# Patient Record
Sex: Female | Born: 1940 | Race: White | Hispanic: No | Marital: Married | Smoking: Never smoker
Health system: Southern US, Community
[De-identification: ages and names within clinical notes are randomized; demographics above are authoritative.]

## PROBLEM LIST (undated history)

## (undated) DIAGNOSIS — I639 Cerebral infarction, unspecified: Secondary | ICD-10-CM

## (undated) DIAGNOSIS — R011 Cardiac murmur, unspecified: Secondary | ICD-10-CM

## (undated) DIAGNOSIS — I2510 Atherosclerotic heart disease of native coronary artery without angina pectoris: Secondary | ICD-10-CM

## (undated) DIAGNOSIS — R0602 Shortness of breath: Secondary | ICD-10-CM

## (undated) DIAGNOSIS — I350 Nonrheumatic aortic (valve) stenosis: Secondary | ICD-10-CM

## (undated) DIAGNOSIS — R2243 Localized swelling, mass and lump, lower limb, bilateral: Secondary | ICD-10-CM

## (undated) DIAGNOSIS — I480 Paroxysmal atrial fibrillation: Secondary | ICD-10-CM

## (undated) DIAGNOSIS — R600 Localized edema: Secondary | ICD-10-CM

## (undated) DIAGNOSIS — R0600 Dyspnea, unspecified: Secondary | ICD-10-CM

## (undated) DIAGNOSIS — I10 Essential (primary) hypertension: Secondary | ICD-10-CM

## (undated) DIAGNOSIS — I447 Left bundle-branch block, unspecified: Secondary | ICD-10-CM

## (undated) DIAGNOSIS — Z9889 Other specified postprocedural states: Secondary | ICD-10-CM

## (undated) DIAGNOSIS — R001 Bradycardia, unspecified: Secondary | ICD-10-CM

## (undated) DIAGNOSIS — R9431 Abnormal electrocardiogram [ECG] [EKG]: Secondary | ICD-10-CM

## (undated) DIAGNOSIS — J309 Allergic rhinitis, unspecified: Secondary | ICD-10-CM

## (undated) DIAGNOSIS — R42 Dizziness and giddiness: Secondary | ICD-10-CM

## (undated) DIAGNOSIS — Z8719 Personal history of other diseases of the digestive system: Secondary | ICD-10-CM

## (undated) DIAGNOSIS — N1832 Chronic kidney disease, stage 3b: Secondary | ICD-10-CM

## (undated) DIAGNOSIS — E039 Hypothyroidism, unspecified: Secondary | ICD-10-CM

## (undated) DIAGNOSIS — I5032 Chronic diastolic (congestive) heart failure: Secondary | ICD-10-CM

## (undated) DIAGNOSIS — R112 Nausea with vomiting, unspecified: Secondary | ICD-10-CM

## (undated) DIAGNOSIS — G90529 Complex regional pain syndrome I of unspecified lower limb: Secondary | ICD-10-CM

## (undated) DIAGNOSIS — E785 Hyperlipidemia, unspecified: Secondary | ICD-10-CM

## (undated) HISTORY — DX: Localized edema: R60.0

## (undated) HISTORY — DX: Essential (primary) hypertension: I10

## (undated) HISTORY — DX: Paroxysmal atrial fibrillation: I48.0

## (undated) HISTORY — DX: Hypothyroidism, unspecified: E03.9

## (undated) HISTORY — DX: Shortness of breath: R06.02

## (undated) HISTORY — DX: Complex regional pain syndrome i of unspecified lower limb: G90.529

## (undated) HISTORY — DX: Abnormal electrocardiogram (ECG) (EKG): R94.31

## (undated) HISTORY — DX: Hyperlipidemia, unspecified: E78.5

## (undated) HISTORY — DX: Nonrheumatic aortic (valve) stenosis: I35.0

## (undated) HISTORY — DX: Localized swelling, mass and lump, lower limb, bilateral: R22.43

## (undated) HISTORY — DX: Allergic rhinitis, unspecified: J30.9

## (undated) HISTORY — DX: Dizziness and giddiness: R42

## (undated) HISTORY — DX: Cardiac murmur, unspecified: R01.1

---

## 2021-05-07 DIAGNOSIS — I340 Nonrheumatic mitral (valve) insufficiency: Secondary | ICD-10-CM

## 2021-05-07 DIAGNOSIS — I361 Nonrheumatic tricuspid (valve) insufficiency: Secondary | ICD-10-CM

## 2021-10-09 ENCOUNTER — Other Ambulatory Visit: Payer: Self-pay

## 2021-10-09 DIAGNOSIS — R011 Cardiac murmur, unspecified: Secondary | ICD-10-CM | POA: Insufficient documentation

## 2021-10-09 DIAGNOSIS — E039 Hypothyroidism, unspecified: Secondary | ICD-10-CM | POA: Insufficient documentation

## 2021-10-09 DIAGNOSIS — R42 Dizziness and giddiness: Secondary | ICD-10-CM | POA: Insufficient documentation

## 2021-10-09 DIAGNOSIS — I480 Paroxysmal atrial fibrillation: Secondary | ICD-10-CM | POA: Insufficient documentation

## 2021-10-09 DIAGNOSIS — R2243 Localized swelling, mass and lump, lower limb, bilateral: Secondary | ICD-10-CM | POA: Insufficient documentation

## 2021-10-09 DIAGNOSIS — J309 Allergic rhinitis, unspecified: Secondary | ICD-10-CM | POA: Insufficient documentation

## 2021-10-09 DIAGNOSIS — R0602 Shortness of breath: Secondary | ICD-10-CM | POA: Insufficient documentation

## 2021-10-09 DIAGNOSIS — G90529 Complex regional pain syndrome I of unspecified lower limb: Secondary | ICD-10-CM | POA: Insufficient documentation

## 2021-10-09 DIAGNOSIS — E785 Hyperlipidemia, unspecified: Secondary | ICD-10-CM | POA: Insufficient documentation

## 2021-10-09 DIAGNOSIS — R600 Localized edema: Secondary | ICD-10-CM | POA: Insufficient documentation

## 2021-10-09 DIAGNOSIS — R9431 Abnormal electrocardiogram [ECG] [EKG]: Secondary | ICD-10-CM | POA: Insufficient documentation

## 2021-10-09 DIAGNOSIS — I10 Essential (primary) hypertension: Secondary | ICD-10-CM | POA: Insufficient documentation

## 2021-10-13 ENCOUNTER — Encounter: Payer: Self-pay | Admitting: Cardiology

## 2021-10-13 ENCOUNTER — Ambulatory Visit: Payer: Medicare PPO | Admitting: Cardiology

## 2021-10-13 VITALS — BP 222/84 | HR 56 | Ht 63.0 in | Wt 195.8 lb

## 2021-10-13 DIAGNOSIS — I10 Essential (primary) hypertension: Secondary | ICD-10-CM | POA: Diagnosis not present

## 2021-10-13 DIAGNOSIS — I350 Nonrheumatic aortic (valve) stenosis: Secondary | ICD-10-CM | POA: Insufficient documentation

## 2021-10-13 DIAGNOSIS — I480 Paroxysmal atrial fibrillation: Secondary | ICD-10-CM

## 2021-10-13 DIAGNOSIS — R0609 Other forms of dyspnea: Secondary | ICD-10-CM | POA: Insufficient documentation

## 2021-10-13 NOTE — Progress Notes (Signed)
?Cardiology Office Note:   ? ?Date:  10/13/2021  ? ?ID:  Rebekah Pope, DOB 1941-05-11, MRN [id number] ? ?PCP:  Dema Severin, NP  ?Cardiologist:  Garwin Brothers, MD  ? ?Referring MD: Eber Jones, NP  ? ? ?ASSESSMENT:   ? ?1. Essential (primary) hypertension   ?2. Paroxysmal atrial fibrillation (HCC)   ?3. Severe aortic stenosis   ?4. Dyspnea on exertion   ? ?PLAN:   ? ?In order of problems listed above: ? ?Primary prevention stressed with patient.  Importance of compliance with diet medication stressed and she vocalized understanding. ?Paroxysmal atrial fibrillation:I discussed with the patient atrial fibrillation, disease process. Management and therapy including rate and rhythm control, anticoagulation benefits and potential risks were discussed extensively with the patient. Patient had multiple questions which were answered to patient's satisfaction.  Patient is on amiodarone therapy.  Benefits and potential risks explained.  I will not change any of that medication at this time because of the fact that if the patient has any atrial fibrillation issues that will be devastating and the patient with significant aortic stenosis as mentioned below. ?Severe aortic stenosis by echocardiogram done last year.  I will recheck this.  We will also then set the patient for valvular heart disease clinic to be assessed for potential aortic valve replacement by either TAVR or conventional method. ?Essential hypertension: Blood pressure stable.  She tells me that she has an element of whitecoat hypertension that her blood pressure at home is fine.  She keeps a track of it regularly and mentioned to me the numbers. ?She will be seen in follow-up appointment after echocardiogram and we will do the needful.  Patient had multiple questions which were answered to her satisfaction.6 ? ?Medication Adjustments/Labs and Tests Ordered: ?Current medicines are reviewed at length with the patient today.  Concerns regarding  medicines are outlined above.  ?No orders of the defined types were placed in this encounter. ? ?No orders of the defined types were placed in this encounter. ? ? ? ?History of Present Illness:   ? ?Rebekah Pope is a 81 y.o. female who is being seen today for the evaluation of dyspnea on exertion, paroxysmal atrial fibrillation and severe aortic stenosis at the request of Eber Jones, NP.  Patient is a pleasant 81 year old female.  She has past medical history of paroxysmal atrial fibrillation and severe aortic stenosis.  She mentions to me that she has no chest pain or dizziness or syncope.  However when she exerts she has shortness of breath.  No orthopnea or PND.  She is here to be established.  She was lost to cardiology follow-up.  At the time of my evaluation, the patient is alert awake oriented and in no distress. ? ?Past Medical History:  ?Diagnosis Date  ? Abnormal electrocardiogram (ECG) (EKG)   ? Allergic rhinitis   ? Cardiac murmur   ? Complex regional pain syndrome i of unspecified lower limb   ? Dizziness and giddiness   ? Essential (primary) hypertension   ? Hyperlipidemia   ? Hypothyroidism   ? Localized edema   ? Localized swelling, mass and lump, lower limb, bilateral   ? Paroxysmal atrial fibrillation (HCC)   ? Shortness of breath   ? ? ?Past Surgical History:  ?Procedure Laterality Date  ? CESAREAN SECTION    ? ? ?Current Medications: ?Current Meds  ?Medication Sig  ? amiodarone (PACERONE) 200 MG tablet Take by mouth. Take 0.5 tablet (100 mg)  by mouth one time daily  ? furosemide (LASIX) 20 MG tablet Take 20 mg by mouth daily.  ? KLOR-CON M10 10 MEQ tablet Take 10 mEq by mouth daily.  ? levothyroxine (SYNTHROID) 112 MCG tablet Take 112 mcg by mouth daily.  ? rivaroxaban (XARELTO) 20 MG TABS tablet Take 20 mg by mouth daily with supper.  ? valsartan (DIOVAN) 40 MG tablet Take 40 mg by mouth daily.  ?  ? ?Allergies:   Patient has no known allergies.  ? ?Social History  ? ?Socioeconomic  History  ? Marital status: Married  ?  Spouse name: Not on file  ? Number of children: Not on file  ? Years of education: Not on file  ? Highest education level: Not on file  ?Occupational History  ? Not on file  ?Tobacco Use  ? Smoking status: Never  ?  Passive exposure: Past  ? Smokeless tobacco: Never  ?Vaping Use  ? Vaping Use: Never used  ?Substance and Sexual Activity  ? Alcohol use: Not on file  ? Drug use: Never  ? Sexual activity: Not on file  ?Other Topics Concern  ? Not on file  ?Social History Narrative  ? Not on file  ? ?Social Determinants of Health  ? ?Financial Resource Strain: Not on file  ?Food Insecurity: Not on file  ?Transportation Needs: Not on file  ?Physical Activity: Not on file  ?Stress: Not on file  ?Social Connections: Not on file  ?  ? ?Family History: ?The patient's family history includes Dementia in her mother; Heart disease in her father; Hip fracture in her mother; Pneumonia in her mother. ? ?ROS:   ?Please see the history of present illness.    ?All other systems reviewed and are negative. ? ?EKGs/Labs/Other Studies Reviewed:   ? ?The following studies were reviewed today: ?EKG reveals sinus rhythm and nonspecific ST-T changes ? ? ?Recent Labs: ?No results found for requested labs within last 8760 hours.  ?Recent Lipid Panel ?No results found for: CHOL, TRIG, HDL, CHOLHDL, VLDL, LDLCALC, LDLDIRECT ? ?Physical Exam:   ? ?VS:  BP (!) 222/84 (BP Location: Right Arm, Patient Position: Sitting)   Pulse (!) 56   Ht 5\' 3"  (1.6 m)   Wt 195 lb 12.8 oz (88.8 kg)   SpO2 97%   BMI 34.68 kg/m?    ? ?Wt Readings from Last 3 Encounters:  ?10/13/21 195 lb 12.8 oz (88.8 kg)  ?  ? ?GEN: Patient is in no acute distress ?HEENT: Normal ?NECK: No JVD; No carotid bruits ?LYMPHATICS: No lymphadenopathy ?CARDIAC: S1 S2 regular, 2/6 systolic murmur at the apex. ?RESPIRATORY:  Clear to auscultation without rales, wheezing or rhonchi  ?ABDOMEN: Soft, non-tender, non-distended ?MUSCULOSKELETAL:  No edema;  No deformity  ?SKIN: Warm and dry ?NEUROLOGIC:  Alert and oriented x 3 ?PSYCHIATRIC:  Normal affect  ? ? ?Signed, ?12/13/21, MD  ?10/13/2021 3:24 PM    ? [facility]   ?

## 2021-10-13 NOTE — Patient Instructions (Signed)
Medication Instructions:  ?Your physician recommends that you continue on your current medications as directed. Please refer to the Current Medication list given to you today. ? ?*If you need a refill on your cardiac medications before your next appointment, please call your pharmacy* ? ? ?Lab Work: ?None ordered ?If you have labs (blood work) drawn today and your tests are completely normal, you will receive your results only by: ?MyChart Message (if you have MyChart) OR ?A paper copy in the mail ?If you have any lab test that is abnormal or we need to change your treatment, we will call you to review the results. ? ? ?Testing/Procedures: ?Your physician has requested that you have an echocardiogram. Echocardiography is a painless test that uses sound waves to create images of your heart. It provides your doctor with information about the size and shape of your heart and how well your heart?s chambers and valves are working. This procedure takes approximately one hour. There are no restrictions for this procedure. ? ? ? ?Follow-Up: ?At [facility], you and your health needs are our priority.  As part of our continuing mission to provide you with exceptional heart care, we have created designated Provider Care Teams.  These Care Teams include your primary Cardiologist (physician) and Advanced Practice Providers (APPs -  Physician Assistants and Nurse Practitioners) who all work together to provide you with the care you need, when you need it. ? ?We recommend signing up for the patient portal called "MyChart".  Sign up information is provided on this After Visit Summary.  MyChart is used to connect with patients for Virtual Visits (Telemedicine).  Patients are able to view lab/test results, encounter notes, upcoming appointments, etc.  Non-urgent messages can be sent to your provider as well.   ?To learn more about what you can do with MyChart, go to [email or web address].   ? ?Your next appointment:   ?As  directed ? ?The format for your next appointment:   ?In Person ? ?Provider:   ?Jyl Heinz, MD ? ? ?Other Instructions ?Echocardiogram ?An echocardiogram is a test that uses sound waves (ultrasound) to produce images of the heart. ?Images from an echocardiogram can provide important information about: ?Heart size and shape. ?The size and thickness and movement of your heart's walls. ?Heart muscle function and strength. ?Heart valve function or if you have stenosis. Stenosis is when the heart valves are too narrow. ?If blood is flowing backward through the heart valves (regurgitation). ?A tumor or infectious growth around the heart valves. ?Areas of heart muscle that are not working well because of poor blood flow or injury from a heart attack. ?Aneurysm detection. An aneurysm is a weak or damaged part of an artery wall. The wall bulges out from the normal force of blood pumping through the body. ?Tell a health care provider about: ?Any allergies you have. ?All medicines you are taking, including vitamins, herbs, eye drops, creams, and over-the-counter medicines. ?Any blood disorders you have. ?Any surgeries you have had. ?Any medical conditions you have. ?Whether you are pregnant or may be pregnant. ?What are the risks? ?Generally, this is a safe test. However, problems may occur, including an allergic reaction to dye (contrast) that may be used during the test. ?What happens before the test? ?No specific preparation is needed. You may eat and drink normally. ?What happens during the test? ?You will take off your clothes from the waist up and put on a hospital gown. ?Electrodes or electrocardiogram (ECG)patches may be placed on  your chest. The electrodes or patches are then connected to a device that monitors your heart rate and rhythm. ?You will lie down on a table for an ultrasound exam. A gel will be applied to your chest to help sound waves pass through your skin. ?A handheld device, called a transducer, will  be pressed against your chest and moved over your heart. The transducer produces sound waves that travel to your heart and bounce back (or "echo" back) to the transducer. These sound waves will be captured in real-time and changed into images of your heart that can be viewed on a video monitor. The images will be recorded on a computer and reviewed by your health care provider. ?You may be asked to change positions or hold your breath for a short time. This makes it easier to get different views or better views of your heart. ?In some cases, you may receive contrast through an IV in one of your veins. This can improve the quality of the pictures from your heart. ?The procedure may vary among health care providers and hospitals.   ?What can I expect after the test? ?You may return to your normal, everyday life, including diet, activities, and medicines, unless your health care provider tells you not to do that. ?Follow these instructions at home: ?It is up to you to get the results of your test. Ask your health care provider, or the department that is doing the test, when your results will be ready. ?Keep all follow-up visits. This is important. ?Summary ?An echocardiogram is a test that uses sound waves (ultrasound) to produce images of the heart. ?Images from an echocardiogram can provide important information about the size and shape of your heart, heart muscle function, heart valve function, and other possible heart problems. ?You do not need to do anything to prepare before this test. You may eat and drink normally. ?After the echocardiogram is completed, you may return to your normal, everyday life, unless your health care provider tells you not to do that. ?This information is not intended to replace advice given to you by your health care provider. Make sure you discuss any questions you have with your health care provider. ?Document Revised: 01/16/2020 Document Reviewed: 01/16/2020 ?Elsevier Patient  Education ? 2021 [employer] ? ? ?

## 2021-10-15 ENCOUNTER — Ambulatory Visit (INDEPENDENT_AMBULATORY_CARE_PROVIDER_SITE_OTHER): Payer: Medicare PPO

## 2021-10-15 DIAGNOSIS — I350 Nonrheumatic aortic (valve) stenosis: Secondary | ICD-10-CM | POA: Diagnosis not present

## 2021-10-15 LAB — ECHOCARDIOGRAM COMPLETE
AR max vel: 0.63 cm2
AV Area VTI: 0.52 cm2
AV Area mean vel: 0.61 cm2
AV Mean grad: 44 mmHg
AV Peak grad: 69.9 mmHg
Ao pk vel: 4.18 m/s
Area-P 1/2: 3.51 cm2
MV M vel: 6.44 m/s
MV Peak grad: 165.9 mmHg
MV VTI: 1.32 cm2
P 1/2 time: 438 msec
Radius: 0.65 cm
S' Lateral: 3.4 cm

## 2021-10-21 NOTE — Progress Notes (Signed)
? ? ?Structural Heart Clinic Consult Note ? ?Chief Complaint  ?Patient presents with  ? New Patient (Initial Visit)  ?  Severe aortic stenosis ?  ? ?History of Present Illness: 81 yo female with history of HTN, hyperlipidemia, hypothyroidism, paroxysmal atrial fibrillation, LBBB and severe aortic stenosis who is here today as a new consult in the structural heart clinic, referred by Dr. Tomie China, for further discussion regarding her aortic stenosis and possible TAVR. She is known to have PAF and is on amiodarone and Xarelto. She has had recent worsened dyspnea on exertion and was seen on 10/13/21 by Dr. Tomie China after being lost to cardiac follow up for several years. Echo 10/15/21 with LVEF=65-70%. Moderate LVH. Mild to moderate mitral regurgitation with mild mitral stenosis. Severe aortic stenosis with mean gradient 44 mmHg, AVA 0.5 cm2, DI 0.16.  ? ?She tells me today that she has been having progressive dyspnea on exertion and fatigue. No dizziness or syncope. Mild LE edema resolved with Lasix. No chest pain.  ?She lives in [place] with her husband. She has dentures with two natural teeth. No active issues. She is retired as a Geophysicist/field seismologist.  ? ? ?Primary Care Physician: Dema Severin, NP ?Primary Cardiologist: Revankar ?Referring Cardiologist: Revankar ? ?Past Medical History:  ?Diagnosis Date  ? Abnormal electrocardiogram (ECG) (EKG)   ? Allergic rhinitis   ? Cardiac murmur   ? Complex regional pain syndrome i of unspecified lower limb   ? Dizziness and giddiness   ? Essential (primary) hypertension   ? Hyperlipidemia   ? Hypothyroidism   ? Localized edema   ? Localized swelling, mass and lump, lower limb, bilateral   ? Paroxysmal atrial fibrillation (HCC)   ? Shortness of breath   ? ? ?Past Surgical History:  ?Procedure Laterality Date  ? CESAREAN SECTION    ? ? ?Current Outpatient Medications  ?Medication Sig Dispense Refill  ? amiodarone (PACERONE) 200 MG tablet Take by mouth. Take 0.5 tablet (100 mg)  by mouth one time daily    ? furosemide (LASIX) 20 MG tablet Take 20 mg by mouth daily.    ? KLOR-CON M10 10 MEQ tablet Take 10 mEq by mouth daily.    ? levothyroxine (SYNTHROID) 112 MCG tablet Take 112 mcg by mouth daily.    ? rivaroxaban (XARELTO) 20 MG TABS tablet Take 20 mg by mouth daily with supper.    ? valsartan (DIOVAN) 40 MG tablet Take 40 mg by mouth daily.    ? ?No current facility-administered medications for this visit.  ? ? ?No Known Allergies ? ?Social History  ? ?Socioeconomic History  ? Marital status: Married  ?  Spouse name: Not on file  ? Number of children: 4  ? Years of education: Not on file  ? Highest education level: Not on file  ?Occupational History  ? Occupation: Animator  ?Tobacco Use  ? Smoking status: Never  ?  Passive exposure: Past  ? Smokeless tobacco: Never  ?Vaping Use  ? Vaping Use: Never used  ?Substance and Sexual Activity  ? Alcohol use: Never  ? Drug use: Never  ? Sexual activity: Not on file  ?Other Topics Concern  ? Not on file  ?Social History Narrative  ? Not on file  ? ?Social Determinants of Health  ? ?Financial Resource Strain: Not on file  ?Food Insecurity: Not on file  ?Transportation Needs: Not on file  ?Physical Activity: Not on file  ?Stress: Not on file  ?Social Connections:  Not on file  ?Intimate Partner Violence: Not on file  ? ? ?Family History  ?Problem Relation Age of Onset  ? Dementia Mother   ? Pneumonia Mother   ? Hip fracture Mother   ? Heart disease Father   ? ? ?Review of Systems:  As stated in the HPI and otherwise negative.  ? ?BP (!) 170/70   Pulse 64   Ht 5\' 3"  (1.6 m)   Wt 192 lb 3.2 oz (87.2 kg)   SpO2 96%   BMI 34.05 kg/m?  ? ?Physical Examination: ?General: Well developed, well nourished, NAD  ?HEENT: OP clear, mucus membranes moist  ?SKIN: warm, dry. No rashes. ?Neuro: No focal deficits  ?Musculoskeletal: Muscle strength 5/5 all ext  ?Psychiatric: Mood and affect normal  ?Neck: No JVD, no carotid bruits, no thyromegaly,  no lymphadenopathy.  ?Lungs:Clear bilaterally, no wheezes, rhonci, crackles ?Cardiovascular: Regular rate and rhythm. Loud, harsh, late peaking systolic murmur.  ?Abdomen:Soft. Bowel sounds present. Non-tender.  ?Extremities: No lower extremity edema. Pulses are 2 + in the bilateral DP/PT. ? ?EKG:  EKG is not ordered today. ?The ekg from 10/13/21 is reviewed today and shows sinus with LBBB.  ? ?Echo 10/15/21: ? 1. Left ventricular ejection fraction, by estimation, is 65 to 70%. The  ?left ventricle has normal function. The left ventricle has no regional  ?wall motion abnormalities. There is moderate left ventricular hypertrophy.  ?Left ventricular diastolic  ?parameters are consistent with Grade II diastolic dysfunction  ?(pseudonormalization).  ? 2. Right ventricular systolic function is normal. The right ventricular  ?size is normal. There is mildly elevated pulmonary artery systolic  ?pressure.  ? 3. Left atrial size was severely dilated.  ? 4. Right atrial size was moderately dilated.  ? 5. The mitral valve is normal in structure. Mild to moderate mitral valve  ?regurgitation. Mild mitral stenosis. The mean mitral valve gradient is 4.0  ?mmHg.  ? 6. DI 0.16, SVi 40. The aortic valve is calcified. Aortic valve  ?regurgitation is mild. Severe aortic valve stenosis. Aortic valve area, by  ?VTI measures 0.52 cm?Marland Kitchen. Aortic valve mean gradient measures 44.0 mmHg.  ? 7. The inferior vena cava is normal in size with greater than 50%  ?respiratory variability, suggesting right atrial pressure of 3 mmHg.  ? ?FINDINGS  ? Left Ventricle: Left ventricular ejection fraction, by estimation, is 65  ?to 70%. The left ventricle has normal function. The left ventricle has no  ?regional wall motion abnormalities. The left ventricular internal cavity  ?size was normal in size. There is  ? moderate left ventricular hypertrophy. Left ventricular diastolic  ?parameters are consistent with Grade II diastolic dysfunction   ?(pseudonormalization).  ? ?Right Ventricle: The right ventricular size is normal. No increase in  ?right ventricular wall thickness. Right ventricular systolic function is  ?normal. There is mildly elevated pulmonary artery systolic pressure. The  ?tricuspid regurgitant velocity is 2.91  ? m/s, and with an assumed right atrial pressure of 8 mmHg, the estimated  ?right ventricular systolic pressure is 41.9 mmHg.  ? ?Left Atrium: Left atrial size was severely dilated.  ? ?Right Atrium: Right atrial size was moderately dilated.  ? ?Pericardium: There is no evidence of pericardial effusion.  ? ?Mitral Valve: The mitral valve is normal in structure. There is mild  ?thickening of the mitral valve leaflet(s). There is moderate calcification  ?of the mitral valve leaflet(s). Mild mitral annular calcification. Mild to  ?moderate mitral valve  ?regurgitation. Mild mitral valve stenosis. MV  peak gradient, 12.5 mmHg.  ?The mean mitral valve gradient is 4.0 mmHg.  ? ?Tricuspid Valve: The tricuspid valve is normal in structure. Tricuspid  ?valve regurgitation is not demonstrated. No evidence of tricuspid  ?stenosis.  ? ?Aortic Valve: DI 0.16, SVi 40. The aortic valve is calcified. Aortic valve  ?regurgitation is mild. Aortic regurgitation PHT measures 438 msec. Severe  ?aortic stenosis is present. Aortic valve mean gradient measures 44.0 mmHg.  ?Aortic valve peak gradient  ?measures 69.9 mmHg. Aortic valve area, by VTI measures 0.52 cm?.  ? ?Pulmonic Valve: The pulmonic valve was normal in structure. Pulmonic valve  ?regurgitation is not visualized. No evidence of pulmonic stenosis.  ? ?Aorta: The aortic root is normal in size and structure.  ? ?Venous: The inferior vena cava is normal in size with greater than 50%  ?respiratory variability, suggesting right atrial pressure of 3 mmHg.  ? ?IAS/Shunts: No atrial level shunt detected by color flow Doppler.  ? ?   ?LEFT VENTRICLE  ?PLAX 2D  ?LVIDd:         5.20 cm   Diastology   ?LVIDs:         3.40 cm   LV e' medial:    3.87 cm/s  ?LV PW:         1.50 cm   LV E/e' medial:  43.9  ?LV IVS:        1.50 cm   LV e' lateral:   4.75 cm/s  ?LVOT diam:     2.00 cm   LV E/e' lateral: 35.8  ?LV SV:         76  ?L

## 2021-10-21 NOTE — H&P (View-Only) (Signed)
Structural Heart Clinic Consult Note  Chief Complaint  Patient presents with   New Patient (Initial Visit)    Severe aortic stenosis    History of Present Illness: 81 yo female with history of HTN, hyperlipidemia, hypothyroidism, paroxysmal atrial fibrillation, LBBB and severe aortic stenosis who is here today as a new consult in the structural heart clinic, referred by Dr. Tomie China, for further discussion regarding her aortic stenosis and possible TAVR. She is known to have PAF and is on amiodarone and Xarelto. She has had recent worsened dyspnea on exertion and was seen on 10/13/21 by Dr. Tomie China after being lost to cardiac follow up for several years. Echo 10/15/21 with LVEF=65-70%. Moderate LVH. Mild to moderate mitral regurgitation with mild mitral stenosis. Severe aortic stenosis with mean gradient 44 mmHg, AVA 0.5 cm2, DI 0.16.   She tells me today that she has been having progressive dyspnea on exertion and fatigue. No dizziness or syncope. Mild LE edema resolved with Lasix. No chest pain.  She lives in [place] with her husband. She has dentures with two natural teeth. No active issues. She is retired as a Geophysicist/field seismologist.    Primary Care Physician: Dema Severin, NP Primary Cardiologist: Revankar Referring Cardiologist: Revankar  Past Medical History:  Diagnosis Date   Abnormal electrocardiogram (ECG) (EKG)    Allergic rhinitis    Cardiac murmur    Complex regional pain syndrome i of unspecified lower limb    Dizziness and giddiness    Essential (primary) hypertension    Hyperlipidemia    Hypothyroidism    Localized edema    Localized swelling, mass and lump, lower limb, bilateral    Paroxysmal atrial fibrillation (HCC)    Shortness of breath     Past Surgical History:  Procedure Laterality Date   CESAREAN SECTION      Current Outpatient Medications  Medication Sig Dispense Refill   amiodarone (PACERONE) 200 MG tablet Take by mouth. Take 0.5 tablet (100 mg)  by mouth one time daily     furosemide (LASIX) 20 MG tablet Take 20 mg by mouth daily.     KLOR-CON M10 10 MEQ tablet Take 10 mEq by mouth daily.     levothyroxine (SYNTHROID) 112 MCG tablet Take 112 mcg by mouth daily.     rivaroxaban (XARELTO) 20 MG TABS tablet Take 20 mg by mouth daily with supper.     valsartan (DIOVAN) 40 MG tablet Take 40 mg by mouth daily.     No current facility-administered medications for this visit.    No Known Allergies  Social History   Socioeconomic History   Marital status: Married    Spouse name: Not on file   Number of children: 4   Years of education: Not on file   Highest education level: Not on file  Occupational History   Occupation: Animator  Tobacco Use   Smoking status: Never    Passive exposure: Past   Smokeless tobacco: Never  Vaping Use   Vaping Use: Never used  Substance and Sexual Activity   Alcohol use: Never   Drug use: Never   Sexual activity: Not on file  Other Topics Concern   Not on file  Social History Narrative   Not on file   Social Determinants of Health   Financial Resource Strain: Not on file  Food Insecurity: Not on file  Transportation Needs: Not on file  Physical Activity: Not on file  Stress: Not on file  Social Connections:  Not on file  Intimate Partner Violence: Not on file    Family History  Problem Relation Age of Onset   Dementia Mother    Pneumonia Mother    Hip fracture Mother    Heart disease Father     Review of Systems:  As stated in the HPI and otherwise negative.   BP (!) 170/70   Pulse 64   Ht 5\' 3"  (1.6 m)   Wt 192 lb 3.2 oz (87.2 kg)   SpO2 96%   BMI 34.05 kg/m   Physical Examination: General: Well developed, well nourished, NAD  HEENT: OP clear, mucus membranes moist  SKIN: warm, dry. No rashes. Neuro: No focal deficits  Musculoskeletal: Muscle strength 5/5 all ext  Psychiatric: Mood and affect normal  Neck: No JVD, no carotid bruits, no thyromegaly,  no lymphadenopathy.  Lungs:Clear bilaterally, no wheezes, rhonci, crackles Cardiovascular: Regular rate and rhythm. Loud, harsh, late peaking systolic murmur.  Abdomen:Soft. Bowel sounds present. Non-tender.  Extremities: No lower extremity edema. Pulses are 2 + in the bilateral DP/PT.  EKG:  EKG is not ordered today. The ekg from 10/13/21 is reviewed today and shows sinus with LBBB.   Echo 10/15/21:  1. Left ventricular ejection fraction, by estimation, is 65 to 70%. The  left ventricle has normal function. The left ventricle has no regional  wall motion abnormalities. There is moderate left ventricular hypertrophy.  Left ventricular diastolic  parameters are consistent with Grade II diastolic dysfunction  (pseudonormalization).   2. Right ventricular systolic function is normal. The right ventricular  size is normal. There is mildly elevated pulmonary artery systolic  pressure.   3. Left atrial size was severely dilated.   4. Right atrial size was moderately dilated.   5. The mitral valve is normal in structure. Mild to moderate mitral valve  regurgitation. Mild mitral stenosis. The mean mitral valve gradient is 4.0  mmHg.   6. DI 0.16, SVi 40. The aortic valve is calcified. Aortic valve  regurgitation is mild. Severe aortic valve stenosis. Aortic valve area, by  VTI measures 0.52 cm. Aortic valve mean gradient measures 44.0 mmHg.   7. The inferior vena cava is normal in size with greater than 50%  respiratory variability, suggesting right atrial pressure of 3 mmHg.   FINDINGS   Left Ventricle: Left ventricular ejection fraction, by estimation, is 65  to 70%. The left ventricle has normal function. The left ventricle has no  regional wall motion abnormalities. The left ventricular internal cavity  size was normal in size. There is   moderate left ventricular hypertrophy. Left ventricular diastolic  parameters are consistent with Grade II diastolic dysfunction   (pseudonormalization).   Right Ventricle: The right ventricular size is normal. No increase in  right ventricular wall thickness. Right ventricular systolic function is  normal. There is mildly elevated pulmonary artery systolic pressure. The  tricuspid regurgitant velocity is 2.91   m/s, and with an assumed right atrial pressure of 8 mmHg, the estimated  right ventricular systolic pressure is 41.9 mmHg.   Left Atrium: Left atrial size was severely dilated.   Right Atrium: Right atrial size was moderately dilated.   Pericardium: There is no evidence of pericardial effusion.   Mitral Valve: The mitral valve is normal in structure. There is mild  thickening of the mitral valve leaflet(s). There is moderate calcification  of the mitral valve leaflet(s). Mild mitral annular calcification. Mild to  moderate mitral valve  regurgitation. Mild mitral valve stenosis. MV  peak gradient, 12.5 mmHg.  The mean mitral valve gradient is 4.0 mmHg.   Tricuspid Valve: The tricuspid valve is normal in structure. Tricuspid  valve regurgitation is not demonstrated. No evidence of tricuspid  stenosis.   Aortic Valve: DI 0.16, SVi 40. The aortic valve is calcified. Aortic valve  regurgitation is mild. Aortic regurgitation PHT measures 438 msec. Severe  aortic stenosis is present. Aortic valve mean gradient measures 44.0 mmHg.  Aortic valve peak gradient  measures 69.9 mmHg. Aortic valve area, by VTI measures 0.52 cm.   Pulmonic Valve: The pulmonic valve was normal in structure. Pulmonic valve  regurgitation is not visualized. No evidence of pulmonic stenosis.   Aorta: The aortic root is normal in size and structure.   Venous: The inferior vena cava is normal in size with greater than 50%  respiratory variability, suggesting right atrial pressure of 3 mmHg.   IAS/Shunts: No atrial level shunt detected by color flow Doppler.      LEFT VENTRICLE  PLAX 2D  LVIDd:         5.20 cm   Diastology   LVIDs:         3.40 cm   LV e' medial:    3.87 cm/s  LV PW:         1.50 cm   LV E/e' medial:  43.9  LV IVS:        1.50 cm   LV e' lateral:   4.75 cm/s  LVOT diam:     2.00 cm   LV E/e' lateral: 35.8  LV SV:         76  LV SV Index:   40  LVOT Area:     3.14 cm      RIGHT VENTRICLE            IVC  RV S prime:     9.36 cm/s  IVC diam: 2.30 cm  TAPSE (M-mode): 4.0 cm   LEFT ATRIUM              Index        RIGHT ATRIUM           Index  LA diam:        4.30 cm  2.24 cm/m   RA Area:     23.70 cm  LA Vol (A2C):   104.0 ml 54.27 ml/m  RA Volume:   77.10 ml  40.23 ml/m  LA Vol (A4C):   80.2 ml  41.85 ml/m  LA Biplane Vol: 93.0 ml  48.53 ml/m   AORTIC VALVE  AV Area (Vmax):    0.63 cm  AV Area (Vmean):   0.61 cm  AV Area (VTI):     0.52 cm  AV Vmax:           418.00 cm/s  AV Vmean:          308.000 cm/s  AV VTI:            1.470 m  AV Peak Grad:      69.9 mmHg  AV Mean Grad:      44.0 mmHg  LVOT Vmax:         83.50 cm/s  LVOT Vmean:        59.900 cm/s  LVOT VTI:          0.242 m  LVOT/AV VTI ratio: 0.16  AI PHT:            438 msec  AORTA  Ao Root diam: 3.50 cm  Ao Asc diam:  3.70 cm  Ao Desc diam: 2.20 cm   MITRAL VALVE                  TRICUSPID VALVE  MV Area (PHT): 3.51 cm       TR Peak grad:   33.9 mmHg  MV Area VTI:   1.32 cm       TR Vmax:        291.00 cm/s  MV Peak grad:  12.5 mmHg  MV Mean grad:  4.0 mmHg       SHUNTS  MV Vmax:       1.77 m/s       Systemic VTI:  0.24 m  MV Vmean:      86.8 cm/s      Systemic Diam: 2.00 cm  MV Decel Time: 216 msec  MR Peak grad:    165.9 mmHg  MR Mean grad:    99.0 mmHg  MR Vmax:         644.00 cm/s  MR Vmean:        469.0 cm/s  MR PISA:         2.65 cm  MR PISA Eff ROA: 16 mm  MR PISA Radius:  0.65 cm  MV E velocity: 170.00 cm/s  MV A velocity: 73.10 cm/s  MV E/A ratio:  2.33   Recent Labs: No results found for requested labs within last 8760 hours.   Lipid Panel No results found for: CHOL, TRIG,  HDL, CHOLHDL, VLDL, LDLCALC, LDLDIRECT   Wt Readings from Last 3 Encounters:  10/22/21 192 lb 3.2 oz (87.2 kg)  10/13/21 195 lb 12.8 oz (88.8 kg)     Assessment and Plan:   1. Severe Aortic Valve Stenosis: She has severe, stage D aortic valve stenosis.NYHA class II.  I have personally reviewed the echo images. The aortic valve is thickened, calcified with limited leaflet mobility. I think she would benefit from AVR. Given advanced age, she is not a good candidate for conventional AVR by surgical approach. I think she may be a good candidate for TAVR.   I have reviewed the natural history of aortic stenosis with the patient and their family members  who are present today. We have discussed the limitations of medical therapy and the poor prognosis associated with symptomatic aortic stenosis. We have reviewed potential treatment options, including palliative medical therapy, conventional surgical aortic valve replacement, and transcatheter aortic valve replacement. We discussed treatment options in the context of the patient's specific comorbid medical conditions.   She would like to proceed with planning for TAVR. I will arrange a right and left heart catheterization at [facility] 10/31/21 at 9am. Risks and benefits of the cath procedure and the valve procedure are reviewed with the patient. After the cath, she will have a cardiac CT, CTA of the chest/abdomen and pelvis and will be referred to see Dr. Laneta Simmers.    Labs/ tests ordered today include:   Orders Placed This Encounter  Procedures   CBC   Basic metabolic panel     Disposition:   F/U with the valve team.    Signed, Verne Carrow, MD 10/22/2021 1:07 PM    [facility] [address] [place], [place], [place]  [address] Phone: [phone]; Fax: [phone]

## 2021-10-22 ENCOUNTER — Encounter: Payer: Self-pay | Admitting: Cardiovascular Disease

## 2021-10-22 ENCOUNTER — Ambulatory Visit: Payer: Medicare PPO | Admitting: Cardiovascular Disease

## 2021-10-22 VITALS — BP 170/70 | HR 64 | Ht 63.0 in | Wt 192.2 lb

## 2021-10-22 DIAGNOSIS — I350 Nonrheumatic aortic (valve) stenosis: Secondary | ICD-10-CM | POA: Diagnosis not present

## 2021-10-22 DIAGNOSIS — Z01812 Encounter for preprocedural laboratory examination: Secondary | ICD-10-CM | POA: Diagnosis not present

## 2021-10-22 LAB — CBC
Hematocrit: 39.5 % (ref 34.0–46.6)
Hemoglobin: 13.2 g/dL (ref 11.1–15.9)
MCH: 28.8 pg (ref 26.6–33.0)
MCHC: 33.4 g/dL (ref 31.5–35.7)
MCV: 86 fL (ref 79–97)
Platelets: 281 10*3/uL (ref 150–450)
RBC: 4.58 x10E6/uL (ref 3.77–5.28)
RDW: 14.5 % (ref 11.7–15.4)
WBC: 6 10*3/uL (ref 3.4–10.8)

## 2021-10-22 LAB — BASIC METABOLIC PANEL
BUN/Creatinine Ratio: 18 (ref 12–28)
BUN: 21 mg/dL (ref 8–27)
CO2: 28 mmol/L (ref 20–29)
Calcium: 9.6 mg/dL (ref 8.7–10.3)
Chloride: 102 mmol/L (ref 96–106)
Creatinine, Ser: 1.15 mg/dL — ABNORMAL HIGH (ref 0.57–1.00)
Glucose: 103 mg/dL — ABNORMAL HIGH (ref 70–99)
Potassium: 4.8 mmol/L (ref 3.5–5.2)
Sodium: 139 mmol/L (ref 134–144)
eGFR: 47 mL/min/{1.73_m2} — ABNORMAL LOW (ref >59)

## 2021-10-22 NOTE — Progress Notes (Addendum)
Pre Surgical Assessment: 5 M Walk Test  31M=16.71ft  5 Meter Walk Test- trial 1: 8.13 seconds 5 Meter Walk Test- trial 2: 7.55 seconds 5 Meter Walk Test- trial 3: 6.99 seconds 5 Meter Walk Test Average: 7.56 seconds    Procedure Type: Isolated AVR  Operative Mortality 3.69% Morbidity & Mortality 8.53% Stroke 0.855% Renal Failure 1.39% Reoperation 2.55% Prolonged Ventilation 4.7% Deep Sternal Wound Infection 0.084% Long Hospital Stay (>14 days) 5.42% Short Hospital Stay (<6 days)* 28.2%

## 2021-10-22 NOTE — Patient Instructions (Addendum)
Medication Instructions:  ?No changes ?*If you need a refill on your cardiac medications before your next appointment, please call your pharmacy* ? ? ?Lab Work: ?TODAY. Bmet/cbc ? ? ?Testing/Procedures: ?Your physician has requested that you have a cardiac catheterization. Cardiac catheterization is used to diagnose and/or treat various heart conditions. Doctors may recommend this procedure for a number of different reasons. The most common reason is to evaluate chest pain. Chest pain can be a symptom of coronary artery disease (CAD), and cardiac catheterization can show whether plaque is narrowing or blocking your heart?s arteries. This procedure is also used to evaluate the valves, as well as measure the blood flow and oxygen levels in different parts of your heart. For further information please visit [email or web address] Please follow instruction sheet, as given. ? ? ?Follow-Up: ?Per Structural Heart Team ? ? ?Other Instructions ? ?[facility] ?[facility] [address] [facility] ?[address], [address] ?[place] [address] ?Dept: [phone] ?Loc: [phone] ? ?Rebekah Pope  10/22/2021 ? ?You are scheduled for a Cardiac Catheterization on Friday, May 26 with Dr. Verne Carrow. ? ?1. Please arrive at the Main Entrance A at [facility]: [address] [place], [place] [address] at 7:00 AM (This time is two hours before your procedure to ensure your preparation). Free valet parking service is available.  ? ?Special note: Every effort is made to have your procedure done on time. Please understand that emergencies sometimes delay scheduled procedures. ? ?2. Diet: Do not eat solid foods after midnight.  You may have clear liquids until 5 AM upon the day of the procedure. ? ?3. Labs: You will need to have blood drawn today. You do not need to be fasting. ? ?4. Medication instructions in preparation for your procedure: ? ? Contrast Allergy:  No ? ? ?Do not take XARELTO after Tuesday 10/28/21. ? ?Do not take Lasix day of procedure (10/31/21) ? ?On the morning of your procedure, take Aspirin and any morning medicines NOT listed above.  You may use sips of water. ? ?5. Plan to go home the same day, you will only stay overnight if medically necessary. ?6. You MUST have a responsible adult to drive you home. ?7. An adult MUST be with you the first 24 hours after you arrive home. ?8. Bring a current list of your medications, and the last time and date medication taken. ?9. Bring ID and current insurance cards. ?10.Please wear clothes that are easy to get on and off and wear slip-on shoes. ? ?Thank you for allowing [name] to care for you! ?  -- [facility] ? ? ?

## 2021-10-23 ENCOUNTER — Other Ambulatory Visit: Payer: Self-pay | Admitting: Physician Assistant

## 2021-10-28 ENCOUNTER — Ambulatory Visit: Payer: Medicare PPO | Admitting: Cardiology

## 2021-10-29 ENCOUNTER — Other Ambulatory Visit: Payer: Self-pay

## 2021-10-29 DIAGNOSIS — I350 Nonrheumatic aortic (valve) stenosis: Secondary | ICD-10-CM

## 2021-10-30 ENCOUNTER — Telehealth: Payer: Self-pay | Admitting: *Deleted

## 2021-10-30 NOTE — Telephone Encounter (Addendum)
Cardiac Catheterization scheduled at [facility] for: Friday Oct 31, 2021 9 AM Arrival time and place: [facility] Main Entrance A at: 7 AM   Nothing to eat after midnight prior to procedure, clear liquids until 5 AM day of procedure.  Medication instructions: -Hold:  Xarelto-none 10/29/21 until post procedure  Lasix/KCl/Valsartan-day before and day of procedure-per protocol GFR 47 -Except hold medications usual morning medications can be taken with sips of water including aspirin 81 mg.  Confirmed patient has responsible adult to drive home post procedure and be with patient first 24 hours after arriving home.  Patient reports no new symptoms concerning for COVID-19/no exposure to COVID-19 in the past 10 days.  Reviewed procedure instructions with patient.

## 2021-10-31 ENCOUNTER — Encounter (HOSPITAL_COMMUNITY): Payer: Self-pay | Admitting: Cardiovascular Disease

## 2021-10-31 ENCOUNTER — Other Ambulatory Visit: Payer: Self-pay

## 2021-10-31 ENCOUNTER — Ambulatory Visit (HOSPITAL_COMMUNITY)
Admission: RE | Admit: 2021-10-31 | Discharge: 2021-10-31 | Disposition: A | Discharge status: Home / Self Care | Payer: Medicare PPO | Attending: Cardiovascular Disease | Admitting: Cardiovascular Disease

## 2021-10-31 ENCOUNTER — Encounter (HOSPITAL_COMMUNITY)
Admission: RE | Disposition: A | Discharge status: Home / Self Care | Payer: Medicare PPO | Source: Home / Self Care | Attending: Cardiovascular Disease

## 2021-10-31 DIAGNOSIS — I2510 Atherosclerotic heart disease of native coronary artery without angina pectoris: Secondary | ICD-10-CM | POA: Insufficient documentation

## 2021-10-31 DIAGNOSIS — I272 Pulmonary hypertension, unspecified: Secondary | ICD-10-CM | POA: Diagnosis not present

## 2021-10-31 DIAGNOSIS — I10 Essential (primary) hypertension: Secondary | ICD-10-CM | POA: Diagnosis not present

## 2021-10-31 DIAGNOSIS — E039 Hypothyroidism, unspecified: Secondary | ICD-10-CM | POA: Insufficient documentation

## 2021-10-31 DIAGNOSIS — R0609 Other forms of dyspnea: Secondary | ICD-10-CM | POA: Insufficient documentation

## 2021-10-31 DIAGNOSIS — I350 Nonrheumatic aortic (valve) stenosis: Secondary | ICD-10-CM

## 2021-10-31 DIAGNOSIS — I480 Paroxysmal atrial fibrillation: Secondary | ICD-10-CM | POA: Insufficient documentation

## 2021-10-31 DIAGNOSIS — E785 Hyperlipidemia, unspecified: Secondary | ICD-10-CM | POA: Diagnosis not present

## 2021-10-31 DIAGNOSIS — Z7901 Long term (current) use of anticoagulants: Secondary | ICD-10-CM | POA: Insufficient documentation

## 2021-10-31 HISTORY — PX: RIGHT HEART CATH AND CORONARY ANGIOGRAPHY: CATH118264

## 2021-10-31 LAB — POCT I-STAT EG7
Acid-base deficit: 3 mmol/L — ABNORMAL HIGH (ref 0.0–2.0)
Bicarbonate: 21.6 mmol/L (ref 20.0–28.0)
Calcium, Ion: 0.88 mmol/L — CL (ref 1.15–1.40)
HCT: 32 % — ABNORMAL LOW (ref 36.0–46.0)
Hemoglobin: 10.9 g/dL — ABNORMAL LOW (ref 12.0–15.0)
O2 Saturation: 75 %
Potassium: 3 mmol/L — ABNORMAL LOW (ref 3.5–5.1)
Sodium: 147 mmol/L — ABNORMAL HIGH (ref 135–145)
TCO2: 23 mmol/L (ref 22–32)
pCO2, Ven: 36.5 mmHg — ABNORMAL LOW (ref 44–60)
pH, Ven: 7.38 (ref 7.25–7.43)
pO2, Ven: 41 mmHg (ref 32–45)

## 2021-10-31 LAB — POCT I-STAT 7, (LYTES, BLD GAS, ICA,H+H)
Acid-base deficit: 1 mmol/L (ref 0.0–2.0)
Bicarbonate: 23.7 mmol/L (ref 20.0–28.0)
Calcium, Ion: 1.19 mmol/L (ref 1.15–1.40)
HCT: 36 % (ref 36.0–46.0)
Hemoglobin: 12.2 g/dL (ref 12.0–15.0)
O2 Saturation: 97 %
Potassium: 3.8 mmol/L (ref 3.5–5.1)
Sodium: 141 mmol/L (ref 135–145)
TCO2: 25 mmol/L (ref 22–32)
pCO2 arterial: 38 mmHg (ref 32–48)
pH, Arterial: 7.403 (ref 7.35–7.45)
pO2, Arterial: 88 mmHg (ref 83–108)

## 2021-10-31 SURGERY — RIGHT HEART CATH AND CORONARY ANGIOGRAPHY
Anesthesia: LOCAL

## 2021-10-31 MED ORDER — HYDRALAZINE HCL 20 MG/ML IJ SOLN
10.0000 mg | Freq: Once | INTRAMUSCULAR | Status: AC
  Administered 2021-10-31: 10 mg via INTRAVENOUS

## 2021-10-31 MED ORDER — ASPIRIN 81 MG PO CHEW
81.0000 mg | CHEWABLE_TABLET | ORAL | Status: DC
  Filled 2021-10-31: qty 1

## 2021-10-31 MED ORDER — MIDAZOLAM HCL 2 MG/2ML IJ SOLN
INTRAMUSCULAR | Status: AC
  Filled 2021-10-31: qty 2

## 2021-10-31 MED ORDER — HEPARIN (PORCINE) IN NACL 1000-0.9 UT/500ML-% IV SOLN
INTRAVENOUS | Status: DC | PRN
  Administered 2021-10-31 (×2): 500 mL

## 2021-10-31 MED ORDER — MIDAZOLAM HCL 2 MG/2ML IJ SOLN
INTRAMUSCULAR | Status: DC | PRN
  Administered 2021-10-31: 1 mg via INTRAVENOUS

## 2021-10-31 MED ORDER — ASPIRIN 81 MG PO CHEW: 81.0000 mg | CHEWABLE_TABLET | ORAL | Status: DC

## 2021-10-31 MED ORDER — SODIUM CHLORIDE 0.9 % WEIGHT BASED INFUSION
3.0000 mL/kg/h | INTRAVENOUS | Status: AC
  Administered 2021-10-31: 3 mL/kg/h via INTRAVENOUS

## 2021-10-31 MED ORDER — ACETAMINOPHEN 325 MG PO TABS: 650.0000 mg | ORAL_TABLET | ORAL | Status: DC | PRN

## 2021-10-31 MED ORDER — SODIUM CHLORIDE 0.9% FLUSH: 3.0000 mL | INTRAVENOUS | Status: DC | PRN

## 2021-10-31 MED ORDER — HEPARIN SODIUM (PORCINE) 1000 UNIT/ML IJ SOLN
INTRAMUSCULAR | Status: AC
  Filled 2021-10-31: qty 10

## 2021-10-31 MED ORDER — LIDOCAINE HCL (PF) 1 % IJ SOLN
INTRAMUSCULAR | Status: DC | PRN
  Administered 2021-10-31 (×2): 2 mL

## 2021-10-31 MED ORDER — SODIUM CHLORIDE 0.9 % IV SOLN: 250.0000 mL | INTRAVENOUS | Status: DC | PRN

## 2021-10-31 MED ORDER — SODIUM CHLORIDE 0.9 % IV SOLN: INTRAVENOUS | Status: AC

## 2021-10-31 MED ORDER — SODIUM CHLORIDE 0.9 % WEIGHT BASED INFUSION: 1.0000 mL/kg/h | INTRAVENOUS | Status: DC

## 2021-10-31 MED ORDER — LABETALOL HCL 5 MG/ML IV SOLN: 10.0000 mg | INTRAVENOUS | Status: DC | PRN

## 2021-10-31 MED ORDER — SODIUM CHLORIDE 0.9% FLUSH: 3.0000 mL | Freq: Two times a day (BID) | INTRAVENOUS | Status: DC

## 2021-10-31 MED ORDER — VERAPAMIL HCL 2.5 MG/ML IV SOLN
INTRAVENOUS | Status: DC | PRN
  Administered 2021-10-31: 10 mL via INTRA_ARTERIAL

## 2021-10-31 MED ORDER — LIDOCAINE HCL (PF) 1 % IJ SOLN
INTRAMUSCULAR | Status: AC
  Filled 2021-10-31: qty 30

## 2021-10-31 MED ORDER — FENTANYL CITRATE (PF) 100 MCG/2ML IJ SOLN
INTRAMUSCULAR | Status: AC
  Filled 2021-10-31: qty 2

## 2021-10-31 MED ORDER — HYDRALAZINE HCL 20 MG/ML IJ SOLN
INTRAMUSCULAR | Status: AC
  Filled 2021-10-31: qty 1

## 2021-10-31 MED ORDER — HEPARIN SODIUM (PORCINE) 1000 UNIT/ML IJ SOLN
INTRAMUSCULAR | Status: DC | PRN
  Administered 2021-10-31: 4500 [IU] via INTRAVENOUS

## 2021-10-31 MED ORDER — HEPARIN (PORCINE) IN NACL 1000-0.9 UT/500ML-% IV SOLN
INTRAVENOUS | Status: AC
  Filled 2021-10-31: qty 1000

## 2021-10-31 MED ORDER — HYDRALAZINE HCL 20 MG/ML IJ SOLN: 10.0000 mg | INTRAMUSCULAR | Status: DC | PRN

## 2021-10-31 MED ORDER — ONDANSETRON HCL 4 MG/2ML IJ SOLN: 4.0000 mg | Freq: Four times a day (QID) | INTRAMUSCULAR | Status: DC | PRN

## 2021-10-31 MED ORDER — VERAPAMIL HCL 2.5 MG/ML IV SOLN
INTRAVENOUS | Status: AC
  Filled 2021-10-31: qty 2

## 2021-10-31 MED ORDER — FENTANYL CITRATE (PF) 100 MCG/2ML IJ SOLN
INTRAMUSCULAR | Status: DC | PRN
  Administered 2021-10-31: 25 ug via INTRAVENOUS

## 2021-10-31 MED ORDER — IOHEXOL 350 MG/ML SOLN
INTRAVENOUS | Status: DC | PRN
  Administered 2021-10-31: 80 mL

## 2021-10-31 SURGICAL SUPPLY — 13 items
BAND ZEPHYR COMPRESS 30 LONG (HEMOSTASIS) ×1 IMPLANT
CATH 5FR JL3.5 JR4 ANG PIG MP (CATHETERS) ×1 IMPLANT
CATH BALLN WEDGE 5F 110CM (CATHETERS) ×1 IMPLANT
CATH INFINITI 5 FR AR2 MOD (CATHETERS) ×1 IMPLANT
GLIDESHEATH SLEND SS 6F .021 (SHEATH) ×1 IMPLANT
GUIDEWIRE .025 260CM (WIRE) ×1 IMPLANT
GUIDEWIRE INQWIRE 1.5J.035X260 (WIRE) IMPLANT
INQWIRE 1.5J .035X260CM (WIRE) ×2
KIT HEART LEFT (KITS) ×2 IMPLANT
PACK CARDIAC CATHETERIZATION (CUSTOM PROCEDURE TRAY) ×2 IMPLANT
SHEATH GLIDE SLENDER 4/5FR (SHEATH) ×1 IMPLANT
TRANSDUCER W/STOPCOCK (MISCELLANEOUS) ×2 IMPLANT
TUBING CIL FLEX 10 FLL-RA (TUBING) ×2 IMPLANT

## 2021-10-31 NOTE — Progress Notes (Signed)
Patient and husband was given discharge instructions. Both verbalized understanding. 

## 2021-10-31 NOTE — Discharge Instructions (Addendum)
Increase Lasix to 40 mg every day.   Drink plenty of fluid for 48 hours and keep wrist elevated at heart level for 24 hours  Radial Site Care   This sheet gives you information about how to care for yourself after your procedure. Your health care provider may also give you more specific instructions. If you have problems or questions, contact your health care provider. What can I expect after the procedure? After the procedure, it is common to have: Bruising and tenderness at the catheter insertion area. Follow these instructions at home: Medicines Take over-the-counter and prescription medicines only as told by your health care provider. Insertion site care Follow instructions from your health care provider about how to take care of your insertion site. Make sure you: Wash your hands with soap and water before you change your bandage (dressing). If soap and water are not available, use hand sanitizer. remove your dressing as told by your health care provider. In 24 hours Check your insertion site every day for signs of infection. Check for: Redness, swelling, or pain. Fluid or blood. Pus or a bad smell. Warmth. Do not take baths, swim, or use a hot tub until your health care provider approves. You may shower 24-48 hours after the procedure, or as directed by your health care provider. Remove the dressing and gently wash the site with plain soap and water. Pat the area dry with a clean towel. Do not rub the site. That could cause bleeding. Do not apply powder or lotion to the site. Activity   For 24 hours after the procedure, or as directed by your health care provider: Do not flex or bend the affected arm. Do not push or pull heavy objects with the affected arm. Do not drive yourself home from the hospital or clinic. You may drive 24 hours after the procedure unless your health care provider tells you not to. Do not operate machinery or power tools. Do not lift anything that is  heavier than 10 lb (4.5 kg), or the limit that you are told, until your health care provider says that it is safe. For 4 days Ask your health care provider when it is okay to: Return to work or school. Resume usual physical activities or sports. Resume sexual activity. General instructions If the catheter site starts to bleed, raise your arm and put firm pressure on the site. If the bleeding does not stop, get help right away. This is a medical emergency. If you went home on the same day as your procedure, a responsible adult should be with you for the first 24 hours after you arrive home. Keep all follow-up visits as told by your health care provider. This is important. Contact a health care provider if: You have a fever. You have redness, swelling, or yellow drainage around your insertion site. Get help right away if: You have unusual pain at the radial site. The catheter insertion area swells very fast. The insertion area is bleeding, and the bleeding does not stop when you hold steady pressure on the area. Your arm or hand becomes pale, cool, tingly, or numb. These symptoms may represent a serious problem that is an emergency. Do not wait to see if the symptoms will go away. Get medical help right away. Call your local emergency services (911 in the [place] Do not drive yourself to the hospital. Summary After the procedure, it is common to have bruising and tenderness at the site. Follow instructions from your health care  provider about how to take care of your radial site wound. Check the wound every day for signs of infection. Do not lift anything that is heavier than 10 lb (4.5 kg), or the limit that you are told, until your health care provider says that it is safe. This information is not intended to replace advice given to you by your health care provider. Make sure you discuss any questions you have with your health care provider. Document Revised: 06/30/2017 Document Reviewed:  06/30/2017 Elsevier Patient Education  2020 [employer] on 11/01/21 if no bleeding from cath locations

## 2021-10-31 NOTE — Progress Notes (Signed)
Patient arrived to short stay with left hand wrapped in Coban. Upon removing the dressing patient had +plus one edema over hand and wrist, bruised.Informed by cath lab that IV had ilfitrated. Rewrapped and heat applied.

## 2021-10-31 NOTE — Progress Notes (Signed)
28fr sheath removed from left antecubital vein. Manual pressure applied for 10 minutes. Tegaderm dressing applied, then coban wrap.  No bruising or bleeding at site.

## 2021-10-31 NOTE — Interval H&P Note (Signed)
History and Physical Interval Note:  10/31/2021 7:34 AM  Rebekah Pope  has presented today for surgery, with the diagnosis of aortic stenosis.  The various methods of treatment have been discussed with the patient and family. After consideration of risks, benefits and other options for treatment, the patient has consented to  Procedure(s): RIGHT/LEFT HEART CATH AND CORONARY ANGIOGRAPHY (N/A) as a surgical intervention.  The patient's history has been reviewed, patient examined, no change in status, stable for surgery.  I have reviewed the patient's chart and labs.  Questions were answered to the patient's satisfaction.    Cath Lab Visit (complete for each Cath Lab visit)  Clinical Evaluation Leading to the Procedure:   ACS: No.  Non-ACS:    Anginal Classification: CCS II  Anti-ischemic medical therapy: No Therapy  Non-Invasive Test Results: No non-invasive testing performed  Prior CABG: No previous CABG        Verne Carrow

## 2021-11-11 ENCOUNTER — Ambulatory Visit (HOSPITAL_COMMUNITY)
Admission: RE | Admit: 2021-11-11 | Discharge: 2021-11-11 | Disposition: A | Discharge status: Home / Self Care | Payer: Medicare PPO | Source: Ambulatory Visit | Attending: Cardiovascular Disease | Admitting: Cardiovascular Disease

## 2021-11-11 DIAGNOSIS — I350 Nonrheumatic aortic (valve) stenosis: Secondary | ICD-10-CM | POA: Insufficient documentation

## 2021-11-11 MED ORDER — IOHEXOL 350 MG/ML SOLN
100.0000 mL | Freq: Once | INTRAVENOUS | Status: AC | PRN
  Administered 2021-11-11: 100 mL via INTRAVENOUS

## 2022-01-26 ENCOUNTER — Encounter: Payer: Self-pay | Admitting: Physician Assistant

## 2022-01-28 ENCOUNTER — Encounter: Payer: Self-pay | Admitting: Surgery

## 2022-01-28 ENCOUNTER — Institutional Professional Consult (permissible substitution): Payer: Medicare PPO | Admitting: Surgery

## 2022-01-28 VITALS — BP 203/76 | HR 50 | Resp 18 | Ht 63.0 in | Wt 192.0 lb

## 2022-01-28 DIAGNOSIS — I350 Nonrheumatic aortic (valve) stenosis: Secondary | ICD-10-CM | POA: Diagnosis not present

## 2022-01-28 MED ORDER — FUROSEMIDE 40 MG PO TABS: 40.0000 mg | ORAL_TABLET | Freq: Every day | ORAL | 0 refills | Status: DC

## 2022-01-28 NOTE — Progress Notes (Signed)
Pre Surgical Assessment: 5 M Walk Test  41M=16.39ft  5 Meter Walk Test- trial 1: 8.58 seconds 5 Meter Walk Test- trial 2: 7.48 seconds 5 Meter Walk Test- trial 3: 8.26 seconds 5 Meter Walk Test Average: 8.10 seconds

## 2022-01-28 NOTE — Progress Notes (Signed)
Patient ID: Rebekah Pope, female   DOB: [birth year], 81 y.o.   MRN: [id number]   [facility]         [address]       ,[place] [address]             [phone]          CARDIOTHORACIC SURGERY CONSULTATION REPORT  PCP is York, Ronn Melena, NP Referring Provider is Lauree Chandler, MD Primary Cardiologist is Dr. Geraldo Pitter  Reason for consultation:  Severe aortic stenosis  HPI:  The patient is an 81 year old woman with a history of hypertension, hyperlipidemia, hypothyroidism, paroxysmal atrial fibrillation on amiodarone and Xarelto, left bundle branch block, and severe aortic stenosis who was referred for consideration of TAVR.  She was seen by Dr. Geraldo Pitter and underwent a 2D echocardiogram on 10/15/2021 showing a calcified and thickened aortic valve with restricted leaflet mobility.  The mean gradient was 44 mmHg with a peak gradient of 70 mmHg and a valve area by VTI of 0.52 cm.  Dimensionless index was 0.16.  There was mild aortic insufficiency.  Left ventricular ejection fraction was 65 to 70% with moderate LVH and grade 2 diastolic dysfunction.  She reports onset of progressive exertional shortness of breath and fatigue.  She had mild lower extremity swelling which has resolved since being started on Lasix.  She denies any dizziness or syncope.  She has had no chest discomfort.  She is here today with her husband.  She remains active and independent.  She has 2 of her own teeth and dentures.  Past Medical History:  Diagnosis Date   Allergic rhinitis    Complex regional pain syndrome i of unspecified lower limb    Dizziness and giddiness    Essential (primary) hypertension    Hyperlipidemia    Hypothyroidism    Localized edema    Paroxysmal atrial fibrillation (HCC)    Severe aortic stenosis     Past Surgical History:  Procedure Laterality Date   CESAREAN SECTION     RIGHT HEART CATH AND  CORONARY ANGIOGRAPHY N/A 10/31/2021   Procedure: RIGHT HEART CATH AND CORONARY ANGIOGRAPHY;  Surgeon: Burnell Blanks, MD;  Location: [facility];  Service: Cardiovascular;  Laterality: N/A;    Family History  Problem Relation Age of Onset   Dementia Mother    Pneumonia Mother    Hip fracture Mother    Heart disease Father     Social History   Socioeconomic History   Marital status: Married    Spouse name: Not on file   Number of children: 4   Years of education: Not on file   Highest education level: Not on file  Occupational History   Occupation: Midwife  Tobacco Use   Smoking status: Never    Passive exposure: Past   Smokeless tobacco: Never  Vaping Use   Vaping Use: Never used  Substance and Sexual Activity   Alcohol use: Never   Drug use: Never   Sexual activity: Not on file  Other Topics Concern   Not on file  Social History Narrative   Not on file   Social Determinants of Health   Financial Resource Strain: Not on file  Food Insecurity: Not on file  Transportation Needs: Not on file  Physical Activity: Not on file  Stress: Not on file  Social Connections: Not on file  Intimate Partner Violence: Not on file  Prior to Admission medications   Medication Sig Start Date End Date Taking? Authorizing Provider  amiodarone (PACERONE) 200 MG tablet Take 100 mg by mouth daily. 07/17/21  Yes [provider]  furosemide (LASIX) 20 MG tablet Take 40 mg by mouth daily. 09/30/21  Yes [provider]  KLOR-CON M10 10 MEQ tablet Take 10 mEq by mouth daily. 09/30/21  Yes [provider]  levothyroxine (SYNTHROID) 112 MCG tablet Take 112 mcg by mouth daily. 09/29/21  Yes [provider]  rivaroxaban (XARELTO) 20 MG TABS tablet Take 20 mg by mouth daily with supper.   Yes [provider]  valsartan (DIOVAN) 40 MG tablet Take 40 mg by mouth daily. 08/26/21  Yes [provider]    Current  Outpatient Medications  Medication Sig Dispense Refill   amiodarone (PACERONE) 200 MG tablet Take 100 mg by mouth daily.     furosemide (LASIX) 20 MG tablet Take 40 mg by mouth daily.     KLOR-CON M10 10 MEQ tablet Take 10 mEq by mouth daily.     levothyroxine (SYNTHROID) 112 MCG tablet Take 112 mcg by mouth daily.     rivaroxaban (XARELTO) 20 MG TABS tablet Take 20 mg by mouth daily with supper.     valsartan (DIOVAN) 40 MG tablet Take 40 mg by mouth daily.     No current facility-administered medications for this visit.    No Known Allergies    Review of Systems:   General:  normal appetite, + decreased energy, no weight gain, no weight loss, no fever  Cardiac:  no chest pain with exertion, no chest pain at rest, +SOB with mild exertion, no resting SOB, no PND, no orthopnea, no palpitations, + arrhythmia, + atrial fibrillation, + has had LE edema resolved on lasix, no dizzy spells, no syncope  Respiratory:  + exertional shortness of breath, no home oxygen, no productive cough, no dry cough, no bronchitis, no wheezing, no hemoptysis, no asthma, no pain with inspiration or cough, no sleep apnea, no CPAP at night  GI:   no difficulty swallowing, n reflux, ono frequent heartburn, no hiatal hernia, no abdominal pain, no constipation, no diarrhea, no hematochezia, no hematemesis, no melena  GU:   no dysuria,  no frequency, no urinary tract infection, no hematuria, no kidney stones, no kidney disease  Vascular:  no pain suggestive of claudication, no pain in feet, no leg cramps, no varicose veins, no DVT, no non-healing foot ulcer  Neuro:   no stroke, no TIA's, no seizures, no headaches, no temporary blindness one eye,  no slurred speech, no peripheral neuropathy, no chronic pain, no instability of gait, no memory/cognitive dysfunction  Musculoskeletal: no arthritis, no joint swelling, no myalgias, no difficulty walking, normal mobility   Skin:   no rash, no itching, no skin infections, no  pressure sores or ulcerations  Psych:   no anxiety, no depression, no nervousness, no unusual recent stress  Eyes:   no blurry vision, no floaters, no recent vision changes, + wears glasses  ENT:   no hearing loss, no loose or painful teeth, + dentures with two of her own teeth.  Hematologic:  no easy bruising, no abnormal bleeding, no clotting disorder, no frequent epistaxis  Endocrine:  no diabetes, does not check CBG's at home     Physical Exam:   BP (!) 203/76 (BP Location: Right Arm, Patient Position: Sitting)   Pulse (!) 50   Resp 18   Ht 5\' 3"  (1.6 m)  Wt 192 lb (87.1 kg)   SpO2 94% Comment: RA  BMI 34.01 kg/m   General:  Elderly but  well-appearing  HEENT:  Unremarkable, NCAT, PERLA, EOMI  Neck:   no JVD, no bruits, no adenopathy   Chest:   clear to auscultation, symmetrical breath sounds, no wheezes, no rhonchi   CV:   RRR, 3/6 systolic murmur RSB, no diastolic murmur  Abdomen:  soft, non-tender, no masses   Extremities:  warm, well-perfused, pulses palpable at ankles, no lower extremity edema  Rectal/GU  Deferred  Neuro:   Grossly non-focal and symmetrical throughout  Skin:   Clean and dry, no rashes, no breakdown  Diagnostic Tests:  ECHOCARDIOGRAM REPORT         Patient Name:   GEORGIANNA LWIN Date of Exam: 10/15/2021  Medical Rec #:  [id number]            Height:       63.0 in  Accession #:    [id number]           Weight:       195.8 lb  Date of Birth:  07-05-1940             BSA:          1.916 m  Patient Age:    28 years             BP:           222/84 mmHg  Patient Gender: F                    HR:           53 bpm.  Exam Location:  [facility]   Procedure: 2D Echo, Cardiac Doppler, Color Doppler and Strain Analysis   Indications:    Severe aortic stenosis [I35.0 (ICD-10-CM)]     History:        Patient has prior history of Echocardiogram examinations,  most                  recent 05/08/2021. Aortic Valve Disease, Arrythmias:Atrial                   Fibrillation; Signs/Symptoms:Dyspnea,  Dizziness/Lightheadedness                  and Shortness of Breath.     Sonographer:    Luane School RDCS  Referring Phys: [facility]   IMPRESSIONS     1. Left ventricular ejection fraction, by estimation, is 65 to 70%. The  left ventricle has normal function. The left ventricle has no regional  wall motion abnormalities. There is moderate left ventricular hypertrophy.  Left ventricular diastolic  parameters are consistent with Grade II diastolic dysfunction  (pseudonormalization).   2. Right ventricular systolic function is normal. The right ventricular  size is normal. There is mildly elevated pulmonary artery systolic  pressure.   3. Left atrial size was severely dilated.   4. Right atrial size was moderately dilated.   5. The mitral valve is normal in structure. Mild to moderate mitral valve  regurgitation. Mild mitral stenosis. The mean mitral valve gradient is 4.0  mmHg.   6. DI 0.16, SVi 40. The aortic valve is calcified. Aortic valve  regurgitation is mild. Severe aortic valve stenosis. Aortic valve area, by  VTI measures 0.52 cm. Aortic valve mean gradient measures 44.0 mmHg.   7. The inferior vena cava is normal in size with  greater than 50%  respiratory variability, suggesting right atrial pressure of 3 mmHg.   FINDINGS   Left Ventricle: Left ventricular ejection fraction, by estimation, is 65  to 70%. The left ventricle has normal function. The left ventricle has no  regional wall motion abnormalities. The left ventricular internal cavity  size was normal in size. There is   moderate left ventricular hypertrophy. Left ventricular diastolic  parameters are consistent with Grade II diastolic dysfunction  (pseudonormalization).   Right Ventricle: The right ventricular size is normal. No increase in  right ventricular wall thickness. Right ventricular systolic function is  normal. There is mildly elevated pulmonary  artery systolic pressure. The  tricuspid regurgitant velocity is 2.91   m/s, and with an assumed right atrial pressure of 8 mmHg, the estimated  right ventricular systolic pressure is 41.9 mmHg.   Left Atrium: Left atrial size was severely dilated.   Right Atrium: Right atrial size was moderately dilated.   Pericardium: There is no evidence of pericardial effusion.   Mitral Valve: The mitral valve is normal in structure. There is mild  thickening of the mitral valve leaflet(s). There is moderate calcification  of the mitral valve leaflet(s). Mild mitral annular calcification. Mild to  moderate mitral valve  regurgitation. Mild mitral valve stenosis. MV peak gradient, 12.5 mmHg.  The mean mitral valve gradient is 4.0 mmHg.   Tricuspid Valve: The tricuspid valve is normal in structure. Tricuspid  valve regurgitation is not demonstrated. No evidence of tricuspid  stenosis.   Aortic Valve: DI 0.16, SVi 40. The aortic valve is calcified. Aortic valve  regurgitation is mild. Aortic regurgitation PHT measures 438 msec. Severe  aortic stenosis is present. Aortic valve mean gradient measures 44.0 mmHg.  Aortic valve peak gradient  measures 69.9 mmHg. Aortic valve area, by VTI measures 0.52 cm.   Pulmonic Valve: The pulmonic valve was normal in structure. Pulmonic valve  regurgitation is not visualized. No evidence of pulmonic stenosis.   Aorta: The aortic root is normal in size and structure.   Venous: The inferior vena cava is normal in size with greater than 50%  respiratory variability, suggesting right atrial pressure of 3 mmHg.   IAS/Shunts: No atrial level shunt detected by color flow Doppler.      LEFT VENTRICLE  PLAX 2D  LVIDd:         5.20 cm   Diastology  LVIDs:         3.40 cm   LV e' medial:    3.87 cm/s  LV PW:         1.50 cm   LV E/e' medial:  43.9  LV IVS:        1.50 cm   LV e' lateral:   4.75 cm/s  LVOT diam:     2.00 cm   LV E/e' lateral: 35.8  LV SV:          76  LV SV Index:   40  LVOT Area:     3.14 cm      RIGHT VENTRICLE            IVC  RV S prime:     9.36 cm/s  IVC diam: 2.30 cm  TAPSE (M-mode): 4.0 cm   LEFT ATRIUM              Index        RIGHT ATRIUM           Index  LA diam:  4.30 cm  2.24 cm/m   RA Area:     23.70 cm  LA Vol (A2C):   104.0 ml 54.27 ml/m  RA Volume:   77.10 ml  40.23 ml/m  LA Vol (A4C):   80.2 ml  41.85 ml/m  LA Biplane Vol: 93.0 ml  48.53 ml/m   AORTIC VALVE  AV Area (Vmax):    0.63 cm  AV Area (Vmean):   0.61 cm  AV Area (VTI):     0.52 cm  AV Vmax:           418.00 cm/s  AV Vmean:          308.000 cm/s  AV VTI:            1.470 m  AV Peak Grad:      69.9 mmHg  AV Mean Grad:      44.0 mmHg  LVOT Vmax:         83.50 cm/s  LVOT Vmean:        59.900 cm/s  LVOT VTI:          0.242 m  LVOT/AV VTI ratio: 0.16  AI PHT:            438 msec     AORTA  Ao Root diam: 3.50 cm  Ao Asc diam:  3.70 cm  Ao Desc diam: 2.20 cm   MITRAL VALVE                  TRICUSPID VALVE  MV Area (PHT): 3.51 cm       TR Peak grad:   33.9 mmHg  MV Area VTI:   1.32 cm       TR Vmax:        291.00 cm/s  MV Peak grad:  12.5 mmHg  MV Mean grad:  4.0 mmHg       SHUNTS  MV Vmax:       1.77 m/s       Systemic VTI:  0.24 m  MV Vmean:      86.8 cm/s      Systemic Diam: 2.00 cm  MV Decel Time: 216 msec  MR Peak grad:    165.9 mmHg  MR Mean grad:    99.0 mmHg  MR Vmax:         644.00 cm/s  MR Vmean:        469.0 cm/s  MR PISA:         2.65 cm  MR PISA Eff ROA: 16 mm  MR PISA Radius:  0.65 cm  MV E velocity: 170.00 cm/s  MV A velocity: 73.10 cm/s  MV E/A ratio:  2.33   Gypsy Balsam MD  Electronically signed by Gypsy Balsam MD  Signature Date/Time: 10/15/2021/12:42:07 PM         Final     Physicians  Panel Physicians Referring Physician Case Authorizing Physician  Kathleene Hazel, MD (Primary)     Procedures  RIGHT HEART CATH AND CORONARY ANGIOGRAPHY   Conclusion      Prox RCA  lesion is 20% stenosed.   Dist RCA lesion is 20% stenosed.   RPDA lesion is 30% stenosed.   RPAV lesion is 30% stenosed.   Prox Cx lesion is 30% stenosed.   Ramus lesion is 99% stenosed.   Mid LAD lesion is 60% stenosed.   2nd Diag lesion is 70% stenosed.   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   The LAD has moderate  mid vessel stenosis. The small diagonal branch has a moderately severe stenosis The Circumflex has mild non-obstructive disease. The small caliber intermediate branch has severe stenosis but is too small for PCI The RCA is a large dominant artery with mild non-obstructive disease in the proximal and distal vessel. The PDA and posterolateral branches have mild non-obstructive disease.  Severe aortic stenosis by echo. I did not cross the aortic valve today.  Elevated right heart pressures   Recommendations: Will continue workup for TAVR. Medical management of CAD. I will have her increase her Lasix to 40 mg daily.        Indications  Severe aortic stenosis [I35.0 (ICD-10-CM)]   Procedural Details  Technical Details Indication: 81 yo female with severe AS, workup for TAVR  Procedure: The risks, benefits, complications, treatment options, and expected outcomes were discussed with the patient. The patient and/or family concurred with the proposed plan, giving informed consent. The patient was brought to the cath lab after IV hydration was given. The patient was sedated with Versed and Fentanyl. The IV catheter in the left antecubital vein was changed for a 5 French sheath. Right heart catheterization performed with a balloon tipped catheter. The right wrist was prepped and draped in a sterile fashion. 1% lidocaine was used for local anesthesia. Using the modified Seldinger access technique, a 5 French sheath was placed in the right radial artery. 3 mg Verapamil was given through the sheath. Weight based IV heparin was given. Standard diagnostic  catheters were used to perform selective coronary angiography. I did not cross the aortic valve. All catheter exchanges were performed over an exchange length guidewire.   The sheath was removed from the right radial artery and a Terumo hemostasis band was applied at the arteriotomy site on the right wrist.   Of note, the left hand peripheral IV that was placed in the pre-op area infiltrated during the case. The IV was removed and pressure was held.  Estimated blood loss <50 mL.   During this procedure medications were administered to achieve and maintain moderate conscious sedation while the patient's heart rate, blood pressure, and oxygen saturation were continuously monitored and I was present face-to-face 100% of this time.   Medications (Filter: Administrations occurring from [phone] to 1002 on 10/31/21) Heparin (Porcine) in NaCl 1000-0.9 UT/500ML-% SOLN (mL) Total volume:  1,000 mL  Date/Time Rate/Dose/Volume Action   10/31/21 0849 500 mL Given   0849 500 mL Given    midazolam (VERSED) injection (mg) Total dose:  1 mg  Date/Time Rate/Dose/Volume Action   10/31/21 0901 1 mg Given    fentaNYL (SUBLIMAZE) injection (mcg) Total dose:  25 mcg  Date/Time Rate/Dose/Volume Action   10/31/21 0901 25 mcg Given    lidocaine (PF) (XYLOCAINE) 1 % injection (mL) Total volume:  4 mL  Date/Time Rate/Dose/Volume Action   10/31/21 0914 2 mL Given   0916 2 mL Given    Radial Cocktail/Verapamil only (mL) Total volume:  10 mL  Date/Time Rate/Dose/Volume Action   10/31/21 0924 10 mL Given    heparin sodium (porcine) injection (Units) Total dose:  4,500 Units  Date/Time Rate/Dose/Volume Action   10/31/21 0931 4,500 Units Given    iohexol (OMNIPAQUE) 350 MG/ML injection (mL) Total volume:  80 mL  Date/Time Rate/Dose/Volume Action   10/31/21 0946 80 mL Given    Sedation Time  Sedation Time Physician-1: 42 minutes 29 seconds Contrast  Medication Name Total Dose  iohexol  (OMNIPAQUE) 350 MG/ML injection 80 mL  Radiation/Fluoro  Fluoro time: 8.7 (min) DAP: 16467 (mGycm2) Cumulative Air Kerma: 269 (mGy) Coronary Findings  Diagnostic Dominance: Right Left Anterior Descending  Vessel is large.  Mid LAD lesion is 60% stenosed.    Second Diagonal Branch  Vessel is small in size.  2nd Diag lesion is 70% stenosed.    Ramus Intermedius  Vessel is small.  Ramus lesion is 99% stenosed.    Left Circumflex  Vessel is large.  Prox Cx lesion is 30% stenosed.    Right Coronary Artery  Vessel is large.  Prox RCA lesion is 20% stenosed.  Dist RCA lesion is 20% stenosed.    Right Posterior Descending Artery  RPDA lesion is 30% stenosed.    Right Posterior Atrioventricular Artery  RPAV lesion is 30% stenosed.    Intervention   No interventions have been documented.   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   Coronary Diagrams  Diagnostic Dominance: Right  Intervention  Implants     No implant documentation for this case.   Syngo Images   Show images for CARDIAC CATHETERIZATION Images on Long Term Storage   Show images for Kashe, Treger to Procedure Log  Procedure Log    Hemo Data  Flowsheet Row Most Recent Value  Fick Cardiac Output 6.4 L/min  Fick Cardiac Output Index 3.37 (L/min)/BSA  RA A Wave 14 mmHg  RA V Wave 9 mmHg  RA Mean 9 mmHg  RV Systolic Pressure 49 mmHg  RV Diastolic Pressure 4 mmHg  RV EDP 20 mmHg  PA Systolic Pressure 55 mmHg  PA Diastolic Pressure 20 mmHg  PA Mean 35 mmHg  PW A Wave 29 mmHg  PW V Wave 39 mmHg  PW Mean 27 mmHg  AO Systolic Pressure [id number] mmHg  AO Diastolic Pressure 54 mmHg  AO Mean 86 mmHg  QP/QS 1  TPVR Index 10.39 HRUI  TSVR Index 25.53 HRUI  PVR SVR Ratio 0.1  TPVR/TSVR Ratio 0.41    ADDENDUM REPORT: 11/12/2021 19:49   CLINICAL DATA:  Severe Aortic Stenosis.   EXAM: Cardiac TAVR CT   TECHNIQUE: A  non-contrast, gated CT scan was obtained with axial slices of 3 mm through the heart for aortic valve calcium scoring. A 120 kV retrospective, gated, contrast cardiac scan was obtained. Gantry rotation speed was 250 msecs and collimation was 0.6 mm. Nitroglycerin was not given. The 3D data set was reconstructed in 5% intervals of the 0-95% of the R-R cycle. Systolic and diastolic phases were analyzed on a dedicated workstation using MPR, MIP, and VRT modes. The patient received 100 cc of contrast.   FINDINGS: Image quality: Excellent.   Noise artifact is: Limited.   Valve Morphology: Tricuspid aortic valve. Severe calcifications that are diffuse. Severely restricted leaflet movement in systole.   Aortic Valve Calcium score: 1403   Aortic annular dimension:   Phase assessed: 20%   Annular area: 326 mm2   Annular perimeter: 65.1 mm   Max diameter: 22.8 mm   Min diameter: 18.8 mm   Annular and subannular calcification: None.   Membranous septum length: 5.2 mm   Optimal coplanar projection: RAO 2 CRA 6   Coronary Artery Height above Annulus:   Left Main: 11.1 mm   Right Coronary: 12.4 mm   Sinus of Valsalva Measurements:   Non-coronary: 30 mm   Right-coronary: 31 mm   Left-coronary: 31 mm   Sinus of Valsalva Height:   Non-coronary: 16.5 mm  Right-coronary: 18.9 mm   Left-coronary: 19.1 mm   Sinotubular Junction: 29 mm with moderate calcifications.   Ascending Thoracic Aorta: 36 mm   Coronary Arteries: Normal coronary origin. Right dominance. The study was performed without use of NTG and is insufficient for plaque evaluation. Please refer to recent cardiac catheterization for coronary assessment. 3-vessel coronary calcifications.   Cardiac Morphology:   Right Atrium: Right atrial size is within normal limits.   Right Ventricle: The right ventricular cavity is within normal limits.   Left Atrium: Left atrial size is normal in size with no left  atrial appendage filling defect.   Left Ventricle: The ventricular cavity size is within normal limits. There are no stigmata of prior infarction. There is no abnormal filling defect.   Pulmonary arteries: Dilated suggestive of pulmonary hypertension.   Pulmonary veins: Normal pulmonary venous drainage.   Pericardium: Normal thickness with no significant effusion or calcium present.   Mitral Valve: The mitral valve is degenerative with severe caseating mitral annular calcium.   Extra-cardiac findings: See attached radiology report for non-cardiac structures.   IMPRESSION: 1. Tricuspid aortic valve with severe aortic stenosis.   2. Small annular measurements noted (326 mm2). Would consider 26 mm Evolut Pro. Aortic root and sinus heights favorable for self-expanding valve. Moderate, nearly circumferential calcium noted at the STJ.   3. Small membranous septum (5.2 mm).   4. No significant annular or subannular calcifications.   5. Sufficient coronary to annulus distance.   6. Optimal Fluoroscopic Angle for Delivery: RAO 2 CRA 6   7. Dilated pulmonary artery suggestive of pulmonary hypertension.   8. Severe, caseating mitral annular calcium noted.   Lake Bells T. Audie Box, MD     Electronically Signed   By: Eleonore Chiquito M.D.   On: 11/12/2021 19:49    Narrative & Impression  CLINICAL DATA:  Preop evaluation for TAVR.   EXAM: CTA ABDOMEN AND PELVIS WITHOUT AND WITH CONTRAST   TECHNIQUE: Multidetector CT imaging of the abdomen and pelvis was performed using the standard protocol during bolus administration of intravenous contrast. Multiplanar reconstructed images and MIPs were obtained and reviewed to evaluate the vascular anatomy.   RADIATION DOSE REDUCTION: This exam was performed according to the departmental dose-optimization program which includes automated exposure control, adjustment of the mA and/or kV according to patient size and/or use of iterative  reconstruction technique.   CONTRAST:  165mL OMNIPAQUE IOHEXOL 350 MG/ML SOLN   COMPARISON:  None Available.   FINDINGS: CTA CHEST FINDINGS   Cardiovascular: Mild cardiomegaly. No pericardial effusion. Moderate three-vessel coronary artery calcifications. Aortic valve calcifications and thickening. Severe atherosclerotic disease of the thoracic aorta. Standard three-vessel aortic arch with no significant narrowing.   Mediastinum/Nodes: Small hiatal hernia. Thyroid is unremarkable. No pathologically enlarged lymph nodes seen in the chest.   Lungs/Pleura: Central airways are patent. No consolidation effusion or pneumothorax. Multiple small solid pulmonary nodules. Nodule of the right middle lobe measuring 4 mm on series 7, image 43. Additional nodules are seen in the superior portion of the right lower lobe measuring 3 mm on image 43.   Musculoskeletal: No chest wall abnormality. No acute or significant osseous findings.   CTA ABDOMEN AND PELVIS FINDINGS   Hepatobiliary: No suspicious liver lesions. Numerous gallstones, no evidence of gallbladder wall thickening. No biliary ductal dilation.   Pancreas: Unremarkable. No pancreatic ductal dilatation or surrounding inflammatory changes.   Spleen: Normal in size without focal abnormality.   Adrenals/Urinary Tract: Bilateral adrenal glands are unremarkable. No  hydronephrosis or nephrolithiasis. No suspicious renal lesions. Bladder is unremarkable.   Stomach/Bowel: Stomach is within normal limits. Diverticulosis. No evidence of bowel wall thickening, distention, or inflammatory changes.   Vascular/lymphatic: No pathologically enlarged nodes seen in the abdomen or pelvis. Normal caliber abdominal aorta with moderate atherosclerotic disease. Mild narrowing at the origin of the celiac and SMA due to calcified and noncalcified plaque IMA is patent. Bilateral renal arteries are patent with no significant narrowing    Reproductive: Fibroid uterus with large calcified fibroid of the uterine body measuring up to 6.8 cm.   Other: No abdominal wall hernia or abnormality. No abdominopelvic ascites.   Musculoskeletal: No acute or significant osseous findings.   VASCULAR MEASUREMENTS PERTINENT TO TAVR:   AORTA:   Minimal Aortic Diameter-12.3 mm   Severity of Aortic Calcification-moderate   RIGHT PELVIS:   Right Common Iliac Artery -   Minimal Diameter-9.6 mm   Tortuosity-moderate   Calcification-mild   Right External Iliac Artery -   Minimal Diameter-3.3 mm   Tortuosity-mild   Calcification-none   Right Common Femoral Artery -   Minimal Diameter-7.3 mm   Tortuosity-none   Calcification-mild   LEFT PELVIS:   Left Common Iliac Artery -   Minimal Diameter-7.9 mm   Tortuosity-severe   Calcification-mild   Left External Iliac Artery -   Minimal Diameter-8.5 mm   Tortuosity-mild   Calcification-none   Left Common Femoral Artery -   Minimal Diameter-7.0 mm   Tortuosity-none   Calcification-mild   Review of the MIP images confirms the above findings.   IMPRESSION: 1. Vascular findings and measurements pertinent to potential TAVR procedure, as detailed above. 2. Severe thickening calcification of the aortic valve, compatible with reported clinical history of severe aortic stenosis. 3. 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 Three vessel coronary artery disease. 4. Small solid pulmonary nodules, largest measures 4 mm in the right middle lobe. No follow-up needed if patient is low-risk (and has no known or suspected primary neoplasm). Non-contrast chest CT can be considered in 12 months if patient is high-risk. This recommendation follows the consensus statement: Guidelines for Management of Incidental Pulmonary Nodules Detected on CT Images: From the Fleischner Society 2017; Radiology 2017; 284:228-243.     Electronically  Signed   By: Yetta Glassman M.D.   On: 11/11/2021 16:04      Impression:  This 81 year old woman has stage D, severe, symptomatic aortic stenosis with New York Heart Association class II symptoms of exertional fatigue and shortness of breath consistent with chronic diastolic congestive heart failure.  I have personally reviewed her 2D echocardiogram, cardiac catheterization, and CTA studies.  Her echocardiogram shows a severely calcified and restricted aortic valve with a mean gradient of 44 mmHg and a valve area of 0.5 cm consistent with severe aortic stenosis.  Left ventricular systolic function is normal.  Cardiac catheterization shows a 60% mid LAD stenosis.  The left circumflex has mild nonobstructive disease.  There is a small ramus branch with a 99% stenosis.  The RCA is a large dominant vessel with mild nonobstructive disease in the proximal and distal vessel.  She is not having any chest discomfort and her coronary disease can be treated medically.  I agree that aortic valve replacement is indicated in this patient for relief of her symptoms and to prevent left ventricular dysfunction.  Given her age and diffuse calcification of her ascending aorta I think that transcatheter aortic valve replacement would be the only option for treating her.  Her gated cardiac CTA shows a small aortic annulus but her anatomy is suitable for TAVR using a Medtronic Evolut FX valve.  Her abdominal and pelvic CTA shows adequate pelvic vascular anatomy to allow transfemoral insertion.  The patient was counseled at length regarding treatment alternatives for management of severe symptomatic aortic stenosis. The risks and benefits of surgical intervention has been discussed in detail. Long-term prognosis with medical therapy was discussed. Alternative approaches such as conventional surgical aortic valve replacement, transcatheter aortic valve replacement, and palliative medical therapy were compared and contrasted  at length. This discussion was placed in the context of the patient's own specific clinical presentation and past medical history. All of their questions have been addressed.   Following the decision to proceed with transcatheter aortic valve replacement, a discussion was held regarding what types of management strategies would be attempted intraoperatively in the event of life-threatening complications, including whether or not the patient would be considered a candidate for the use of cardiopulmonary bypass and/or conversion to open sternotomy for attempted surgical intervention.  I do not think she is a candidate for emergent sternotomy to manage any intraoperative complications since she has diffuse calcification of her ascending aorta.  The patient is aware of the fact that transient use of cardiopulmonary bypass may be necessary. The patient has been advised of a variety of complications that might develop including but not limited to risks of death, stroke, paravalvular leak, aortic dissection or other major vascular complications, aortic annulus rupture, device embolization, cardiac rupture or perforation, mitral regurgitation, acute myocardial infarction, arrhythmia, heart block or bradycardia requiring permanent pacemaker placement, congestive heart failure, respiratory failure, renal failure, pneumonia, infection, other late complications related to structural valve deterioration or migration, or other complications that might ultimately cause a temporary or permanent loss of functional independence or other long term morbidity. The patient provides full informed consent for the procedure as described and all questions were answered.     Plan:  She will be scheduled for transfemoral TAVR using a Medtronic valve on 02/03/2022.  Her Xarelto will be held preoperatively.  I spent 60 minutes performing this consultation and > 50% of this time was spent face to face counseling and coordinating the care  of this patient's severe symptomatic aortic stenosis.   Gaye Pollack, MD 01/28/2022

## 2022-02-03 ENCOUNTER — Other Ambulatory Visit: Payer: Self-pay

## 2022-02-03 DIAGNOSIS — I350 Nonrheumatic aortic (valve) stenosis: Secondary | ICD-10-CM

## 2022-02-05 NOTE — Pre-Procedure Instructions (Signed)
Surgical Instructions    Your procedure is scheduled on Tuesday, September 5th.  Report to [facility] Main Entrance "A" at 07:15 A.M., then check in with the Admitting office.  Call this number if you have problems the morning of surgery:  [phone]   If you have any questions prior to your surgery date call [phone]: Open Monday-Friday 8am-4pm    Remember:  Do not eat or drink after midnight the night before your surgery     STOP on Wednesday, 8/30, taking any Aspirin (unless otherwise instructed by your surgeon), Aleve, Naproxen, Ibuprofen, Motrin, Advil, Goody's, BC's, all herbal medications, fish oil, and all non-prescription vitamins.    Stop taking Xarelto on Sunday, September 3rd.  You will take your last dose on Saturday evening, September 2nd.     Continue taking all other medications without change through the day before surgery. On the morning of surgery take ONLY Levothyroxine with a sip of water.              Do NOT Smoke (Tobacco/Vaping) for 24 hours prior to your procedure.  If you use a CPAP at night, you may bring your mask/headgear for your overnight stay.   Contacts, glasses, piercing's, hearing aid's, dentures or partials may not be worn into surgery, please bring cases for these belongings.    For patients admitted to the hospital, discharge time will be determined by your treatment team.   Patients discharged the day of surgery will not be allowed to drive home, and someone needs to stay with them for 24 hours.  SURGICAL WAITING ROOM VISITATION Patients having surgery or a procedure may have no more than 2 support people in the waiting area - these visitors may rotate.   Children under the age of 53 must have an adult with them who is not the patient. If the patient needs to stay at the hospital during part of their recovery, the visitor guidelines for inpatient rooms apply. Pre-op nurse will coordinate an appropriate time for 1 support person to  accompany patient in pre-op.  This support person may not rotate.   Please refer to the [facility] website for the visitor guidelines for Inpatients (after your surgery is over and you are in a regular room).    Special instructions:   - Preparing For Surgery  Before surgery, you can play an important role. Because skin is not sterile, your skin needs to be as free of germs as possible. You can reduce the number of germs on your skin by washing with CHG (chlorahexidine gluconate) Soap before surgery.  CHG is an antiseptic cleaner which kills germs and bonds with the skin to continue killing germs even after washing.    Oral Hygiene is also important to reduce your risk of infection.  Remember - BRUSH YOUR TEETH THE MORNING OF SURGERY WITH YOUR REGULAR TOOTHPASTE  Please do not use if you have an allergy to CHG or antibacterial soaps. If your skin becomes reddened/irritated stop using the CHG.  Do not shave (including legs and underarms) for at least 48 hours prior to first CHG shower. It is OK to shave your face.  Please follow these instructions carefully.   Shower the NIGHT BEFORE SURGERY and the MORNING OF SURGERY  If you chose to wash your hair, wash your hair first as usual with your normal shampoo.  After you shampoo, rinse your hair and body thoroughly to remove the shampoo.  Use CHG Soap as you would any other  liquid soap. You can apply CHG directly to the skin and wash gently with a scrungie or a clean washcloth.   Apply the CHG Soap to your body ONLY FROM THE NECK DOWN.  Do not use on open wounds or open sores. Avoid contact with your eyes, ears, mouth and genitals (private parts). Wash Face and genitals (private parts)  with your normal soap.   Wash thoroughly, paying special attention to the area where your surgery will be performed.  Thoroughly rinse your body with warm water from the neck down.  DO NOT shower/wash with your normal soap after using and rinsing  off the CHG Soap.  Pat yourself dry with a CLEAN TOWEL.  Wear CLEAN PAJAMAS to bed the night before surgery  Place CLEAN SHEETS on your bed the night before your surgery  DO NOT SLEEP WITH PETS.   Day of Surgery: Take a shower with CHG soap. Do not wear jewelry or makeup Do not wear lotions, powders, perfumes, or deodorant. Do not shave 48 hours prior to surgery.   Do not bring valuables to the hospital.  [facility] is not responsible for any belongings or valuables. Do not wear nail polish, gel polish, artificial nails, or any other type of covering on natural nails (fingers and toes) If you have artificial nails or gel coating that need to be removed by a nail salon, please have this removed prior to surgery. Artificial nails or gel coating may interfere with anesthesia's ability to adequately monitor your vital signs. Wear Clean/Comfortable clothing the morning of surgery Remember to brush your teeth WITH YOUR REGULAR TOOTHPASTE.   Please read over the following fact sheets that you were given.    If you received a COVID test during your pre-op visit  it is requested that you wear a mask when out in public, stay away from anyone that may not be feeling well and notify your surgeon if you develop symptoms. If you have been in contact with anyone that has tested positive in the last 10 days please notify you surgeon.

## 2022-02-06 ENCOUNTER — Encounter (HOSPITAL_COMMUNITY)
Admission: RE | Admit: 2022-02-06 | Discharge: 2022-02-06 | Disposition: A | Discharge status: Home / Self Care | Payer: Medicare PPO | Source: Ambulatory Visit | Attending: Cardiovascular Disease | Admitting: Cardiovascular Disease

## 2022-02-06 ENCOUNTER — Ambulatory Visit (HOSPITAL_COMMUNITY)
Admission: RE | Admit: 2022-02-06 | Discharge: 2022-02-06 | Disposition: A | Discharge status: Home / Self Care | Payer: Medicare PPO | Source: Ambulatory Visit | Attending: Cardiovascular Disease | Admitting: Cardiovascular Disease

## 2022-02-06 ENCOUNTER — Encounter (HOSPITAL_COMMUNITY): Payer: Self-pay

## 2022-02-06 ENCOUNTER — Other Ambulatory Visit: Payer: Self-pay

## 2022-02-06 VITALS — BP 220/68 | HR 55 | Temp 97.7°F | Resp 17 | Ht 63.0 in | Wt 187.8 lb

## 2022-02-06 DIAGNOSIS — Z01818 Encounter for other preprocedural examination: Secondary | ICD-10-CM | POA: Insufficient documentation

## 2022-02-06 DIAGNOSIS — Z20822 Contact with and (suspected) exposure to covid-19: Secondary | ICD-10-CM | POA: Insufficient documentation

## 2022-02-06 DIAGNOSIS — I350 Nonrheumatic aortic (valve) stenosis: Secondary | ICD-10-CM | POA: Diagnosis not present

## 2022-02-06 DIAGNOSIS — R011 Cardiac murmur, unspecified: Secondary | ICD-10-CM

## 2022-02-06 HISTORY — DX: Cardiac murmur, unspecified: R01.1

## 2022-02-06 HISTORY — DX: Dyspnea, unspecified: R06.00

## 2022-02-06 HISTORY — DX: Other specified postprocedural states: Z98.890

## 2022-02-06 HISTORY — DX: Other specified postprocedural states: R11.2

## 2022-02-06 HISTORY — DX: Personal history of other diseases of the digestive system: Z87.19

## 2022-02-06 LAB — URINALYSIS, ROUTINE W REFLEX MICROSCOPIC
Bacteria, UA: NONE SEEN
Bilirubin Urine: NEGATIVE
Glucose, UA: NEGATIVE mg/dL
Hgb urine dipstick: NEGATIVE
Ketones, ur: NEGATIVE mg/dL
Nitrite: NEGATIVE
Protein, ur: NEGATIVE mg/dL
Specific Gravity, Urine: 1.013 (ref 1.005–1.030)
pH: 5 (ref 5.0–8.0)

## 2022-02-06 LAB — PROTIME-INR
INR: 2.2 — ABNORMAL HIGH (ref 0.8–1.2)
Prothrombin Time: 24.5 seconds — ABNORMAL HIGH (ref 11.4–15.2)

## 2022-02-06 LAB — CBC
HCT: 42.8 % (ref 36.0–46.0)
Hemoglobin: 14 g/dL (ref 12.0–15.0)
MCH: 28.8 pg (ref 26.0–34.0)
MCHC: 32.7 g/dL (ref 30.0–36.0)
MCV: 88.1 fL (ref 80.0–100.0)
Platelets: 243 10*3/uL (ref 150–400)
RBC: 4.86 MIL/uL (ref 3.87–5.11)
RDW: 13.3 % (ref 11.5–15.5)
WBC: 6.1 10*3/uL (ref 4.0–10.5)
nRBC: 0 % (ref 0.0–0.2)

## 2022-02-06 LAB — COMPREHENSIVE METABOLIC PANEL
ALT: 12 U/L (ref 0–44)
AST: 21 U/L (ref 15–41)
Albumin: 3.9 g/dL (ref 3.5–5.0)
Alkaline Phosphatase: 58 U/L (ref 38–126)
Anion gap: 7 (ref 5–15)
BUN: 24 mg/dL — ABNORMAL HIGH (ref 8–23)
CO2: 28 mmol/L (ref 22–32)
Calcium: 9.2 mg/dL (ref 8.9–10.3)
Chloride: 104 mmol/L (ref 98–111)
Creatinine, Ser: 1.35 mg/dL — ABNORMAL HIGH (ref 0.44–1.00)
GFR, Estimated: 39 mL/min — ABNORMAL LOW (ref >60)
Glucose, Bld: 106 mg/dL — ABNORMAL HIGH (ref 70–99)
Potassium: 4.1 mmol/L (ref 3.5–5.1)
Sodium: 139 mmol/L (ref 135–145)
Total Bilirubin: 0.9 mg/dL (ref 0.3–1.2)
Total Protein: 7.2 g/dL (ref 6.5–8.1)

## 2022-02-06 LAB — TYPE AND SCREEN
ABO/RH(D): A NEG
Antibody Screen: NEGATIVE

## 2022-02-06 LAB — SURGICAL PCR SCREEN
MRSA, PCR: NEGATIVE
Staphylococcus aureus: NEGATIVE

## 2022-02-06 MED ORDER — DEXMEDETOMIDINE HCL IN NACL 400 MCG/100ML IV SOLN
0.1000 ug/kg/h | INTRAVENOUS | Status: AC
  Administered 2022-02-10: 1 ug/kg/h via INTRAVENOUS
  Filled 2022-02-06 (×2): qty 100

## 2022-02-06 MED ORDER — POTASSIUM CHLORIDE 2 MEQ/ML IV SOLN
80.0000 meq | INTRAVENOUS | Status: DC
  Filled 2022-02-06: qty 40

## 2022-02-06 MED ORDER — HEPARIN 30,000 UNITS/1000 ML (OHS) CELLSAVER SOLUTION
Status: DC
  Filled 2022-02-06: qty 1000

## 2022-02-06 MED ORDER — CEFAZOLIN SODIUM-DEXTROSE 2-4 GM/100ML-% IV SOLN
2.0000 g | INTRAVENOUS | Status: AC
  Administered 2022-02-10: 2 g via INTRAVENOUS
  Filled 2022-02-06: qty 100

## 2022-02-06 MED ORDER — NOREPINEPHRINE 4 MG/250ML-% IV SOLN
0.0000 ug/min | INTRAVENOUS | Status: DC
  Filled 2022-02-06: qty 250

## 2022-02-06 MED ORDER — MAGNESIUM SULFATE 50 % IJ SOLN
40.0000 meq | INTRAMUSCULAR | Status: DC
  Filled 2022-02-06: qty 9.85

## 2022-02-06 NOTE — Progress Notes (Addendum)
PCP - Drusilla Kanner NP Cardiologist - Revankar  PPM/ICD - denies   Chest x-ray - pending EKG - pending Stress Test - pt reports done at [facility] in 2015-16 ECHO - 10/15/21 Cardiac Cath - 10/31/21  Sleep Study - denies CPAP - denies  No diabetes  STOP on Wednesday, 8/30, taking any Aspirin (unless otherwise instructed by your surgeon), Aleve, Naproxen, Ibuprofen, Motrin, Advil, Goody's, BC's, all herbal medications, fish oil, and all non-prescription vitamins.    Stop taking Xarelto on Sunday, September 3rd.  You will take your last dose on Saturday evening, September 2nd.     Continue taking all other medications without change through the day before surgery. On the morning of surgery take ONLY Levothyroxine with a sip of water.  ERAS Protcol - n/a PRE-SURGERY Ensure or G2- n/a  COVID TEST- pending   Anesthesia review: yes  Patient denies shortness of breath, fever, cough and chest pain at PAT appointment   All instructions explained to the patient, with a verbal understanding of the material. Patient agrees to go over the instructions while at home for a better understanding. Patient also instructed to self quarantine after being tested for COVID-19. The opportunity to ask questions was provided.

## 2022-02-07 LAB — SARS CORONAVIRUS 2 (TAT 6-24 HRS): SARS Coronavirus 2: NEGATIVE

## 2022-02-09 NOTE — Anesthesia Preprocedure Evaluation (Addendum)
Anesthesia Evaluation  Patient identified by MRN, date of birth, ID band Patient awake    Reviewed: Allergy & Precautions, NPO status , Patient's Chart, lab work & pertinent test results  History of Anesthesia Complications (+) PONV and history of anesthetic complications  Airway Mallampati: II  TM Distance: >3 FB Neck ROM: Full    Dental  (+) Upper Dentures, Lower Dentures   Pulmonary neg pulmonary ROS,    Pulmonary exam normal        Cardiovascular hypertension, Pt. on medications + Valvular Problems/Murmurs AS  Rhythm:Regular Rate:Normal + Systolic murmurs ECHO 12/19: 1. Left ventricular ejection fraction, by estimation, is 65 to 70%. The  left ventricle has normal function. The left ventricle has no regional  wall motion abnormalities. There is moderate left ventricular hypertrophy.  Left ventricular diastolic  parameters are consistent with Grade II diastolic dysfunction  (pseudonormalization).  2. Right ventricular systolic function is normal. The right ventricular  size is normal. There is mildly elevated pulmonary artery systolic  pressure.  3. Left atrial size was severely dilated.  4. Right atrial size was moderately dilated.  5. The mitral valve is normal in structure. Mild to moderate mitral valve  regurgitation. Mild mitral stenosis. The mean mitral valve gradient is 4.0  mmHg.  6. DI 0.16, SVi 40. The aortic valve is calcified. Aortic valve  regurgitation is mild. Severe aortic valve stenosis. Aortic valve area, by  VTI measures 0.52 cm. Aortic valve mean gradient measures 44.0 mmHg.  7. The inferior vena cava is normal in size with greater than 50%  respiratory variability, suggesting right atrial pressure of 3 mmHg.  Cath:  Prox RCA lesion is 20% stenosed. .  Dist RCA lesion is 20% stenosed. Marland Kitchen  RPDA lesion is 30% stenosed. Marland Kitchen  RPAV lesion is 30% stenosed. .  Prox Cx lesion is 30% stenosed. .  Ramus  lesion is 99% stenosed. .  Mid LAD lesion is 60% stenosed. .  2nd Diag lesion is 70% stenosed. .  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    Neuro/Psych negative neurological ROS  negative psych ROS   GI/Hepatic Neg liver ROS, hiatal hernia,   Endo/Other  Hypothyroidism   Renal/GU negative Renal ROS  negative genitourinary   Musculoskeletal negative musculoskeletal ROS (+)   Abdominal Normal abdominal exam  (+)   Peds  Hematology negative hematology ROS (+)   Anesthesia Other Findings   Reproductive/Obstetrics                            Anesthesia Physical Anesthesia Plan  ASA: 4  Anesthesia Plan: MAC   Post-op Pain Management:    Induction: Intravenous  PONV Risk Score and Plan: 3 and Ondansetron, Dexamethasone and Treatment may vary due to age or medical condition  Airway Management Planned: Simple Face Mask and Nasal Cannula  Additional Equipment: Arterial line  Intra-op Plan:   Post-operative Plan:   Informed Consent: I have reviewed the patients History and Physical, chart, labs and discussed the procedure including the risks, benefits and alternatives for the proposed anesthesia with the patient or authorized representative who has indicated his/her understanding and acceptance.     Dental advisory given  Plan Discussed with:   Anesthesia Plan Comments: (Lab Results      Component                Value  Date                      WBC                      6.1                 02/06/2022                HGB                      14.0                02/06/2022                HCT                      42.8                02/06/2022                MCV                      88.1                02/06/2022                PLT                      243                 02/06/2022           Lab Results      Component                Value               Date                      NA                        139                 02/06/2022                K                        4.1                 02/06/2022                CO2                      28                  02/06/2022                GLUCOSE                  106 (H)             02/06/2022                BUN                      24 (H)  02/06/2022                CREATININE               1.35 (H)            02/06/2022                CALCIUM                  9.2                 02/06/2022                EGFR                     47 (L)              10/22/2021                GFRNONAA                 39 (L)              02/06/2022          )       Anesthesia Quick Evaluation

## 2022-02-09 NOTE — H&P (Signed)
[address]       [place] [address]             [phone]      Cardiothoracic Surgery Admission History and Physical   PCP is Imagene Riches, NP Referring Provider is Lauree Chandler, MD Primary Cardiologist is Dr. Geraldo Pitter   Reason for admission:  Severe aortic stenosis   HPI:   The patient is an 81 year old woman with a history of hypertension, hyperlipidemia, hypothyroidism, paroxysmal atrial fibrillation on amiodarone and Xarelto, left bundle branch block, and severe aortic stenosis who was referred for consideration of TAVR.  She was seen by Dr. Geraldo Pitter and underwent a 2D echocardiogram on 10/15/2021 showing a calcified and thickened aortic valve with restricted leaflet mobility.  The mean gradient was 44 mmHg with a peak gradient of 70 mmHg and a valve area by VTI of 0.52 cm.  Dimensionless index was 0.16.  There was mild aortic insufficiency.  Left ventricular ejection fraction was 65 to 70% with moderate LVH and grade 2 diastolic dysfunction.  She reports onset of progressive exertional shortness of breath and fatigue.  She had mild lower extremity swelling which has resolved since being started on Lasix.  She denies any dizziness or syncope.  She has had no chest discomfort.   She lives with her husband.  She remains active and independent.  She has 2 of her own teeth and dentures.       Past Medical History:  Diagnosis Date   Allergic rhinitis     Complex regional pain syndrome i of unspecified lower limb     Dizziness and giddiness     Essential (primary) hypertension     Hyperlipidemia     Hypothyroidism     Localized edema     Paroxysmal atrial fibrillation (HCC)     Severe aortic stenosis             Past Surgical History:  Procedure Laterality Date   CESAREAN SECTION       RIGHT HEART CATH AND CORONARY ANGIOGRAPHY N/A 10/31/2021    Procedure: RIGHT HEART CATH AND CORONARY ANGIOGRAPHY;  Surgeon: Burnell Blanks, MD;   Location: [facility];  Service: Cardiovascular;  Laterality: N/A;           Family History  Problem Relation Age of Onset   Dementia Mother     Pneumonia Mother     Hip fracture Mother     Heart disease Father        Social History         Socioeconomic History   Marital status: Married      Spouse name: Not on file   Number of children: 4   Years of education: Not on file   Highest education level: Not on file  Occupational History   Occupation: Midwife  Tobacco Use   Smoking status: Never      Passive exposure: Past   Smokeless tobacco: Never  Vaping Use   Vaping Use: Never used  Substance and Sexual Activity   Alcohol use: Never   Drug use: Never   Sexual activity: Not on file  Other Topics Concern   Not on file  Social History Narrative   Not on file    Social Determinants of Health    Financial Resource Strain: Not on file  Food Insecurity: Not on file  Transportation Needs: Not on file  Physical Activity: Not on file  Stress: Not on file  Social Connections: Not on file  Intimate Partner Violence: Not on file             Prior to Admission medications   Medication Sig Start Date End Date Taking? Authorizing Provider  amiodarone (PACERONE) 200 MG tablet Take 100 mg by mouth daily. 07/17/21   Yes [provider]  furosemide (LASIX) 20 MG tablet Take 40 mg by mouth daily. 09/30/21   Yes [provider]  KLOR-CON M10 10 MEQ tablet Take 10 mEq by mouth daily. 09/30/21   Yes [provider]  levothyroxine (SYNTHROID) 112 MCG tablet Take 112 mcg by mouth daily. 09/29/21   Yes [provider]  rivaroxaban (XARELTO) 20 MG TABS tablet Take 20 mg by mouth daily with supper.     Yes [provider]  valsartan (DIOVAN) 40 MG tablet Take 40 mg by mouth daily. 08/26/21   Yes [provider]            Current Outpatient Medications  Medication Sig Dispense Refill   amiodarone (PACERONE) 200  MG tablet Take 100 mg by mouth daily.       furosemide (LASIX) 20 MG tablet Take 40 mg by mouth daily.       KLOR-CON M10 10 MEQ tablet Take 10 mEq by mouth daily.       levothyroxine (SYNTHROID) 112 MCG tablet Take 112 mcg by mouth daily.       rivaroxaban (XARELTO) 20 MG TABS tablet Take 20 mg by mouth daily with supper.       valsartan (DIOVAN) 40 MG tablet Take 40 mg by mouth daily.        No current facility-administered medications for this visit.      No Known Allergies       Review of Systems:               General:                      normal appetite, + decreased energy, no weight gain, no weight loss, no fever             Cardiac:                       no chest pain with exertion, no chest pain at rest, +SOB with mild exertion, no resting SOB, no PND, no orthopnea, no palpitations, + arrhythmia, + atrial fibrillation, + has had LE edema resolved on lasix, no dizzy spells, no syncope             Respiratory:                 + exertional shortness of breath, no home oxygen, no productive cough, no dry cough, no bronchitis, no wheezing, no hemoptysis, no asthma, no pain with inspiration or cough, no sleep apnea, no CPAP at night             GI:                               no difficulty swallowing, n reflux, ono frequent heartburn, no hiatal hernia, no abdominal pain, no constipation, no diarrhea, no hematochezia, no hematemesis, no melena             GU:  no dysuria,  no frequency, no urinary tract infection, no hematuria, no kidney stones, no kidney disease             Vascular:                     no pain suggestive of claudication, no pain in feet, no leg cramps, no varicose veins, no DVT, no non-healing foot ulcer             Neuro:                         no stroke, no TIA's, no seizures, no headaches, no temporary blindness one eye,  no slurred speech, no peripheral neuropathy, no chronic pain, no instability of gait, no memory/cognitive dysfunction              Musculoskeletal:         no arthritis, no joint swelling, no myalgias, no difficulty walking, normal mobility              Skin:                            no rash, no itching, no skin infections, no pressure sores or ulcerations             Psych:                         no anxiety, no depression, no nervousness, no unusual recent stress             Eyes:                           no blurry vision, no floaters, no recent vision changes, + wears glasses            ENT:                            no hearing loss, no loose or painful teeth, + dentures with two of her own teeth.             Hematologic:               no easy bruising, no abnormal bleeding, no clotting disorder, no frequent epistaxis             Endocrine:                   no diabetes, does not check CBG's at home                            Physical Exam:               BP (!) 203/76 (BP Location: Right Arm, Patient Position: Sitting)   Pulse (!) 50   Resp 18   Ht 5\' 3"  (1.6 m)   Wt 192 lb (87.1 kg)   SpO2 94% Comment: RA  BMI 34.01 kg/m              General:                      Elderly but  well-appearing             HEENT:  Unremarkable, NCAT, PERLA, EOMI             Neck:                           no JVD, no bruits, no adenopathy              Chest:                          clear to auscultation, symmetrical breath sounds, no wheezes, no rhonchi              CV:                              RRR, 3/6 systolic murmur RSB, no diastolic murmur             Abdomen:                    soft, non-tender, no masses              Extremities:                 warm, well-perfused, pulses palpable at ankles, no lower extremity edema             Rectal/GU                   Deferred             Neuro:                         Grossly non-focal and symmetrical throughout             Skin:                            Clean and dry, no rashes, no breakdown   Diagnostic Tests:   ECHOCARDIOGRAM REPORT          Patient Name:   TANYRA POTTINGER Date of Exam: 10/15/2021  Medical Rec #:  [id number]            Height:       63.0 in  Accession #:    [id number]           Weight:       195.8 lb  Date of Birth:  Sep 27, 1940             BSA:          1.916 m  Patient Age:    89 years             BP:           222/84 mmHg  Patient Gender: F                    HR:           53 bpm.  Exam Location:  [facility]   Procedure: 2D Echo, Cardiac Doppler, Color Doppler and Strain Analysis   Indications:    Severe aortic stenosis [I35.0 (ICD-10-CM)]     History:        Patient has prior history of Echocardiogram examinations,  most                  recent 05/08/2021. Aortic Valve Disease, Arrythmias:Atrial  Fibrillation; Signs/Symptoms:Dyspnea,  Dizziness/Lightheadedness                  and Shortness of Breath.     Sonographer:    Louie Boston RDCS  Referring Phys: Rito Ehrlich [facility]   IMPRESSIONS     1. Left ventricular ejection fraction, by estimation, is 65 to 70%. The  left ventricle has normal function. The left ventricle has no regional  wall motion abnormalities. There is moderate left ventricular hypertrophy.  Left ventricular diastolic  parameters are consistent with Grade II diastolic dysfunction  (pseudonormalization).   2. Right ventricular systolic function is normal. The right ventricular  size is normal. There is mildly elevated pulmonary artery systolic  pressure.   3. Left atrial size was severely dilated.   4. Right atrial size was moderately dilated.   5. The mitral valve is normal in structure. Mild to moderate mitral valve  regurgitation. Mild mitral stenosis. The mean mitral valve gradient is 4.0  mmHg.   6. DI 0.16, SVi 40. The aortic valve is calcified. Aortic valve  regurgitation is mild. Severe aortic valve stenosis. Aortic valve area, by  VTI measures 0.52 cm. Aortic valve mean gradient measures 44.0 mmHg.   7. The inferior vena cava is normal in size  with greater than 50%  respiratory variability, suggesting right atrial pressure of 3 mmHg.   FINDINGS   Left Ventricle: Left ventricular ejection fraction, by estimation, is 65  to 70%. The left ventricle has normal function. The left ventricle has no  regional wall motion abnormalities. The left ventricular internal cavity  size was normal in size. There is   moderate left ventricular hypertrophy. Left ventricular diastolic  parameters are consistent with Grade II diastolic dysfunction  (pseudonormalization).   Right Ventricle: The right ventricular size is normal. No increase in  right ventricular wall thickness. Right ventricular systolic function is  normal. There is mildly elevated pulmonary artery systolic pressure. The  tricuspid regurgitant velocity is 2.91   m/s, and with an assumed right atrial pressure of 8 mmHg, the estimated  right ventricular systolic pressure is 41.9 mmHg.   Left Atrium: Left atrial size was severely dilated.   Right Atrium: Right atrial size was moderately dilated.   Pericardium: There is no evidence of pericardial effusion.   Mitral Valve: The mitral valve is normal in structure. There is mild  thickening of the mitral valve leaflet(s). There is moderate calcification  of the mitral valve leaflet(s). Mild mitral annular calcification. Mild to  moderate mitral valve  regurgitation. Mild mitral valve stenosis. MV peak gradient, 12.5 mmHg.  The mean mitral valve gradient is 4.0 mmHg.   Tricuspid Valve: The tricuspid valve is normal in structure. Tricuspid  valve regurgitation is not demonstrated. No evidence of tricuspid  stenosis.   Aortic Valve: DI 0.16, SVi 40. The aortic valve is calcified. Aortic valve  regurgitation is mild. Aortic regurgitation PHT measures 438 msec. Severe  aortic stenosis is present. Aortic valve mean gradient measures 44.0 mmHg.  Aortic valve peak gradient  measures 69.9 mmHg. Aortic valve area, by VTI measures 0.52  cm.   Pulmonic Valve: The pulmonic valve was normal in structure. Pulmonic valve  regurgitation is not visualized. No evidence of pulmonic stenosis.   Aorta: The aortic root is normal in size and structure.   Venous: The inferior vena cava is normal in size with greater than 50%  respiratory variability, suggesting right atrial pressure of 3 mmHg.   IAS/Shunts: No  atrial level shunt detected by color flow Doppler.      LEFT VENTRICLE  PLAX 2D  LVIDd:         5.20 cm   Diastology  LVIDs:         3.40 cm   LV e' medial:    3.87 cm/s  LV PW:         1.50 cm   LV E/e' medial:  43.9  LV IVS:        1.50 cm   LV e' lateral:   4.75 cm/s  LVOT diam:     2.00 cm   LV E/e' lateral: 35.8  LV SV:         76  LV SV Index:   40  LVOT Area:     3.14 cm      RIGHT VENTRICLE            IVC  RV S prime:     9.36 cm/s  IVC diam: 2.30 cm  TAPSE (M-mode): 4.0 cm   LEFT ATRIUM              Index        RIGHT ATRIUM           Index  LA diam:        4.30 cm  2.24 cm/m   RA Area:     23.70 cm  LA Vol (A2C):   104.0 ml 54.27 ml/m  RA Volume:   77.10 ml  40.23 ml/m  LA Vol (A4C):   80.2 ml  41.85 ml/m  LA Biplane Vol: 93.0 ml  48.53 ml/m   AORTIC VALVE  AV Area (Vmax):    0.63 cm  AV Area (Vmean):   0.61 cm  AV Area (VTI):     0.52 cm  AV Vmax:           418.00 cm/s  AV Vmean:          308.000 cm/s  AV VTI:            1.470 m  AV Peak Grad:      69.9 mmHg  AV Mean Grad:      44.0 mmHg  LVOT Vmax:         83.50 cm/s  LVOT Vmean:        59.900 cm/s  LVOT VTI:          0.242 m  LVOT/AV VTI ratio: 0.16  AI PHT:            438 msec     AORTA  Ao Root diam: 3.50 cm  Ao Asc diam:  3.70 cm  Ao Desc diam: 2.20 cm   MITRAL VALVE                  TRICUSPID VALVE  MV Area (PHT): 3.51 cm       TR Peak grad:   33.9 mmHg  MV Area VTI:   1.32 cm       TR Vmax:        291.00 cm/s  MV Peak grad:  12.5 mmHg  MV Mean grad:  4.0 mmHg       SHUNTS  MV Vmax:       1.77 m/s       Systemic VTI:   0.24 m  MV Vmean:      86.8 cm/s      Systemic Diam: 2.00 cm  MV Decel Time: 216 msec  MR Peak grad:    165.9 mmHg  MR Mean grad:    99.0 mmHg  MR Vmax:         644.00 cm/s  MR Vmean:        469.0 cm/s  MR PISA:         2.65 cm  MR PISA Eff ROA: 16 mm  MR PISA Radius:  0.65 cm  MV E velocity: 170.00 cm/s  MV A velocity: 73.10 cm/s  MV E/A ratio:  2.33   Jenne Campus MD  Electronically signed by Jenne Campus MD  Signature Date/Time: 10/15/2021/12:42:07 PM         Final       Physicians   Panel Physicians Referring Physician Case Authorizing Physician  Burnell Blanks, MD (Primary)        Procedures   RIGHT HEART CATH AND CORONARY ANGIOGRAPHY    Conclusion       Prox RCA lesion is 20% stenosed.   Dist RCA lesion is 20% stenosed.   RPDA lesion is 30% stenosed.   RPAV lesion is 30% stenosed.   Prox Cx lesion is 30% stenosed.   Ramus lesion is 99% stenosed.   Mid LAD lesion is 60% stenosed.   2nd Diag lesion is 70% stenosed.   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   The LAD has moderate mid vessel stenosis. The small diagonal branch has a moderately severe stenosis The Circumflex has mild non-obstructive disease. The small caliber intermediate branch has severe stenosis but is too small for PCI The RCA is a large dominant artery with mild non-obstructive disease in the proximal and distal vessel. The PDA and posterolateral branches have mild non-obstructive disease.  Severe aortic stenosis by echo. I did not cross the aortic valve today.  Elevated right heart pressures   Recommendations: Will continue workup for TAVR. Medical management of CAD. I will have her increase her Lasix to 40 mg daily.        Indications   Severe aortic stenosis [I35.0 (ICD-10-CM)]    Procedural Details   Technical Details Indication: 81 yo female with severe AS, workup for TAVR  Procedure: The risks, benefits, complications, treatment  options, and expected outcomes were discussed with the patient. The patient and/or family concurred with the proposed plan, giving informed consent. The patient was brought to the cath lab after IV hydration was given. The patient was sedated with Versed and Fentanyl. The IV catheter in the left antecubital vein was changed for a 5 French sheath. Right heart catheterization performed with a balloon tipped catheter. The right wrist was prepped and draped in a sterile fashion. 1% lidocaine was used for local anesthesia. Using the modified Seldinger access technique, a 5 French sheath was placed in the right radial artery. 3 mg Verapamil was given through the sheath. Weight based IV heparin was given. Standard diagnostic catheters were used to perform selective coronary angiography. I did not cross the aortic valve. All catheter exchanges were performed over an exchange length guidewire.   The sheath was removed from the right radial artery and a Terumo hemostasis band was applied at the arteriotomy site on the right wrist.   Of note, the left hand peripheral IV that was placed in the pre-op area infiltrated during the case. The IV was removed and pressure was held.  Estimated blood loss <50 mL.   During this procedure medications were administered to achieve and maintain moderate conscious sedation while the patient's heart rate, blood  pressure, and oxygen saturation were continuously monitored and I was present face-to-face 100% of this time.    Medications (Filter: Administrations occurring from [id number] to 1002 on 10/31/21) Heparin (Porcine) in NaCl 1000-0.9 UT/500ML-% SOLN (mL) Total volume:  1,000 mL  Date/Time Rate/Dose/Volume Action    10/31/21 0849 500 mL Given    0849 500 mL Given      midazolam (VERSED) injection (mg) Total dose:  1 mg  Date/Time Rate/Dose/Volume Action    10/31/21 0901 1 mg Given      fentaNYL (SUBLIMAZE) injection (mcg) Total dose:  25 mcg  Date/Time Rate/Dose/Volume  Action    10/31/21 0901 25 mcg Given      lidocaine (PF) (XYLOCAINE) 1 % injection (mL) Total volume:  4 mL  Date/Time Rate/Dose/Volume Action    10/31/21 0914 2 mL Given    0916 2 mL Given      Radial Cocktail/Verapamil only (mL) Total volume:  10 mL  Date/Time Rate/Dose/Volume Action    10/31/21 0924 10 mL Given      heparin sodium (porcine) injection (Units) Total dose:  4,500 Units  Date/Time Rate/Dose/Volume Action    10/31/21 0931 4,500 Units Given      iohexol (OMNIPAQUE) 350 MG/ML injection (mL) Total volume:  80 mL  Date/Time Rate/Dose/Volume Action    10/31/21 0946 80 mL Given      Sedation Time   Sedation Time Physician-1: 42 minutes 29 seconds Contrast   Medication Name Total Dose  iohexol (OMNIPAQUE) 350 MG/ML injection 80 mL    Radiation/Fluoro   Fluoro time: 8.7 (min) DAP: 16467 (mGycm2) Cumulative Air Kerma: 269 (mGy) Coronary Findings   Diagnostic Dominance: Right Left Anterior Descending  Vessel is large.  Mid LAD lesion is 60% stenosed.    Second Diagonal Branch  Vessel is small in size.  2nd Diag lesion is 70% stenosed.    Ramus Intermedius  Vessel is small.  Ramus lesion is 99% stenosed.    Left Circumflex  Vessel is large.  Prox Cx lesion is 30% stenosed.    Right Coronary Artery  Vessel is large.  Prox RCA lesion is 20% stenosed.  Dist RCA lesion is 20% stenosed.    Right Posterior Descending Artery  RPDA lesion is 30% stenosed.    Right Posterior Atrioventricular Artery  RPAV lesion is 30% stenosed.    Intervention    No interventions have been documented.    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    Coronary Diagrams   Diagnostic Dominance: Right  Intervention   Implants      No implant documentation for this case.    Syngo Images    Show images for CARDIAC CATHETERIZATION Images on Long Term Storage    Show images for Alayia, Rena to Procedure Log   Procedure Log    Hemo Data   Flowsheet Row Most Recent Value  Fick Cardiac Output 6.4 L/min  Fick Cardiac Output Index 3.37 (L/min)/BSA  RA A Wave 14 mmHg  RA V Wave 9 mmHg  RA Mean 9 mmHg  RV Systolic Pressure 49 mmHg  RV Diastolic Pressure 4 mmHg  RV EDP 20 mmHg  PA Systolic Pressure 55 mmHg  PA Diastolic Pressure 20 mmHg  PA Mean 35 mmHg  PW A Wave 29 mmHg  PW V Wave 39 mmHg  PW Mean 27 mmHg  AO Systolic Pressure [id number] mmHg  AO Diastolic Pressure 54 mmHg  AO Mean  86 mmHg  QP/QS 1  TPVR Index 10.39 HRUI  TSVR Index 25.53 HRUI  PVR SVR Ratio 0.1  TPVR/TSVR Ratio 0.41      ADDENDUM REPORT: 11/12/2021 19:49   CLINICAL DATA:  Severe Aortic Stenosis.   EXAM: Cardiac TAVR CT   TECHNIQUE: A non-contrast, gated CT scan was obtained with axial slices of 3 mm through the heart for aortic valve calcium scoring. A 120 kV retrospective, gated, contrast cardiac scan was obtained. Gantry rotation speed was 250 msecs and collimation was 0.6 mm. Nitroglycerin was not given. The 3D data set was reconstructed in 5% intervals of the 0-95% of the R-R cycle. Systolic and diastolic phases were analyzed on a dedicated workstation using MPR, MIP, and VRT modes. The patient received 100 cc of contrast.   FINDINGS: Image quality: Excellent.   Noise artifact is: Limited.   Valve Morphology: Tricuspid aortic valve. Severe calcifications that are diffuse. Severely restricted leaflet movement in systole.   Aortic Valve Calcium score: 1403   Aortic annular dimension:   Phase assessed: 20%   Annular area: 326 mm2   Annular perimeter: 65.1 mm   Max diameter: 22.8 mm   Min diameter: 18.8 mm   Annular and subannular calcification: None.   Membranous septum length: 5.2 mm   Optimal coplanar projection: RAO 2 CRA 6   Coronary Artery Height above Annulus:   Left Main: 11.1 mm   Right Coronary: 12.4 mm   Sinus of Valsalva Measurements:    Non-coronary: 30 mm   Right-coronary: 31 mm   Left-coronary: 31 mm   Sinus of Valsalva Height:   Non-coronary: 16.5 mm   Right-coronary: 18.9 mm   Left-coronary: 19.1 mm   Sinotubular Junction: 29 mm with moderate calcifications.   Ascending Thoracic Aorta: 36 mm   Coronary Arteries: Normal coronary origin. Right dominance. The study was performed without use of NTG and is insufficient for plaque evaluation. Please refer to recent cardiac catheterization for coronary assessment. 3-vessel coronary calcifications.   Cardiac Morphology:   Right Atrium: Right atrial size is within normal limits.   Right Ventricle: The right ventricular cavity is within normal limits.   Left Atrium: Left atrial size is normal in size with no left atrial appendage filling defect.   Left Ventricle: The ventricular cavity size is within normal limits. There are no stigmata of prior infarction. There is no abnormal filling defect.   Pulmonary arteries: Dilated suggestive of pulmonary hypertension.   Pulmonary veins: Normal pulmonary venous drainage.   Pericardium: Normal thickness with no significant effusion or calcium present.   Mitral Valve: The mitral valve is degenerative with severe caseating mitral annular calcium.   Extra-cardiac findings: See attached radiology report for non-cardiac structures.   IMPRESSION: 1. Tricuspid aortic valve with severe aortic stenosis.   2. Small annular measurements noted (326 mm2). Would consider 26 mm Evolut Pro. Aortic root and sinus heights favorable for self-expanding valve. Moderate, nearly circumferential calcium noted at the STJ.   3. Small membranous septum (5.2 mm).   4. No significant annular or subannular calcifications.   5. Sufficient coronary to annulus distance.   6. Optimal Fluoroscopic Angle for Delivery: RAO 2 CRA 6   7. Dilated pulmonary artery suggestive of pulmonary hypertension.   8. Severe, caseating mitral  annular calcium noted.   Lake Bells T. Audie Box, MD     Electronically Signed   By: Eleonore Chiquito M.D.   On: 11/12/2021 19:49     Narrative & Impression  CLINICAL  DATA:  Preop evaluation for TAVR.   EXAM: CTA ABDOMEN AND PELVIS WITHOUT AND WITH CONTRAST   TECHNIQUE: Multidetector CT imaging of the abdomen and pelvis was performed using the standard protocol during bolus administration of intravenous contrast. Multiplanar reconstructed images and MIPs were obtained and reviewed to evaluate the vascular anatomy.   RADIATION DOSE REDUCTION: This exam was performed according to the departmental dose-optimization program which includes automated exposure control, adjustment of the mA and/or kV according to patient size and/or use of iterative reconstruction technique.   CONTRAST:  164mL OMNIPAQUE IOHEXOL 350 MG/ML SOLN   COMPARISON:  None Available.   FINDINGS: CTA CHEST FINDINGS   Cardiovascular: Mild cardiomegaly. No pericardial effusion. Moderate three-vessel coronary artery calcifications. Aortic valve calcifications and thickening. Severe atherosclerotic disease of the thoracic aorta. Standard three-vessel aortic arch with no significant narrowing.   Mediastinum/Nodes: Small hiatal hernia. Thyroid is unremarkable. No pathologically enlarged lymph nodes seen in the chest.   Lungs/Pleura: Central airways are patent. No consolidation effusion or pneumothorax. Multiple small solid pulmonary nodules. Nodule of the right middle lobe measuring 4 mm on series 7, image 43. Additional nodules are seen in the superior portion of the right lower lobe measuring 3 mm on image 43.   Musculoskeletal: No chest wall abnormality. No acute or significant osseous findings.   CTA ABDOMEN AND PELVIS FINDINGS   Hepatobiliary: No suspicious liver lesions. Numerous gallstones, no evidence of gallbladder wall thickening. No biliary ductal dilation.   Pancreas: Unremarkable. No pancreatic  ductal dilatation or surrounding inflammatory changes.   Spleen: Normal in size without focal abnormality.   Adrenals/Urinary Tract: Bilateral adrenal glands are unremarkable. No hydronephrosis or nephrolithiasis. No suspicious renal lesions. Bladder is unremarkable.   Stomach/Bowel: Stomach is within normal limits. Diverticulosis. No evidence of bowel wall thickening, distention, or inflammatory changes.   Vascular/lymphatic: No pathologically enlarged nodes seen in the abdomen or pelvis. Normal caliber abdominal aorta with moderate atherosclerotic disease. Mild narrowing at the origin of the celiac and SMA due to calcified and noncalcified plaque IMA is patent. Bilateral renal arteries are patent with no significant narrowing   Reproductive: Fibroid uterus with large calcified fibroid of the uterine body measuring up to 6.8 cm.   Other: No abdominal wall hernia or abnormality. No abdominopelvic ascites.   Musculoskeletal: No acute or significant osseous findings.   VASCULAR MEASUREMENTS PERTINENT TO TAVR:   AORTA:   Minimal Aortic Diameter-12.3 mm   Severity of Aortic Calcification-moderate   RIGHT PELVIS:   Right Common Iliac Artery -   Minimal Diameter-9.6 mm   Tortuosity-moderate   Calcification-mild   Right External Iliac Artery -   Minimal Diameter-3.3 mm   Tortuosity-mild   Calcification-none   Right Common Femoral Artery -   Minimal Diameter-7.3 mm   Tortuosity-none   Calcification-mild   LEFT PELVIS:   Left Common Iliac Artery -   Minimal Diameter-7.9 mm   Tortuosity-severe   Calcification-mild   Left External Iliac Artery -   Minimal Diameter-8.5 mm   Tortuosity-mild   Calcification-none   Left Common Femoral Artery -   Minimal Diameter-7.0 mm   Tortuosity-none   Calcification-mild   Review of the MIP images confirms the above findings.   IMPRESSION: 1. Vascular findings and measurements pertinent to potential  TAVR procedure, as detailed above. 2. Severe thickening calcification of the aortic valve, compatible with reported clinical history of severe aortic stenosis. 3. 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 Three vessel coronary artery disease. 4. Small  solid pulmonary nodules, largest measures 4 mm in the right middle lobe. No follow-up needed if patient is low-risk (and has no known or suspected primary neoplasm). Non-contrast chest CT can be considered in 12 months if patient is high-risk. This recommendation follows the consensus statement: Guidelines for Management of Incidental Pulmonary Nodules Detected on CT Images: From the Fleischner Society 2017; Radiology 2017; 284:228-243.     Electronically Signed   By: Yetta Glassman M.D.   On: 11/11/2021 16:04          Impression:   This 81 year old woman has stage D, severe, symptomatic aortic stenosis with New York Heart Association class II symptoms of exertional fatigue and shortness of breath consistent with chronic diastolic congestive heart failure.  I have personally reviewed her 2D echocardiogram, cardiac catheterization, and CTA studies.  Her echocardiogram shows a severely calcified and restricted aortic valve with a mean gradient of 44 mmHg and a valve area of 0.5 cm consistent with severe aortic stenosis.  Left ventricular systolic function is normal.  Cardiac catheterization shows a 60% mid LAD stenosis.  The left circumflex has mild nonobstructive disease.  There is a small ramus branch with a 99% stenosis.  The RCA is a large dominant vessel with mild nonobstructive disease in the proximal and distal vessel.  She is not having any chest discomfort and her coronary disease can be treated medically.  I agree that aortic valve replacement is indicated in this patient for relief of her symptoms and to prevent left ventricular dysfunction.  Given her age and diffuse calcification of her ascending aorta  I think that transcatheter aortic valve replacement would be the only option for treating her.  Her gated cardiac CTA shows a small aortic annulus but her anatomy is suitable for TAVR using a Medtronic Evolut FX valve.  Her abdominal and pelvic CTA shows adequate pelvic vascular anatomy to allow transfemoral insertion.   The patient was counseled at length regarding treatment alternatives for management of severe symptomatic aortic stenosis. The risks and benefits of surgical intervention has been discussed in detail. Long-term prognosis with medical therapy was discussed. Alternative approaches such as conventional surgical aortic valve replacement, transcatheter aortic valve replacement, and palliative medical therapy were compared and contrasted at length. This discussion was placed in the context of the patient's own specific clinical presentation and past medical history. All of their questions have been addressed.    Following the decision to proceed with transcatheter aortic valve replacement, a discussion was held regarding what types of management strategies would be attempted intraoperatively in the event of life-threatening complications, including whether or not the patient would be considered a candidate for the use of cardiopulmonary bypass and/or conversion to open sternotomy for attempted surgical intervention.  I do not think she is a candidate for emergent sternotomy to manage any intraoperative complications since she has diffuse calcification of her ascending aorta.  The patient is aware of the fact that transient use of cardiopulmonary bypass may be necessary. The patient has been advised of a variety of complications that might develop including but not limited to risks of death, stroke, paravalvular leak, aortic dissection or other major vascular complications, aortic annulus rupture, device embolization, cardiac rupture or perforation, mitral regurgitation, acute myocardial infarction,  arrhythmia, heart block or bradycardia requiring permanent pacemaker placement, congestive heart failure, respiratory failure, renal failure, pneumonia, infection, other late complications related to structural valve deterioration or migration, or other complications that might ultimately cause a temporary or permanent loss  of functional independence or other long term morbidity. The patient provides full informed consent for the procedure as described and all questions were answered.     Plan:   Transfemoral TAVR using a Medtronic valve.    Gaye Pollack, MD

## 2022-02-10 ENCOUNTER — Encounter (HOSPITAL_COMMUNITY)
Admission: RE | Disposition: A | Discharge status: Home / Self Care | Payer: Self-pay | Source: Home / Self Care | Attending: Surgery

## 2022-02-10 ENCOUNTER — Inpatient Hospital Stay (HOSPITAL_COMMUNITY): Payer: Medicare PPO | Admitting: Certified Registered Nurse Anesthetist

## 2022-02-10 ENCOUNTER — Inpatient Hospital Stay (HOSPITAL_COMMUNITY): Payer: Medicare PPO

## 2022-02-10 ENCOUNTER — Other Ambulatory Visit: Payer: Self-pay | Admitting: Physician Assistant

## 2022-02-10 ENCOUNTER — Inpatient Hospital Stay (HOSPITAL_COMMUNITY)
Admission: RE | Admit: 2022-02-10 | Discharge: 2022-02-11 | DRG: 267 | Disposition: A | Discharge status: Home / Self Care | Payer: Medicare PPO | Attending: Surgery | Admitting: Surgery

## 2022-02-10 ENCOUNTER — Encounter (HOSPITAL_COMMUNITY): Payer: Self-pay | Admitting: Cardiovascular Disease

## 2022-02-10 DIAGNOSIS — Z79890 Hormone replacement therapy (postmenopausal): Secondary | ICD-10-CM

## 2022-02-10 DIAGNOSIS — Z952 Presence of prosthetic heart valve: Secondary | ICD-10-CM

## 2022-02-10 DIAGNOSIS — I350 Nonrheumatic aortic (valve) stenosis: Principal | ICD-10-CM

## 2022-02-10 DIAGNOSIS — I083 Combined rheumatic disorders of mitral, aortic and tricuspid valves: Secondary | ICD-10-CM | POA: Diagnosis not present

## 2022-02-10 DIAGNOSIS — I272 Pulmonary hypertension, unspecified: Secondary | ICD-10-CM | POA: Diagnosis present

## 2022-02-10 DIAGNOSIS — K449 Diaphragmatic hernia without obstruction or gangrene: Secondary | ICD-10-CM | POA: Diagnosis not present

## 2022-02-10 DIAGNOSIS — R918 Other nonspecific abnormal finding of lung field: Secondary | ICD-10-CM | POA: Diagnosis present

## 2022-02-10 DIAGNOSIS — Z006 Encounter for examination for normal comparison and control in clinical research program: Secondary | ICD-10-CM

## 2022-02-10 DIAGNOSIS — E039 Hypothyroidism, unspecified: Secondary | ICD-10-CM | POA: Diagnosis present

## 2022-02-10 DIAGNOSIS — E785 Hyperlipidemia, unspecified: Secondary | ICD-10-CM | POA: Diagnosis present

## 2022-02-10 DIAGNOSIS — I5032 Chronic diastolic (congestive) heart failure: Secondary | ICD-10-CM | POA: Diagnosis present

## 2022-02-10 DIAGNOSIS — Z8249 Family history of ischemic heart disease and other diseases of the circulatory system: Secondary | ICD-10-CM

## 2022-02-10 DIAGNOSIS — I2510 Atherosclerotic heart disease of native coronary artery without angina pectoris: Secondary | ICD-10-CM | POA: Diagnosis present

## 2022-02-10 DIAGNOSIS — Z954 Presence of other heart-valve replacement: Secondary | ICD-10-CM | POA: Diagnosis not present

## 2022-02-10 DIAGNOSIS — I447 Left bundle-branch block, unspecified: Secondary | ICD-10-CM | POA: Diagnosis present

## 2022-02-10 DIAGNOSIS — I10 Essential (primary) hypertension: Secondary | ICD-10-CM | POA: Diagnosis present

## 2022-02-10 DIAGNOSIS — Z7901 Long term (current) use of anticoagulants: Secondary | ICD-10-CM

## 2022-02-10 DIAGNOSIS — I110 Hypertensive heart disease with heart failure: Secondary | ICD-10-CM | POA: Diagnosis present

## 2022-02-10 DIAGNOSIS — Z79899 Other long term (current) drug therapy: Secondary | ICD-10-CM

## 2022-02-10 DIAGNOSIS — I517 Cardiomegaly: Secondary | ICD-10-CM | POA: Diagnosis not present

## 2022-02-10 DIAGNOSIS — I480 Paroxysmal atrial fibrillation: Secondary | ICD-10-CM | POA: Diagnosis present

## 2022-02-10 HISTORY — PX: TRANSCATHETER AORTIC VALVE REPLACEMENT, TRANSFEMORAL: SHX6400

## 2022-02-10 HISTORY — PX: INTRAOPERATIVE TRANSTHORACIC ECHOCARDIOGRAM: SHX6523

## 2022-02-10 LAB — POCT I-STAT, CHEM 8
BUN: 30 mg/dL — ABNORMAL HIGH (ref 8–23)
BUN: 29 mg/dL — ABNORMAL HIGH (ref 8–23)
BUN: 29 mg/dL — ABNORMAL HIGH (ref 8–23)
BUN: 27 mg/dL — ABNORMAL HIGH (ref 8–23)
Calcium, Ion: 1.15 mmol/L (ref 1.15–1.40)
Calcium, Ion: 1.21 mmol/L (ref 1.15–1.40)
Calcium, Ion: 1.21 mmol/L (ref 1.15–1.40)
Calcium, Ion: 1.19 mmol/L (ref 1.15–1.40)
Chloride: 106 mmol/L (ref 98–111)
Chloride: 106 mmol/L (ref 98–111)
Chloride: 104 mmol/L (ref 98–111)
Chloride: 106 mmol/L (ref 98–111)
Creatinine, Ser: 1.3 mg/dL — ABNORMAL HIGH (ref 0.44–1.00)
Creatinine, Ser: 1.2 mg/dL — ABNORMAL HIGH (ref 0.44–1.00)
Creatinine, Ser: 1.3 mg/dL — ABNORMAL HIGH (ref 0.44–1.00)
Creatinine, Ser: 1.2 mg/dL — ABNORMAL HIGH (ref 0.44–1.00)
Glucose, Bld: 127 mg/dL — ABNORMAL HIGH (ref 70–99)
Glucose, Bld: 140 mg/dL — ABNORMAL HIGH (ref 70–99)
Glucose, Bld: 142 mg/dL — ABNORMAL HIGH (ref 70–99)
Glucose, Bld: 126 mg/dL — ABNORMAL HIGH (ref 70–99)
HCT: 36 % (ref 36.0–46.0)
HCT: 36 % (ref 36.0–46.0)
HCT: 37 % (ref 36.0–46.0)
HCT: 33 % — ABNORMAL LOW (ref 36.0–46.0)
Hemoglobin: 12.2 g/dL (ref 12.0–15.0)
Hemoglobin: 12.2 g/dL (ref 12.0–15.0)
Hemoglobin: 12.6 g/dL (ref 12.0–15.0)
Hemoglobin: 11.2 g/dL — ABNORMAL LOW (ref 12.0–15.0)
Potassium: 3.8 mmol/L (ref 3.5–5.1)
Potassium: 4 mmol/L (ref 3.5–5.1)
Potassium: 4 mmol/L (ref 3.5–5.1)
Potassium: 3.7 mmol/L (ref 3.5–5.1)
Sodium: 141 mmol/L (ref 135–145)
Sodium: 140 mmol/L (ref 135–145)
Sodium: 141 mmol/L (ref 135–145)
Sodium: 141 mmol/L (ref 135–145)
TCO2: 26 mmol/L (ref 22–32)
TCO2: 26 mmol/L (ref 22–32)
TCO2: 26 mmol/L (ref 22–32)
TCO2: 24 mmol/L (ref 22–32)

## 2022-02-10 LAB — ECHOCARDIOGRAM LIMITED
AR max vel: 1.29 cm2
AV Area VTI: 1.44 cm2
AV Area mean vel: 1.27 cm2
AV Mean grad: 12 mmHg
AV Peak grad: 20.8 mmHg
Ao pk vel: 2.28 m/s

## 2022-02-10 LAB — ABO/RH: ABO/RH(D): A NEG

## 2022-02-10 SURGERY — IMPLANTATION, AORTIC VALVE, TRANSCATHETER, FEMORAL APPROACH
Anesthesia: Monitor Anesthesia Care

## 2022-02-10 MED ORDER — IOHEXOL 350 MG/ML SOLN
INTRAVENOUS | Status: DC | PRN
  Administered 2022-02-10: 30 mL

## 2022-02-10 MED ORDER — AMLODIPINE BESYLATE 10 MG PO TABS
10.0000 mg | ORAL_TABLET | Freq: Every day | ORAL | Status: DC
  Administered 2022-02-10 – 2022-02-11 (×2): 10 mg via ORAL
  Filled 2022-02-10 (×2): qty 1

## 2022-02-10 MED ORDER — GLYCOPYRROLATE PF 0.2 MG/ML IJ SOSY
PREFILLED_SYRINGE | INTRAMUSCULAR | Status: DC | PRN
  Administered 2022-02-10 (×2): .2 mg via INTRAVENOUS

## 2022-02-10 MED ORDER — NITROGLYCERIN IN D5W 200-5 MCG/ML-% IV SOLN
0.0000 ug/min | INTRAVENOUS | Status: DC
  Administered 2022-02-10: 40 ug/min via INTRAVENOUS

## 2022-02-10 MED ORDER — LIDOCAINE HCL (PF) 1 % IJ SOLN
INTRAMUSCULAR | Status: AC
  Filled 2022-02-10: qty 30

## 2022-02-10 MED ORDER — SODIUM CHLORIDE 0.9 % IV SOLN: INTRAVENOUS | Status: AC

## 2022-02-10 MED ORDER — ONDANSETRON HCL 4 MG/2ML IJ SOLN
4.0000 mg | Freq: Four times a day (QID) | INTRAMUSCULAR | Status: DC | PRN
  Administered 2022-02-10 – 2022-02-11 (×2): 4 mg via INTRAVENOUS
  Filled 2022-02-10 (×2): qty 2

## 2022-02-10 MED ORDER — EPHEDRINE SULFATE-NACL 50-0.9 MG/10ML-% IV SOSY
PREFILLED_SYRINGE | INTRAVENOUS | Status: DC | PRN
  Administered 2022-02-10 (×2): 5 mg via INTRAVENOUS

## 2022-02-10 MED ORDER — FENTANYL CITRATE (PF) 100 MCG/2ML IJ SOLN
INTRAMUSCULAR | Status: DC | PRN
  Administered 2022-02-10: 25 ug via INTRAVENOUS

## 2022-02-10 MED ORDER — CLEVIDIPINE BUTYRATE 0.5 MG/ML IV EMUL
INTRAVENOUS | Status: AC
  Filled 2022-02-10: qty 50

## 2022-02-10 MED ORDER — PROTAMINE SULFATE 10 MG/ML IV SOLN
INTRAVENOUS | Status: DC | PRN
  Administered 2022-02-10: 120 mg via INTRAVENOUS

## 2022-02-10 MED ORDER — NITROGLYCERIN IN D5W 200-5 MCG/ML-% IV SOLN
INTRAVENOUS | Status: AC
Start: 2022-02-10 — End: ?
  Filled 2022-02-10: qty 250

## 2022-02-10 MED ORDER — HYDRALAZINE HCL 20 MG/ML IJ SOLN: 5.0000 mg | Freq: Once | INTRAMUSCULAR | Status: AC

## 2022-02-10 MED ORDER — SODIUM CHLORIDE 0.9 % IV SOLN
250.0000 mL | INTRAVENOUS | Status: DC | PRN
  Administered 2022-02-11: 250 mL via INTRAVENOUS

## 2022-02-10 MED ORDER — SODIUM CHLORIDE 0.9% FLUSH
3.0000 mL | Freq: Two times a day (BID) | INTRAVENOUS | Status: DC
  Administered 2022-02-11: 3 mL via INTRAVENOUS

## 2022-02-10 MED ORDER — ATROPINE SULFATE 0.4 MG/ML IV SOLN
INTRAVENOUS | Status: DC | PRN
  Administered 2022-02-10: .2 mg via INTRAVENOUS

## 2022-02-10 MED ORDER — LEVOTHYROXINE SODIUM 112 MCG PO TABS
112.0000 ug | ORAL_TABLET | Freq: Every day | ORAL | Status: DC
  Administered 2022-02-11: 112 ug via ORAL
  Filled 2022-02-10: qty 1

## 2022-02-10 MED ORDER — CHLORHEXIDINE GLUCONATE 0.12 % MT SOLN
15.0000 mL | Freq: Once | OROMUCOSAL | Status: AC
  Administered 2022-02-10: 15 mL via OROMUCOSAL
  Filled 2022-02-10: qty 15

## 2022-02-10 MED ORDER — FENTANYL CITRATE (PF) 100 MCG/2ML IJ SOLN: 25.0000 ug | INTRAMUSCULAR | Status: DC | PRN

## 2022-02-10 MED ORDER — HYDRALAZINE HCL 20 MG/ML IJ SOLN
5.0000 mg | Freq: Once | INTRAMUSCULAR | Status: AC
  Administered 2022-02-10: 5 mg via INTRAVENOUS

## 2022-02-10 MED ORDER — IRBESARTAN 150 MG PO TABS
75.0000 mg | ORAL_TABLET | Freq: Every day | ORAL | Status: DC
Start: 2022-02-10 — End: 2022-02-11
  Administered 2022-02-10 – 2022-02-11 (×2): 75 mg via ORAL
  Filled 2022-02-10 (×2): qty 1

## 2022-02-10 MED ORDER — IRBESARTAN 75 MG PO TABS: 75.0000 mg | ORAL_TABLET | Freq: Every day | ORAL | Status: DC

## 2022-02-10 MED ORDER — CLEVIDIPINE BUTYRATE 0.5 MG/ML IV EMUL
INTRAVENOUS | Status: DC | PRN
  Administered 2022-02-10: 2 mg/h via INTRAVENOUS

## 2022-02-10 MED ORDER — LIDOCAINE HCL 1 % IJ SOLN
INTRAMUSCULAR | Status: DC | PRN
  Administered 2022-02-10 (×2): 15 mL

## 2022-02-10 MED ORDER — CEFAZOLIN SODIUM-DEXTROSE 2-4 GM/100ML-% IV SOLN
2.0000 g | Freq: Three times a day (TID) | INTRAVENOUS | Status: AC
  Administered 2022-02-10 (×2): 2 g via INTRAVENOUS
  Filled 2022-02-10 (×2): qty 100

## 2022-02-10 MED ORDER — ACETAMINOPHEN 10 MG/ML IV SOLN: 1000.0000 mg | Freq: Once | INTRAVENOUS | Status: DC | PRN

## 2022-02-10 MED ORDER — MORPHINE SULFATE (PF) 2 MG/ML IV SOLN: 1.0000 mg | INTRAVENOUS | Status: DC | PRN

## 2022-02-10 MED ORDER — CHLORHEXIDINE GLUCONATE 4 % EX LIQD: 30.0000 mL | CUTANEOUS | Status: DC

## 2022-02-10 MED ORDER — RIVAROXABAN 20 MG PO TABS: 20.0000 mg | ORAL_TABLET | Freq: Every day | ORAL | Status: DC

## 2022-02-10 MED ORDER — HEPARIN (PORCINE) IN NACL 1000-0.9 UT/500ML-% IV SOLN
INTRAVENOUS | Status: DC | PRN
  Administered 2022-02-10 (×2): 500 mL

## 2022-02-10 MED ORDER — TRAMADOL HCL 50 MG PO TABS: 50.0000 mg | ORAL_TABLET | ORAL | Status: DC | PRN

## 2022-02-10 MED ORDER — FUROSEMIDE 40 MG PO TABS
40.0000 mg | ORAL_TABLET | Freq: Every day | ORAL | Status: DC
Start: 2022-02-10 — End: 2022-02-11
  Administered 2022-02-10 – 2022-02-11 (×2): 40 mg via ORAL
  Filled 2022-02-10 (×2): qty 1

## 2022-02-10 MED ORDER — AMIODARONE HCL 100 MG PO TABS
100.0000 mg | ORAL_TABLET | Freq: Every day | ORAL | Status: DC
  Administered 2022-02-11: 100 mg via ORAL
  Filled 2022-02-10 (×2): qty 1

## 2022-02-10 MED ORDER — CHLORHEXIDINE GLUCONATE 4 % EX LIQD: 60.0000 mL | Freq: Once | CUTANEOUS | Status: DC

## 2022-02-10 MED ORDER — OXYCODONE HCL 5 MG PO TABS: 5.0000 mg | ORAL_TABLET | ORAL | Status: DC | PRN

## 2022-02-10 MED ORDER — ACETAMINOPHEN 325 MG PO TABS: 650.0000 mg | ORAL_TABLET | Freq: Four times a day (QID) | ORAL | Status: DC | PRN

## 2022-02-10 MED ORDER — LACTATED RINGERS IV SOLN: INTRAVENOUS | Status: DC

## 2022-02-10 MED ORDER — HEPARIN SODIUM (PORCINE) 1000 UNIT/ML IJ SOLN
INTRAMUSCULAR | Status: DC | PRN
  Administered 2022-02-10: 12000 [IU] via INTRAVENOUS

## 2022-02-10 MED ORDER — PROPOFOL 500 MG/50ML IV EMUL
INTRAVENOUS | Status: DC | PRN
  Administered 2022-02-10: 10 ug/kg/min via INTRAVENOUS

## 2022-02-10 MED ORDER — POTASSIUM CHLORIDE CRYS ER 20 MEQ PO TBCR
10.0000 meq | EXTENDED_RELEASE_TABLET | Freq: Every day | ORAL | Status: DC
  Administered 2022-02-11: 10 meq via ORAL
  Filled 2022-02-10: qty 1

## 2022-02-10 MED ORDER — ACETAMINOPHEN 650 MG RE SUPP: 650.0000 mg | Freq: Four times a day (QID) | RECTAL | Status: DC | PRN

## 2022-02-10 MED ORDER — HYDRALAZINE HCL 20 MG/ML IJ SOLN
INTRAMUSCULAR | Status: AC
  Administered 2022-02-10: 5 mg via INTRAVENOUS
  Filled 2022-02-10: qty 1

## 2022-02-10 MED ORDER — SODIUM CHLORIDE 0.9 % IV SOLN: INTRAVENOUS | Status: DC

## 2022-02-10 MED ORDER — SODIUM CHLORIDE 0.9% FLUSH: 3.0000 mL | INTRAVENOUS | Status: DC | PRN

## 2022-02-10 MED ORDER — FENTANYL CITRATE (PF) 100 MCG/2ML IJ SOLN
INTRAMUSCULAR | Status: AC
  Filled 2022-02-10: qty 2

## 2022-02-10 SURGICAL SUPPLY — 38 items
BAG SNAP BAND KOVER 36X36 (MISCELLANEOUS) ×2 IMPLANT
BALLN TRUE 18X4.5 (BALLOONS) ×1
BALLOON TRUE 18X4.5 (BALLOONS) IMPLANT
CABLE ADAPT PACING TEMP 12FT (ADAPTER) IMPLANT
CATH DIAG 6FR PIGTAIL ANGLED (CATHETERS) IMPLANT
CATH INFINITI 6F AL1 (CATHETERS) IMPLANT
CATH S G BIP PACING (CATHETERS) IMPLANT
CLOSURE MYNX CONTROL 6F/7F (Vascular Products) IMPLANT
CLOSURE PERCLOSE PROSTYLE (VASCULAR PRODUCTS) IMPLANT
DEVICE INFLATION ATRION QL2530 (MISCELLANEOUS) IMPLANT
DRYSEAL FLEXSHEATH 18FR 33CM (SHEATH) ×1
GLOVE ECLIPSE 7.0 STRL STRAW (GLOVE) ×1 IMPLANT
GUIDEWIRE CNFDA BRKR CVD (WIRE) IMPLANT
KIT HEART LEFT (KITS) ×1 IMPLANT
KIT MICROPUNCTURE NIT STIFF (SHEATH) IMPLANT
PACK CARDIAC CATHETERIZATION (CUSTOM PROCEDURE TRAY) ×1 IMPLANT
SHEATH BRITE TIP 7FR 35CM (SHEATH) IMPLANT
SHEATH DRYSEAL FLEX 18FR 33CM (SHEATH) IMPLANT
SHEATH PINNACLE 6F 10CM (SHEATH) IMPLANT
SHEATH PINNACLE 8F 10CM (SHEATH) IMPLANT
SHEATH PROBE COVER 6X72 (BAG) IMPLANT
SLEEVE REPOSITIONING LENGTH 30 (MISCELLANEOUS) IMPLANT
STOPCOCK MORSE 400PSI 3WAY (MISCELLANEOUS) ×2 IMPLANT
SYR MEDRAD MARK V 150ML (SYRINGE) IMPLANT
SYS EVOLUT FX DELIVERY 23-29 (CATHETERS) ×1
SYS EVOLUT FX LOADING 23-29 (CATHETERS) ×1
SYSTEM EVOLUT FX DELIVRY 23-29 (CATHETERS) IMPLANT
SYSTEM EVOLUT FX LOADING 23-29 (CATHETERS) IMPLANT
TRANSDUCER W/STOPCOCK (MISCELLANEOUS) ×2 IMPLANT
TUBING ART PRESS 72  MALE/FEM (TUBING) ×1
TUBING ART PRESS 72 MALE/FEM (TUBING) IMPLANT
TUBING CIL FLEX 10 FLL-RA (TUBING) IMPLANT
TUBING CONTRAST HIGH PRESS 48 (TUBING) IMPLANT
VALVE EVOLUT FX 26 (Valve) IMPLANT
WIRE AMPLATZ SS-J .035X180CM (WIRE) IMPLANT
WIRE EMERALD 3MM-J .035X150CM (WIRE) IMPLANT
WIRE EMERALD 3MM-J .035X260CM (WIRE) IMPLANT
WIRE EMERALD ST .035X260CM (WIRE) ×1 IMPLANT

## 2022-02-10 NOTE — Op Note (Signed)
[facility]   MULTIDISCIPLINARY HEART VALVE TEAM     TAVR OPERATIVE NOTE   NAME@ [id number]  Date of Procedure:                 02/10/2022   Preoperative Diagnosis:      Severe Aortic Stenosis    Postoperative Diagnosis:    Same    Procedure:        Transcatheter Aortic Valve Replacement - Percutaneous Right Transfemoral Approach             Medtronic Evolut FX  (size 26 mm, serial # [id number])              Co-Surgeons:            Alleen Borne, MD and Tonny Bollman, MD     Anesthesiologist:                  Lavenia Atlas, DO   Echocardiographer:              Ruby Cola, MD   Pre-operative Echo Findings: Severe aortic stenosis  Normal left ventricular systolic function   Post-operative Echo Findings: Trivial paravalvular leak Normal left ventricular systolic function      BRIEF CLINICAL NOTE AND INDICATIONS FOR SURGERY    This 81 year old woman has stage D, severe, symptomatic aortic stenosis with New York Heart Association class II symptoms of exertional fatigue and shortness of breath consistent with chronic diastolic congestive heart failure.  I have personally reviewed her 2D echocardiogram, cardiac catheterization, and CTA studies.  Her echocardiogram shows a severely calcified and restricted aortic valve with a mean gradient of 44 mmHg and a valve area of 0.5 cm consistent with severe aortic stenosis.  Left ventricular systolic function is normal.  Cardiac catheterization shows a 60% mid LAD stenosis.  The left circumflex has mild nonobstructive disease.  There is a small ramus branch with a 99% stenosis.  The RCA is a large dominant vessel with mild nonobstructive disease in the proximal and distal vessel.  She is not having any chest discomfort and her coronary disease can be treated medically.  I agree that aortic valve replacement is indicated in this patient for relief of her symptoms and to prevent left ventricular dysfunction.  Given her age and  diffuse calcification of her ascending aorta I think that transcatheter aortic valve replacement would be the only option for treating her.  Her gated cardiac CTA shows a small aortic annulus but her anatomy is suitable for TAVR using a Medtronic Evolut FX valve.  Her abdominal and pelvic CTA shows adequate pelvic vascular anatomy to allow transfemoral insertion.   The patient was counseled at length regarding treatment alternatives for management of severe symptomatic aortic stenosis. The risks and benefits of surgical intervention has been discussed in detail. Long-term prognosis with medical therapy was discussed. Alternative approaches such as conventional surgical aortic valve replacement, transcatheter aortic valve replacement, and palliative medical therapy were compared and contrasted at length. This discussion was placed in the context of the patient's own specific clinical presentation and past medical history. All of their questions have been addressed.    Following the decision to proceed with transcatheter aortic valve replacement, a discussion was held regarding what types of management strategies would be attempted intraoperatively in the event of life-threatening complications, including whether or not the patient would be considered a candidate for the use of cardiopulmonary bypass and/or conversion to open sternotomy for attempted  surgical intervention.  I do not think she is a candidate for emergent sternotomy to manage any intraoperative complications since she has diffuse calcification of her ascending aorta.  The patient is aware of the fact that transient use of cardiopulmonary bypass may be necessary. The patient has been advised of a variety of complications that might develop including but not limited to risks of death, stroke, paravalvular leak, aortic dissection or other major vascular complications, aortic annulus rupture, device embolization, cardiac rupture or perforation, mitral  regurgitation, acute myocardial infarction, arrhythmia, heart block or bradycardia requiring permanent pacemaker placement, congestive heart failure, respiratory failure, renal failure, pneumonia, infection, other late complications related to structural valve deterioration or migration, or other complications that might ultimately cause a temporary or permanent loss of functional independence or other long term morbidity. The patient provides full informed consent for the procedure as described and all questions were answered.        DETAILS OF THE OPERATIVE PROCEDURE    PREPARATION:    The patient was brought to the operating room on the above mentioned date and appropriate monitoring was established by the anesthesia team. The patient was placed in the supine position on the operating table.  Intravenous antibiotics were administered. The patient was monitored closely throughout the procedure under conscious sedation.    Baseline transthoracic echocardiogram was performed. The patient's abdomen and both groins were prepped and draped in a sterile manner. A time out procedure was performed.   PERIPHERAL ACCESS:    Using the modified Seldinger technique, femoral arterial and venous access was obtained with placement of 6 Fr sheaths on the left side.  A pigtail diagnostic catheter was passed through the left arterial sheath under fluoroscopic guidance into the aortic root.  A temporary transvenous pacemaker catheter was passed through the left femoral venous sheath under fluoroscopic guidance into the right ventricle.  The pacemaker was tested to ensure stable lead placement and pacemaker capture.     TRANSFEMORAL ACCESS:    Percutaneous transfemoral access and sheath placement was performed using ultrasound guidance.  The right common femoral artery was cannulated using a micropuncture needle.  A pair of Abbott Perclose percutaneous closure devices were placed and a 6 French sheath replaced into  the femoral artery.  The patient was heparinized systemically and ACT verified > 250 seconds.     An 18 Fr transfemoral Gore Dry-Seal sheath was introduced into the right femoral artery after progressively dilating over an Amplatz superstiff wire. An AL-1 catheter was used to direct a straight-tip exchange length wire across the native aortic valve into the left ventricle. This was exchanged out for a pigtail catheter and position was confirmed in the LV apex. Simultaneous LV and Ao pressures were recorded.  The pigtail catheter was exchanged for a Safari wire in the LV apex.    BALLOON AORTIC VALVULOPLASTY:    Not performed   TRANSCATHETER HEART VALVE DEPLOYMENT:    A Medtronic Evolut FX transcatheter heart valve (size 26 mm) was prepared and loaded into the delivery catheter system per manufacturer's guidelines and the proper orientation of the valve is confirmed under fluoroscopy. The delivery system and inline sheath were inserted into the right common femoral artery over the [facility] wire and the inline sheath advanced into the abdominal aorta under fluoroscopic guidance. The delivery catheter was advanced around the aortic arch and the valve was carefully positioned across the aortic valve annulus. An aortic root injection was performed to confirm position and the valve deployed  using cusp overlap technique under fluoroscopic guidance. Intermittent pacing was used during valve deployment. The delivery system and guidewire were retracted into the descending aorta and the nosecone re-sheathed. Valve function was assessed using echocardiography. There is felt to be trivial paravalvular leak and no central aortic insufficiency. The patient's hemodynamic recovery following valve deployment is good.        PROCEDURE COMPLETION:    The delivery system and in-line sheath were removed and femoral artery closure performed using the Perclose devices.  Protamine was administered once femoral arterial repair  was complete. The temporary pacemaker, pigtail catheters and femoral sheaths were removed with manual pressure used venous for hemostasis and a Mynx closure device for contralateral arterial hemostasis.    The patient tolerated the procedure well and is transported to the cath lab recovery area in stable condition. There were no immediate intraoperative complications. All sponge instrument and needle counts are verified correct at completion of the operation.    No blood products were administered during the operation.   The patient received a total of 30 mL of intravenous contrast during the procedure.     Alleen Borne, MD

## 2022-02-10 NOTE — Progress Notes (Addendum)
  [facility]   MULTIDISCIPLINARY HEART VALVE TEAM  Patient doing well s/p TAVR. She is hemodynamically stable, but BP elevated. Will resume home meds now. Groin sites stable. 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 Arterial line discontinued and transferred to 4E. Plan for early ambulation after bedrest completed and hopeful discharge over the next 24-48 hours.    ADDENDUM 3:45: BP still high requiring a nitro gtt. Will start norvasc 10 mg daily now.    Cline Crock PA-C  [facility]  Pager [phone]

## 2022-02-10 NOTE — Anesthesia Procedure Notes (Signed)
Procedure Name: MAC Date/Time: 02/10/2022 9:19 AM  Performed by: Colin Benton, CRNAPre-anesthesia Checklist: Patient identified, Emergency Drugs available, Suction available and Patient being monitored Patient Re-evaluated:Patient Re-evaluated prior to induction Oxygen Delivery Method: Simple face mask Induction Type: IV induction Placement Confirmation: positive ETCO2 Dental Injury: Teeth and Oropharynx as per pre-operative assessment

## 2022-02-10 NOTE — Discharge Summary (Addendum)
[place] VALVE TEAM  Discharge Summary    Patient ID: Rebekah Pope MRN: [id number]; DOB: 1941/01/11  Admit date: 02/10/2022 Discharge date: 02/11/2022  Primary Care Provider: Rhea Bleacher, NP  Primary Cardiologist: Jenean Lindau, MD / Dr. Burt Knack & Dr. Cyndia Bent (TAVR)  Discharge Diagnoses    Principal Problem:   S/P TAVR (transcatheter aortic valve replacement) Active Problems:   Essential (primary) hypertension   Hyperlipidemia   Hypothyroidism   Paroxysmal atrial fibrillation (HCC)   Severe aortic stenosis   Allergies No Known Allergies  Diagnostic Studies/Procedures    TAVR OPERATIVE NOTE    Date of Procedure:                 02/10/2022   Preoperative Diagnosis:      Severe Aortic Stenosis    Postoperative Diagnosis:    Same    Procedure:        Transcatheter Aortic Valve Replacement - Percutaneous Right Transfemoral Approach             Medtronic Evolut FX  (size 26 mm, serial # [id number])              Co-Surgeons:            Gaye Pollack, MD and Sherren Mocha, MD     Anesthesiologist:                  Maryclare Bean, DO   Echocardiographer:              Bertrum Sol, MD   Pre-operative Echo Findings: Severe aortic stenosis  Normal left ventricular systolic function   Post-operative Echo Findings: Trivial paravalvular leak Normal left ventricular systolic function  _____________    Echo 02/11/22: completed but pending formal read at the time of discharge   History of Present Illness     Rebekah Pope is a 81 y.o. female with a history of HTN, HLD, hypothyroidism, PAF on amio and Xarelto, LBBB, chronic diastolic CHF and severe aortic stenosis who presented to [facility] on 02/10/22 for planned TAVR.   She was seen by Dr. Geraldo Pitter and underwent a 2D echocardiogram on 10/15/2021 which showed EF 65%, severe AS with mean grad 44 mmHg, peak grad 69.9 mmHg, AVA 0.5 cm2, DVI 0.16, SVI 40, mild to moderate mitral  regurgitation with mild mitral stenosis. 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 Medical therapy was recommended. She had elevated right heart pressures and Lasix was increased to 40 mg daily. She reported progressive exertional shortness of breath and fatigue.  She had mild lower extremity swelling which has resolved with Lasix.   The patient has been evaluated by the multidisciplinary valve team and felt to have severe, symptomatic aortic stenosis and to be a suitable candidate for TAVR, which was set up for 02/10/22.  Hospital Course     Consultants: none   Severe AS: s/p successful TAVR with a 26 mm Evolut FX THV via the TF approach on 02/10/25. Post operative echo completed but pending formal read. Groin sites are stable. ECG with sinus and old LBBB and no high grade heart block. Resumed on home Xarelto. Plan for discharge home today with close follow up in the office next week.  HTN: BP has been elevated requiring IV nitro. Norvasc 20m daily added to her home valsartan and lasix. She will keep a log at home and bring to office next week.   PAF: continue on amiodarone. Resumed  on home Xarelto 74m daily. Pharmacy calculated creat clearance to be 47 which is right below cut off of 50. She is borderline but qualifies for a dose reduction. Will let her finish the 24mtablets she has left and call in next Rx for 1566maily.   LBBB: stable with no new conduction disturbance.   Chronic diastolic CHF: appears euvolemic. Continue on home lasix at current dosing.   Pulmonary nodules: pre TAVR CT showed "small solid pulmonary nodules, largest measures 4 mm in the right middle lobe. No follow-up needed if patient is low-risk (and has no known or suspected primary neoplasm). Non-contrast chest CT can be considered in 12 months if patient is high-risk" This will be discussed in the outpatient setting.  _____________  Discharge Vitals Blood pressure (!) 129/46, pulse 63, temperature 98.3 F (36.8  C), temperature source Oral, resp. rate 17, weight 86.1 kg, SpO2 96 %.  Filed Weights   02/11/22 0517  Weight: 86.1 kg    Labs & Radiologic Studies    CBC Recent Labs    02/10/22 1150 02/11/22 0339  WBC  --  9.0  HGB 11.2* 11.8*  HCT 33.0* 35.7*  MCV  --  88.1  PLT  --  [id number] Metabolic Panel Recent Labs    02/10/22 1150 02/11/22 0339  NA 141 139  K 3.7 3.7  CL 106 104  CO2  --  24  GLUCOSE 126* 113*  BUN 27* 21  CREATININE 1.20* 1.18*  CALCIUM  --  8.7*  MG  --  1.9   Liver Function Tests No results for input(s): "AST", "ALT", "ALKPHOS", "BILITOT", "PROT", "ALBUMIN" in the last 72 hours. No results for input(s): "LIPASE", "AMYLASE" in the last 72 hours. Cardiac Enzymes No results for input(s): "CKTOTAL", "CKMB", "CKMBINDEX", "TROPONINI" in the last 72 hours. BNP Invalid input(s): "POCBNP" D-Dimer No results for input(s): "DDIMER" in the last 72 hours. Hemoglobin A1C No results for input(s): "HGBA1C" in the last 72 hours. Fasting Lipid Panel No results for input(s): "CHOL", "HDL", "LDLCALC", "TRIG", "CHOLHDL", "LDLDIRECT" in the last 72 hours. Thyroid Function Tests No results for input(s): "TSH", "T4TOTAL", "T3FREE", "THYROIDAB" in the last 72 hours.  Invalid input(s): "FREET3" _____________  ECHOCARDIOGRAM LIMITED  Result Date: 02/10/2022    ECHOCARDIOGRAM LIMITED REPORT   Patient Name:   Rebekah KNITTELte of Exam: 02/10/2022 Medical Rec #:  [id number]         Height:       63.0 in Accession #:    [id number]        Weight:       187.8 lb Date of Birth:  2/2September 10, 1942          BSA:          1.883 m Patient Age:    [age]ars             BP:           209/52 mmHg Patient Gender: F                    HR:           55 bpm. Exam Location:  Inpatient Procedure: Limited Echo, Color Doppler and Cardiac Doppler Indications:     Aortic Stenosis i35.0  History:         Patient has prior history of Echocardiogram examinations, most  recent  10/15/2021. Arrythmias:Atrial Fibrillation; Risk                  Factors:Hypertension and Dyslipidemia.  Sonographer:     Raquel Sarna Senior RDCS Referring Phys:  [place] Diagnosing Phys: Sanda Klein MD  Sonographer Comments: 17m Medtronic Evolut Pro TAVR Implanted  PRE-PROCEDURE FINDINGS Normal left ventricular systolic function and regional wall motion. Estimated LVEF 65-70% Trileaflet aortic valve with severe calcific stenosis. Aortic valve peak 81 gradient mm Hg, mean gradient 47 mm Hg, dimensionless index 0.20, calculated valve area 0.46 cm sq (0.24 cm sq/m sq BSA). Moderate aortic insufficiency. Moderate, centrally directed mitral insufficiency No pericardial effusion. POST-PROCEDURE FINDINGS Hyperdynamic left ventricular systolic function and normal regional wall motion. Estimated LVEF 75% Well deployed stent valve (26 mm Metronic Evolut Pro TAVR). Aortic valve peak gradient 21 mm Hg, mean gradient 12 mm Hg, dimensionless index 0.57, calculated valve area 1.44 cm sq (0.77 cm sq/m sq BSA), acceleration time 84 ms. No intravalvular aortic insufficiency. There is a trivial perivalvular leak towards the mitral fibrosa. There appears to be a reduction in mitral insufficiency severity, now mild. No pericardial effusion.  IMPRESSIONS  1. Left ventricular ejection fraction, by estimation, is 65 to 70%. The left ventricle has normal function. The left ventricle has no regional wall motion abnormalities. There is moderate concentric left ventricular hypertrophy.  2. Left atrial size was mild to moderately dilated.  3. The mitral valve is normal in structure. Moderate mitral valve regurgitation. No evidence of mitral stenosis. Severe mitral annular calcification.  4. There is severe calcifcation of the aortic valve. There is severe thickening of the aortic valve. Aortic valve regurgitation is moderate. FINDINGS  Left Ventricle: Left ventricular ejection fraction, by estimation, is 65 to 70%. The left ventricle  has normal function. The left ventricle has no regional wall motion abnormalities. The left ventricular internal cavity size was normal in size. There is  moderate concentric left ventricular hypertrophy. Left Atrium: Left atrial size was mild to moderately dilated. Pericardium: There is no evidence of pericardial effusion. Mitral Valve: The mitral valve is normal in structure. Severe mitral annular calcification. Moderate mitral valve regurgitation, with centrally-directed jet. No evidence of mitral valve stenosis. Tricuspid Valve: The tricuspid valve is normal in structure. Aortic Valve: There is severe calcifcation of the aortic valve. There is severe thickening of the aortic valve. Aortic valve regurgitation is moderate. Aortic valve mean gradient measures 12.0 mmHg. Aortic valve peak gradient measures 20.8 mmHg. Aortic valve area, by VTI measures 1.44 cm. Pulmonic Valve: The pulmonic valve was not well visualized. Aorta: The aortic root is normal in size and structure. LEFT VENTRICLE PLAX 2D LVOT diam:     1.80 cm LV SV:         79 LV SV Index:   42 LVOT Area:     2.54 cm  AORTIC VALVE AV Area (Vmax):    1.29 cm AV Area (Vmean):   1.27 cm AV Area (VTI):     1.44 cm AV Vmax:           228.00 cm/s AV Vmean:          166.000 cm/s AV VTI:            0.546 m AV Peak Grad:      20.8 mmHg AV Mean Grad:      12.0 mmHg LVOT Vmax:         116.00 cm/s LVOT Vmean:  82.800 cm/s LVOT VTI:          0.309 m LVOT/AV VTI ratio: 0.57 MITRAL VALVE MV Peak grad: 8.1 mmHg SHUNTS MV Vmax:      1.42 m/s Systemic VTI:  0.31 m                        Systemic Diam: 1.80 cm Sanda Klein MD Electronically signed by Sanda Klein MD Signature Date/Time: 02/10/2022/2:33:08 PM    Final    Structural Heart Procedure  Result Date: 02/10/2022 See surgical note for result.  DG Chest 2 View  Result Date: 02/06/2022 CLINICAL DATA:  Preop aortic stenosis EXAM: CHEST - 2 VIEW COMPARISON:  CT 11/11/2021, radiograph 09/26/2021  FINDINGS: Mild cardiomegaly. No acute airspace disease or effusion. Aortic atherosclerosis. No pneumothorax IMPRESSION: No active cardiopulmonary disease.  Mild cardiomegaly Electronically Signed   By: Donavan Foil M.D.   On: 02/06/2022 21:20    Disposition   Pt is being discharged home today in good condition.  Follow-up Plans & Appointments     Follow-up Information     Eileen Stanford, PA-C. Go on 02/18/2022.   Specialties: Cardiology, Radiology Why: @ 2:30pm, please arrive at least 10 min early Contact information: [place] [id number] [phone]                Discharge Instructions     Amb Referral to Cardiac Rehabilitation   Complete by: As directed    Diagnosis: Valve Replacement   Valve: Aortic   After initial evaluation and assessments completed: Virtual Based Care may be provided alone or in conjunction with Phase 2 Cardiac Rehab based on patient barriers.: Yes   Intensive Cardiac Rehabilitation (ICR) [place] location only OR Traditional Cardiac Rehabilitation (TCR) If criteria for ICR are not met will enroll in TCR [facility] only): Yes       Discharge Medications   Allergies as of 02/11/2022   No Known Allergies      Medication List     TAKE these medications    amiodarone 200 MG tablet Commonly known as: PACERONE Take 100 mg by mouth daily.   furosemide 40 MG tablet Commonly known as: LASIX Take 1 tablet (40 mg total) by mouth daily.   Klor-Con M10 10 MEQ tablet Generic drug: potassium chloride Take 10 mEq by mouth daily.   levothyroxine 112 MCG tablet Commonly known as: SYNTHROID Take 112 mcg by mouth daily before breakfast.   rivaroxaban 20 MG Tabs tablet Commonly known as: XARELTO Take 20 mg by mouth daily with supper.   valsartan 40 MG tablet Commonly known as: DIOVAN Take 40 mg by mouth daily.        Outstanding Labs/Studies   none  Duration of Discharge Encounter   Greater than 30 minutes  including physician time.  SignedAngelena Form, PA-C 02/11/2022, 10:31 AM [phone]

## 2022-02-10 NOTE — Op Note (Signed)
[facility]   MULTIDISCIPLINARY HEART VALVE TEAM   TAVR OPERATIVE NOTE   Date of Procedure:  02/10/2022   Preoperative Diagnosis: Severe Aortic Stenosis   Postoperative Diagnosis: Same   Procedure:   Transcatheter Aortic Valve Replacement - Percutaneous  Transfemoral Approach  Medtronic Evolut Pro-Plus (size 26 mm, serial # [id number])   Co-Surgeons:  Alleen Borne, MD and Tonny Bollman, MD  Anesthesiologist:  Hester Mates, DO  Echocardiographer:  Thurmon Fair, MD  Pre-operative Echo Findings: Severe aortic stenosis Normal/vigorous left ventricular systolic function  Post-operative Echo Findings: trace paravalvular leak unchanged left ventricular systolic function  BRIEF CLINICAL NOTE AND INDICATIONS FOR SURGERY  81 year old woman with hypertension, hyperlipidemia, and paroxysmal atrial fibrillation, who has developed severe symptomatic stage D1 aortic stenosis.  After undergoing multidisciplinary team review, she is felt to be an appropriate candidate for TAVR with a self-expanding Medtronic Evolut FX valve via transfemoral approach.  During the course of the patient's preoperative work up they have been evaluated comprehensively by a multidisciplinary team of specialists coordinated through the Multidisciplinary Heart Valve Clinic in the [facility].  They have been demonstrated to suffer from symptomatic severe aortic stenosis as noted above. The patient has been counseled extensively as to the relative risks and benefits of all options for the treatment of severe aortic stenosis including long term medical therapy, conventional surgery for aortic valve replacement, and transcatheter aortic valve replacement.  The patient has been independently evaluated in formal cardiac surgical consultation by Dr Laneta Simmers, who deemed the patient appropriate for TAVR. Based upon review of all of the patient's preoperative diagnostic tests they are  felt to be candidate for transcatheter aortic valve replacement using the transfemoral approach as an alternative to conventional surgery.    Following the decision to proceed with transcatheter aortic valve replacement, a discussion has been held regarding what types of management strategies would be attempted intraoperatively in the event of life-threatening complications, including whether or not the patient would be considered a candidate for the use of cardiopulmonary bypass and/or conversion to open sternotomy for attempted surgical intervention.  The patient has been advised of a variety of complications that might develop peculiar to this approach including but not limited to risks of death, stroke, paravalvular leak, aortic dissection or other major vascular complications, aortic annulus rupture, device embolization, cardiac rupture or perforation, acute myocardial infarction, arrhythmia, heart block or bradycardia requiring permanent pacemaker placement, congestive heart failure, respiratory failure, renal failure, pneumonia, infection, other late complications related to structural valve deterioration or migration, or other complications that might ultimately cause a temporary or permanent loss of functional independence or other long term morbidity.  The patient provides full informed consent for the procedure as described and all questions were answered preoperatively.  DETAILS OF THE OPERATIVE PROCEDURE  PREPARATION:   The patient is brought to the operating room on the above mentioned date and central monitoring was established by the anesthesia team including placement of a radial arterial line. The patient is placed in the supine position on the operating table.  Intravenous antibiotics are administered. The patient is monitored closely throughout the procedure under conscious sedation.  Baseline transthoracic echocardiogram is performed. The patient's chest, abdomen, both groins, and both  lower extremities are prepared and draped in a sterile manner. A time out procedure is performed.   PERIPHERAL ACCESS:   Using ultrasound guidance, femoral arterial and venous access is obtained with placement of 6 Fr sheaths on the  left side.  [name] images are captured and digitally stored in the patient's record. A pigtail diagnostic catheter was passed through the femoral arterial sheath under fluoroscopic guidance into the aortic root (non-coronary cusp).  A temporary transvenous pacemaker catheter was passed through the femoral venous sheath under fluoroscopic guidance into the right ventricle.  The pacemaker was tested to ensure stable lead placement and pacemaker capture.   TRANSFEMORAL ACCESS:  A micropuncture technique is used to access the right femoral artery under fluoroscopic and ultrasound guidance.  [name] images are captured and digitally stored in the patient's chart. 2 Perclose devices are deployed at 10' and 2' positions to 'PreClose' the femoral artery. An 8 French sheath is placed and then an Amplatz Superstiff wire is advanced through the sheath. This is changed out for an 18 Fr Dry-Seal sheath after progressively dilating over the Superstiff wire.  An AL-1 catheter was used to direct a straight-tip exchange length wire across the native aortic valve into the left ventricle. This was exchanged out for a pigtail catheter and position was confirmed in the LV apex.  Simultaneous LV and Ao pressures were recorded.  The pigtail catheter was exchanged for a Confida wire in the LV apex.    BALLOON AORTIC VALVULOPLASTY:  Performed under rapid ventricular pacing with an 18 mm true balloon.  The patient's hemodynamic recovery is brisk and she remained stable after balloon valvuloplasty.  TRANSCATHETER HEART VALVE DEPLOYMENT:  A Medtronic Evolut FX transcatheter heart valve (size 26 mm) was prepared and crimped per manufacturer's guidelines, and the proper orientation of the valve is confirmed on  the Medtronic delivery system. The valve was advanced through the introducer sheath and across the aortic arch over the Confida wire until it is positioned at the base of the pigtail catheter in the aortic valve annulus. Using the fine tuning wheel, valve deployment begins until annular contact is made. Controlled ventricular pacing is performed. Once proper position is confirmed via aortic root angiograms in the cusp overlap and LAO projections, the valve is deployed to 80% where it is fully functional. The patient's hemodynamic recovery following valve deployment is good.  Final position is confirmed and the valve is slowly deployed over 30 seconds until both paddles are released. The delivery catheter and Confida wire are removed and the valve is assessed with echocardiography. Echo demostrated acceptable post-procedural gradients, stable mitral valve function, and trace aortic insufficiency.    PROCEDURE COMPLETION:  The sheath was removed and femoral artery closure is performed using the 2 previously deployed Perclose devices.  Protamine is administered once femoral arterial repair was complete. The site is clear with no evidence of bleeding or hematoma after the sutures are tightened. The temporary pacemaker and pigtail catheters are removed. Mynx closure is used for contralateral femoral arterial hemostasis for the 6 Fr sheath.  The patient tolerated the procedure well and is transported to the recovery area in stable condition. There were no immediate intraoperative complications. All sponge instrument and needle counts are verified correct at completion of the operation.   The patient received a total of 30 mL of intravenous contrast during the procedure.   Tonny Bollman, MD 02/10/2022 11:51 AM

## 2022-02-10 NOTE — Progress Notes (Signed)
Patient ambulated half of the hall with the rolling walker and tolerated it well. The right groin dressing was oozing, pressure held for 10 mn, dressing changed, bleeding has stopped, we'll continue to monitor.

## 2022-02-10 NOTE — Anesthesia Procedure Notes (Signed)
Arterial Line Insertion Start/End9/10/2021 8:05 AM, 02/10/2022 8:10 AM Performed by: Waynard Edwards, CRNA, CRNA  Preanesthetic checklist: patient identified, IV checked, site marked, risks and benefits discussed, surgical consent, monitors and equipment checked, pre-op evaluation, timeout performed and anesthesia consent Lidocaine 1% used for infiltration Right, radial was placed Catheter size: 20 G Hand hygiene performed  and maximum sterile barriers used  Allen's test indicative of satisfactory collateral circulation Attempts: 1 Procedure performed without using ultrasound guided technique. Following insertion, dressing applied and Biopatch. Post procedure assessment: normal and unchanged  Patient tolerated the procedure well with no immediate complications.

## 2022-02-10 NOTE — Anesthesia Postprocedure Evaluation (Signed)
Anesthesia Post Note  Patient: Rebekah Pope  Procedure(s) Performed: Transcatheter Aortic Valve Replacement, Transfemoral INTRAOPERATIVE TRANSTHORACIC ECHOCARDIOGRAM     Patient location during evaluation: PACU Anesthesia Type: MAC Level of consciousness: awake and alert Pain management: pain level controlled Vital Signs Assessment: post-procedure vital signs reviewed and stable Respiratory status: spontaneous breathing, nonlabored ventilation, respiratory function stable and patient connected to nasal cannula oxygen Cardiovascular status: stable and blood pressure returned to baseline Postop Assessment: no apparent nausea or vomiting Anesthetic complications: no   There were no known notable events for this encounter.  Last Vitals:  Vitals:   02/10/22 1340 02/10/22 1411  BP: (!) 152/52 (!) 158/60  Pulse: (!) 42 (!) 41  Resp: 12 14  Temp:    SpO2: 94% 99%    Last Pain:  Vitals:   02/10/22 1411  TempSrc: Oral  PainSc:                  March Rummage Brendin Situ

## 2022-02-10 NOTE — Plan of Care (Signed)
POC initiated and progressing. 

## 2022-02-10 NOTE — Interval H&P Note (Signed)
History and Physical Interval Note:  02/10/2022 9:33 AM  Rebekah Pope  has presented today for surgery, with the diagnosis of Severe Aortic Stenosis.  The various methods of treatment have been discussed with the patient and family. After consideration of risks, benefits and other options for treatment, the patient has consented to  Procedure(s): Transcatheter Aortic Valve Replacement, Transfemoral (N/A) INTRAOPERATIVE TRANSTHORACIC ECHOCARDIOGRAM (N/A) as a surgical intervention.  The patient's history has been reviewed, patient examined, no change in status, stable for surgery.  I have reviewed the patient's chart and labs.  Questions were answered to the patient's satisfaction.     Alleen Borne

## 2022-02-10 NOTE — Transfer of Care (Signed)
Immediate Anesthesia Transfer of Care Note  Patient: Rebekah Pope  Procedure(s) Performed: Transcatheter Aortic Valve Replacement, Transfemoral INTRAOPERATIVE TRANSTHORACIC ECHOCARDIOGRAM  Patient Location: Cath Lab  Anesthesia Type:MAC  Level of Consciousness: drowsy  Airway & Oxygen Therapy: Patient Spontanous Breathing and Patient connected to nasal cannula oxygen  Post-op Assessment: Report given to RN and Post -op Vital signs reviewed and stable  Post vital signs: Reviewed and stable  Last Vitals:  Vitals Value Taken Time  BP 165/52 02/10/22 1116  Temp    Pulse 51 02/10/22 1117  Resp 19 02/10/22 1117  SpO2 95 % 02/10/22 1117  Vitals shown include unvalidated device data.  Last Pain:  Vitals:   02/10/22 0737  PainSc: 0-No pain      Patients Stated Pain Goal: 0 (29/56/21 3086)  Complications: There were no known notable events for this encounter.

## 2022-02-10 NOTE — Progress Notes (Signed)
Mobility Specialist: Progress Note   02/10/22 1731  Mobility  Activity Ambulated with assistance in hallway  Level of Assistance Minimal assist, patient does 75% or more  Assistive Device Front wheel walker  Distance Ambulated (ft) 250 ft  Activity Response Tolerated well  $Mobility charge 1 Mobility   Pre-Mobility: 42 HR, 171/60 (91) BP, 99% SpO2 Post-Mobility: 48 HR, 164/68 (97) BP, 98% SpO2  Pt received in the bed and agreeable to mobility. Frequent urination upon standing requiring assist with pericare x2 before ambulation. MinA to stand and contact guard during ambulation. Pt to the recliner after session with call bell and phone in reach.   [facility] B[place]on Zhen Mobility Specialist Mobility Specialist [address]: [phone]

## 2022-02-10 NOTE — Discharge Instructions (Signed)

## 2022-02-11 ENCOUNTER — Inpatient Hospital Stay (HOSPITAL_COMMUNITY): Payer: Medicare PPO

## 2022-02-11 ENCOUNTER — Encounter (HOSPITAL_COMMUNITY): Payer: Self-pay | Admitting: Cardiovascular Disease

## 2022-02-11 DIAGNOSIS — Z006 Encounter for examination for normal comparison and control in clinical research program: Secondary | ICD-10-CM | POA: Diagnosis not present

## 2022-02-11 DIAGNOSIS — I272 Pulmonary hypertension, unspecified: Secondary | ICD-10-CM | POA: Diagnosis not present

## 2022-02-11 DIAGNOSIS — Z954 Presence of other heart-valve replacement: Secondary | ICD-10-CM | POA: Diagnosis not present

## 2022-02-11 DIAGNOSIS — Z952 Presence of prosthetic heart valve: Secondary | ICD-10-CM | POA: Diagnosis not present

## 2022-02-11 DIAGNOSIS — I5032 Chronic diastolic (congestive) heart failure: Secondary | ICD-10-CM | POA: Diagnosis not present

## 2022-02-11 DIAGNOSIS — I350 Nonrheumatic aortic (valve) stenosis: Secondary | ICD-10-CM | POA: Diagnosis not present

## 2022-02-11 LAB — CBC
HCT: 35.7 % — ABNORMAL LOW (ref 36.0–46.0)
Hemoglobin: 11.8 g/dL — ABNORMAL LOW (ref 12.0–15.0)
MCH: 29.1 pg (ref 26.0–34.0)
MCHC: 33.1 g/dL (ref 30.0–36.0)
MCV: 88.1 fL (ref 80.0–100.0)
Platelets: 174 10*3/uL (ref 150–400)
RBC: 4.05 MIL/uL (ref 3.87–5.11)
RDW: 13.3 % (ref 11.5–15.5)
WBC: 9 10*3/uL (ref 4.0–10.5)
nRBC: 0 % (ref 0.0–0.2)

## 2022-02-11 LAB — ECHOCARDIOGRAM LIMITED
AR max vel: 2.83 cm2
AV Area VTI: 3.11 cm2
AV Area mean vel: 3.39 cm2
AV Mean grad: 11 mmHg
AV Peak grad: 22.1 mmHg
Ao pk vel: 2.35 m/s
Area-P 1/2: 2.31 cm2
P 1/2 time: 126.8 msec
Weight: 3036.8 oz

## 2022-02-11 LAB — BASIC METABOLIC PANEL
Anion gap: 11 (ref 5–15)
BUN: 21 mg/dL (ref 8–23)
CO2: 24 mmol/L (ref 22–32)
Calcium: 8.7 mg/dL — ABNORMAL LOW (ref 8.9–10.3)
Chloride: 104 mmol/L (ref 98–111)
Creatinine, Ser: 1.18 mg/dL — ABNORMAL HIGH (ref 0.44–1.00)
GFR, Estimated: 46 mL/min — ABNORMAL LOW (ref >60)
Glucose, Bld: 113 mg/dL — ABNORMAL HIGH (ref 70–99)
Potassium: 3.7 mmol/L (ref 3.5–5.1)
Sodium: 139 mmol/L (ref 135–145)

## 2022-02-11 LAB — MAGNESIUM: Magnesium: 1.9 mg/dL (ref 1.7–2.4)

## 2022-02-11 MED ORDER — HYDRALAZINE HCL 20 MG/ML IJ SOLN
10.0000 mg | Freq: Four times a day (QID) | INTRAMUSCULAR | Status: DC | PRN
  Administered 2022-02-11: 10 mg via INTRAVENOUS
  Filled 2022-02-11: qty 1

## 2022-02-11 NOTE — Progress Notes (Signed)
CARDIAC REHAB PHASE I   PRE:  Rate/Rhythm: 63 SR   BP:  Sitting: 129/46      SaO2: 99 RA   MODE:  Ambulation: 125 ft   POST:  Rate/Rhythm: 83 SR   BP:  Sitting: 144/57      SaO2: 96 RA   Pt ambulated in hall using front wheel walker. Minimal assist to stand using walker to feel more balanced. Back to room to chair for breakfast with call bell and bedside table in reach. Post op teaching including site care, heart healthy diet, restrictions, exercise guidelines,risk factors, and CRP2 reviewed with pt and daughter. All questions and concerns addressed. Will refer to [place] for CRP2. Will continue to follow. Discharge home later today if she continues to do well with meals and ambulation.   [id number]  Woodroe Chen, RN BSN 02/11/2022 8:56 AM

## 2022-02-11 NOTE — Progress Notes (Signed)
Patient's BP was elevated, MD on call notified, new orders received and administered. BP rechecked after one hour, and is WDL.

## 2022-02-11 NOTE — Progress Notes (Signed)
Echocardiogram 2D Echocardiogram has been performed.  Warren Lacy Gumecindo Hopkin RDCS 02/11/2022, 10:28 AM

## 2022-02-11 NOTE — Progress Notes (Addendum)
Mobility Specialist Progress Note:   02/11/22 0943  Mobility  Activity Ambulated with assistance in hallway  Level of Assistance Minimal assist, patient does 75% or more  Assistive Device Front wheel walker  Distance Ambulated (ft) 250 ft  Activity Response Tolerated well  $Mobility charge 1 Mobility   Pt received in chair willing to participate in mobility. No complaints of pain. MinA to stand from chair. Left in bed with call bell in reach and all needs met.   [facility] Jelesa Mangini Mobility Specialist

## 2022-02-11 NOTE — TOC Transition Note (Signed)
Transition of Care (TOC) - CM/SW Discharge Note Donn Pierini RN, BSN Transitions of Care Unit 4E- RN Case Manager See Treatment Team for direct phone #    Patient Details  Name: Rebekah Pope MRN: [id number] Date of Birth: June 20, 1940  Transition of Care [facility]) CM/SW Contact:  Darrold Span, RN Phone Number: 02/11/2022, 11:10 AM   Clinical Narrative:    Pt stable for transition home today s/p TAVR. CM notified by Cardiac rehab that pt needs RW for home. DME order placed and in house provider called for DME-Rolling walker.  RW to be delivered to room prior to discharge.   No further TOC needs noted. Pt to return home w/ family.    Final next level of care: Home/Self Care Barriers to Discharge: No Barriers Identified   Patient Goals and CMS Choice Patient states their goals for this hospitalization and ongoing recovery are:: return home      Discharge Placement               Home        Discharge Plan and Services   Discharge Planning Services: CM Consult            DME Arranged: Walker rolling DME Agency: [employer] Date DME Agency Contacted: 02/11/22 Time DME Agency Contacted: 1109 Representative spoke with at DME Agency: Arnold Long HH Arranged: NA HH Agency: NA        Social Determinants of Health (SDOH) Interventions     Readmission Risk Interventions    02/11/2022   11:09 AM  Readmission Risk Prevention Plan  Post Dischage Appt Complete  Medication Screening Complete  Transportation Screening Complete

## 2022-02-11 NOTE — Progress Notes (Signed)
1 Day Post-Op Procedure(s) (LRB): Transcatheter Aortic Valve Replacement, Transfemoral (N/A) INTRAOPERATIVE TRANSTHORACIC ECHOCARDIOGRAM (N/A) Subjective:  Had some N/V last pm but feels better this am. Ready for breakfast. Daughter stayed with her.   BP up to 187/65 overnight. Still on IV NTG. 129/46 this am. Rhythm is sinus 63. Old LBBB.   Objective: Vital signs in last 24 hours: Temp:  [97 F (36.1 C)-98.5 F (36.9 C)] 98.3 F (36.8 C) (09/06 0748) Pulse Rate:  [40-66] 63 (09/06 0748) Cardiac Rhythm: Sinus bradycardia;Bundle branch block (09/05 1941) Resp:  [0-25] 17 (09/06 0748) BP: (101-191)/(39-72) 129/46 (09/06 0748) SpO2:  [92 %-99 %] 96 % (09/06 0748) Weight:  [86.1 kg] 86.1 kg (09/06 0517)  Hemodynamic parameters for last 24 hours:    Intake/Output from previous day: 09/05 0701 - 09/06 0700 In: 1537.5 [I.V.:1437.5; IV Piggyback:100] Out: 450 [Urine:400; Blood:50] Intake/Output this shift: No intake/output data recorded.  General appearance: alert and cooperative Neurologic: intact Heart: regular rate and rhythm, S1, S2 normal, no murmur Lungs: clear to auscultation bilaterally Extremities: extremities normal, atraumatic, no cyanosis or edema Wound: groin sites ok  Lab Results: Recent Labs    02/10/22 1150 02/11/22 0339  WBC  --  9.0  HGB 11.2* 11.8*  HCT 33.0* 35.7*  PLT  --  174   BMET:  Recent Labs    02/10/22 1150 02/11/22 0339  NA 141 139  K 3.7 3.7  CL 106 104  CO2  --  24  GLUCOSE 126* 113*  BUN 27* 21  CREATININE 1.20* 1.18*  CALCIUM  --  8.7*    PT/INR: No results for input(s): "LABPROT", "INR" in the last 72 hours. ABG    Component Value Date/Time   PHART 7.403 10/31/2021 0928   HCO3 23.7 10/31/2021 0928   TCO2 24 02/10/2022 1150   ACIDBASEDEF 1.0 10/31/2021 0928   O2SAT 97 10/31/2021 0928   CBG (last 3)  No results for input(s): "GLUCAP" in the last 72 hours.  Assessment/Plan: S/P Procedure(s) (LRB): Transcatheter  Aortic Valve Replacement, Transfemoral (N/A) INTRAOPERATIVE TRANSTHORACIC ECHOCARDIOGRAM (N/A)  Labile HTN postop but BP better this am. She received Norvasc 10 yesterday afternoon and on Avapro here. Will send home on Norvasc and her previous Diovan. Daughter can check her BP at home.  2D echo pending.  Xarelto to resume today.  Plan home later this am if she walks ok and eats without nausea.   LOS: 1 day    Alleen Borne 02/11/2022

## 2022-02-12 ENCOUNTER — Telehealth: Payer: Self-pay | Admitting: Physician Assistant

## 2022-02-12 NOTE — Telephone Encounter (Signed)
  [facility]   MULTIDISCIPLINARY HEART VALVE TEAM   Patient contacted regarding discharge from [facility] on 9/6  Patient understands to follow up with a structural heart APP on 9/13 at [facility].  Patient understands discharge instructions? yes Patient understands medications and regimen? yes Patient understands to bring all medications to this visit? yes  Cline Crock PA-C  [facility]

## 2022-02-17 NOTE — Progress Notes (Signed)
HEART AND [place]                                     Cardiology Office Note:    Date:  02/19/2022   ID:  Rebekah Pope, DOB [birth year], MRN [id number]  PCP:  Rhea Bleacher, NP  [facility] Cardiologist:  Jenean Lindau, MD  / Dr. Burt Knack & Dr. Cyndia Bent (TAVR) [facility] Electrophysiologist:  None   Referring MD: Rhea Bleacher, NP   [facility] s/p TAVR  History of Present Illness:    Rebekah Pope is a 81 y.o. 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  She was seen by Dr. Geraldo Pitter and underwent a 2D echocardiogram on 10/15/2021 which showed EF 65%, severe AS with mean grad 44 mmHg, peak grad 69.9 mmHg, AVA 0.5 cm2, DVI 0.16, SVI 40, mild to moderate mitral regurgitation with mild mitral stenosis. 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 Medical therapy was recommended. She had elevated right heart pressures and Lasix was increased to 40 mg daily. She reported progressive exertional shortness of breath and fatigue.  She had mild lower extremity swelling which has resolved with Lasix.    She was evaluated by the multidisciplinary valve team and underwent successful TAVR with a 26 mm Evolut FX THV via the TF approach on 02/10/25. Post operative echo showed EF 65%, normally functioning TAVR with a mean gradient of 11 mmHg and no PVL as well as mild MR/MS. She was resumed on home Xarelto. BP was elevated and Norvasc $RemoveBef'10mg'gGRIOoGrEK$  daily was added.   Today the patient presents to clinic for follow up. No CP or SOB. No LE edema, orthopnea or PND. She has had several episodes of dizziness but no syncope. One episode was while sitting outside in the heat. The other was in the setting of bending over. Both were associated with nausea. No blood in stool or urine. No palpitations.     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 Burt Knack & Dr. Cyndia Bent   Severe aortic stenosis     Past Surgical History:  Procedure Laterality Date   CESAREAN SECTION     INTRAOPERATIVE TRANSTHORACIC ECHOCARDIOGRAM N/A 02/10/2022   Procedure: INTRAOPERATIVE TRANSTHORACIC ECHOCARDIOGRAM;  Surgeon: Sherren Mocha, MD;  Location: [place] [facility];  Service: Open Heart Surgery;  Laterality: N/A;   RIGHT HEART CATH AND CORONARY ANGIOGRAPHY N/A 10/31/2021   Procedure: RIGHT HEART CATH AND CORONARY ANGIOGRAPHY;  Surgeon: Burnell Blanks, MD;  Location: [place] [facility];  Service: Cardiovascular;  Laterality: N/A;   TRANSCATHETER AORTIC VALVE REPLACEMENT, TRANSFEMORAL N/A 02/10/2022   Procedure: Transcatheter Aortic Valve Replacement, Transfemoral;  Surgeon: Sherren Mocha, MD;  Location: [place] [facility];  Service: Open  Heart Surgery;  Laterality: N/A;    Current Medications: Current Meds  Medication Sig   amiodarone (PACERONE) 200 MG tablet Take 100 mg by mouth daily.   levothyroxine (SYNTHROID) 112 MCG tablet Take 112 mcg by mouth daily before breakfast.   Rivaroxaban (XARELTO) 15 MG TABS tablet Take 1 tablet (15 mg total) by mouth daily.   valsartan (DIOVAN) 40 MG tablet Take 40 mg by mouth daily.   [DISCONTINUED] furosemide (LASIX) 40 MG tablet Take 1 tablet (40 mg total) by mouth daily.   [DISCONTINUED] KLOR-CON M10 10 MEQ tablet Take 10 mEq by mouth daily.   [DISCONTINUED] rivaroxaban (XARELTO) 20 MG TABS tablet Take 20 mg by mouth daily with supper.     Allergies:    Patient has no known allergies.   Social History   Socioeconomic History   Marital status: Married    Spouse name: Richard   Number of children: 4   Years of education: Not on file   Highest education level: Not on file  Occupational History   Occupation: Midwife  Tobacco Use   Smoking status: Never    Passive exposure: Past   Smokeless tobacco: Never  Vaping Use   Vaping Use: Never used  Substance and Sexual Activity   Alcohol use: Never   Drug use: Never   Sexual activity: Not on file  Other Topics Concern   Not on file  Social History Narrative   Not on file   Social Determinants of Health   Financial Resource Strain: Not on file  Food Insecurity: Not on file  Transportation Needs: Not on file  Physical Activity: Not on file  Stress: Not on file  Social Connections: Not on file     Family History: The patient's family history includes Dementia in her mother; Heart disease in her father; Hip fracture in her mother; Pneumonia in her mother.  ROS:   Please see the history of present illness.    All other systems reviewed and are negative.  EKGs/Labs/Other Studies Reviewed:    The following studies were reviewed today:  TAVR OPERATIVE NOTE     Date of Procedure:                 02/10/2022   Preoperative Diagnosis:      Severe Aortic Stenosis    Postoperative Diagnosis:    Same    Procedure:        Transcatheter Aortic Valve Replacement - Percutaneous Right Transfemoral Approach             Medtronic Evolut FX  (size 26 mm, serial # [id number])              Co-Surgeons:            Gaye Pollack, MD and Sherren Mocha, MD     Anesthesiologist:                  Maryclare Bean, DO   Echocardiographer:              Bertrum Sol, MD   Pre-operative Echo Findings: Severe aortic stenosis  Normal left ventricular systolic function   Post-operative Echo Findings: Trivial paravalvular leak Normal left ventricular systolic function    _____________     Echo 02/11/22:  IMPRESSIONS   1. Left ventricular ejection fraction, by estimation, is 65 to 70%. The  left ventricle has normal function. The left ventricle has no regional  wall motion abnormalities. There is moderate concentric left ventricular  hypertrophy.   2. Right ventricular systolic function is normal. The right ventricular  size is normal. Tricuspid regurgitation signal is inadequate for assessing  PA pressure.   3. Left atrial size was moderately dilated.   4. Right atrial size was moderately dilated.   5. Mild mitral valve regurgitation. Mild mitral stenosis. The mean mitral valve gradient is 4.0 mmHg with average heart rate of 52 bpm. Severe mitral annular calcification.   6. The aortic valve has been repaired/replaced. Aortic valve  regurgitation is not visualized. There is a 26 mm Medtronic  CoreValve-Evolut Pro prosthetic (TAVR) valve present in the aortic  position. Procedure Date: 02/10/22. Echo findings are consistent  with normal structure and function of the aortic valve prosthesis. Aortic  valve mean gradient measures 11.0 mmHg. Aortic valve Vmax measures 2.35  m/s. Aortic valve acceleration time measures 95 msec.   EKG:  EKG is ordered today.  The ekg ordered today demonstrates sinus brady with old LBBB, HR 47  Recent Labs: 02/06/2022: ALT 12 02/11/2022: BUN 21; Creatinine, Ser 1.18; Hemoglobin 11.8; Magnesium 1.9; Platelets 174; Potassium 3.7; Sodium 139  Recent Lipid Panel No results found for: "CHOL", "TRIG", "HDL", "CHOLHDL", "VLDL", "LDLCALC", "LDLDIRECT"   Risk Assessment/Calculations:    CHA2DS2-VASc Score = 5   This indicates a 7.2% annual risk of stroke. The patient's score is based upon: CHF History: 1 HTN History: 1 Diabetes History: 0 Stroke History: 0 Vascular Disease History: 0 Age Score: 2 Gender Score: 1      Physical Exam:    VS:  BP 114/64   Pulse (!) 47   Ht $R'5\' 3"'Qe$  (1.6 m)   Wt 186 lb (84.4 kg)   SpO2 97%    BMI 32.95 kg/m     Wt Readings from Last 3 Encounters:  02/18/22 186 lb (84.4 kg)  02/11/22 189 lb 12.8 oz (86.1 kg)  02/06/22 187 lb 12.8 oz (85.2 kg)     GEN:  Well nourished, well developed in no acute distress HEENT: Normal NECK: No JVD LYMPHATICS: No lymphadenopathy CARDIAC: RRR, no murmurs, rubs, gallops RESPIRATORY:  Clear to auscultation without rales, wheezing or rhonchi  ABDOMEN: Soft, non-tender, non-distended MUSCULOSKELETAL:  No edema; No deformity  SKIN: Warm and dry.  Groin sites clear without hematoma or ecchymosis  NEUROLOGIC:  Alert and oriented x 3 PSYCHIATRIC:  Normal affect   ASSESSMENT:    1. S/P TAVR (transcatheter aortic valve replacement)   2. Essential (primary) hypertension   3. Paroxysmal atrial fibrillation (HCC)   4. LBBB (left bundle branch block)   5. Chronic diastolic CHF (congestive heart failure) (HCC)   6. Pulmonary nodules   7. Dizziness    PLAN:    In order of problems listed above:  Severe AS s/p TAVR: doing well 1 week out from TAVR. ECG with no HAVB. Groin sites healing well. SBE prophylaxis discussed; the patient is edentulous and does not go to the dentist. Continue on Xarelto. We will see her back next month for follow up and echo   HTN: Bp well controlled today. No changes made.      PAF: continue on amiodarone. Resumed on home Xarelto $RemoveBefo'20mg'PtbEWwsXZQf$  daily after TAVR. Pharmacy calculated creat clearance to be 47 which is right below cut off of 50. She is borderline but qualifies for a dose reduction. Will let her finish the $RemoveBe'20mg'rNmFxFkKa$  tablets she has left and call in next Rx for $Remo'15mg'hMIoE$  daily. This was called in today.    LBBB: she has a  chronic LBBB with also a long history of sinus brady in the high 40s and low 50s per my review of previous ECGs. Given recent Medtronic TAVR and new dizziness, I have placed a Zio AT to rule out HAVB.    Chronic diastolic CHF: appears euvolemic. Continue on home lasix at current dosing.    Pulmonary nodules: pre  TAVR CT showed "small solid pulmonary nodules, largest measures 4 mm in the right middle lobe. No follow-up needed if patient is low-risk (and has no known or suspected primary neoplasm). Non-contrast chest CT can be considered in 12 months if patient is high-risk". She is a lifetime non smoker and low risk. No further work up recommeded.   Dizziness: see above. Sounds more vagal but given underlying conduction disease and recent TAVR, Zio AT placed         Medication Adjustments/Labs and Tests Ordered: Current medicines are reviewed at length with the patient today.  Concerns regarding medicines are outlined above.  Orders Placed This Encounter  Procedures   LONG TERM MONITOR-LIVE TELEMETRY (3-14 DAYS)   EKG 12-Lead   Meds ordered this encounter  Medications   furosemide (LASIX) 40 MG tablet    Sig: Take 1 tablet (40 mg total) by mouth daily.    Dispense:  90 tablet    Refill:  3   KLOR-CON M10 10 MEQ tablet    Sig: Take 1 tablet (10 mEq total) by mouth daily.    Dispense:  90 tablet    Refill:  3   Rivaroxaban (XARELTO) 15 MG TABS tablet    Sig: Take 1 tablet (15 mg total) by mouth daily.    Dispense:  90 tablet    Refill:  3    Patient Instructions  Medication Instructions:  Decrease Xarelto to 15 mg daily   *If you need a refill on your cardiac medications before your next appointment, please call your pharmacy*   Lab Work: None ordered   If you have labs (blood work) drawn today and your tests are completely normal, you will receive your results only by: [place] (if you have MyChart) OR A paper copy in the mail If you have any lab test that is abnormal or we need to change your treatment, we will call you to review the results.   Testing/Procedures: A zio monitor was ordered today. It will remain on for 14 days. You will then return monitor and event diary in provided box. It takes 1-2 weeks for report to be downloaded and returned to [name]. We will call you  with the results. If monitor falls off or has orange flashing light, please call Zio for further instructions.    Follow-Up: Follow up as scheduled   Other Instructions ZIO XT- Long Term Monitor Instructions ZIO AT Long term monitor-Live Telemetry  Your physician has requested you wear a ZIO patch monitor for 14 days.  This is a single patch monitor. [employer] supplies one patch monitor per enrollment. Additional  stickers are not available.  Please do not apply patch if you will be having a Nuclear Stress Test, Echocardiogram, Cardiac CT, MRI,  or Chest Xray during the period you would be wearing the monitor. The patch cannot be worn during  these tests. You cannot remove and re-apply the ZIO AT patch monitor.  Your ZIO patch monitor will be mailed 3 day USPS to your address on file. It may take 3-5 days to  receive your monitor after you have been enrolled.  Once you have received your monitor, please review the enclosed instructions. Your monitor has  already been registered assigning a specific monitor serial # to you.   Billing and Patient Assistance Program information  Theodore Demark has been supplied with any insurance information on record for billing. [employer] offers a sliding scale Patient Assistance Program for patients without insurance, or whose  insurance does not completely cover the cost of the ZIO patch monitor. You must apply for the  Patient Assistance Program to qualify for the discounted rate. To apply, call [employer] at [phone],  select option 4, select option 2 , ask to apply for the Patient Assistance Program, (you can request an  interpreter if needed). [employer] will ask your household income and how many people are in your  household. [employer] will quote your out-of-pocket cost based on this information. They will also be able  to set up a 12 month interest free payment plan if needed.  Applying the monitor   Shave hair from upper left chest.  Hold the abrader  disc by orange tab. Rub the abrader in 40 strokes over left upper chest as indicated in  your monitor instructions.  Clean area with 4 enclosed alcohol pads. Use all pads to ensure the area is cleaned thoroughly. Let  dry.  Apply patch as indicated in monitor instructions. Patch will be placed under collarbone on left side of  chest with arrow pointing upward.  Rub patch adhesive wings for 2 minutes. Remove the white label marked "1". Remove the white label  marked "2". Rub patch adhesive wings for 2 additional minutes.  While looking in a mirror, press and release button in center of patch. A small green light will flash 3-4  times. This will be your only indicator that the monitor has been turned on.  Do not shower for the first 24 hours. You may shower after the first 24 hours.  Press the button if you feel a symptom. You will hear a small click. Record Date, Time and Symptom in  the Patient Log.   Starting the Gateway  In your kit there is a Hydrographic surveyor box the size of a cellphone. This is Airline pilot. It transmits all your  recorded data to [facility]. This box must always stay within 10 feet of you. Open the box and push the *  button. There will be a light that blinks orange and then green a few times. When the light stops  blinking, the Gateway is connected to the ZIO patch. Call [employer] at [phone] to confirm your monitor is transmitting.  Returning your monitor  Remove your patch and place it inside the [place]. In the lower half of the Gateway there is a white  bag with prepaid postage on it. Place Gateway in bag and seal. Mail package back to [place] as soon as  possible. Your physician should have your final report approximately 7 days after you have mailed back  your monitor. Call [place] at [phone] if you have questions regarding your ZIO AT  patch monitor. Call them immediately if you see an orange light blinking on your monitor.  If  your monitor falls off in less than 4 days, contact our Monitor department at [phone]. If your  monitor becomes loose or falls off after 4 days call [employer] at [phone] for suggestions on  securing your monitor          Signed, Angelena Form, Hershal Coria  02/19/2022 9:39 AM  [facility]

## 2022-02-18 ENCOUNTER — Ambulatory Visit: Payer: Medicare PPO | Attending: Physician Assistant | Admitting: Physician Assistant

## 2022-02-18 ENCOUNTER — Telehealth: Payer: Self-pay | Admitting: Cardiovascular Disease

## 2022-02-18 ENCOUNTER — Other Ambulatory Visit (INDEPENDENT_AMBULATORY_CARE_PROVIDER_SITE_OTHER): Payer: Medicare PPO

## 2022-02-18 ENCOUNTER — Telehealth (HOSPITAL_COMMUNITY): Payer: Self-pay | Admitting: *Deleted

## 2022-02-18 VITALS — BP 114/64 | HR 47 | Ht 63.0 in | Wt 186.0 lb

## 2022-02-18 DIAGNOSIS — I480 Paroxysmal atrial fibrillation: Secondary | ICD-10-CM

## 2022-02-18 DIAGNOSIS — Z952 Presence of prosthetic heart valve: Secondary | ICD-10-CM | POA: Diagnosis not present

## 2022-02-18 DIAGNOSIS — I447 Left bundle-branch block, unspecified: Secondary | ICD-10-CM

## 2022-02-18 DIAGNOSIS — R42 Dizziness and giddiness: Secondary | ICD-10-CM | POA: Diagnosis not present

## 2022-02-18 DIAGNOSIS — I10 Essential (primary) hypertension: Secondary | ICD-10-CM | POA: Diagnosis not present

## 2022-02-18 DIAGNOSIS — R918 Other nonspecific abnormal finding of lung field: Secondary | ICD-10-CM

## 2022-02-18 DIAGNOSIS — I5032 Chronic diastolic (congestive) heart failure: Secondary | ICD-10-CM

## 2022-02-18 MED ORDER — FUROSEMIDE 40 MG PO TABS: 40.0000 mg | ORAL_TABLET | Freq: Every day | ORAL | 3 refills | Status: DC

## 2022-02-18 MED ORDER — KLOR-CON M10 10 MEQ PO TBCR: 10.0000 meq | EXTENDED_RELEASE_TABLET | Freq: Every day | ORAL | 3 refills | Status: DC

## 2022-02-18 MED ORDER — RIVAROXABAN 15 MG PO TABS: 15.0000 mg | ORAL_TABLET | Freq: Every day | ORAL | 3 refills | Status: DC

## 2022-02-18 NOTE — Patient Instructions (Addendum)
Medication Instructions:  Decrease Xarelto to 15 mg daily   *If you need a refill on your cardiac medications before your next appointment, please call your pharmacy*   Lab Work: None ordered   If you have labs (blood work) drawn today and your tests are completely normal, you will receive your results only by: [place] (if you have MyChart) OR A paper copy in the mail If you have any lab test that is abnormal or we need to change your treatment, we will call you to review the results.   Testing/Procedures: A zio monitor was ordered today. It will remain on for 14 days. You will then return monitor and event diary in provided box. It takes 1-2 weeks for report to be downloaded and returned to [name]. We will call you with the results. If monitor falls off or has orange flashing light, please call Zio for further instructions.    Follow-Up: Follow up as scheduled   Other Instructions ZIO XT- Long Term Monitor Instructions ZIO AT Long term monitor-Live Telemetry  Your physician has requested you wear a ZIO patch monitor for 14 days.  This is a single patch monitor. [employer] supplies one patch monitor per enrollment. Additional  stickers are not available.  Please do not apply patch if you will be having a Nuclear Stress Test, Echocardiogram, Cardiac CT, MRI,  or Chest Xray during the period you would be wearing the monitor. The patch cannot be worn during  these tests. You cannot remove and re-apply the ZIO AT patch monitor.  Your ZIO patch monitor will be mailed 3 day USPS to your address on file. It may take 3-5 days to  receive your monitor after you have been enrolled.  Once you have received your monitor, please review the enclosed instructions. Your monitor has  already been registered assigning a specific monitor serial # to you.   Billing and Patient Assistance Program information  Theodore Demark has been supplied with any insurance information on record for billing. [employer]  offers a sliding scale Patient Assistance Program for patients without insurance, or whose  insurance does not completely cover the cost of the ZIO patch monitor. You must apply for the  Patient Assistance Program to qualify for the discounted rate. To apply, call [employer] at [phone],  select option 4, select option 2 , ask to apply for the Patient Assistance Program, (you can request an  interpreter if needed). [employer] will ask your household income and how many people are in your  household. [employer] will quote your out-of-pocket cost based on this information. They will also be able  to set up a 12 month interest free payment plan if needed.  Applying the monitor   Shave hair from upper left chest.  Hold the abrader disc by orange tab. Rub the abrader in 40 strokes over left upper chest as indicated in  your monitor instructions.  Clean area with 4 enclosed alcohol pads. Use all pads to ensure the area is cleaned thoroughly. Let  dry.  Apply patch as indicated in monitor instructions. Patch will be placed under collarbone on left side of  chest with arrow pointing upward.  Rub patch adhesive wings for 2 minutes. Remove the white label marked "1". Remove the white label  marked "2". Rub patch adhesive wings for 2 additional minutes.  While looking in a mirror, press and release button in center of patch. A small green light will flash 3-4  times. This will be your only indicator  that the monitor has been turned on.  Do not shower for the first 24 hours. You may shower after the first 24 hours.  Press the button if you feel a symptom. You will hear a small click. Record Date, Time and Symptom in  the Patient Log.   Starting the Gateway  In your kit there is a Hydrographic surveyor box the size of a cellphone. This is Airline pilot. It transmits all your  recorded data to [facility]. This box must always stay within 10 feet of you. Open the box and push the *  button. There will be a light that  blinks orange and then green a few times. When the light stops  blinking, the Gateway is connected to the ZIO patch. Call [employer] at [phone] to confirm your monitor is transmitting.  Returning your monitor  Remove your patch and place it inside the [place]. In the lower half of the Gateway there is a white  bag with prepaid postage on it. Place Gateway in bag and seal. Mail package back to [place] as soon as  possible. Your physician should have your final report approximately 7 days after you have mailed back  your monitor. Call [place] at [phone] if you have questions regarding your ZIO AT  patch monitor. Call them immediately if you see an orange light blinking on your monitor.  If your monitor falls off in less than 4 days, contact our Monitor department at [phone]. If your  monitor becomes loose or falls off after 4 days call [employer] at [phone] for suggestions on  securing your monitor

## 2022-02-18 NOTE — Telephone Encounter (Signed)
Pt's daughter states that she just spoke with PA Cline Crock and wanted to make her aware that pt's BP has been running high. Please advise

## 2022-02-18 NOTE — Telephone Encounter (Signed)
Referral for Cardiac Rehab phase II sent to [facility] per pt request. Jaleia Hanke RN, BSN Cardiac and Pulmonary Rehab Nurse Navigator   

## 2022-02-18 NOTE — Progress Notes (Unsigned)
ZIO AT serial # [id number] from office inventory applied by Cline Crock, PA-C.  Dr. Tomie China to read.

## 2022-02-18 NOTE — Telephone Encounter (Signed)
Daughter is calling with questions concerning patient. Calling to see if she need echo done. Please advise

## 2022-02-19 NOTE — Telephone Encounter (Signed)
Patients BP was perfect in the office yesterday. She can keep a log at home for our next visit.

## 2022-02-23 NOTE — Telephone Encounter (Signed)
Follow up call to patient's daughter [name]. Advised Rita per Angelena Form, PA  to have patient keep a log of blood pressures and bring to next office visit and call us if they start to be more elevated. Daughter verbalized understanding.

## 2022-03-10 NOTE — Progress Notes (Unsigned)
[facility] [place]                                     Cardiology Office Note:    Date:  03/12/2022   ID:  Rebekah Pope, DOB 03/22/1941, MRN [id number]  PCP:  Rhea Bleacher, NP  [facility] Cardiologist:  Jenean Lindau, MD/ Dr.Cooper, MD & Dr. Cyndia Bent, MD (TAVR) [place] Electrophysiologist:  None   Referring MD: Rhea Bleacher, NP   Chief Complaint  Patient presents with   Follow-up    1 month s/p TAVR   History of Present Illness:    Rebekah Pope is a 81 y.o. 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   [name] was seen by Dr. Geraldo Pitter and underwent an echocardiogram on 10/15/2021 which showed EF 65%, severe AS with mean grad 44 mmHg, peak grad 69.9 mmHg, AVA 0.5 cm2, DVI 0.16, SVI 40, mild to moderate mitral regurgitation with mild mitral stenosis. 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 Medical therapy was recommended. She had elevated right heart pressures and Lasix was increased to 40 mg daily. She reported progressive exertional shortness of breath and fatigue.  She had mild lower extremity swelling which has resolved with Lasix.    She was then referred and evaluated by the multidisciplinary valve team and felt to be a good candidate for aortic valve replacement. She underwent successful TAVR with a 26 mm Evolut FX THV via the TF approach on 02/10/25. Post operative echo showed EF 65%, normally functioning TAVR with a mean gradient of 11 mmHg and no PVL as well as mild MR/MS. She was resumed on home Xarelto. BP was elevated and Norvasc 10mg  daily was added.    She was seen in [facility] follow up at which time she reported several episodes of dizziness however no syncope. One episode was while sitting outside in the heat. The other was in the setting of  bending over. Both were associated with nausea. ZIO monitor was placed. This showed 11 episodes of SVT with longest run at 4 beats. Minimum HR at 33bpm with an average at 45bpm. There was no VT, high grade blocks, or pauses.   Today she is here with her husband and reports that the dizziness is somewhat improved however her biggest complaint continues to be profound fatigue. She states that prior to her procedure, she was able to complete tasks within her home and not feel completely wiped out. Now she feels that she needs to rest/nap in between activities. Her HR remains low although bradycardia is not new for her. ZIO monitor results as above. She is on no AV blocking agents. Only on Amiodarone however has been on this for a long time. Echocardiogram today shows normal LVEF at 60-65% with mild MR, mild MS, stable AV valve placement with Medtronic 41mm Evolut Pro with an AVA by VTI at 2.27cm2 and mean gradient at 43mmHg. Otherwise she has no chest pain, LE edema, SOB, orthopnea, bleeding in stool or urine, or syncope.   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 Burt Knack & Dr. Cyndia Bent   Severe aortic stenosis     Past Surgical History:  Procedure Laterality Date   CESAREAN SECTION     INTRAOPERATIVE TRANSTHORACIC ECHOCARDIOGRAM N/A 02/10/2022   Procedure: INTRAOPERATIVE TRANSTHORACIC ECHOCARDIOGRAM;  Surgeon: Sherren Mocha, MD;  Location: [facility];  Service: Open Heart Surgery;  Laterality: N/A;   RIGHT HEART CATH AND CORONARY ANGIOGRAPHY N/A 10/31/2021    Procedure: RIGHT HEART CATH AND CORONARY ANGIOGRAPHY;  Surgeon: Burnell Blanks, MD;  Location: [facility];  Service: Cardiovascular;  Laterality: N/A;   TRANSCATHETER AORTIC VALVE REPLACEMENT, TRANSFEMORAL N/A 02/10/2022   Procedure: Transcatheter Aortic Valve Replacement, Transfemoral;  Surgeon: Sherren Mocha, MD;  Location: [facility];  Service: Open Heart Surgery;  Laterality: N/A;    Current Medications: Current Meds  Medication Sig   amiodarone (PACERONE) 200 MG tablet Take 100 mg by mouth daily.   furosemide (LASIX) 40 MG tablet Take 1 tablet (40 mg total) by mouth daily.   KLOR-CON M10 10 MEQ tablet Take 1 tablet (10 mEq total) by mouth daily.   levothyroxine (SYNTHROID) 112 MCG tablet Take 112 mcg by mouth daily before breakfast.   Rivaroxaban (XARELTO) 15 MG TABS tablet Take 1 tablet (15 mg total) by mouth daily.   valsartan (DIOVAN) 40 MG tablet Take 40 mg by mouth daily.     Allergies:   Patient has no known allergies.   Social History   Socioeconomic History   Marital status: Married    Spouse name: Richard   Number of children: 4   Years of education: Not on file   Highest education level: Not on file  Occupational History   Occupation: Midwife  Tobacco Use   Smoking status: Never    Passive exposure: Past   Smokeless tobacco: Never  Vaping Use   Vaping Use: Never used  Substance and Sexual Activity   Alcohol use: Never   Drug use: Never   Sexual activity: Not on file  Other Topics Concern   Not on file  Social History Narrative   Not on file   Social Determinants of Health   Financial Resource Strain: Not on file  Food Insecurity: Not on file  Transportation Needs: Not on file  Physical Activity: Not on file  Stress: Not on file  Social Connections: Not on file     Family History: The patient's family history includes Dementia in her mother; Heart disease in her father; Hip fracture in her mother; Pneumonia  in her mother.  ROS:   Please see the history of present illness.    All other systems reviewed and are negative.  EKGs/Labs/Other Studies Reviewed:    The following studies were reviewed today:  Echocardiogram 03/11/22:    1. Left ventricular ejection fraction, by estimation, is 60 to 65%. The  left ventricle has normal function. The left ventricle has no regional  wall motion abnormalities. Left ventricular diastolic function could not  be evaluated.   2. Right ventricular systolic function is normal. The right ventricular  size is normal. There is normal pulmonary artery systolic pressure. The  estimated  right ventricular systolic pressure is [id number] mmHg.   3. Left atrial size was mild to moderately dilated.   4. Right atrial size was mildly dilated.   5. The mitral valve is grossly normal. Mild mitral valve regurgitation.  Mild mitral stenosis. The mean mitral valve gradient is 4.0 mmHg with  average heart rate of 46 bpm. Severe mitral annular calcification.   6. The aortic valve has been repaired/replaced. Aortic valve  regurgitation is not visualized. There is a 26 mm Medtronic  CoreValve-Evolut Pro prosthetic (TAVR) valve present in the aortic  position. Procedure Date: 02/10/2022. Echo findings are consistent  with normal structure and function of the aortic valve prosthesis. Aortic  valve area, by VTI measures 2.27 cm. Aortic valve mean gradient measures  7.0 mmHg. Aortic valve Vmax measures 1.88 m/s.   7. The inferior vena cava is dilated in size with >50% respiratory  variability, suggesting right atrial pressure of 8 mmHg.   Comparison(s): No significant change from prior study.    TAVR OPERATIVE NOTE     Date of Procedure:                 02/10/2022   Preoperative Diagnosis:      Severe Aortic Stenosis    Postoperative Diagnosis:    Same    Procedure:        Transcatheter Aortic Valve Replacement - Percutaneous Right Transfemoral Approach             Medtronic  Evolut FX  (size 26 mm, serial # [id number])              Co-Surgeons:            Gaye Pollack, MD and Sherren Mocha, MD     Anesthesiologist:                  Maryclare Bean, DO   Echocardiographer:              Bertrum Sol, MD   Pre-operative Echo Findings: Severe aortic stenosis  Normal left ventricular systolic function   Post-operative Echo Findings: Trivial paravalvular leak Normal left ventricular systolic function   _____________   Echo 02/11/22:  IMPRESSIONS   1. Left ventricular ejection fraction, by estimation, is 65 to 70%. The  left ventricle has normal function. The left ventricle has no regional  wall motion abnormalities. There is moderate concentric left ventricular  hypertrophy.   2. Right ventricular systolic function is normal. The right ventricular  size is normal. Tricuspid regurgitation signal is inadequate for assessing  PA pressure.   3. Left atrial size was moderately dilated.   4. Right atrial size was moderately dilated.   5. Mild mitral valve regurgitation. Mild mitral stenosis. The mean mitral valve gradient is 4.0 mmHg with average heart rate of 52 bpm. Severe mitral annular calcification.   6. The aortic valve has been repaired/replaced. Aortic valve  regurgitation is not visualized. There is a 26 mm Medtronic  CoreValve-Evolut Pro prosthetic (TAVR) valve present in the aortic  position. Procedure Date: 02/10/22. Echo findings are consistent  with normal structure and function of the aortic valve prosthesis. Aortic  valve mean gradient measures 11.0 mmHg. Aortic valve Vmax measures 2.35  m/s. Aortic valve acceleration time measures 95 msec.  EKG:  EKG is not ordered today.    Recent Labs: 02/06/2022: ALT 12 02/11/2022: Magnesium 1.9 03/11/2022: BUN 25; Creatinine, Ser 1.20; Hemoglobin 13.0; Platelets 221; Potassium 4.8; Sodium 139  Recent Lipid Panel  No results found for: "CHOL", "TRIG", "HDL", "CHOLHDL", "VLDL", "LDLCALC", "LDLDIRECT"  Physical  Exam:    VS:  BP (!) 152/78   Pulse (!) 46   Ht 5\' 3"  (1.6 m)   Wt 186 lb (84.4 kg)   SpO2 97%   BMI 32.95 kg/m     Wt Readings from Last 3 Encounters:  03/11/22 186 lb (84.4 kg)  02/18/22 186 lb (84.4 kg)  02/11/22 189 lb 12.8 oz (86.1 kg)    General: Well developed, well nourished, NAD Neck: Negative for carotid bruits. No JVD Lungs:Clear to ausculation bilaterally. No wheezes, rales, or rhonchi. Breathing is unlabored. Cardiovascular: RRR with S1 S2. No murmurs Extremities: No edema. Neuro: Alert and oriented. No focal deficits. No facial asymmetry. MAE spontaneously. Psych: Responds to questions appropriately with normal affect.    ASSESSMENT/PLAN:    Severe AS s/p TAVR: Continues to have fatigue/dizziness with NYHA class II symptoms one month after TAVR. Echocardiogram shows normal LVEF at 60-65% with mild MR, mild MS, stable AV valve placement with Medtronic 10mm Evolut Pro with an AVA by VTI at 2.27cm2 and mean gradient at 87mmHg. She states that prior to her procedure, she was able to complete tasks within her home and not feel completely wiped out. Now she feels that she needs to rest/nap in between activities. Her HR remains low although bradycardia is not new for her. ZIO monitor results as above. She is on no AV blocking agents. Only on Amiodarone however has been on this for a long time. Will discuss case with Dr. Burt Knack for further recommendations. May require EP consultation given underlying conduction disease with persistent bradycardia.  HTN: BP today at 152/78. No changes today.    PAF: No recurrent AF on recent ZIO monitor. Continue on amiodarone and Xarelto 15mg .   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 Minimum HR at 33bpm with an average at 45bpm. There was no VT, high grade blocks,  or pauses. Not currently on AV nodal blocking agents.    Chronic diastolic CHF: Appears euvolemic. Continue on lasix at current dosing.    Pulmonary nodules: Pre TAVR CT showed "small solid pulmonary nodules, largest measures 4 mm in the right middle lobe. No follow-up needed if patient is low-risk (and has no known or suspected primary neoplasm). Non-contrast chest CT can be considered in 12 months if patient is high-risk". She is a lifetime non smoker and low risk. No further work up recommeded.     Medication Adjustments/Labs and Tests Ordered: Current medicines are reviewed at length with the patient today.  Concerns regarding medicines are outlined above.  Orders Placed This Encounter  Procedures   Basic metabolic panel   CBC   No orders of the defined types were placed in this encounter.   Patient Instructions  Medication Instructions:  Your physician recommends that you continue on your current medications as directed. Please refer to the Current Medication list given to you today.  *If you need a refill on your cardiac medications before your next appointment, please call your pharmacy*   Lab Work: TODAY: BMET , CBC If you have labs (blood work) drawn today and your tests are completely normal, you will receive your results only by: [place] (if you have MyChart) OR A paper copy in the mail If  you have any lab test that is abnormal or we need to change your treatment, we will call you to review the results.   Testing/Procedures: NONE   Follow-Up: At [facility], you and your health needs are our priority.  As part of our continuing mission to provide you with exceptional heart care, we have created designated Provider Care Teams.  These Care Teams include your primary Cardiologist (physician) and Advanced Practice Providers (APPs -  Physician Assistants and Nurse Practitioners) who all work together to provide you with the care you need, when you need  it.  We recommend signing up for the patient portal called "MyChart".  Sign up information is provided on this After Visit Summary.  MyChart is used to connect with patients for Virtual Visits (Telemedicine).  Patients are able to view lab/test results, encounter notes, upcoming appointments, etc.  Non-urgent messages can be sent to your provider as well.   To learn more about what you can do with MyChart, go to [email or web address].    Your next appointment:   KEEP SCHEDULED APPOINTMENT   Important Information About Sugar       Signed, Kathyrn Drown, NP  03/12/2022 9:08 AM    [place]

## 2022-03-11 ENCOUNTER — Ambulatory Visit (HOSPITAL_COMMUNITY): Payer: Medicare PPO | Attending: Cardiology | Admitting: Cardiology

## 2022-03-11 ENCOUNTER — Ambulatory Visit (HOSPITAL_BASED_OUTPATIENT_CLINIC_OR_DEPARTMENT_OTHER): Payer: Medicare PPO

## 2022-03-11 VITALS — BP 152/78 | HR 46 | Ht 63.0 in | Wt 186.0 lb

## 2022-03-11 DIAGNOSIS — I10 Essential (primary) hypertension: Secondary | ICD-10-CM | POA: Diagnosis present

## 2022-03-11 DIAGNOSIS — I447 Left bundle-branch block, unspecified: Secondary | ICD-10-CM | POA: Diagnosis present

## 2022-03-11 DIAGNOSIS — I350 Nonrheumatic aortic (valve) stenosis: Secondary | ICD-10-CM | POA: Diagnosis present

## 2022-03-11 DIAGNOSIS — R5383 Other fatigue: Secondary | ICD-10-CM | POA: Diagnosis present

## 2022-03-11 DIAGNOSIS — Z952 Presence of prosthetic heart valve: Secondary | ICD-10-CM | POA: Insufficient documentation

## 2022-03-11 DIAGNOSIS — R42 Dizziness and giddiness: Secondary | ICD-10-CM | POA: Diagnosis present

## 2022-03-11 DIAGNOSIS — R918 Other nonspecific abnormal finding of lung field: Secondary | ICD-10-CM | POA: Diagnosis present

## 2022-03-11 DIAGNOSIS — I480 Paroxysmal atrial fibrillation: Secondary | ICD-10-CM | POA: Insufficient documentation

## 2022-03-11 LAB — ECHOCARDIOGRAM COMPLETE
AR max vel: 2.42 cm2
AV Area VTI: 2.27 cm2
AV Area mean vel: 2.53 cm2
AV Mean grad: 7 mmHg
AV Peak grad: 14.1 mmHg
Ao pk vel: 1.88 m/s
Area-P 1/2: 1.03 cm2
MV M vel: 7.06 m/s
MV Peak grad: 199.4 mmHg
MV VTI: 1.68 cm2
Radius: 0.6 cm
S' Lateral: 3.6 cm

## 2022-03-11 NOTE — Patient Instructions (Addendum)
Medication Instructions:  Your physician recommends that you continue on your current medications as directed. Please refer to the Current Medication list given to you today.  *If you need a refill on your cardiac medications before your next appointment, please call your pharmacy*   Lab Work: TODAY: BMET , CBC If you have labs (blood work) drawn today and your tests are completely normal, you will receive your results only by: [place] (if you have MyChart) OR A paper copy in the mail If you have any lab test that is abnormal or we need to change your treatment, we will call you to review the results.   Testing/Procedures: NONE   Follow-Up: At [facility], you and your health needs are our priority.  As part of our continuing mission to provide you with exceptional heart care, we have created designated Provider Care Teams.  These Care Teams include your primary Cardiologist (physician) and Advanced Practice Providers (APPs -  Physician Assistants and Nurse Practitioners) who all work together to provide you with the care you need, when you need it.  We recommend signing up for the patient portal called "MyChart".  Sign up information is provided on this After Visit Summary.  MyChart is used to connect with patients for Virtual Visits (Telemedicine).  Patients are able to view lab/test results, encounter notes, upcoming appointments, etc.  Non-urgent messages can be sent to your provider as well.   To learn more about what you can do with MyChart, go to [email or web address].    Your next appointment:   KEEP SCHEDULED APPOINTMENT   Important Information About Sugar

## 2022-03-12 LAB — BASIC METABOLIC PANEL
BUN/Creatinine Ratio: 21 (ref 12–28)
BUN: 25 mg/dL (ref 8–27)
CO2: 23 mmol/L (ref 20–29)
Calcium: 9.4 mg/dL (ref 8.7–10.3)
Chloride: 103 mmol/L (ref 96–106)
Creatinine, Ser: 1.2 mg/dL — ABNORMAL HIGH (ref 0.57–1.00)
Glucose: 95 mg/dL (ref 70–99)
Potassium: 4.8 mmol/L (ref 3.5–5.2)
Sodium: 139 mmol/L (ref 134–144)
eGFR: 45 mL/min/{1.73_m2} — ABNORMAL LOW (ref >59)

## 2022-03-12 LAB — CBC
Hematocrit: 39.6 % (ref 34.0–46.6)
Hemoglobin: 13 g/dL (ref 11.1–15.9)
MCH: 29 pg (ref 26.6–33.0)
MCHC: 32.8 g/dL (ref 31.5–35.7)
MCV: 88 fL (ref 79–97)
Platelets: 221 10*3/uL (ref 150–450)
RBC: 4.49 x10E6/uL (ref 3.77–5.28)
RDW: 13.5 % (ref 11.7–15.4)
WBC: 6.9 10*3/uL (ref 3.4–10.8)

## 2022-04-09 ENCOUNTER — Telehealth: Payer: Self-pay

## 2022-04-09 NOTE — Telephone Encounter (Signed)
No vm Dr. Geraldo Pitter would like your home BP/HR readings since it has been elevated in cardiac rehab.

## 2022-04-13 ENCOUNTER — Telehealth: Payer: Self-pay | Admitting: Cardiology

## 2022-04-13 DIAGNOSIS — I161 Hypertensive emergency: Secondary | ICD-10-CM

## 2022-04-13 DIAGNOSIS — Z952 Presence of prosthetic heart valve: Secondary | ICD-10-CM

## 2022-04-13 DIAGNOSIS — I10 Essential (primary) hypertension: Secondary | ICD-10-CM

## 2022-04-13 NOTE — Telephone Encounter (Signed)
New Message:     Patient would like to switch from Dr Geraldo Pitter to Dr Angelena Form. Is this alright with you both?

## 2022-04-13 NOTE — Telephone Encounter (Signed)
No VM or answer on home phone. No VM setup on cell phone.

## 2022-04-14 DIAGNOSIS — I161 Hypertensive emergency: Secondary | ICD-10-CM | POA: Diagnosis not present

## 2022-04-14 DIAGNOSIS — I10 Essential (primary) hypertension: Secondary | ICD-10-CM | POA: Diagnosis not present

## 2022-04-14 DIAGNOSIS — Z952 Presence of prosthetic heart valve: Secondary | ICD-10-CM | POA: Diagnosis not present

## 2022-04-15 NOTE — Telephone Encounter (Signed)
Pt's daughter called back stating she changed her mind and pt would like to keep appt with Dr. Tomie China.

## 2022-04-20 ENCOUNTER — Telehealth: Payer: Self-pay | Admitting: Cardiology

## 2022-04-20 ENCOUNTER — Ambulatory Visit: Payer: Medicare PPO | Attending: Cardiology

## 2022-04-20 ENCOUNTER — Other Ambulatory Visit: Payer: Self-pay

## 2022-04-20 DIAGNOSIS — Z952 Presence of prosthetic heart valve: Secondary | ICD-10-CM

## 2022-04-20 DIAGNOSIS — E039 Hypothyroidism, unspecified: Secondary | ICD-10-CM

## 2022-04-20 DIAGNOSIS — I119 Hypertensive heart disease without heart failure: Secondary | ICD-10-CM

## 2022-04-20 DIAGNOSIS — I480 Paroxysmal atrial fibrillation: Secondary | ICD-10-CM | POA: Diagnosis not present

## 2022-04-20 DIAGNOSIS — R001 Bradycardia, unspecified: Secondary | ICD-10-CM

## 2022-04-20 DIAGNOSIS — Z7901 Long term (current) use of anticoagulants: Secondary | ICD-10-CM | POA: Diagnosis not present

## 2022-04-20 DIAGNOSIS — Z79899 Other long term (current) drug therapy: Secondary | ICD-10-CM | POA: Diagnosis not present

## 2022-04-20 NOTE — Telephone Encounter (Signed)
Pt is requesting provider switch from Dr. Tomie China to Dr. Dulce Sellar due to past acquaintance.

## 2022-04-21 ENCOUNTER — Telehealth: Payer: Self-pay | Admitting: Cardiology

## 2022-04-21 NOTE — Telephone Encounter (Signed)
Spoke with Cyndra Numbers, RN from [facility] who states that the pt's heart rate is 40-50 and she is asymptomatic. Dr. Dulce Sellar advised that home health does not need to call unless heart rate is less than 40. Pt is wearing a Zio AT. Lyla Son verbalized understanding and had no additional questions.

## 2022-04-21 NOTE — Telephone Encounter (Signed)
Caller reports the patient was in the hospital for bradycardia and was released with a Zio monitor.  Caller stated the patient may have medication dosage issues as her pill box showed double doses.  Caller stated patient's HR has been 44-50 range with no other symptoms.

## 2022-04-23 ENCOUNTER — Encounter: Payer: Self-pay | Admitting: Cardiology

## 2022-04-28 ENCOUNTER — Other Ambulatory Visit: Payer: Self-pay

## 2022-04-28 ENCOUNTER — Ambulatory Visit: Payer: Medicare PPO | Attending: Cardiology | Admitting: Cardiology

## 2022-04-28 ENCOUNTER — Encounter: Payer: Self-pay | Admitting: Cardiology

## 2022-04-28 VITALS — BP 123/66 | HR 53 | Ht 63.0 in | Wt 187.0 lb

## 2022-04-28 DIAGNOSIS — I495 Sick sinus syndrome: Secondary | ICD-10-CM | POA: Diagnosis not present

## 2022-04-28 DIAGNOSIS — I447 Left bundle-branch block, unspecified: Secondary | ICD-10-CM | POA: Diagnosis not present

## 2022-04-28 DIAGNOSIS — R001 Bradycardia, unspecified: Secondary | ICD-10-CM | POA: Diagnosis not present

## 2022-04-28 DIAGNOSIS — I480 Paroxysmal atrial fibrillation: Secondary | ICD-10-CM | POA: Diagnosis not present

## 2022-04-28 DIAGNOSIS — I110 Hypertensive heart disease with heart failure: Secondary | ICD-10-CM

## 2022-04-28 DIAGNOSIS — Z79899 Other long term (current) drug therapy: Secondary | ICD-10-CM

## 2022-04-28 DIAGNOSIS — R531 Weakness: Secondary | ICD-10-CM

## 2022-04-28 DIAGNOSIS — I5032 Chronic diastolic (congestive) heart failure: Secondary | ICD-10-CM

## 2022-04-28 DIAGNOSIS — Z952 Presence of prosthetic heart valve: Secondary | ICD-10-CM

## 2022-04-28 NOTE — Progress Notes (Unsigned)
Cardiology Office Note:    Date:  04/28/2022   ID:  Rebekah Pope, DOB 1940/11/03, MRN [id number]  PCP:  Erskine Emery, NP  Cardiologist:  Norman Herrlich, MD    Referring MD: Erskine Emery, NP    ASSESSMENT:    1. Sick sinus syndrome (HCC)   2. Sinus bradycardia   3. Paroxysmal atrial fibrillation (HCC)   4. On amiodarone therapy   5. LBBB (left bundle branch block)   6. S/P TAVR (transcatheter aortic valve replacement)   7. Hypertensive heart disease with chronic diastolic congestive heart failure (HCC)   8. Weakness    PLAN:    In order of problems listed above:  She clearly has sick sinus syndrome PAF low-dose amiodarone to suppress atrial fibrillation and anticoagulated.  Despite TAVR she is profoundly limited in her functional capacity and I think the predominant problem at this time is both resting bradycardia and chronotropic incompetence and I think she would benefit from a permanent pacemaker.  Both the patient and her daughter hope so and looking forward to their EP evaluation.  In the interim we will continue the live monitor she has had no high degree heart block.  I would continue amiodarone to avoid repeated atrial fibrillation. Her hypertension is stable she is not taking loop diuretics.  There may be confusion about her medications we will call and reconcile them a second time with her I think she is taking Entresto as opposed to valsartan.  She has no findings of heart failure after TAVR does not require loop diuretic   Follow-up 3 months  Medication Adjustments/Labs and Tests Ordered: Current medicines are reviewed at length with the patient today.  Concerns regarding medicines are outlined above.  Orders Placed This Encounter  Procedures   EKG 12-Lead   No orders of the defined types were placed in this encounter.  Chief complaint follow-up for fatigue exercise intolerance seen by me in the emergency room 1 week ago discharged home   History of  Present Illness:    Rebekah Pope is a 81 y.o. female with a hx of sick sinus syndrome with marked symptomatic sinus bradycardia left bundle branch block hypertensive heart disease with a previous diagnosis of heart failure hyperlipidemia hypothyroidism paroxysmal atrial fibrillation maintained on low-dose amiodarone and chronic anticoagulation with Xarelto who had TAVR 02/10/2022.  She was last seen by me as an inpatient [facility] health 04/20/2022.  Previous week she is admitted to [facility] for weakness sinus bradycardia and not seen by cardiology and discharged to outpatient care . Following her TAVR she has done very poorly with profound exercise intolerance weakness near syncope and there was a discussion at her TAVR follow-up visit in the heart structure clinic about referral to EP.  She wore a ZIO monitor after her last visit in [place] showing brief runs of atrial premature contractions no episodes of pauses or AV block but characterized by an average heart rate of 45 bpm.  When seen by me in the emergency room I felt that she did not require repeat hospitalization her EKG showed sinus bradycardia 43 bpm left bundle branch block we applied a live ZIO monitor with concerns of symptomatic pulses and made arrangements for her to be seen by EPS I feel she will require pacing for symptomatic severe sinus bradycardia and I am uncomfortable stopping amiodarone. She had laboratory studies performed creatinine was 1.30 GFR 39 cc potassium 4.6 CBC was normal.  Compliance with diet, lifestyle and  medications: Yes  Her daughter participates in the evaluation decision making by phone She has had no further severe episodes of near syncope her home blood pressures are improved from the range of  146/69 to 156/64. In the office today blood pressure supine 165/76 standing 123/66 She still feels very weak fatigued and just cannot do physical activity Reviewed her live ZIO monitor heart rates run  from 37 to 42 bpm. Unfortunately despite TAVR she has not improved functionally I think is because of sick sinus syndrome and both resting and exercise-induced and adequate heart rate chronotropic incompetence and when she wore first monitor she had an average heart rate of 45 bpm. I do think in her case that pacing her to give her an adequate resting and activity her heart rate would improve the quality of her life and she will be seeing EP for evaluation. 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 Excell Seltzer & Dr. Laneta Simmers   Severe aortic stenosis     Past Surgical History:  Procedure Laterality Date   CESAREAN SECTION     INTRAOPERATIVE TRANSTHORACIC ECHOCARDIOGRAM N/A 02/10/2022   Procedure: INTRAOPERATIVE TRANSTHORACIC ECHOCARDIOGRAM;  Surgeon: Tonny Bollman, MD;  Location: [facility] INVASIVE CV LAB;  Service: Open Heart Surgery;  Laterality: N/A;   RIGHT HEART CATH AND CORONARY ANGIOGRAPHY N/A 10/31/2021   Procedure: RIGHT HEART CATH AND CORONARY ANGIOGRAPHY;  Surgeon: Kathleene Hazel, MD;  Location: [facility] INVASIVE CV LAB;  Service: Cardiovascular;  Laterality: N/A;   TRANSCATHETER AORTIC VALVE REPLACEMENT, TRANSFEMORAL N/A 02/10/2022   Procedure: Transcatheter Aortic Valve Replacement, Transfemoral;  Surgeon: Tonny Bollman, MD;  Location[facility] INVASIVE CV LAB;  Service: Open Heart Surgery;  Laterality: N/A;    Current Medications: Current Meds  Medication Sig   levothyroxine (SYNTHROID) 112 MCG tablet Take 112 mcg by mouth daily before breakfast.   Rivaroxaban  (XARELTO) 15 MG TABS tablet Take 1 tablet (15 mg total) by mouth daily.   valsartan (DIOVAN) 40 MG tablet Take 40 mg by mouth daily.   [DISCONTINUED] furosemide (LASIX) 40 MG tablet Take 1 tablet (40 mg total) by mouth daily.   [DISCONTINUED] KLOR-CON M10 10 MEQ tablet Take 1 tablet (10 mEq total) by mouth daily.     Allergies:   Patient has no known allergies.   Social History   Socioeconomic History   Marital status: Married    Spouse name: Richard   Number of children: 4   Years of education: Not on file   Highest education level: Not on file  Occupational History   Occupation: Animator  Tobacco Use   Smoking status: Never    Passive exposure: Past   Smokeless tobacco: Never  Vaping Use   Vaping Use: Never used  Substance and Sexual Activity   Alcohol use: Never   Drug use: Never   Sexual activity: Not on file  Other Topics Concern   Not on file  Social History Narrative   Not on file   Social Determinants of  Health   Financial Resource Strain: Not on file  Food Insecurity: Not on file  Transportation Needs: Not on file  Physical Activity: Not on file  Stress: Not on file  Social Connections: Not on file     Family History: The patient's family history includes Dementia in her mother; Heart disease in her father; Hip fracture in her mother; Pneumonia in her mother. ROS:   Please see the history of present illness.    All other systems reviewed and are negative.  EKGs/Labs/Other Studies Reviewed:    The following studies were reviewed today:    Recent Labs: 02/06/2022: ALT 12 02/11/2022: Magnesium 1.9 03/11/2022: BUN 25; Creatinine, Ser 1.20; Hemoglobin 13.0; Platelets 221; Potassium 4.8; Sodium 139  Recent Lipid Panel No results found for: "CHOL", "TRIG", "HDL", "CHOLHDL", "VLDL", "LDLCALC", "LDLDIRECT"  Physical Exam:    VS:  BP 123/66 (BP Location: Right Arm, Patient Position: Standing)   Pulse (!) 53   Ht 5\' 3"  (1.6 m)   Wt 187 lb  (84.8 kg)   BMI 33.13 kg/m     Wt Readings from Last 3 Encounters:  04/28/22 187 lb (84.8 kg)  03/11/22 186 lb (84.4 kg)  02/18/22 186 lb (84.4 kg)     GEN:  Well nourished, well developed in no acute distress HEENT: Normal NECK: No JVD; No carotid bruits LYMPHATICS: No lymphadenopathy CARDIAC: RRR, no murmurs, rubs, gallops RESPIRATORY:  Clear to auscultation without rales, wheezing or rhonchi  ABDOMEN: Soft, non-tender, non-distended MUSCULOSKELETAL:  No edema; No deformity  SKIN: Warm and dry NEUROLOGIC:  Alert and oriented x 3 PSYCHIATRIC:  Normal affect    Signed, 02/20/22, MD  04/28/2022 3:39 PM    [facility]

## 2022-04-28 NOTE — Patient Instructions (Addendum)
Medication Instructions:  Your physician has recommended you make the following change in your medication:   STOP: Potassium STOP: Lasix  *If you need a refill on your cardiac medications before your next appointment, please call your pharmacy*   Lab Work: None If you have labs (blood work) drawn today and your tests are completely normal, you will receive your results only by: MyChart Message (if you have MyChart) OR A paper copy in the mail If you have any lab test that is abnormal or we need to change your treatment, we will call you to review the results.   Testing/Procedures: None   Follow-Up: At [facility], you and your health needs are our priority.  As part of our continuing mission to provide you with exceptional heart care, we have created designated Provider Care Teams.  These Care Teams include your primary Cardiologist (physician) and Advanced Practice Providers (APPs -  Physician Assistants and Nurse Practitioners) who all work together to provide you with the care you need, when you need it.  We recommend signing up for the patient portal called "MyChart".  Sign up information is provided on this After Visit Summary.  MyChart is used to connect with patients for Virtual Visits (Telemedicine).  Patients are able to view lab/test results, encounter notes, upcoming appointments, etc.  Non-urgent messages can be sent to your provider as well.   To learn more about what you can do with MyChart, go to [email or web address].    Your next appointment:   6 week(s)  The format for your next appointment:   In Person  Provider:   Norman Herrlich, MD    Other Instructions None  Important Information About Sugar

## 2022-04-29 ENCOUNTER — Ambulatory Visit: Payer: Medicare PPO | Admitting: Cardiology

## 2022-05-04 ENCOUNTER — Encounter: Payer: Self-pay | Admitting: Cardiology

## 2022-05-04 ENCOUNTER — Other Ambulatory Visit: Payer: Self-pay

## 2022-05-04 ENCOUNTER — Telehealth: Payer: Self-pay | Admitting: Cardiology

## 2022-05-04 MED ORDER — AMLODIPINE BESYLATE 10 MG PO TABS: 10.0000 mg | ORAL_TABLET | Freq: Every day | ORAL | 3 refills | Status: DC

## 2022-05-04 MED ORDER — ENTRESTO 24-26 MG PO TABS: 1.0000 | ORAL_TABLET | Freq: Two times a day (BID) | ORAL | 3 refills | Status: DC

## 2022-05-04 MED ORDER — AMIODARONE HCL 200 MG PO TABS: 100.0000 mg | ORAL_TABLET | Freq: Every day | ORAL | 3 refills | Status: DC

## 2022-05-04 MED ORDER — HYDRALAZINE HCL 25 MG PO TABS: 25.0000 mg | ORAL_TABLET | ORAL | 1 refills | Status: DC | PRN

## 2022-05-04 MED ORDER — HYDRALAZINE HCL 25 MG PO TABS: 25.0000 mg | ORAL_TABLET | Freq: Three times a day (TID) | ORAL | 3 refills | Status: DC

## 2022-05-04 MED ORDER — RIVAROXABAN 15 MG PO TABS: 15.0000 mg | ORAL_TABLET | Freq: Every day | ORAL | 3 refills | Status: DC

## 2022-05-04 NOTE — Telephone Encounter (Signed)
Pt c/o medication issue:  1. Name of Medication:   hydrALAZINE (APRESOLINE) 25 MG tablet    2. How are you currently taking this medication (dosage and times per day)?   3. Are you having a reaction (difficulty breathing--STAT)?   4. What is your medication issue? Pt is calling to get clarification on this medication. Two different directions were sent in to this pharmacy and they needs to know which instructions to go with. Please advise.

## 2022-05-04 NOTE — Telephone Encounter (Signed)
*  STAT* If patient is at the pharmacy, call can be transferred to refill team.   1. Which medications need to be refilled? (please list name of each medication and dose if known)   amiodarone (PACERONE) 200 MG tablet    2. Which pharmacy/location (including street and city if local pharmacy) is medication to be sent to? [facility] - [place], [place] - [address]   3. Do they need a 30 day or 90 day supply?  30 day    Home Health nurse would also like a callback from nurse regarding new medication that was prescribed to pt while in hospital. Please advise

## 2022-05-04 NOTE — Telephone Encounter (Signed)
Called Rebekah Pope the patient's home health nurse and we reconciled the patients medications to include the medications she was put on after being discharged from the hospital. The patient's medications were also refilled after they were reconciled which includes the patients amiodarone. Rebekah Pope was appreciative for the call and had no further questions at this time.

## 2022-05-05 ENCOUNTER — Other Ambulatory Visit: Payer: Self-pay

## 2022-05-05 ENCOUNTER — Telehealth: Payer: Self-pay | Admitting: *Deleted

## 2022-05-05 NOTE — Telephone Encounter (Signed)
Called [facility] and clarified Hydralazine medication dose.

## 2022-05-05 NOTE — Telephone Encounter (Signed)
Spoke with pt today and asked her to send the zio monitor in and also send back the new one they had sent her due to excessive button pushes. Spoke with [place] at [place] and she said it was time to go ahead and send monitor back and that pt does not need the other one that was just sent to pt. Pt vebalized understanding and will send monitors back.

## 2022-05-10 ENCOUNTER — Emergency Department (HOSPITAL_COMMUNITY): Payer: Medicare PPO

## 2022-05-10 ENCOUNTER — Other Ambulatory Visit: Payer: Self-pay

## 2022-05-10 ENCOUNTER — Inpatient Hospital Stay (HOSPITAL_COMMUNITY)
Admission: EM | Admit: 2022-05-10 | Discharge: 2022-05-12 | DRG: 065 | Disposition: A | Discharge status: Rehabilitation Facility | Payer: Medicare PPO | Attending: Internal Medicine | Admitting: Internal Medicine

## 2022-05-10 DIAGNOSIS — Z950 Presence of cardiac pacemaker: Secondary | ICD-10-CM | POA: Diagnosis not present

## 2022-05-10 DIAGNOSIS — I480 Paroxysmal atrial fibrillation: Secondary | ICD-10-CM | POA: Diagnosis not present

## 2022-05-10 DIAGNOSIS — R2689 Other abnormalities of gait and mobility: Secondary | ICD-10-CM | POA: Diagnosis not present

## 2022-05-10 DIAGNOSIS — I639 Cerebral infarction, unspecified: Secondary | ICD-10-CM

## 2022-05-10 DIAGNOSIS — R001 Bradycardia, unspecified: Principal | ICD-10-CM

## 2022-05-10 DIAGNOSIS — G319 Degenerative disease of nervous system, unspecified: Secondary | ICD-10-CM | POA: Diagnosis present

## 2022-05-10 DIAGNOSIS — R7303 Prediabetes: Secondary | ICD-10-CM | POA: Diagnosis present

## 2022-05-10 DIAGNOSIS — Z7901 Long term (current) use of anticoagulants: Secondary | ICD-10-CM | POA: Diagnosis not present

## 2022-05-10 DIAGNOSIS — E039 Hypothyroidism, unspecified: Secondary | ICD-10-CM | POA: Diagnosis present

## 2022-05-10 DIAGNOSIS — Z79899 Other long term (current) drug therapy: Secondary | ICD-10-CM

## 2022-05-10 DIAGNOSIS — Z953 Presence of xenogenic heart valve: Secondary | ICD-10-CM | POA: Diagnosis not present

## 2022-05-10 DIAGNOSIS — I130 Hypertensive heart and chronic kidney disease with heart failure and stage 1 through stage 4 chronic kidney disease, or unspecified chronic kidney disease: Secondary | ICD-10-CM | POA: Diagnosis not present

## 2022-05-10 DIAGNOSIS — I6389 Other cerebral infarction: Secondary | ICD-10-CM | POA: Diagnosis not present

## 2022-05-10 DIAGNOSIS — H4901 Third [oculomotor] nerve palsy, right eye: Secondary | ICD-10-CM | POA: Diagnosis present

## 2022-05-10 DIAGNOSIS — E785 Hyperlipidemia, unspecified: Secondary | ICD-10-CM | POA: Diagnosis present

## 2022-05-10 DIAGNOSIS — L89156 Pressure-induced deep tissue damage of sacral region: Secondary | ICD-10-CM | POA: Diagnosis not present

## 2022-05-10 DIAGNOSIS — E1122 Type 2 diabetes mellitus with diabetic chronic kidney disease: Secondary | ICD-10-CM | POA: Diagnosis not present

## 2022-05-10 DIAGNOSIS — I5032 Chronic diastolic (congestive) heart failure: Secondary | ICD-10-CM | POA: Diagnosis not present

## 2022-05-10 DIAGNOSIS — J309 Allergic rhinitis, unspecified: Secondary | ICD-10-CM | POA: Diagnosis present

## 2022-05-10 DIAGNOSIS — I6340 Cerebral infarction due to embolism of unspecified cerebral artery: Secondary | ICD-10-CM | POA: Diagnosis present

## 2022-05-10 DIAGNOSIS — R7989 Other specified abnormal findings of blood chemistry: Secondary | ICD-10-CM

## 2022-05-10 DIAGNOSIS — N1832 Chronic kidney disease, stage 3b: Secondary | ICD-10-CM | POA: Diagnosis not present

## 2022-05-10 DIAGNOSIS — N1831 Chronic kidney disease, stage 3a: Secondary | ICD-10-CM | POA: Diagnosis present

## 2022-05-10 DIAGNOSIS — I69398 Other sequelae of cerebral infarction: Secondary | ICD-10-CM | POA: Diagnosis not present

## 2022-05-10 DIAGNOSIS — I495 Sick sinus syndrome: Secondary | ICD-10-CM | POA: Diagnosis not present

## 2022-05-10 DIAGNOSIS — H532 Diplopia: Secondary | ICD-10-CM | POA: Diagnosis not present

## 2022-05-10 DIAGNOSIS — I2489 Other forms of acute ischemic heart disease: Secondary | ICD-10-CM | POA: Diagnosis not present

## 2022-05-10 DIAGNOSIS — I2510 Atherosclerotic heart disease of native coronary artery without angina pectoris: Secondary | ICD-10-CM | POA: Diagnosis present

## 2022-05-10 LAB — LIPID PANEL
Cholesterol: 268 mg/dL — ABNORMAL HIGH (ref 0–200)
HDL: 87 mg/dL (ref >40)
LDL Cholesterol: 176 mg/dL — ABNORMAL HIGH (ref 0–99)
Total CHOL/HDL Ratio: 3.1 RATIO
Triglycerides: 27 mg/dL (ref <150)
VLDL: 5 mg/dL (ref 0–40)

## 2022-05-10 LAB — CBC WITH DIFFERENTIAL/PLATELET
Abs Immature Granulocytes: 0.02 10*3/uL (ref 0.00–0.07)
Basophils Absolute: 0 10*3/uL (ref 0.0–0.1)
Basophils Relative: 1 %
Eosinophils Absolute: 0.1 10*3/uL (ref 0.0–0.5)
Eosinophils Relative: 2 %
HCT: 40.4 % (ref 36.0–46.0)
Hemoglobin: 13.3 g/dL (ref 12.0–15.0)
Immature Granulocytes: 0 %
Lymphocytes Relative: 28 %
Lymphs Abs: 1.7 10*3/uL (ref 0.7–4.0)
MCH: 29.8 pg (ref 26.0–34.0)
MCHC: 32.9 g/dL (ref 30.0–36.0)
MCV: 90.6 fL (ref 80.0–100.0)
Monocytes Absolute: 0.4 10*3/uL (ref 0.1–1.0)
Monocytes Relative: 7 %
Neutro Abs: 3.9 10*3/uL (ref 1.7–7.7)
Neutrophils Relative %: 62 %
Platelets: 189 10*3/uL (ref 150–400)
RBC: 4.46 MIL/uL (ref 3.87–5.11)
RDW: 13.9 % (ref 11.5–15.5)
WBC: 6.3 10*3/uL (ref 4.0–10.5)
nRBC: 0 % (ref 0.0–0.2)

## 2022-05-10 LAB — RAPID URINE DRUG SCREEN, HOSP PERFORMED
Amphetamines: NOT DETECTED
Barbiturates: NOT DETECTED
Benzodiazepines: NOT DETECTED
Cocaine: NOT DETECTED
Opiates: NOT DETECTED
Tetrahydrocannabinol: NOT DETECTED

## 2022-05-10 LAB — MAGNESIUM: Magnesium: 2.2 mg/dL (ref 1.7–2.4)

## 2022-05-10 LAB — URINALYSIS, ROUTINE W REFLEX MICROSCOPIC
Bacteria, UA: NONE SEEN
Bilirubin Urine: NEGATIVE
Glucose, UA: NEGATIVE mg/dL
Hgb urine dipstick: NEGATIVE
Ketones, ur: NEGATIVE mg/dL
Leukocytes,Ua: NEGATIVE
Nitrite: NEGATIVE
Protein, ur: 30 mg/dL — AB
Specific Gravity, Urine: 1.015 (ref 1.005–1.030)
pH: 6 (ref 5.0–8.0)

## 2022-05-10 LAB — COMPREHENSIVE METABOLIC PANEL
ALT: 15 U/L (ref 0–44)
AST: 22 U/L (ref 15–41)
Albumin: 3.9 g/dL (ref 3.5–5.0)
Alkaline Phosphatase: 60 U/L (ref 38–126)
Anion gap: 9 (ref 5–15)
BUN: 32 mg/dL — ABNORMAL HIGH (ref 8–23)
CO2: 23 mmol/L (ref 22–32)
Calcium: 9.1 mg/dL (ref 8.9–10.3)
Chloride: 107 mmol/L (ref 98–111)
Creatinine, Ser: 1.17 mg/dL — ABNORMAL HIGH (ref 0.44–1.00)
GFR, Estimated: 46 mL/min — ABNORMAL LOW (ref >60)
Glucose, Bld: 125 mg/dL — ABNORMAL HIGH (ref 70–99)
Potassium: 4.1 mmol/L (ref 3.5–5.1)
Sodium: 139 mmol/L (ref 135–145)
Total Bilirubin: 0.5 mg/dL (ref 0.3–1.2)
Total Protein: 7 g/dL (ref 6.5–8.1)

## 2022-05-10 LAB — I-STAT CHEM 8, ED
BUN: 31 mg/dL — ABNORMAL HIGH (ref 8–23)
Calcium, Ion: 1.17 mmol/L (ref 1.15–1.40)
Chloride: 106 mmol/L (ref 98–111)
Creatinine, Ser: 1 mg/dL (ref 0.44–1.00)
Glucose, Bld: 120 mg/dL — ABNORMAL HIGH (ref 70–99)
HCT: 37 % (ref 36.0–46.0)
Hemoglobin: 12.6 g/dL (ref 12.0–15.0)
Potassium: 4.2 mmol/L (ref 3.5–5.1)
Sodium: 139 mmol/L (ref 135–145)
TCO2: 25 mmol/L (ref 22–32)

## 2022-05-10 LAB — APTT: aPTT: 30 seconds (ref 24–36)

## 2022-05-10 LAB — ETHANOL: Alcohol, Ethyl (B): <10 mg/dL (ref <10)

## 2022-05-10 LAB — BRAIN NATRIURETIC PEPTIDE: B Natriuretic Peptide: 271.6 pg/mL — ABNORMAL HIGH (ref 0.0–100.0)

## 2022-05-10 LAB — TROPONIN I (HIGH SENSITIVITY)
Troponin I (High Sensitivity): 105 ng/L (ref <18)
Troponin I (High Sensitivity): 114 ng/L (ref <18)

## 2022-05-10 LAB — TSH: TSH: 0.529 u[IU]/mL (ref 0.350–4.500)

## 2022-05-10 LAB — PROTIME-INR
INR: 1.6 — ABNORMAL HIGH (ref 0.8–1.2)
Prothrombin Time: 18.8 seconds — ABNORMAL HIGH (ref 11.4–15.2)

## 2022-05-10 MED ORDER — AMIODARONE HCL 200 MG PO TABS
100.0000 mg | ORAL_TABLET | Freq: Every day | ORAL | Status: DC
  Administered 2022-05-10 – 2022-05-12 (×3): 100 mg via ORAL
  Filled 2022-05-10 (×3): qty 1

## 2022-05-10 MED ORDER — ONDANSETRON HCL 4 MG/2ML IJ SOLN
4.0000 mg | Freq: Once | INTRAMUSCULAR | Status: AC
  Administered 2022-05-10: 4 mg via INTRAVENOUS
  Filled 2022-05-10: qty 2

## 2022-05-10 MED ORDER — ATORVASTATIN CALCIUM 80 MG PO TABS
80.0000 mg | ORAL_TABLET | Freq: Every day | ORAL | Status: DC
  Administered 2022-05-10 – 2022-05-12 (×3): 80 mg via ORAL
  Filled 2022-05-10: qty 2
  Filled 2022-05-10: qty 1
  Filled 2022-05-10: qty 2

## 2022-05-10 MED ORDER — ENOXAPARIN SODIUM 40 MG/0.4ML IJ SOSY: 40.0000 mg | PREFILLED_SYRINGE | Freq: Every day | INTRAMUSCULAR | Status: DC

## 2022-05-10 MED ORDER — RIVAROXABAN 15 MG PO TABS
15.0000 mg | ORAL_TABLET | Freq: Every day | ORAL | Status: DC
  Administered 2022-05-10 – 2022-05-11 (×2): 15 mg via ORAL
  Filled 2022-05-10 (×2): qty 1

## 2022-05-10 MED ORDER — LEVOTHYROXINE SODIUM 112 MCG PO TABS
112.0000 ug | ORAL_TABLET | Freq: Every day | ORAL | Status: DC
  Administered 2022-05-10 – 2022-05-12 (×3): 112 ug via ORAL
  Filled 2022-05-10 (×3): qty 1

## 2022-05-10 MED ORDER — LABETALOL HCL 5 MG/ML IV SOLN: 5.0000 mg | INTRAVENOUS | Status: DC | PRN

## 2022-05-10 NOTE — ED Notes (Signed)
Patient transported to MRI 

## 2022-05-10 NOTE — ED Triage Notes (Addendum)
BIB EMS from home. Sudden onset of nausea, dizziness, and weakness with vomiting.  Stroke screen negative per [facility].  HR was found to be 53.  100% on RA.  PVCs on EMS EKG.  BP 182/69. Appt with cards for potential pacemaker on the 13th of this month.  Had mitral valve replacement 3 weeks ago .  18G LAC

## 2022-05-10 NOTE — ED Notes (Signed)
Pt states she gets "dizzy when she opens both eyes."  She states that it helps when she had one eye closed and one eye open.  This RN noticed that right eye deviates to the right down some when looking straight with both eyes open.

## 2022-05-10 NOTE — Consult Note (Addendum)
Cardiology Consultation:   Patient ID: Rebekah Pope MRN: [id number]; DOB: May 05, 1941  Admit date: 05/10/2022 Date of Consult: 05/10/2022  Primary Care Provider: Erskine Emery, NP [facility] Cardiologist: Garwin Brothers, MD  [facility] Electrophysiologist:  None   Patient Profile:   Rebekah Pope is a 81 y.o. 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 Nicanor Alcon.  History of Present Illness:   Rebekah Pope was brought in by EMS with sudden onset nausea, dizziness, weakness, and emesis.   VS on EMS arrival: P 53, BP 182/69, O2 100%/RA.   VS on ED arrival: P 51, BP 162/58, O2 98%/RA, RR 20.   She reported generalized weakness, profound nausea and binocular diplopia (double vision with eyes open, resolved with either eye closed) on initial evaluation.   Labs reviewed: WBC 6.3, Hb 13.3, plt 189, sCr 1.17 (bl 1.1-1.3), Mg 2.2, BNP 271, hsT 105->114.  MRI brain with scattered tiny acute to subacute infarcts suggesting central embolic disease, brain atrophy and chronic small vessel ischemia with small chronic cerebellar infarcts bilaterally.   CXR with cardiomegaly but otherwise normal.   She had coronary angiography (10/31/21) pre TAVR which showed obstructive small vessel disease however no intervention performed given small caliber vessels with obstructive disease.  VS during my evaluation: P 57, BP 171/62 (92), O2 94%/RA, RR 16.   During my evaluation Rebekah Pope was resting comfortably in bed. 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  She denied any anginal equivalents.  She was recently seen by Dr. Dulce Sellar (04/28/22) who felt that her SSS/pAF resulting in resting  bradycardia and chronotropic incompetence was leading to impaired functional capacity and so she was referred to EP for consideration of PPM placement.  She was continued on amiodarone load for AF management.  She unfortunately had significant exercise intolerance and near syncope following her TAVR and overall she has not done well following TAVR placement.  She did have a Zio patch which showed brief runs of PACs but no pauses or AV block with an average heart rate of 45 bpm.  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 Excell Seltzer & Dr. Laneta Simmers   Severe aortic stenosis   ] Past Surgical History:  Procedure Laterality Date   CESAREAN SECTION     INTRAOPERATIVE TRANSTHORACIC ECHOCARDIOGRAM N/A 02/10/2022   Procedure: INTRAOPERATIVE TRANSTHORACIC ECHOCARDIOGRAM;  Surgeon: Tonny Bollman, MD;  Location: [facility] INVASIVE CV LAB;  Service: Open Heart Surgery;  Laterality: N/A;   RIGHT HEART CATH AND CORONARY ANGIOGRAPHY N/A 10/31/2021   Procedure: RIGHT HEART CATH AND CORONARY ANGIOGRAPHY;  Surgeon: Kathleene Hazel, MD;  Location: [facility] INVASIVE CV LAB;  Service: Cardiovascular;  Laterality: N/A;   TRANSCATHETER AORTIC VALVE REPLACEMENT, TRANSFEMORAL N/A  02/10/2022   Procedure: Transcatheter Aortic Valve Replacement, Transfemoral;  Surgeon: Tonny Bollman, MD;  Location: [facility] INVASIVE CV LAB;  Service: Open Heart Surgery;  Laterality: N/A;    Home Medications:  Prior to Admission medications   Medication Sig Start Date End Date Taking? Authorizing Provider  amiodarone  (PACERONE) 200 MG tablet Take 0.5 tablets (100 mg total) by mouth daily. 05/04/22   Baldo Daub, MD  amLODipine (NORVASC) 10 MG tablet Take 1 tablet (10 mg total) by mouth at bedtime. 05/04/22   Baldo Daub, MD  ENTRESTO 24-26 MG Take 1 tablet by mouth 2 (two) times daily. 05/04/22   Baldo Daub, MD  hydrALAZINE (APRESOLINE) 25 MG tablet Take 1 tablet (25 mg total) by mouth 3 (three) times daily. 05/04/22   Baldo Daub, MD  hydrALAZINE (APRESOLINE) 25 MG tablet Take 1 tablet (25 mg total) by mouth every 4 (four) hours as needed (for systolic blood pressure 170 or greater). 05/04/22   Baldo Daub, MD  levothyroxine (SYNTHROID) 112 MCG tablet Take 112 mcg by mouth daily before breakfast. 09/29/21   [provider]  Rivaroxaban (XARELTO) 15 MG TABS tablet Take 1 tablet (15 mg total) by mouth daily. 05/04/22   Baldo Daub, MD  valsartan (DIOVAN) 40 MG tablet Take 40 mg by mouth daily. 08/26/21   [provider]   Inpatient Medications: Scheduled Meds:  enoxaparin (LOVENOX) injection  40 mg Subcutaneous Daily   Continuous Infusions:  PRN Meds:  Allergies:   No Known Allergies  Social History:   Social History   Socioeconomic History   Marital status: Married    Spouse name: Richard   Number of children: 4   Years of education: Not on file   Highest education level: Not on file  Occupational History   Occupation: Animator  Tobacco Use   Smoking status: Never    Passive exposure: Past   Smokeless tobacco: Never  Vaping Use   Vaping Use: Never used  Substance and Sexual Activity   Alcohol use: Never   Drug use: Never   Sexual activity: Not on file  Other Topics Concern   Not on file  Social History Narrative   Not on file   Social Determinants of Health   Financial Resource Strain: Not on file  Food Insecurity: Not on file  Transportation Needs: Not on file  Physical Activity: Not on file  Stress: Not on file  Social  Connections: Not on file  Intimate Partner Violence: Not on file    Family History:    Family History  Problem Relation Age of Onset   Dementia Mother    Pneumonia Mother    Hip fracture Mother    Heart disease Father     ROS:  Review of Systems: [y] = yes, [ ]  = no      General: Weight gain [ ] ; Weight loss [ ] ; Anorexia [ ] ; Fatigue [ ] ; Fever [ ] ; Chills [ ] ; Weakness [ ]    Cardiac: Chest pain/pressure [ ] ; Resting SOB [ ] ; Exertional SOB [ ] ; Orthopnea [ ] ; Pedal Edema [ ] ; Palpitations [ ] ; Syncope [ ] ; Presyncope [ ] ; Paroxysmal nocturnal dyspnea [ ]    Pulmonary: Cough [ ] ; Wheezing [ ] ; Hemoptysis [ ] ; Sputum [ ] ; Snoring [ ]    GI: Vomiting [ ] ; Dysphagia [ ] ; Melena [ ] ; Hematochezia [ ] ; Heartburn [ ] ; Abdominal pain [ ] ; Constipation [ ] ; Diarrhea [ ] ; BRBPR [ ]   GU: Hematuria [ ] ; Dysuria [ ] ; Nocturia [ ]  Vascular: Pain in legs with walking [ ] ; Pain in feet with lying flat [ ] ; Non-healing sores [ ] ; Stroke [ ] ; TIA [ ] ; Slurred speech [ ] ;   Neuro: Headaches [ ] ; Vertigo [ ] ; Seizures [ ] ; Paresthesias [ ] ;Blurred vision [ ] ; Diplopia [ ] ; Vision changes [y]   Ortho/Skin: Arthritis [ ] ; Joint pain [ ] ; Muscle pain [ ] ; Joint swelling [ ] ; Back Pain [ ] ; Rash [ ]    Psych: Depression [ ] ; Anxiety [ ]    Heme: Bleeding problems [ ] ; Clotting disorders [ ] ; Anemia [ ]    Endocrine: Diabetes [ ] ; Thyroid dysfunction [ ]    Physical Exam/Data:   Vitals:   05/10/22 0035 05/10/22 0230 05/10/22 0300  BP: (!) 162/58 (!) 173/65 (!) 170/74  Pulse: (!) 51 (!) 52 (!) 53  Resp: 20 18 16   Temp: 97.8 F (36.6 C)    SpO2: 98% 96% 96%   No intake or output data in the 24 hours ending 05/10/22 0442    04/28/2022    2:41 PM 03/11/2022   10:26 AM 02/18/2022    2:30 PM  Last 3 Weights  Weight (lbs) 187 lb 186 lb 186 lb  Weight (kg) 84.823 kg 84.369 kg 84.369 kg     There is no height or weight on file to calculate BMI.  General:  Well nourished, well developed, in no acute  distress HEENT: RIGHT eye inferior/laterally directed with PERRL bilaterally, partial ptosis of R eye Vascular: No carotid bruits; FA pulses 2+ bilaterally without bruits  Cardiac:  normal S1, S2; RRR; no murmur  Lungs:  clear to auscultation bilaterally, no wheezing, rhonchi or rales  Abd: soft, nontender, no hepatomegaly  Ext: no edema Musculoskeletal:  No deformities, BUE and BLE strength normal and equal Skin: warm and dry  Neuro: partial CN3 palsy as above Psych:  Normal affect   EKG:  The EKG (05/10/22, 00:27:21) was personally reviewed and demonstrates: SB 56, PR 150, QRS 166, QT/Qtc 542/523, LBBB, isolated PVC   Telemetry:  Telemetry was personally reviewed and demonstrates: NSR 50-60s  Relevant CV Studies:  cCT Result date: 11/11/21 1. Tricuspid aortic valve with severe aortic stenosis. 2. Small annular measurements noted (326 mm2). Would consider 26 mm Evolut Pro. Aortic root and sinus heights favorable for self-expanding valve. Moderate, nearly circumferential calcium noted at the STJ. 3. Small membranous septum (5.2 mm). 4. No significant annular or subannular calcifications. 5. Sufficient coronary to annulus distance. 6. Optimal Fluoroscopic Angle for Delivery: RAO 2 CRA 6 7. Dilated pulmonary artery suggestive of pulmonary hypertension. 8. Severe, caseating mitral annular calcium noted.  RHC/coronary angiography Result date: 10/31/21   Prox RCA lesion is 20% stenosed.   Dist RCA lesion is 20% stenosed.   RPDA lesion is 30% stenosed.   RPAV lesion is 30% stenosed.   Prox Cx lesion is 30% stenosed.   Ramus lesion is 99% stenosed.   Mid LAD lesion is 60% stenosed.   2nd Diag lesion is 70% stenosed.   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 The LAD has moderate mid vessel stenosis. The small diagonal branch has a moderately severe stenosis The Circumflex has mild non-obstructive disease. The small caliber intermediate branch has severe  stenosis but is too small for PCI The RCA is a large dominant artery with mild non-obstructive disease in the proximal and distal vessel. The PDA and posterolateral branches have mild non-obstructive disease.  Severe aortic stenosis by echo. I did not cross the aortic valve today.  Elevated right heart pressures   Laboratory Data:  High Sensitivity Troponin:   Recent Labs  Lab 05/10/22 0043 05/10/22 0236  TROPONINIHS 105* 114*     Chemistry Recent Labs  Lab 05/10/22 0043  NA 139  K 4.1  CL 107  CO2 23  GLUCOSE 125*  BUN 32*  CREATININE 1.17*  CALCIUM 9.1  GFRNONAA 46*  ANIONGAP 9    Recent Labs  Lab 05/10/22 0043  PROT 7.0  ALBUMIN 3.9  AST 22  ALT 15  ALKPHOS 60  BILITOT 0.5   Hematology Recent Labs  Lab 05/10/22 0043  WBC 6.3  RBC 4.46  HGB 13.3  HCT 40.4  MCV 90.6  MCH 29.8  MCHC 32.9  RDW 13.9  PLT 189   BNP Recent Labs  Lab 05/10/22 0043  BNP 271.6*    DDimer No results for input(s): "DDIMER" in the last 168 hours.  Radiology/Studies:  MR BRAIN WO CONTRAST  Result Date: 05/10/2022 CLINICAL DATA:  Syncope with cerebrovascular cause suspected EXAM: MRI HEAD WITHOUT CONTRAST TECHNIQUE: Multiplanar, multiecho pulse sequences of the brain and surrounding structures were obtained without intravenous contrast. COMPARISON:  None Available. FINDINGS: Brain: Tiny foci of restricted diffusion scattered along the bilateral peripheral cerebral hemispheres, cortical to subcortical in location. Patchy involvement in the right occipital lobe and in the posterior right frontal white matter has a weak diffusion signal suggesting more subacute timing. Tiny chronic infarcts in the bilateral cerebral cortex. Chronic lacunar infarcts in the left deep cerebral white matter. Brain atrophy that is generalized. Vascular: Major flow voids are preserved Skull and upper cervical spine: Normal marrow signal. Sinuses/Orbits: Unremarkable. IMPRESSION: 1. Scattered tiny acute to  subacute infarcts suggesting central embolic disease. 2. Brain atrophy and chronic small vessel ischemia with small chronic cerebellar infarcts bilaterally. Electronically Signed   By: Tiburcio PeaJonathan  Watts M.D.   On: 05/10/2022 04:13   DG Chest Portable 1 View  Result Date: 05/10/2022 CLINICAL DATA:  Near syncope EXAM: PORTABLE CHEST 1 VIEW COMPARISON:  04/12/2022 FINDINGS: Cardiomegaly. Aortic atherosclerosis. No confluent opacities, effusions or edema. No acute bony abnormality. IMPRESSION: Cardiomegaly.  No active disease. Electronically Signed   By: Charlett NoseKevin  Dover M.D.   On: 05/10/2022 01:12    Assessment and Plan:   Partial right CN3 palsy  Elevated hsT Acute/subacute embolic CVA Rebekah Pope presented with vertical diplopia and associated nausea that corrected with closing either eye.  She also had a elevated troponin without significant delta.  She has known obstructive coronary disease from coronary angiography (10/31/21) pre TAVR.  She denies any anginal equivalents.  Per discussion with Dr. Nicanor AlconPalumbo the CN3 partial palsy on my exam is new from baseline admission. This is in the setting of acute/subacute embolic CVA. Would discuss further with neuro given findings on MRI. I do not think further workup is warranted for mildly elevated hsT. Also sx not currently related to sx bradycardia since she was in the 60s in NSR during my evaluation. She may have chronotropic incompetence which is being evaluated as an OP but this is not the cause of her current presentation. She is already taking xarelto for stroke risk reduction related to AF and is appropriately dosed (15 mg qhs, GFR 46). She has no significant bleeding history.   For questions or updates, please contact [facility] Please consult [email or web address] for contact info under   Signed, Linton RumpMatthew A Carlisle, MD  05/10/2022  4:42 AM  Personally seen and examined. Agree with above.  No further work-up necessary from a cardiac perspective.   Agree with neurology note reviewed, we will continue with Xarelto.  Multiple embolic phenomenon may also be from recent TAVR on 02/10/2022.  Not uncommon to have subclinical small scattered infarcts.  Currently closing her right eye.  Comfortable, pleasant.  Troponin of 105/114, mildly elevated not ACS.  Sinus bradycardia currently noted on telemetry in the 50s.  I do not think that this is related either to her symptoms.  We will go ahead and sign off.  Please let us know if we can be of further assistance.  Donato Schultz, MD

## 2022-05-10 NOTE — ED Provider Triage Note (Signed)
Emergency Medicine Provider Triage Evaluation Note  Rebekah Pope , a 81 y.o. female  was evaluated in triage.  Pt complains of lightheadedness, vomiting that started shortly before arriving to the emergency room.  She has history of sick sinus syndrome and is undergoing EP eval for potential pacemaker placement.  Baseline heart rate in the 50s.  She denies chest pain, shortness of breath, weakness, difficulty with speech.  Review of Systems  Positive: As above Negative: As above  Physical Exam  BP (!) 162/58 (BP Location: Left Arm)   Pulse (!) 51   Temp 97.8 F (36.6 C)   Resp 20   SpO2 98%  Gen:   Awake, no distress   Resp:  Normal effort  MSK:   Moves extremities without difficulty  Other:  Cranial nerves III through XII intact.  Full range of motion of bilateral upper and lower extremities with 5/5 strength.  Medical Decision Making  Medically screening exam initiated at 12:54 AM.  Appropriate orders placed.  Jalah Warmuth was informed that the remainder of the evaluation will be completed by another provider, this initial triage assessment does not replace that evaluation, and the importance of remaining in the ED until their evaluation is complete.     Marita Kansas, PA-C 05/10/22 [phone]

## 2022-05-10 NOTE — Progress Notes (Signed)
Subjective: Patient admitted this morning, see detailed H&P by Dr Margo Aye 81 year old female with medical history of sick sinus syndrome with baseline heart rate in 40s with consideration of permanent pacemaker, s/p TAVR 02/10/2022, hypertension, LBBB, hyperlipidemia, hypothyroidism, paroxysmal atrial fibrillation on anticoagulation with Xarelto and low-dose amiodarone came to ED from home due to double vision and global weakness. Neurology was consulted, MRI brain ordered to rule out CVA.  Cardiology was consulted for uptrending high-sensitivity troponin. -MRI brain showed scattered tiny acute to subacute infarct suggesting central embolic disease.  Brain atrophy and chronic small vessel ischemia with small chronic cerebellar infarcts bilaterally.  Vitals:   05/10/22 0830 05/10/22 0905  BP: (!) 156/63   Pulse: (!) 54   Resp: 18   Temp:  97.9 F (36.6 C)  SpO2: 96%       A/P  Acute/subacute CVA -MRI brain showed scattered tiny acute to subacute infarct suggesting central embolic disease -Patient was on Xarelto prior to admission -Neurology has been consulted; will follow the recommendations -Likely will require TEE -Permissive hypertension, treat SBP greater than 220 and DBP greater than 120 -Follow fasting lipid profile, hemoglobin A1c  Paroxysmal atrial fibrillation -Continue Xarelto  -Continue amiodarone 100 mg p.o. daily  Hypothyroidism -Continue Synthroid -Check TSH  Sick sinus syndrome -Consideration for permanent pacemaker placement -Cardiology consulted  S/p recent TAVR -Stable     Dmarion Perfect S Hamza Empson [facility]

## 2022-05-10 NOTE — Progress Notes (Signed)
SLP Cancellation Note  Patient Details Name: Eulalah Rupert MRN: [id number] DOB: 20-Jan-1941   Cancelled treatment:       Reason Eval/Treat Not Completed: Other (comment)- Pt passed [facility] swallow screen - d/w RN.  Per protocol, no SLP swallow eval is warranted. Will sign off.  Briceyda Abdullah L. Samson Frederic, MA CCC/SLP Clinical Specialist - Acute Care SLP Acute Rehabilitation Services Office number [phone]    Blenda Mounts Laurice 05/10/2022, 8:25 AM

## 2022-05-10 NOTE — Consult Note (Signed)
NEURO HOSPITALIST CONSULT NOTE   Requestig physician: Dr. Nicanor AlconPalumbo  Reason for Consult: Acute ischemic infarctions on MRI  History obtained from:   Patient and Chart     HPI:                                                                                                                                          Rebekah Pope is an 81 y.o. 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 Vital signs per EMS: HR  53, 100% on RA, PVCs on EMS EKG, BP 182/69. On arrival to the ED she also complained of new onset double vision beginning yesterday. MRI was obtained revealing multiple punctate to small acute ischemic infarctions in separate vascular territories.    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 Excell Seltzerooper & Dr. Laneta SimmersBartle   Severe aortic stenosis     Past Surgical History:  Procedure Laterality Date   CESAREAN SECTION     INTRAOPERATIVE TRANSTHORACIC ECHOCARDIOGRAM N/A 02/10/2022   Procedure: INTRAOPERATIVE TRANSTHORACIC ECHOCARDIOGRAM;  Surgeon: Tonny Bollmanooper, Michael, MD;  [facility];  Service: Open Heart Surgery;  Laterality: N/A;   RIGHT HEART CATH AND CORONARY ANGIOGRAPHY N/A 10/31/2021   Procedure: RIGHT HEART CATH AND CORONARY  ANGIOGRAPHY;  Surgeon: Kathleene HazelMcAlhany, Christopher D, MD;  Location: [facility] INVASIVE CV LAB;  Service: Cardiovascular;  Laterality: N/A;   TRANSCATHETER AORTIC VALVE REPLACEMENT, TRANSFEMORAL N/A 02/10/2022   Procedure: Transcatheter Aortic Valve Replacement, Transfemoral;  Surgeon: Tonny Bollmanooper, Michael, MD;  [facility];  Service: Open Heart Surgery;  Laterality: N/A;    Family History  Problem Relation Age of Onset   Dementia Mother    Pneumonia Mother    Hip fracture Mother    Heart disease Father            Social History:  reports that she has never smoked. She has been exposed to tobacco smoke. She has never used smokeless tobacco. She reports that she does not drink alcohol  and does not use drugs.  No Known Allergies  MEDICATIONS:                                                                                                                     No current facility-administered medications on file prior to encounter.   Current Outpatient Medications on File Prior to Encounter  Medication Sig Dispense Refill   amiodarone (PACERONE) 200 MG tablet Take 0.5 tablets (100 mg total) by mouth daily. 45 tablet 3   amLODipine (NORVASC) 10 MG tablet Take 1 tablet (10 mg total) by mouth at bedtime. 90 tablet 3   ENTRESTO 24-26 MG Take 1 tablet by mouth 2 (two) times daily. 180 tablet 3   hydrALAZINE (APRESOLINE) 25 MG tablet Take 1 tablet (25 mg total) by mouth 3 (three) times daily. 270 tablet 3   hydrALAZINE (APRESOLINE) 25 MG tablet Take 1 tablet (25 mg total) by mouth every 4 (four) hours as needed (for systolic blood pressure 170 or greater). 45 tablet 1   levothyroxine (SYNTHROID) 112 MCG tablet Take 112 mcg by mouth daily before breakfast.     Rivaroxaban (XARELTO) 15 MG TABS tablet Take 1 tablet (15 mg total) by mouth daily. 90 tablet 3   valsartan (DIOVAN) 40 MG tablet Take 40 mg by mouth daily.      Scheduled:  amiodarone  100 mg Oral Daily   atorvastatin  80 mg Oral Daily    levothyroxine  112 mcg Oral QAC breakfast   Rivaroxaban  15 mg Oral QAC supper   Continuous:   ROS:                                                                                                                                       As per HPI. Does not endorse any additional symptoms at the time of bedside neurological evaluation.   Blood pressure (!) 153/65, pulse (!) 54, temperature 97.7 F (36.5 C), temperature source Oral, resp. rate 13, SpO2 96 %.   General Examination:  Physical Exam  HEENT-  [place]/AT   Lungs- Respirations unlabored Extremities- Warm and well perfused   Neurological Examination Mental Status: Awake and alert. Oriented to the city, state, month and year, but states that it is "Monday the 4th". Speech is fluent with intact comprehension and naming. Pleasant and cooperative.  Cranial Nerves: II: Visual fields intact in each eye. PERRL  III,IV, VI: No ptosis, but does squint alternate eyes to overcome her diplopia. Horizontal EOM normal to the left in both eyes. With rightward gaze, left eye stops at the midline and right eye is with nystagmus in far rightward gaze. Diplopia is horizontal and disappears when either eye is closed.  V: Temp sensation equal bilaterally  VII: Decreased NL fold on the right.  VIII: Hearing intact to voice IX,X: No hoarseness XI: Symmetric shoulder shrug XII: Midline tongue extension Motor: BUE 5/5 proximally and distally BLE 5/5 proximally and distally  No pronator drift.  Sensory: Temp and light touch intact throughout, bilaterally. No extinction to DSS.  Deep Tendon Reflexes: 2+ and symmetric bilateral brachioradialis. Trace bilateral patellae.  Cerebellar: No ataxia with FNF bilaterally  Gait: Deferred   Lab Results: Basic Metabolic Panel: Recent Labs  Lab 05/10/22 0043 05/10/22 0612  NA 139 139  K 4.1 4.2  CL 107 106   CO2 23  --   GLUCOSE 125* 120*  BUN 32* 31*  CREATININE 1.17* 1.00  CALCIUM 9.1  --   MG 2.2  --     CBC: Recent Labs  Lab 05/10/22 0043 05/10/22 0612  WBC 6.3  --   NEUTROABS 3.9  --   HGB 13.3 12.6  HCT 40.4 37.0  MCV 90.6  --   PLT 189  --     Cardiac Enzymes: No results for input(s): "CKTOTAL", "CKMB", "CKMBINDEX", "TROPONINI" in the last 168 hours.  Lipid Panel: Recent Labs  Lab 05/10/22 0511  CHOL 268*  TRIG 27  HDL 87  CHOLHDL 3.1  VLDL 5  LDLCALC 176*    Imaging: MR BRAIN WO CONTRAST  Result Date: 05/10/2022 CLINICAL DATA:  Syncope with cerebrovascular cause suspected EXAM: MRI HEAD WITHOUT CONTRAST TECHNIQUE: Multiplanar, multiecho pulse sequences of the brain and surrounding structures were obtained without intravenous contrast. COMPARISON:  None Available. FINDINGS: Brain: Tiny foci of restricted diffusion scattered along the bilateral peripheral cerebral hemispheres, cortical to subcortical in location. Patchy involvement in the right occipital lobe and in the posterior right frontal white matter has a weak diffusion signal suggesting more subacute timing. Tiny chronic infarcts in the bilateral cerebral cortex. Chronic lacunar infarcts in the left deep cerebral white matter. Brain atrophy that is generalized. Vascular: Major flow voids are preserved Skull and upper cervical spine: Normal marrow signal. Sinuses/Orbits: Unremarkable. IMPRESSION: 1. Scattered tiny acute to subacute infarcts suggesting central embolic disease. 2. Brain atrophy and chronic small vessel ischemia with small chronic cerebellar infarcts bilaterally. Electronically Signed   By: Tiburcio Pea M.D.   On: 05/10/2022 04:13   DG Chest Portable 1 View  Result Date: 05/10/2022 CLINICAL DATA:  Near syncope EXAM: PORTABLE CHEST 1 VIEW COMPARISON:  04/12/2022 FINDINGS: Cardiomegaly. Aortic atherosclerosis. No confluent opacities, effusions or edema. No acute bony abnormality. IMPRESSION:  Cardiomegaly.  No active disease. Electronically Signed   By: Charlett Nose M.D.   On: 05/10/2022 01:12     Assessment: 81 y.o. 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 On arrival to the ED she also complained of new onset double vision beginning yesterday. MRI was obtained revealing multiple punctate to small acute ischemic infarctions in separate vascular territories.  - Exam reveals decreased NL fold on the right and left intranuclear ophthalmoplegia (INO).  - MRI brain:  Tiny foci of restricted diffusion scattered along the bilateral peripheral cerebral hemispheres, cortical to subcortical in location. Patchy involvement in the right occipital lobe and in the posterior right frontal white matter has a weak diffusion signal suggesting more subacute timing. Tiny chronic infarcts in the bilateral cerebral cortex. Chronic lacunar infarcts in the left deep cerebral white matter. Generalized brain atrophy and chronic small vessel ischemic changes also noted.  - CXR: Cardiomegaly. No active disease.  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   Recommendations: 1. HgbA1c, fasting lipid panel 2. TTE completed with report pending 3. PT consult, OT consult, Speech consult 4. CTA of head and neck 5. Has been started on a statin 6. Continue Xarelto. Low risk for hemorrhagic conversion of the small infarcts  7. Risk factor modification 8. Telemetry monitoring 9. Frequent neuro checks 10. NPO until passes stroke swallow screen   Electronically signed: Dr. Caryl Pina 05/10/2022, 7:10 AM

## 2022-05-10 NOTE — Evaluation (Signed)
Physical Therapy Evaluation Patient Details Name: Rebekah Pope MRN: [id number] DOB: October 02, 1940 Today's Date: 05/10/2022  History of Present Illness  Rebekah Pope is a 81 y.o. female who presented to [facility] hospital 05/10/2022 with sudden onset nausea, dizziness, weakness, and emesis. 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 PMHx: complex regional pain syndrome, essential HTN, HLD, hypothyroidism, a fib, TVAR.  Clinical Impression  Pt presents to PT with deficits in functional mobility, gait, balance, vision. Pt with dysconjugate gaze, resulting in significant dizziness at rest and when mobilizing with both eyes open. With closure of one eye the pt reports improved vision without dizziness. Pt is able to ambulate for short distance with support of walker, but reports of dizziness and weakness limit her tolerance. PT provides education on gaze stabilization. Pt recommends AIR consult at this time.       Recommendations for follow up therapy are one component of a multi-disciplinary discharge planning process, led by the attending physician.  Recommendations may be updated based on patient status, additional functional criteria and insurance authorization.  Follow Up Recommendations Acute inpatient rehab (3hours/day)      Assistance Recommended at Discharge Intermittent Supervision/Assistance  Patient can return home with the following  A lot of help with walking and/or transfers;A lot of help with bathing/dressing/bathroom;Assistance with cooking/housework;Assist for transportation;Help with stairs or ramp for entrance    Equipment Recommendations BSC/3in1  Recommendations for Other Services  Rehab consult    Functional Status Assessment Patient has had a recent decline in their functional status and demonstrates the ability to make significant improvements in function in a reasonable and predictable amount of time.      Precautions / Restrictions Precautions Precautions: Fall Precaution Comments: diplopia, dysconjugate gaze Restrictions Weight Bearing Restrictions: No      Mobility  Bed Mobility Overal bed mobility: Needs Assistance Bed Mobility: Supine to Sit, Sit to Supine     Supine to sit: Min guard Sit to supine: Min guard        Transfers Overall transfer level: Needs assistance Equipment used: Rolling walker (2 wheels) Transfers: Sit to/from Stand Sit to Stand: Min assist, From elevated surface                Ambulation/Gait Ambulation/Gait assistance: Min guard Gait Distance (Feet): 15 Feet Assistive device: Rolling walker (2 wheels) Gait Pattern/deviations: Step-to pattern Gait velocity: reduced Gait velocity interpretation: <1.31 ft/sec, indicative of household ambulator   General Gait Details: slowed step-to gait, minimal foot clearance. PT providing cues for gaze stabilization when mobilizing  Stairs            Wheelchair Mobility    Modified Rankin (Stroke Patients Only)       Balance Overall balance assessment: Needs assistance Sitting-balance support: No upper extremity supported, Feet supported Sitting balance-Leahy Scale: Fair     Standing balance support: Bilateral upper extremity supported, Reliant on assistive device for balance Standing balance-Leahy Scale: Poor                               Pertinent Vitals/Pain Pain Assessment Pain Assessment: No/denies pain    Home Living Family/patient expects to be discharged to:: Private residence Living Arrangements: Spouse/significant other Available Help at Discharge: Family;Available 24 hours/day Type of Home: House Home Access: Stairs to enter   [employer] of Steps: 1   Home Layout: One level;Laundry or work area in basement (Herbalist  to basement) Home Equipment: Rolling Walker (2 wheels);Cane - single point Additional Comments: live wtih husband, son is 2  miles away    Prior Function Prior Level of Function : Independent/Modified Independent             Mobility Comments: intermittent use of RW since recent TAVR procedure ADLs Comments: indep. RN 1x/wk to manage medication and assess vitals     Hand Dominance   Dominant Hand: Right    Extremity/Trunk Assessment   Upper Extremity Assessment Upper Extremity Assessment: Defer to OT evaluation    Lower Extremity Assessment Lower Extremity Assessment: Generalized weakness    Cervical / Trunk Assessment Cervical / Trunk Assessment: Normal  Communication   Communication: No difficulties  Cognition Arousal/Alertness: Awake/alert Behavior During Therapy: WFL for tasks assessed/performed Overall Cognitive Status: Within Functional Limits for tasks assessed                                          General Comments General comments (skin integrity, edema, etc.): VSS on RA, PT notes dysconjugate gaze, pt reports dizziness when both eyes are open when still or when mobilizing. R eye tends to laterally deviate with both eyes open.    Exercises     Assessment/Plan    PT Assessment Patient needs continued PT services  PT Problem List Decreased balance;Decreased mobility;Decreased activity tolerance;Decreased coordination;Decreased knowledge of use of DME       PT Treatment Interventions DME instruction;Gait training;Stair training;Functional mobility training;Therapeutic activities;Balance training;Neuromuscular re-education;Patient/family education    PT Goals (Current goals can be found in the Care Plan section)  Acute Rehab PT Goals Patient Stated Goal: to reduce dizziness, improve balance PT Goal Formulation: With patient Time For Goal Achievement: 05/24/22 Potential to Achieve Goals: Good    Frequency Min 4X/week     Co-evaluation               AM-PAC PT "6 Clicks" Mobility  Outcome Measure Help needed turning from your back to your side  while in a flat bed without using bedrails?: A Little Help needed moving from lying on your back to sitting on the side of a flat bed without using bedrails?: A Little Help needed moving to and from a bed to a chair (including a wheelchair)?: A Little Help needed standing up from a chair using your arms (e.g., wheelchair or bedside chair)?: A Little Help needed to walk in hospital room?: A Little Help needed climbing 3-5 steps with a railing? : Total 6 Click Score: 16    End of Session   Activity Tolerance: Patient tolerated treatment well Patient left: in bed;with call bell/phone within reach Nurse Communication: Mobility status PT Visit Diagnosis: Other abnormalities of gait and mobility (R26.89);Other symptoms and signs involving the nervous system (R29.898)    Time: 9242-6834 PT Time Calculation (min) (ACUTE ONLY): 20 min   Charges:   PT Evaluation $PT Eval Moderate Complexity: 1 Mod          Arlyss Gandy, PT, DPT Acute Rehabilitation Office [phone]   Arlyss Gandy 05/10/2022, 3:41 PM

## 2022-05-10 NOTE — ED Provider Notes (Addendum)
[facility] EMERGENCY DEPARTMENT Provider Note   CSN: [id number] Arrival date & time: 05/10/22  0026     History  Chief Complaint  Patient presents with   Dizziness   Bradycardia    Rebekah Pope is a 81 y.o. female.  The history is provided by the patient.  Dizziness Quality:  Lightheadedness Severity:  Severe Onset quality:  Sudden Timing:  Constant Progression:  Unchanged Chronicity:  New Context: standing up   Relieved by:  Nothing Worsened by:  Nothing Ineffective treatments:  None tried Associated symptoms: nausea and vomiting   Associated symptoms: no chest pain, no headaches, no palpitations, no shortness of breath, no syncope, no tinnitus and no weakness   Associated symptoms comment:  Binocular diplopia Risk factors: heart disease   Risk factors: no hx of vertigo   Patient with CAD and s/p TAVR presents with elevated BP (200s/100s), lightheadness and nausea and vomiting.  Patient also reports sudden onset binocular diplopia imaged on one on top of the other, symptoms resolve by closing one eye.       Home Medications Prior to Admission medications   Medication Sig Start Date End Date Taking? Authorizing Provider  amiodarone (PACERONE) 200 MG tablet Take 0.5 tablets (100 mg total) by mouth daily. 05/04/22   Baldo Daub, MD  amLODipine (NORVASC) 10 MG tablet Take 1 tablet (10 mg total) by mouth at bedtime. 05/04/22   Baldo Daub, MD  ENTRESTO 24-26 MG Take 1 tablet by mouth 2 (two) times daily. 05/04/22   Baldo Daub, MD  hydrALAZINE (APRESOLINE) 25 MG tablet Take 1 tablet (25 mg total) by mouth 3 (three) times daily. 05/04/22   Baldo Daub, MD  hydrALAZINE (APRESOLINE) 25 MG tablet Take 1 tablet (25 mg total) by mouth every 4 (four) hours as needed (for systolic blood pressure 170 or greater). 05/04/22   Baldo Daub, MD  levothyroxine (SYNTHROID) 112 MCG tablet Take 112 mcg by mouth daily before breakfast. 09/29/21    [provider]  Rivaroxaban (XARELTO) 15 MG TABS tablet Take 1 tablet (15 mg total) by mouth daily. 05/04/22   Baldo Daub, MD  valsartan (DIOVAN) 40 MG tablet Take 40 mg by mouth daily. 08/26/21   [provider]      Allergies    Patient has no known allergies.    Review of Systems   Review of Systems  Constitutional:  Negative for diaphoresis, fatigue and fever.  HENT:  Negative for tinnitus.   Respiratory:  Negative for shortness of breath.   Cardiovascular:  Negative for chest pain, palpitations and syncope.  Gastrointestinal:  Positive for nausea and vomiting.  Neurological:  Positive for light-headedness. Negative for facial asymmetry, speech difficulty, weakness, numbness and headaches.    Physical Exam Updated Vital Signs BP (!) 170/74   Pulse (!) 53   Temp 97.8 F (36.6 C)   Resp 16   SpO2 96%  Physical Exam Vitals and nursing note reviewed.  Constitutional:      General: She is not in acute distress.    Appearance: She is well-developed.  HENT:     Head: Normocephalic and atraumatic.     Nose: Nose normal.  Eyes:     Pupils: Pupils are equal, round, and reactive to light.  Cardiovascular:     Rate and Rhythm: Normal rate and regular rhythm.     Pulses: Normal pulses.     Heart sounds: Normal heart sounds.  Pulmonary:  Effort: Pulmonary effort is normal. No respiratory distress.     Breath sounds: Normal breath sounds.  Abdominal:     General: Bowel sounds are normal. There is no distension.     Palpations: Abdomen is soft.     Tenderness: There is no abdominal tenderness. There is no guarding or rebound.  Genitourinary:    Vagina: No vaginal discharge.  Musculoskeletal:        General: Normal range of motion.     Cervical back: Neck supple.  Skin:    General: Skin is warm and dry.     Capillary Refill: Capillary refill takes less than 2 seconds.     Findings: No erythema or rash.  Neurological:     General: No focal deficit  present.     Mental Status: She is oriented to person, place, and time.     Deep Tendon Reflexes: Reflexes normal.     Comments: Diplopia with B eyes open sees 2 of me with one above the other  Psychiatric:        Mood and Affect: Mood normal.     ED Results / Procedures / Treatments   Labs (all labs ordered are listed, but only abnormal results are displayed)  Results for orders placed or performed during the hospital encounter of 05/10/22  CBC with Differential  Result Value Ref Range   WBC 6.3 4.0 - 10.5 K/uL   RBC 4.46 3.87 - 5.11 MIL/uL   Hemoglobin 13.3 12.0 - 15.0 g/dL   HCT 80.0 34.9 - 17.9 %   MCV 90.6 80.0 - 100.0 fL   MCH 29.8 26.0 - 34.0 pg   MCHC 32.9 30.0 - 36.0 g/dL   RDW 15.0 56.9 - 79.4 %   Platelets 189 150 - 400 K/uL   nRBC 0.0 0.0 - 0.2 %   Neutrophils Relative % 62 %   Neutro Abs 3.9 1.7 - 7.7 K/uL   Lymphocytes Relative 28 %   Lymphs Abs 1.7 0.7 - 4.0 K/uL   Monocytes Relative 7 %   Monocytes Absolute 0.4 0.1 - 1.0 K/uL   Eosinophils Relative 2 %   Eosinophils Absolute 0.1 0.0 - 0.5 K/uL   Basophils Relative 1 %   Basophils Absolute 0.0 0.0 - 0.1 K/uL   Immature Granulocytes 0 %   Abs Immature Granulocytes 0.02 0.00 - 0.07 K/uL  Comprehensive metabolic panel  Result Value Ref Range   Sodium 139 135 - 145 mmol/L   Potassium 4.1 3.5 - 5.1 mmol/L   Chloride 107 98 - 111 mmol/L   CO2 23 22 - 32 mmol/L   Glucose, Bld 125 (H) 70 - 99 mg/dL   BUN 32 (H) 8 - 23 mg/dL   Creatinine, Ser 8.01 (H) 0.44 - 1.00 mg/dL   Calcium 9.1 8.9 - 65.5 mg/dL   Total Protein 7.0 6.5 - 8.1 g/dL   Albumin 3.9 3.5 - 5.0 g/dL   AST 22 15 - 41 U/L   ALT 15 0 - 44 U/L   Alkaline Phosphatase 60 38 - 126 U/L   Total Bilirubin 0.5 0.3 - 1.2 mg/dL   GFR, Estimated 46 (L) >60 mL/min   Anion gap 9 5 - 15  Magnesium  Result Value Ref Range   Magnesium 2.2 1.7 - 2.4 mg/dL  Brain natriuretic peptide  Result Value Ref Range   B Natriuretic Peptide 271.6 (H) 0.0 - 100.0 pg/mL   Troponin I (High Sensitivity)  Result Value Ref Range   Troponin  I (High Sensitivity) 105 (HH) <18 ng/L  Troponin I (High Sensitivity)  Result Value Ref Range   Troponin I (High Sensitivity) 114 (HH) <18 ng/L   DG Chest Portable 1 View  Result Date: 05/10/2022 CLINICAL DATA:  Near syncope EXAM: PORTABLE CHEST 1 VIEW COMPARISON:  04/12/2022 FINDINGS: Cardiomegaly. Aortic atherosclerosis. No confluent opacities, effusions or edema. No acute bony abnormality. IMPRESSION: Cardiomegaly.  No active disease. Electronically Signed   By: Charlett Nose M.D.   On: 05/10/2022 01:12    EKG EKG Interpretation  Date/Time:  Sunday May 10 2022 00:27:21 EST Ventricular Rate:  56 PR Interval:  150 QRS Duration: 166 QT Interval:  542 QTC Calculation: 523 R Axis:   -58 Text Interpretation: Sinus bradycardia with occasional Premature ventricular complexes Left axis deviation Left bundle branch block No acute changes Confirmed by Nicanor Alcon, Flem Enderle ([id number]) on 05/10/2022 2:00:55 AM  Radiology DG Chest Portable 1 View  Result Date: 05/10/2022 CLINICAL DATA:  Near syncope EXAM: PORTABLE CHEST 1 VIEW COMPARISON:  04/12/2022 FINDINGS: Cardiomegaly. Aortic atherosclerosis. No confluent opacities, effusions or edema. No acute bony abnormality. IMPRESSION: Cardiomegaly.  No active disease. Electronically Signed   By: Charlett Nose M.D.   On: 05/10/2022 01:12    Procedures Procedures    Medications Ordered in ED Medications  enoxaparin (LOVENOX) injection 40 mg (has no administration in time range)  ondansetron (ZOFRAN) injection 4 mg (4 mg Intravenous Given 05/10/22 0115)    ED Course/ Medical Decision Making/ A&P                           Medical Decision Making Lightheadness with nausea and emesis and diplopia.    Amount and/or Complexity of Data Reviewed External Data Reviewed: labs and notes.    Details: Previous notes and labs reviewed Labs: ordered.    Details: All labs reviewed: troponins  elevated 105, second more elevated 114.  BNP elevated 271.6, normal white count 6.3 normal hemoglobin 13.3, normal platelet count.  Normal sodium 139, normal potassium 4.1, elevated creatinine 1.17.   Radiology: ordered and independent interpretation performed.    Details: Normal CXR by me  ECG/medicine tests: ordered and independent interpretation performed. Decision-making details documented in ED Course. Discussion of management or test interpretation with external provider(s): Dr. Otelia Limes, consulted due to diplopia with images stacked.  He states I should order MRI of the brain.  Likely ocular in nature.  Dr. Anselm Jungling who will see the patient for cardiology Dr. Margo Aye, triad who will admit the patient   Risk Decision regarding hospitalization.    Final Clinical Impression(s) / ED Diagnoses Final diagnoses:  Bradycardia  Elevated troponin I level   The patient appears reasonably stabilized for admission considering the current resources, flow, and capabilities available in the ED at this time, and I doubt any other [facility] requiring further screening and/or treatment in the ED prior to admission.      Joell Usman, MD 05/10/22 [phone]

## 2022-05-10 NOTE — ED Notes (Signed)
[place] Sink (daughter) would like to be called with status update on pt. Phone is [phone]

## 2022-05-10 NOTE — Evaluation (Signed)
Occupational Therapy Evaluation Patient Details Name: Rebekah Pope MRN: [id number] DOB: 13-Jun-1940 Today's Date: 05/10/2022   History of Present Illness Rebekah Pope is a 81 y.o. female who presented with sudden onset nausea, dizziness, weakness, and emesis. 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 PMHx: complex regional pain syndrome, essential HTN, HLD, hypothyroidism, a fib, TVAR.   Clinical Impression   Rebekah Pope was evaluated s/p the above admission list, she is mod I at baseline and has had an home health RN 1x/wk since recent TAVR to assist with vital assessments and medication management. Upon evaluation pt was significantly limited by impaired vision with double vision in the L visual field, dysconjugate gaze, and difficulty tracking. Pt reported improved DV and blurriness with R eye occulusion (Improved DV but decreased acuity with L occlusion). She also demonstrated functional deficits in balance, weakness and decreased activity tolerance. Overall she required up to mod A for mobility and max A for ADLs. Pt limited to side stepping at the edge of the stretcher this date due to fear of falling. OT to continue to follow acutely. Recommend d/c to AIR for maximal functional improvement.      Recommendations for follow up therapy are one component of a multi-disciplinary discharge planning process, led by the attending physician.  Recommendations may be updated based on patient status, additional functional criteria and insurance authorization.   Follow Up Recommendations  Acute inpatient rehab (3hours/day)     Assistance Recommended at Discharge Frequent or constant Supervision/Assistance  Patient can return home with the following A lot of help with walking and/or transfers;A lot of help with bathing/dressing/bathroom;Assistance with cooking/housework;Direct supervision/assist for medications management;Direct supervision/assist  for financial management;Assist for transportation;Help with stairs or ramp for entrance    Functional Status Assessment  Patient has had a recent decline in their functional status and demonstrates the ability to make significant improvements in function in a reasonable and predictable amount of time.  Equipment Recommendations  Other (comment) (defer)    Recommendations for Other Services Rehab consult     Precautions / Restrictions Precautions Precautions: Fall Precaution Comments: diplopia Restrictions Weight Bearing Restrictions: No      Mobility Bed Mobility Overal bed mobility: Needs Assistance Bed Mobility: Sit to Supine, Supine to Sit     Supine to sit: Min assist Sit to supine: Min assist   General bed mobility comments: with eyes closed due to dizziness and nausea    Transfers Overall transfer level: Needs assistance Equipment used: Rolling walker (2 wheels) Transfers: Sit to/from Stand Sit to Stand: Mod assist           General transfer comment: mod A for balance and pt fear of falling      Balance Overall balance assessment: Needs assistance Sitting-balance support: Feet supported Sitting balance-Leahy Scale: Fair Sitting balance - Comments: wtih 1 eye closed   Standing balance support: Bilateral upper extremity supported, During functional activity Standing balance-Leahy Scale: Poor                             ADL either performed or assessed with clinical judgement   ADL Overall ADL's : Needs assistance/impaired Eating/Feeding: Independent;Sitting   Grooming: Set up;Sitting   Upper Body Bathing: Set up;Sitting   Lower Body Bathing: Maximal assistance;Sit to/from stand   Upper Body Dressing : Set up;Sitting   Lower Body Dressing: Maximal assistance;Sit to/from stand   Toilet Transfer: Moderate assistance;Stand-pivot;BSC/3in1  Toileting- Clothing Manipulation and Hygiene: Min guard;Sitting/lateral lean        Functional mobility during ADLs: Moderate assistance General ADL Comments: Limited by DV, nausea, poor balance, generalized weakness. ED eval without AD. mod A for safety and side stepping/pivoting only     Vision Baseline Vision/History: 1 Wears glasses Patient Visual Report: Diplopia;Blurring of vision;Nausea/blurring vision with head movement Vision Assessment?: Yes Eye Alignment: Impaired (comment) Ocular Range of Motion: Impaired-to be further tested in functional context Alignment/Gaze Preference: Within Defined Limits Tracking/Visual Pursuits: Right eye does not track medially;Decreased smoothness of horizontal tracking;Impaired - to be further tested in functional context Saccades: Additional eye shifts occurred during testing Convergence: Impaired - to be further tested in functional context Diplopia Assessment: Disappears with one eye closed;Objects split on top of one another;Only with left gaze Additional Comments: complex vision deficits. improves with R eye closed. Pt reports no DV but increased blurriness with L eye closed     Perception Perception Perception Tested?: No   Praxis Praxis Praxis tested?: Not tested    Pertinent Vitals/Pain Pain Assessment Pain Assessment: Faces Faces Pain Scale: Hurts a little bit Pain Location: generalized with movement Pain Descriptors / Indicators: Grimacing Pain Intervention(s): Limited activity within patient's tolerance, Monitored during session     Hand Dominance Right   Extremity/Trunk Assessment Upper Extremity Assessment Upper Extremity Assessment: Generalized weakness (coordination testing difficult due to dizziness and DV. ROM and strength equal both sides. Denies paraesthesias)   Lower Extremity Assessment Lower Extremity Assessment: Defer to PT evaluation   Cervical / Trunk Assessment Cervical / Trunk Assessment: Normal   Communication Communication Communication: No difficulties   Cognition  Arousal/Alertness: Awake/alert Behavior During Therapy: WFL for tasks assessed/performed Overall Cognitive Status: Within Functional Limits for tasks assessed                                 General Comments: overall WFL for basic tasks assessed and orientation     General Comments  VSS on RA    Exercises     Shoulder Instructions      Home Living Family/patient expects to be discharged to:: Private residence Living Arrangements: Spouse/significant other (SNF) Available Help at Discharge: Family;Available 24 hours/day Type of Home: House Home Access: Stairs to enter [employer] of Steps: 1   Home Layout: One level;Laundry or work area in basement (Theatre manager to basement)     Bathroom Shower/Tub: Chief Strategy Officer: Handicapped height     Home Equipment: Agricultural consultant (2 wheels)   Additional Comments: live wtih husband, son is 2 miles away      Prior Functioning/Environment Prior Level of Function : Independent/Modified Independent             Mobility Comments: pt was using RW s/p recent TVAR procedure, has not been using it as much recent. No falls ADLs Comments: indep. RN 1x/wk to manage medication and assess vitals        OT Problem List: Decreased strength;Decreased range of motion;Decreased activity tolerance;Impaired vision/perception;Decreased safety awareness;Decreased knowledge of use of DME or AE;Decreased knowledge of precautions      OT Treatment/Interventions: Self-care/ADL training;Therapeutic exercise;DME and/or AE instruction;Therapeutic activities;Visual/perceptual remediation/compensation;Patient/family education;Balance training    OT Goals(Current goals can be found in the care plan section) Acute Rehab OT Goals Patient Stated Goal: to feel better OT Goal Formulation: With patient Time For Goal Achievement: 05/24/22 Potential to Achieve Goals: Good ADL  Goals Pt Will Perform Grooming: with  modified independence;standing Pt Will Perform Lower Body Dressing: with min guard assist;sit to/from stand Pt Will Transfer to Toilet: with min guard assist;ambulating Pt Will Perform Toileting - Clothing Manipulation and hygiene: Independently;sitting/lateral leans Additional ADL Goal #1: Pt will indep utilize vision strategies to reduce DV to optimize safety during ADLS and mobility  OT Frequency: Min 2X/week    Co-evaluation              AM-PAC OT "6 Clicks" Daily Activity     Outcome Measure Help from another person eating meals?: None Help from another person taking care of personal grooming?: A Little Help from another person toileting, which includes using toliet, bedpan, or urinal?: A Lot Help from another person bathing (including washing, rinsing, drying)?: A Lot Help from another person to put on and taking off regular upper body clothing?: A Little Help from another person to put on and taking off regular lower body clothing?: A Lot 6 Click Score: 16   End of Session Equipment Utilized During Treatment: Gait belt Nurse Communication: Mobility status  Activity Tolerance: Patient tolerated treatment well Patient left: in bed;with call bell/phone within reach  OT Visit Diagnosis: Unsteadiness on feet (R26.81);Other abnormalities of gait and mobility (R26.89);Muscle weakness (generalized) (M62.81);Dizziness and giddiness (R42)                Time: [id number] OT Time Calculation (min): 18 min Charges:  OT General Charges $OT Visit: 1 Visit OT Evaluation $OT Eval Moderate Complexity: 1 Mod    Gracia Saggese D Causey 05/10/2022, 11:34 AM

## 2022-05-10 NOTE — H&P (Signed)
History and Physical  Rebekah Pope ZOX:[id number] DOB: Dec 05, 1940 DOA: 05/10/2022  Referring physician: Dr. Nicanor Alcon, EDP PCP: Erskine Emery, NP  Outpatient Specialists: Cardiology, electrophysiology. Patient coming from: Home.  Chief Complaint: Nausea, vomiting, dizziness, weakness.  HPI: Rebekah Pope is a 81 y.o. 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  Last known well around 1 PM.  Was sitting at her table and paying her bills.  No slurred speech or focal motor neurological deficits.  Associated with lightheadedness and profound nausea and vomiting x 3.  Her double vision persists in the ED, improves when she closes one eye.  EDP discussed the case with neurology who recommended getting an MRI brain to rule out CVA, also suspects eye problem, which may necessitate a neuro-ophthalmologist.  Additionally, high sensitivity troponins were noted to be elevated and up trended 140 from 100.  Cardiology consulted by EDP to assist with the management.  The patient was admitted by [facility], hospitalist service.  Addendum:  MRI brain returned positive for acute/subacute CVA.  ED Course: Tmax 97.8.  BP 170/74, pulse 63, respiratory 16, O2 sats was 96% on room air.  Lab studies remarkable for BNP 271, troponin 105, 114.  BUN 32, creatinine 1.17, GFR 46.  Review of Systems: Review of systems as noted in the HPI. All other systems reviewed and are negative.   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 Excell Seltzer & Dr. Laneta Simmers   Severe aortic stenosis    Past Surgical History:  Procedure Laterality Date   CESAREAN SECTION     INTRAOPERATIVE TRANSTHORACIC ECHOCARDIOGRAM N/A 02/10/2022   Procedure: INTRAOPERATIVE TRANSTHORACIC ECHOCARDIOGRAM;  Surgeon: Tonny Bollman, MD;  Location: [facility] INVASIVE CV LAB;  Service: Open Heart Surgery;  Laterality: N/A;   RIGHT HEART CATH AND CORONARY ANGIOGRAPHY N/A 10/31/2021   Procedure: RIGHT HEART CATH AND CORONARY ANGIOGRAPHY;  Surgeon: Kathleene Hazel, MD;  Location: [facility] INVASIVE CV LAB;  Service: Cardiovascular;  Laterality: N/A;   TRANSCATHETER AORTIC VALVE REPLACEMENT, TRANSFEMORAL N/A 02/10/2022   Procedure: Transcatheter Aortic Valve Replacement, Transfemoral;  Surgeon: Tonny Bollman, MD;  Location: [facility] INVASIVE CV LAB;  Service: Open Heart Surgery;  Laterality: N/A;    Social History:  reports that she has never smoked. She has been exposed to tobacco smoke. She has never used smokeless tobacco. She reports that she does not drink alcohol and does not use drugs.   No Known Allergies  Family History  Problem Relation Age of Onset   Dementia Mother    Pneumonia Mother    Hip fracture Mother    Heart disease Father       Prior to Admission medications   Medication Sig Start Date End Date Taking? Authorizing Provider  amiodarone (  PACERONE) 200 MG tablet Take 0.5 tablets (100 mg total) by mouth daily. 05/04/22   Richardo Priest, MD  amLODipine (NORVASC) 10 MG tablet Take 1 tablet (10 mg total) by mouth at bedtime. 05/04/22   Richardo Priest, MD  ENTRESTO 24-26 MG Take 1 tablet by mouth 2 (two) times daily. 05/04/22   Richardo Priest, MD  hydrALAZINE (APRESOLINE) 25 MG tablet Take 1  tablet (25 mg total) by mouth 3 (three) times daily. 05/04/22   Richardo Priest, MD  hydrALAZINE (APRESOLINE) 25 MG tablet Take 1 tablet (25 mg total) by mouth every 4 (four) hours as needed (for systolic blood pressure [id number] or greater). 05/04/22   Richardo Priest, MD  levothyroxine (SYNTHROID) 112 MCG tablet Take 112 mcg by mouth daily before breakfast. 09/29/21   [provider]  Rivaroxaban (XARELTO) 15 MG TABS tablet Take 1 tablet (15 mg total) by mouth daily. 05/04/22   Richardo Priest, MD  valsartan (DIOVAN) 40 MG tablet Take 40 mg by mouth daily. 08/26/21   [provider]    Physical Exam: BP (!) 170/74   Pulse (!) 53   Temp 97.8 F (36.6 C)   Resp 16   SpO2 96%   General: 81 y.o. year-old female well developed well nourished in no acute distress.  Alert and oriented x3. Cardiovascular: Bradycardic with no rubs or gallops.  No thyromegaly or JVD noted.  No lower extremity edema. 2/4 pulses in all 4 extremities. Respiratory: Clear to auscultation with no wheezes or rales. Good inspiratory effort. Abdomen: Soft nontender nondistended with normal bowel sounds x4 quadrants. Muskuloskeletal: No cyanosis, clubbing or edema noted bilaterally Neuro: CN II-XII intact, strength, sensation, reflexes Skin: No ulcerative lesions noted or rashes Psychiatry: Judgement and insight appear normal. Mood is appropriate for condition and setting          Labs on Admission:  Basic Metabolic Panel: Recent Labs  Lab 05/10/22 0043  NA 139  K 4.1  CL 107  CO2 23  GLUCOSE 125*  BUN 32*  CREATININE 1.17*  CALCIUM 9.1  MG 2.2   Liver Function Tests: Recent Labs  Lab 05/10/22 0043  AST 22  ALT 15  ALKPHOS 60  BILITOT 0.5  PROT 7.0  ALBUMIN 3.9   No results for input(s): "LIPASE", "AMYLASE" in the last 168 hours. No results for input(s): "AMMONIA" in the last 168 hours. CBC: Recent Labs  Lab 05/10/22 0043  WBC 6.3  NEUTROABS 3.9  HGB 13.3  HCT 40.4  MCV 90.6   PLT 189   Cardiac Enzymes: No results for input(s): "CKTOTAL", "CKMB", "CKMBINDEX", "TROPONINI" in the last 168 hours.  BNP (last 3 results) Recent Labs    05/10/22 0043  BNP 271.6*    ProBNP (last 3 results) No results for input(s): "PROBNP" in the last 8760 hours.  CBG: No results for input(s): "GLUCAP" in the last 168 hours.  Radiological Exams on Admission: DG Chest Portable 1 View  Result Date: 05/10/2022 CLINICAL DATA:  Near syncope EXAM: PORTABLE CHEST 1 VIEW COMPARISON:  04/12/2022 FINDINGS: Cardiomegaly. Aortic atherosclerosis. No confluent opacities, effusions or edema. No acute bony abnormality. IMPRESSION: Cardiomegaly.  No active disease. Electronically Signed   By: Rolm Baptise M.D.   On: 05/10/2022 01:12    EKG: I independently viewed the EKG done and my findings are as followed: Sinus bradycardia rate of 56.  Occasional PVCs.  LBBB.  Assessment/Plan Present on Admission:  Elevated troponin  Principal Problem:  Elevated troponin  Elevated troponin, suspect demand ischemia in the setting of acute/subacute CVA Presented with Global weakness, double vision, lightheadedness MRI brain revealed scattered tiny acute to subacute infarct suggesting central embolic disease.  Brain atrophy and chronic small vessel ischemia with small chronic cerebellar infarcts bilaterally. Follow complete 2D echo Cardiology consulted by EDP  Acute/subacute CVA Prior to admission on Xarelto Defer antiplatelet choice to neurology Defer imaging choice for head and neck vasculature to neurology. Lipitor 80 mg daily Frequent neurochecks PT/OT/speech therapist assessment Permissive hypertension, treat SBP greater than 220 or DBP greater than 120 Fall/aspiration precautions Complete 2D echo with bubble study Fasting lipid panel/A1c Neurology consulted by EDP  Paroxysmal A-fib on Xarelto Resume home Xarelto when okay with neurology Currently rate controlled Resume home amiodarone  if okay with neurology Monitor on telemetry  Hypothyroidism Resume home levothyroxine  Sick sinus syndrome with consideration for permanent pacemaker Defer to cardiology  Status post recent TAVR Defer to cardiology    Critical care time: 65 minutes.    DVT prophylaxis: Home Xarelto  Code Status: Full code  Family Communication: None at bedside  Disposition Plan: Admitted to progressive care unit  Consults called: Neurology, cardiology  Admission status: Inpatient status.   Status is: Inpatient The patient requires at least 2 midnights for further evaluation and treatment of present condition.   Kayleen Memos MD [facility] Pager [phone]  If 7PM-7AM, please contact night-coverage [email or web address] Password [email or web address]  05/10/2022, 4:08 AM

## 2022-05-10 NOTE — Progress Notes (Signed)
Inpatient Rehab Admissions Coordinator Note:   Per therapy patient was screened for CIR candidacy by Ayra Hodgdon Luvenia Starch, CCC-SLP. At this time, pt appears to be a potential candidate for CIR. I will place an order for rehab consult for full assessment, per our protocol.  Please contact me any with questions.Wolfgang Phoenix, MS, CCC-SLP Admissions Coordinator [phone] 05/10/22 5:58 PM

## 2022-05-11 ENCOUNTER — Telehealth: Payer: Self-pay | Admitting: Cardiology

## 2022-05-11 ENCOUNTER — Encounter (HOSPITAL_COMMUNITY): Payer: Self-pay | Admitting: Internal Medicine

## 2022-05-11 ENCOUNTER — Inpatient Hospital Stay (HOSPITAL_COMMUNITY): Payer: Medicare PPO

## 2022-05-11 ENCOUNTER — Encounter: Payer: Self-pay | Admitting: Cardiology

## 2022-05-11 DIAGNOSIS — I6389 Other cerebral infarction: Secondary | ICD-10-CM

## 2022-05-11 DIAGNOSIS — R7989 Other specified abnormal findings of blood chemistry: Secondary | ICD-10-CM | POA: Diagnosis not present

## 2022-05-11 LAB — HEMOGLOBIN A1C
Hgb A1c MFr Bld: 6 % — ABNORMAL HIGH (ref 4.8–5.6)
Mean Plasma Glucose: 126 mg/dL

## 2022-05-11 LAB — ECHOCARDIOGRAM COMPLETE BUBBLE STUDY
AR max vel: 1.84 cm2
AV Area VTI: 1.87 cm2
AV Area mean vel: 1.99 cm2
AV Mean grad: 8 mmHg
AV Peak grad: 14.8 mmHg
Ao pk vel: 1.93 m/s
Area-P 1/2: 2.54 cm2
MV VTI: 1.54 cm2
S' Lateral: 2.9 cm

## 2022-05-11 MED ORDER — OXYCODONE HCL 5 MG PO TABS: 5.0000 mg | ORAL_TABLET | ORAL | Status: DC | PRN

## 2022-05-11 MED ORDER — SENNOSIDES-DOCUSATE SODIUM 8.6-50 MG PO TABS: 1.0000 | ORAL_TABLET | Freq: Every evening | ORAL | Status: DC | PRN

## 2022-05-11 MED ORDER — ONDANSETRON HCL 4 MG/2ML IJ SOLN: 4.0000 mg | Freq: Four times a day (QID) | INTRAMUSCULAR | Status: DC | PRN

## 2022-05-11 MED ORDER — IPRATROPIUM-ALBUTEROL 0.5-2.5 (3) MG/3ML IN SOLN: 3.0000 mL | RESPIRATORY_TRACT | Status: DC | PRN

## 2022-05-11 MED ORDER — TRAZODONE HCL 50 MG PO TABS: 50.0000 mg | ORAL_TABLET | Freq: Every evening | ORAL | Status: DC | PRN

## 2022-05-11 MED ORDER — ACETAMINOPHEN 325 MG PO TABS: 650.0000 mg | ORAL_TABLET | Freq: Four times a day (QID) | ORAL | Status: DC | PRN

## 2022-05-11 MED ORDER — GUAIFENESIN 100 MG/5ML PO LIQD: 5.0000 mL | ORAL | Status: DC | PRN

## 2022-05-11 NOTE — ED Notes (Signed)
Sheet soiledfrom the purewick  bed linen changed pt repositioned

## 2022-05-11 NOTE — Progress Notes (Signed)
Inpatient Rehab Admissions Coordinator:    I met with Pt. To discuss potential CIR admit. She is interested, husband can provide 24/7 support. I will open a case with her insurance and pursue for admit.   Clemens Catholic, [place], [place] Admissions Coordinator  [phone] ([place]) [phone] (office)

## 2022-05-11 NOTE — Telephone Encounter (Signed)
Pt daughter would like to let Dr. Dulce Sellar know pt is in the hospital.

## 2022-05-11 NOTE — Progress Notes (Signed)
Occupational Therapy Treatment Patient Details Name: Rebekah Pope MRN: [id number] DOB: [birth year] Today's Date: 05/11/2022   History of present illness Rebekah Pope is a 81 y.o. female who presented to [facility] hospital 05/10/2022 with sudden onset nausea, dizziness, weakness, and emesis. 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 PMHx: complex regional pain syndrome, essential HTN, HLD, hypothyroidism, a fib, TVAR.   OT comments  Rebekah Pope is making steady progress but remains limited by vision impairments and generalized weakness. Session focused on vision assessment and strategies to reduce diplopia and increased safety. Per report pt's DV is present in midline-right superior environment. Pt is L eye dominant. Occluded glasses with tape on R lens with improvement of DV. Educated pt on progressing tape to a smaller piece each day and to wear the glasses during all functional tasks. Pt was very appreciative and verbalized understanding. OT to continue to follow. POC remains appropriate.    Recommendations for follow up therapy are one component of a multi-disciplinary discharge planning process, led by the attending physician.  Recommendations may be updated based on patient status, additional functional criteria and insurance authorization.    Follow Up Recommendations  Acute inpatient rehab (3hours/day)     Assistance Recommended at Discharge Frequent or constant Supervision/Assistance  Patient can return home with the following  A lot of help with walking and/or transfers;A lot of help with bathing/dressing/bathroom;Assistance with cooking/housework;Direct supervision/assist for medications management;Direct supervision/assist for financial management;Assist for transportation;Help with stairs or ramp for entrance   Equipment Recommendations  Other (comment)    Recommendations for Other Services Rehab consult    Precautions /  Restrictions Precautions Precautions: Fall Precaution Comments: diplopia, dysconjugate gaze Restrictions Weight Bearing Restrictions: No       Mobility Bed Mobility Overal bed mobility: Needs Assistance Bed Mobility: Supine to Sit, Sit to Supine     Supine to sit: Min guard Sit to supine: Min guard        Transfers Overall transfer level: Needs assistance Equipment used: Rolling walker (2 wheels) Transfers: Sit to/from Stand Sit to Stand: Min guard, From elevated surface                 Balance Overall balance assessment: Needs assistance Sitting-balance support: No upper extremity supported, Feet supported Sitting balance-Leahy Scale: Fair                                     ADL either performed or assessed with clinical judgement   ADL Overall ADL's : Needs assistance/impaired                                     Functional mobility during ADLs: Min guard;Rolling walker (2 wheels) General ADL Comments: session focused on vision and occlusion strategies    Extremity/Trunk Assessment Upper Extremity Assessment Upper Extremity Assessment: Overall WFL for tasks assessed   Lower Extremity Assessment Lower Extremity Assessment: Defer to PT evaluation        Vision   Vision Assessment?: Yes Eye Alignment: Impaired (comment) Ocular Range of Motion: Impaired-to be further tested in functional context Alignment/Gaze Preference: Within Defined Limits Tracking/Visual Pursuits: Right eye does not track medially;Decreased smoothness of horizontal tracking;Impaired - to be further tested in functional context Saccades: Additional eye shifts occurred during testing Convergence: Impaired - to be further  tested in functional context Diplopia Assessment: Disappears with one eye closed;Objects split on top of one another;Only with left gaze Additional Comments: non-dominant R eye occluded with vast improvement in DV and dizziness    Perception Perception Perception: Not tested   Praxis Praxis Praxis: Intact    Cognition Arousal/Alertness: Awake/alert Behavior During Therapy: WFL for tasks assessed/performed Overall Cognitive Status: Within Functional Limits for tasks assessed                                          Exercises Exercises: Other exercises Other Exercises Other Exercises: R eye occlusion with tape on glasses    Shoulder Instructions       General Comments VSS on RA. Daughter from [place] present at the start of the session    Pertinent Vitals/ Pain       Pain Assessment Pain Assessment: Faces Faces Pain Scale: No hurt Pain Intervention(s): Monitored during session, Limited activity within patient's tolerance  Home Living Family/patient expects to be discharged to:: Private residence Living Arrangements: Spouse/significant other                                          Frequency  Min 2X/week        Progress Toward Goals  OT Goals(current goals can now be found in the care plan section)  Progress towards OT goals: Progressing toward goals  Acute Rehab OT Goals Patient Stated Goal: to get vision back OT Goal Formulation: With patient Time For Goal Achievement: 05/24/22 Potential to Achieve Goals: Good ADL Goals Pt Will Perform Grooming: with modified independence;standing Pt Will Perform Lower Body Dressing: with min guard assist;sit to/from stand Pt Will Transfer to Toilet: with min guard assist;ambulating Pt Will Perform Toileting - Clothing Manipulation and hygiene: Independently;sitting/lateral leans Additional ADL Goal #1: Pt will indep utilize vision strategies to reduce DV to optimize safety during ADLS and mobility  Plan Discharge plan remains appropriate       AM-PAC OT "6 Clicks" Daily Activity     Outcome Measure   Help from another person eating meals?: None Help from another person taking care of personal grooming?: A  Little Help from another person toileting, which includes using toliet, bedpan, or urinal?: A Lot Help from another person bathing (including washing, rinsing, drying)?: A Lot Help from another person to put on and taking off regular upper body clothing?: A Little Help from another person to put on and taking off regular lower body clothing?: A Lot 6 Click Score: 16    End of Session    OT Visit Diagnosis: Unsteadiness on feet (R26.81);Other abnormalities of gait and mobility (R26.89);Muscle weakness (generalized) (M62.81);Dizziness and giddiness (R42)   Activity Tolerance Patient tolerated treatment well   Patient Left in bed;with call bell/phone within reach   Nurse Communication Mobility status        Time: 1191-4782 OT Time Calculation (min): 17 min  Charges: OT General Charges $OT Visit: 1 Visit OT Treatments $Therapeutic Activity: 8-22 mins    Donia Pounds 05/11/2022, 5:00 PM

## 2022-05-11 NOTE — Progress Notes (Addendum)
STROKE TEAM PROGRESS NOTE   INTERVAL HISTORY Her husband is at the bedside.   Patient endorses ongoing diplopia that improves with closing one eye.   MRI brain with multiple punctate to small acute ischemic infarctions in separate vascular territories.   Discussed with patient's daughter via telephone  Right eye nystagmus in right gaze with some nystagmus of the left eye in left gaze as well. Right INO. Patient will need eye patch for assistance with diplopia.   Can consider transition off of Xarelto, will discuss with patient's daughter  Vitals:   05/11/22 0645 05/11/22 0724 05/11/22 1040 05/11/22 1125  BP:  (!) 165/64 (!) 166/120   Pulse:  (!) 48 (!) 50 (!) 52  Resp:  17 18 14   Temp: 98 F (36.7 C) 97.8 F (36.6 C)  97.9 F (36.6 C)  TempSrc: Oral Oral    SpO2:  98% 96% 97%   CBC:  Recent Labs  Lab 05/10/22 0043 05/10/22 0612  WBC 6.3  --   NEUTROABS 3.9  --   HGB 13.3 12.6  HCT 40.4 37.0  MCV 90.6  --   PLT 189  --    Basic Metabolic Panel:  Recent Labs  Lab 05/10/22 0043 05/10/22 0612  NA 139 139  K 4.1 4.2  CL 107 106  CO2 23  --   GLUCOSE 125* 120*  BUN 32* 31*  CREATININE 1.17* 1.00  CALCIUM 9.1  --   MG 2.2  --    Lipid Panel:  Recent Labs  Lab 05/10/22 0511  CHOL 268*  TRIG 27  HDL 87  CHOLHDL 3.1  VLDL 5  LDLCALC 176*   HgbA1c:  Recent Labs  Lab 05/10/22 0511  HGBA1C 6.0*   Urine Drug Screen:  Recent Labs  Lab 05/10/22 0731  LABOPIA NONE DETECTED  COCAINSCRNUR NONE DETECTED  LABBENZ NONE DETECTED  AMPHETMU NONE DETECTED  THCU NONE DETECTED  LABBARB NONE DETECTED    Alcohol Level  Recent Labs  Lab 05/10/22 0511  ETH <10   IMAGING past 24 hours No results found.  PHYSICAL EXAM  Physical Exam  Constitutional: Appears well-developed and well-nourished.  Psych: Affect appropriate to situation, calm and cooperative with examination  Eyes: No scleral injection HENT: No OP obstrucion MSK: no joint deformities.   Cardiovascular: Irregular rate and rhythm  Respiratory: Effort normal, non-labored breathing on room air GI: Soft.  No distension. There is no tenderness.  Skin: WDI  Neuro: Mental Status: Patient is awake, alert, oriented to person, place, month, year, and situation. Patient is able to give a clear and coherent history. No signs of aphasia or neglect Cranial Nerves: II: Visual Fields are full. PERRL III,IV, VI: Right INO present with diplopia   V: Facial sensation is symmetric to temperature VII: Persistent right NLF asymmetry noted  VIII: Hearing is intact to voice X: Palate elevates symmetrically XI: Shoulder shrug is symmetric. XII: Tongue protrudes midline without atrophy or fasciculations.  Motor: Tone is normal. Bulk is normal. 5/5 strength was present in all four extremities without asymmetry or vertical drift.  Sensory: Sensation is symmetric to light touch and temperature in the arms and legs. No extinction to DSS present.  Cerebellar: FNF and HKS are intact bilaterally  ASSESSMENT/PLAN [name] is a 81 y.o. female with history of complex regional pain syndrome of lower extremity, dizziness, HTN, HLD, hypothyroidism, PAF of Xatelto, SSS, LBBB, obstructive CAD, HFpEF, and severe aortic stenosis s/p TAVR placement in September 2023 presenting  with acute onset of nausea, dizziness, weakness, and emesis with acute onset of double vision.   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  2D Echo pending  LDL 176 HgbA1c 6.0 VTE prophylaxis - SCDs    Diet   Diet Heart Room service appropriate? Yes; Fluid consistency: Thin   Xarelto (rivaroxaban) daily prior to admission, now on Xarelto (rivaroxaban) daily.  Educated patient to take Xarelto with meals for better absorption as  patient previously taking this medication on an empty stomach Therapy recommendations:  CIR Disposition:  pending   Hypertension Home meds:  Norvasc Unstable Permissive hypertension (OK if < 220/120) but gradually normalize in 5-7 days Long-term BP goal normotensive  Hyperlipidemia Home meds:  none LDL 176, goal < 70 Add atorvastatin 80 mg   Continue statin at discharge  Prediabetes Home meds:  None HgbA1c 6.0, goal < 7.0 CBGs No results for input(s): "GLUCAP" in the last 72 hours.  SSI as appropriate Follow up with PCP for further monitoring of A1c  Other Stroke Risk Factors Advanced Age >/= 67  Obesity, There is no height or weight on file to calculate BMI., BMI >/= 30 associated with increased stroke risk, recommend weight loss, diet and exercise as appropriate  Coronary artery disease Congestive heart failure  Other Active Problems   Hospital day # 1  Rebekah Pope, AGACNP-BC [facility] Pager: [phone]   STROKE MD NOTE :  I have personally obtained history,examined this patient, reviewed notes, independently viewed imaging studies, participated in medical decision making and plan of care.ROS completed by me personally and pertinent positives fully documented  I have made any additions or clarifications directly to the above note. Agree with note above.  Patient presented with diplopia secondary to pontine stroke per MRI shows multiple cortical and subcortical infarcts likely from atrial fibrillation despite being on anticoagulation with Xarelto.  I had a long discussion with the patient and husband there is no definite data suggesting changing to Eliquis or alternative agent is necessarily superior or adding aspirin is any better.  I advised the patient to take Xarelto with her proper meal and not on empty stomach as she was taking before.  Mobilize out of bed.  Therapy consults.  Continue ongoing stroke work-up.  Greater than 50% time during this  50-minute visit were spent in counseling and coordination of care about her stroke and diplopia and answering questions about A-fib and stroke prevention  Delia Heady, MD Medical Director [facility] Pager: [phone] 05/11/2022 4:58 PM  To contact Stroke Continuity provider, please refer to [email or web address]. After hours, contact General Neurology

## 2022-05-11 NOTE — Progress Notes (Signed)
Echocardiogram 2D Echocardiogram has been performed without bubble study due to lack of IV access.  Rebekah Pope 05/11/2022, 10:05 AM  When flushing IV to prepare for bubble study, I noticed that the IV was leaking. Upon further examination, the IV catheter was simply laying on top of the skin underneath the tagaderm, not inserted in the patient. After cleaning her arm, I attempted to gain IV access in the left hand but was unable to, so patient is being sent back to ED without IV.

## 2022-05-11 NOTE — Progress Notes (Signed)
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 MRI brain showed scattered tiny acute to subacute infarct suggesting central embolic disease. Neurology was consulted.    Assessment & Plan:  Principal Problem:   Elevated troponin      Acute/subacute CVA -MRI brain consistent with acute to subacute infarct suggestive of central embolic stroke.  Possible from recent TAVR. -A1c-pending, LDL-176 - Echocardiogram-performed, results pending - CTA head and neck?.  Neurology -Currently on Xarelto and Lipitor   Paroxysmal atrial fibrillation -Continue Xarelto.  Seen by cardiology, no further recommendations at this time besides continuing Xarelto.-Continue amiodarone 100 mg p.o. daily   Hypothyroidism -Continue Synthroid.  TSH-normal   Sick sinus syndrome -Seen by cardiology   S/p recent TAVR -Stable     DVT prophylaxis: Xarelto Code Status: Full code Family Communication:  Spouse at bedside  Status is: Inpatient On going stroke work up. Will need CIR. Cont hosp stau    Subjective:  Feels ok no complaints. Still has droopy eye lids on the left and some double vision.   Examination:  General exam: Appears calm and comfortable  Respiratory system: Clear to auscultation. Respiratory effort normal. Cardiovascular system: S1 & S2 heard, RRR. No JVD, murmurs, rubs, gallops or clicks. No pedal edema. Gastrointestinal system: Abdomen is nondistended, soft and nontender. No organomegaly or masses felt. Normal bowel sounds heard. Central nervous  system: Alert and oriented. No focal neurological deficits. Extremities: Symmetric 5 x 5 power. Skin: No rashes, lesions or ulcers Psychiatry: Judgement and insight appear normal. Mood & affect appropriate.     Objective: Vitals:   05/11/22 0200 05/11/22 0400 05/11/22 0645 05/11/22 0724  BP: (!) 168/68 (!) 158/64  (!) 165/64  Pulse: (!) 41 (!) 46  (!) 48  Resp: 14 18  17   Temp:   98 F (36.7 C) 97.8 F (36.6 C)  TempSrc:   Oral Oral  SpO2: 95% 98%  98%    Intake/Output Summary (Last 24 hours) at 05/11/2022 1035 Last data filed at 05/11/2022 0548 Gross per 24 hour  Intake --  Output 500 ml  Net -500 ml   There were no vitals filed for this visit.   Data Reviewed:   CBC: Recent Labs  Lab 05/10/22 0043 05/10/22 0612  WBC 6.3  --   NEUTROABS 3.9  --   HGB 13.3 12.6  HCT 40.4 37.0  MCV 90.6  --   PLT 189  --    Basic Metabolic Panel: Recent Labs  Lab 05/10/22 0043 05/10/22 0612  NA 139 139  K 4.1 4.2  CL 107 106  CO2 23  --   GLUCOSE 125* 120*  BUN 32* 31*  CREATININE 1.17* 1.00  CALCIUM 9.1  --   MG 2.2  --    GFR: Estimated Creatinine Clearance: 44 mL/min (by C-G formula based on SCr of 1 mg/dL). Liver Function Tests: Recent Labs  Lab 05/10/22 0043  AST 22  ALT 15  ALKPHOS 60  BILITOT 0.5  PROT 7.0  ALBUMIN 3.9   No results for input(s): "LIPASE", "AMYLASE" in the last 168 hours. No results for input(s): "AMMONIA"  in the last 168 hours. Coagulation Profile: Recent Labs  Lab 05/10/22 0511  INR 1.6*   Cardiac Enzymes: No results for input(s): "CKTOTAL", "CKMB", "CKMBINDEX", "TROPONINI" in the last 168 hours. BNP (last 3 results) No results for input(s): "PROBNP" in the last 8760 hours. HbA1C: No results for input(s): "HGBA1C" in the last 72 hours. CBG: No results for input(s): "GLUCAP" in the last 168 hours. Lipid Profile: Recent Labs    05/10/22 0511  CHOL 268*  HDL 87  LDLCALC 176*  TRIG 27  CHOLHDL 3.1   Thyroid Function  Tests: Recent Labs    05/10/22 0511  TSH 0.529   Anemia Panel: No results for input(s): "VITAMINB12", "FOLATE", "FERRITIN", "TIBC", "IRON", "RETICCTPCT" in the last 72 hours. Sepsis Labs: No results for input(s): "PROCALCITON", "LATICACIDVEN" in the last 168 hours.  No results found for this or any previous visit (from the past 240 hour(s)).       Radiology Studies: MR BRAIN WO CONTRAST  Result Date: 05/10/2022 CLINICAL DATA:  Syncope with cerebrovascular cause suspected EXAM: MRI HEAD WITHOUT CONTRAST TECHNIQUE: Multiplanar, multiecho pulse sequences of the brain and surrounding structures were obtained without intravenous contrast. COMPARISON:  None Available. FINDINGS: Brain: Tiny foci of restricted diffusion scattered along the bilateral peripheral cerebral hemispheres, cortical to subcortical in location. Patchy involvement in the right occipital lobe and in the posterior right frontal white matter has a weak diffusion signal suggesting more subacute timing. Tiny chronic infarcts in the bilateral cerebral cortex. Chronic lacunar infarcts in the left deep cerebral white matter. Brain atrophy that is generalized. Vascular: Major flow voids are preserved Skull and upper cervical spine: Normal marrow signal. Sinuses/Orbits: Unremarkable. IMPRESSION: 1. Scattered tiny acute to subacute infarcts suggesting central embolic disease. 2. Brain atrophy and chronic small vessel ischemia with small chronic cerebellar infarcts bilaterally. Electronically Signed   By: Tiburcio Pea M.D.   On: 05/10/2022 04:13   DG Chest Portable 1 View  Result Date: 05/10/2022 CLINICAL DATA:  Near syncope EXAM: PORTABLE CHEST 1 VIEW COMPARISON:  04/12/2022 FINDINGS: Cardiomegaly. Aortic atherosclerosis. No confluent opacities, effusions or edema. No acute bony abnormality. IMPRESSION: Cardiomegaly.  No active disease. Electronically Signed   By: Charlett Nose M.D.   On: 05/10/2022 01:12        Scheduled Meds:   amiodarone  100 mg Oral Daily   atorvastatin  80 mg Oral Daily   levothyroxine  112 mcg Oral QAC breakfast   Rivaroxaban  15 mg Oral QAC supper   Continuous Infusions:   LOS: 1 day   Time spent= 35 mins    Jacari Kirsten Joline Maxcy, MD [facility]  If 7PM-7AM, please contact night-coverage  05/11/2022, 10:35 AM

## 2022-05-11 NOTE — ED Notes (Signed)
ED TO INPATIENT HANDOFF REPORT  ED Nurse Name and Phone #: Duwayne HeckDanielle [phone]  S Name/Age/Gender Rebekah Pope 81 y.o. female Room/Bed: 033C/033C  Code Status   Code Status: Full Code  Home/SNF/Other Home Patient oriented to: self, place, time, and situation Is this baseline? Yes   Triage Complete: Triage complete  Chief Complaint Elevated troponin [R79.89]  Triage Note BIB EMS from home. Sudden onset of nausea, dizziness, and weakness with vomiting.  Stroke screen negative per [facility].  HR was found to be 53.  100% on RA.  PVCs on EMS EKG.  BP 182/69. Appt with cards for potential pacemaker on the 13th of this month.  Had mitral valve replacement 3 weeks ago .  18G LAC   Allergies No Known Allergies  Level of Care/Admitting Diagnosis ED Disposition     ED Disposition  Admit   Condition  --   Comment  [facility] [[id number]  Level of Care: Progressive [102]  Admit to Progressive based on following criteria: NEUROLOGICAL AND NEUROSURGICAL complex patients with significant risk of instability, who do not meet ICU criteria, yet require close observation or frequent assessment (< / = every 2 - 4 hours) with medical / nursing intervention.  Admit to Progressive based on following criteria: CARDIOVASCULAR & THORACIC of moderate stability with acute coronary syndrome symptoms/low risk myocardial infarction/hypertensive urgency/arrhythmias/heart failure potentially compromising stability and stable post cardiovascular intervention patients.  Admit to Progressive based on following criteria: MULTISYSTEM THREATS such as stable sepsis, metabolic/electrolyte imbalance with or without encephalopathy that is responding to early treatment.  [facility] or [facility] if equivalent level of care is available:: No  Covid Evaluation: Asymptomatic - no recent exposure (last 10 days) testing not required  Diagnosis: Elevated troponin  [321909]  Admitting Physician: Darlin DropHALL, CAROLE N [[id number]]  Attending Physician: Darlin DropHALL, CAROLE N [[id number]]  Certification:: I certify this patient will need inpatient services for at least 2 midnights  Estimated Length of Stay: 2          B Medical/Surgery History Past Medical History:  Diagnosis Date   Allergic rhinitis    Complex regional pain syndrome i of unspecified lower limb    Dizziness and giddiness    Essential (primary) hypertension    History of hiatal hernia    Hyperlipidemia    Hypothyroidism    Localized edema    Paroxysmal atrial fibrillation (HCC)    PONV (postoperative nausea and vomiting)    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 Excell Seltzerooper & Dr. Laneta SimmersBartle   Severe aortic stenosis    Past Surgical History:  Procedure Laterality Date   CESAREAN SECTION     INTRAOPERATIVE TRANSTHORACIC ECHOCARDIOGRAM N/A 02/10/2022   Procedure: INTRAOPERATIVE TRANSTHORACIC ECHOCARDIOGRAM;  Surgeon: Tonny Bollmanooper, Michael, MD;  [facility];  Service: Open Heart Surgery;  Laterality: N/A;   RIGHT HEART CATH AND CORONARY ANGIOGRAPHY N/A 10/31/2021   Procedure: RIGHT HEART CATH AND CORONARY ANGIOGRAPHY;  Surgeon: Kathleene HazelMcAlhany, Christopher D, MD;  Location: [facility] INVASIVE CV LAB;  Service: Cardiovascular;  Laterality: N/A;   TRANSCATHETER AORTIC VALVE REPLACEMENT, TRANSFEMORAL N/A 02/10/2022   Procedure: Transcatheter Aortic Valve Replacement, Transfemoral;  Surgeon: Tonny Bollmanooper, Michael, MD;  [facility];  Service: Open Heart Surgery;  Laterality: N/A;     A IV Location/Drains/Wounds Patient Lines/Drains/Airways Status     Active Line/Drains/Airways  Name Placement date Placement time Site Days   Peripheral IV 05/11/22 20 G Anterior;Proximal;Right;Lateral Forearm 05/11/22  1056  Forearm  less than 1            Intake/Output Last 24 hours  Intake/Output Summary (Last 24 hours) at  05/11/2022 1251 Last data filed at 05/11/2022 0548 Gross per 24 hour  Intake --  Output 500 ml  Net -500 ml    Labs/Imaging Results for orders placed or performed during the hospital encounter of 05/10/22 (from the past 48 hour(s))  CBC with Differential     Status: None   Collection Time: 05/10/22 12:43 AM  Result Value Ref Range   WBC 6.3 4.0 - 10.5 K/uL   RBC 4.46 3.87 - 5.11 MIL/uL   Hemoglobin 13.3 12.0 - 15.0 g/dL   HCT 40.4 36.0 - 46.0 %   MCV 90.6 80.0 - 100.0 fL   MCH 29.8 26.0 - 34.0 pg   MCHC 32.9 30.0 - 36.0 g/dL   RDW 13.9 11.5 - 15.5 %   Platelets 189 150 - 400 K/uL   nRBC 0.0 0.0 - 0.2 %   Neutrophils Relative % 62 %   Neutro Abs 3.9 1.7 - 7.7 K/uL   Lymphocytes Relative 28 %   Lymphs Abs 1.7 0.7 - 4.0 K/uL   Monocytes Relative 7 %   Monocytes Absolute 0.4 0.1 - 1.0 K/uL   Eosinophils Relative 2 %   Eosinophils Absolute 0.1 0.0 - 0.5 K/uL   Basophils Relative 1 %   Basophils Absolute 0.0 0.0 - 0.1 K/uL   Immature Granulocytes 0 %   Abs Immature Granulocytes 0.02 0.00 - 0.07 K/uL    Comment: Performed at [facility], [address], [place], [place] [address]  Comprehensive metabolic panel     Status: Abnormal   Collection Time: 05/10/22 12:43 AM  Result Value Ref Range   Sodium 139 135 - 145 mmol/L   Potassium 4.1 3.5 - 5.1 mmol/L   Chloride 107 98 - 111 mmol/L   CO2 23 22 - 32 mmol/L   Glucose, Bld 125 (H) 70 - 99 mg/dL    Comment: Glucose reference range applies only to samples taken after fasting for at least 8 hours.   BUN 32 (H) 8 - 23 mg/dL   Creatinine, Ser 1.17 (H) 0.44 - 1.00 mg/dL   Calcium 9.1 8.9 - 10.3 mg/dL   Total Protein 7.0 6.5 - 8.1 g/dL   Albumin 3.9 3.5 - 5.0 g/dL   AST 22 15 - 41 U/L   ALT 15 0 - 44 U/L   Alkaline Phosphatase 60 38 - 126 U/L   Total Bilirubin 0.5 0.3 - 1.2 mg/dL   GFR, Estimated 46 (L) >60 mL/min    Comment: (NOTE) Calculated using the CKD-EPI Creatinine Equation (2021)    Anion gap 9 5 - 15     Comment: Performed at [facility] [address], [place], [place] [address]  Magnesium     Status: None   Collection Time: 05/10/22 12:43 AM  Result Value Ref Range   Magnesium 2.2 1.7 - 2.4 mg/dL    Comment: Performed at [facility] [address] [place], [place] [address]  Brain natriuretic peptide     Status: Abnormal   Collection Time: 05/10/22 12:43 AM  Result Value Ref Range   B Natriuretic Peptide 271.6 (H) 0.0 - 100.0 pg/mL    Comment: Performed at [facility], [address] [place], [place] [address]  Troponin I (High Sensitivity)     Status: Abnormal   Collection Time: 05/10/22 12:43 AM  Result Value Ref Range   Troponin I (High Sensitivity) 105 (HH) <18 ng/L    Comment: CRITICAL RESULT CALLED TO, READ BACK BY AND VERIFIED WITH A,THOMPSON,RN. [id number] 05/10/22. L.PAIT (NOTE) Elevated high sensitivity troponin I (hsTnI) values and significant  changes across serial measurements may suggest ACS but many other  chronic and acute conditions are known to elevate hsTnI results.  Refer to the "Links" section for chest pain algorithms and additional  guidance. Performed at [facility], [place] [address], [place], [place] [address]   Troponin I (High Sensitivity)     Status: Abnormal   Collection Time: 05/10/22  2:36 AM  Result Value Ref Range   Troponin I (High Sensitivity) 114 (HH) <18 ng/L    Comment: CRITICAL VALUE NOTED. VALUE IS CONSISTENT WITH PREVIOUSLY REPORTED/CALLED VALUE (NOTE) Elevated high sensitivity troponin I (hsTnI) values and significant  changes across serial measurements may suggest ACS but many other  chronic and acute conditions are known to elevate hsTnI results.  Refer to the "Links" section for chest pain algorithms and additional  guidance. Performed at [facility], [place] [address] [place], [place] [address]   Ethanol     Status: None   Collection Time: 05/10/22  5:11 AM  Result Value Ref Range   Alcohol, Ethyl  (B) <10 <10 mg/dL    Comment: (NOTE) Lowest detectable limit for serum alcohol is 10 mg/dL.  For medical purposes only. Performed at [facility], [place] [address] [place], [place] [address]   Protime-INR     Status: Abnormal   Collection Time: 05/10/22  5:11 AM  Result Value Ref Range   Prothrombin Time 18.8 (H) 11.4 - 15.2 seconds   INR 1.6 (H) 0.8 - 1.2    Comment: (NOTE) INR goal varies based on device and disease states. Performed at [facility], [place] [address] [place], [place] [address]   APTT     Status: None   Collection Time: 05/10/22  5:11 AM  Result Value Ref Range   aPTT 30 24 - 36 seconds    Comment: Performed at [facility] [address] [place], [place] [address]  Lipid panel     Status: Abnormal   Collection Time: 05/10/22  5:11 AM  Result Value Ref Range   Cholesterol 268 (H) 0 - 200 mg/dL   Triglycerides 27 <150 mg/dL   HDL 87 >40 mg/dL   Total CHOL/HDL Ratio 3.1 RATIO   VLDL 5 0 - 40 mg/dL   LDL Cholesterol 176 (H) 0 - 99 mg/dL    Comment:        Total Cholesterol/HDL:CHD Risk Coronary Heart Disease Risk Table                     Men   Women  1/2 Average Risk   3.4   3.3  Average Risk       5.0   4.4  2 X Average Risk   9.6   7.1  3 X Average Risk  23.4   11.0        Use the calculated Patient Ratio above and the CHD Risk Table to determine the patient's CHD Risk.        ATP III CLASSIFICATION (LDL):  <100     mg/dL  Optimal  100-129  mg/dL   Near or Above                    Optimal  130-159  mg/dL   Borderline  601-093  mg/dL   High  >235     mg/dL   Very High Performed at [facility], [address], [place], [place] [address]   Hemoglobin A1c     Status: Abnormal   Collection Time: 05/10/22  5:11 AM  Result Value Ref Range   Hgb A1c MFr Bld 6.0 (H) 4.8 - 5.6 %    Comment: (NOTE)         Prediabetes: 5.7 - 6.4         Diabetes: >6.4         Glycemic control for adults with diabetes: <7.0    Mean Plasma  Glucose 126 mg/dL    Comment: (NOTE) Performed At: [facility] [address] [place], [place] [id number] Jolene Schimke MD BJ:[id number]   TSH     Status: None   Collection Time: 05/10/22  5:11 AM  Result Value Ref Range   TSH 0.529 0.350 - 4.500 uIU/mL    Comment: Performed by a 3rd Generation assay with a functional sensitivity of <=0.01 uIU/mL. Performed at [facility], [address] [place], [place] [address]   I-stat chem 8, ED     Status: Abnormal   Collection Time: 05/10/22  6:12 AM  Result Value Ref Range   Sodium 139 135 - 145 mmol/L   Potassium 4.2 3.5 - 5.1 mmol/L   Chloride 106 98 - 111 mmol/L   BUN 31 (H) 8 - 23 mg/dL   Creatinine, Ser 1.06 0.44 - 1.00 mg/dL   Glucose, Bld 269 (H) 70 - 99 mg/dL    Comment: Glucose reference range applies only to samples taken after fasting for at least 8 hours.   Calcium, Ion 1.17 1.15 - 1.40 mmol/L   TCO2 25 22 - 32 mmol/L   Hemoglobin 12.6 12.0 - 15.0 g/dL   HCT 48.5 46.2 - 70.3 %  Urine rapid drug screen (hosp performed)     Status: None   Collection Time: 05/10/22  7:31 AM  Result Value Ref Range   Opiates NONE DETECTED NONE DETECTED   Cocaine NONE DETECTED NONE DETECTED   Benzodiazepines NONE DETECTED NONE DETECTED   Amphetamines NONE DETECTED NONE DETECTED   Tetrahydrocannabinol NONE DETECTED NONE DETECTED   Barbiturates NONE DETECTED NONE DETECTED    Comment: (NOTE) DRUG SCREEN FOR MEDICAL PURPOSES ONLY.  IF CONFIRMATION IS NEEDED FOR ANY PURPOSE, NOTIFY LAB WITHIN 5 DAYS.  LOWEST DETECTABLE LIMITS FOR URINE DRUG SCREEN Drug Class                     Cutoff (ng/mL) Amphetamine and metabolites    1000 Barbiturate and metabolites    200 Benzodiazepine                 200 Opiates and metabolites        300 Cocaine and metabolites        300 THC                            50 Performed at [facility], [address] [place], [place] [address]   Urinalysis, Routine w reflex microscopic      Status: Abnormal   Collection  Time: 05/10/22  7:31 AM  Result Value Ref Range   Color, Urine YELLOW YELLOW   APPearance CLEAR CLEAR   Specific Gravity, Urine 1.015 1.005 - 1.030   pH 6.0 5.0 - 8.0   Glucose, UA NEGATIVE NEGATIVE mg/dL   Hgb urine dipstick NEGATIVE NEGATIVE   Bilirubin Urine NEGATIVE NEGATIVE   Ketones, ur NEGATIVE NEGATIVE mg/dL   Protein, ur 30 (A) NEGATIVE mg/dL   Nitrite NEGATIVE NEGATIVE   Leukocytes,Ua NEGATIVE NEGATIVE   RBC / HPF 0-5 0 - 5 RBC/hpf   WBC, UA 0-5 0 - 5 WBC/hpf   Bacteria, UA NONE SEEN NONE SEEN   Squamous Epithelial / LPF 0-5 0 - 5   Mucus PRESENT    Hyaline Casts, UA PRESENT     Comment: Performed at [facility], [address] [place], [place] [address]   MR BRAIN WO CONTRAST  Result Date: 05/10/2022 CLINICAL DATA:  Syncope with cerebrovascular cause suspected EXAM: MRI HEAD WITHOUT CONTRAST TECHNIQUE: Multiplanar, multiecho pulse sequences of the brain and surrounding structures were obtained without intravenous contrast. COMPARISON:  None Available. FINDINGS: Brain: Tiny foci of restricted diffusion scattered along the bilateral peripheral cerebral hemispheres, cortical to subcortical in location. Patchy involvement in the right occipital lobe and in the posterior right frontal white matter has a weak diffusion signal suggesting more subacute timing. Tiny chronic infarcts in the bilateral cerebral cortex. Chronic lacunar infarcts in the left deep cerebral white matter. Brain atrophy that is generalized. Vascular: Major flow voids are preserved Skull and upper cervical spine: Normal marrow signal. Sinuses/Orbits: Unremarkable. IMPRESSION: 1. Scattered tiny acute to subacute infarcts suggesting central embolic disease. 2. Brain atrophy and chronic small vessel ischemia with small chronic cerebellar infarcts bilaterally. Electronically Signed   By: Tiburcio Pea M.D.   On: 05/10/2022 04:13   DG Chest Portable 1 View  Result Date:  05/10/2022 CLINICAL DATA:  Near syncope EXAM: PORTABLE CHEST 1 VIEW COMPARISON:  04/12/2022 FINDINGS: Cardiomegaly. Aortic atherosclerosis. No confluent opacities, effusions or edema. No acute bony abnormality. IMPRESSION: Cardiomegaly.  No active disease. Electronically Signed   By: Charlett Nose M.D.   On: 05/10/2022 01:12    Pending Labs Unresulted Labs (From admission, onward)     Start     Ordered   05/12/22 0500  Basic metabolic panel  Daily at 5am,   R      05/11/22 1039   05/12/22 0500  Magnesium  Daily at 5am,   R      05/11/22 1039   05/11/22 1040  CBC  Every third day,   R      05/11/22 1039            Vitals/Pain Today's Vitals   05/11/22 0645 05/11/22 0724 05/11/22 1040 05/11/22 1125  BP:  (!) 165/64 (!) 166/120   Pulse:  (!) 48 (!) 50 (!) 52  Resp:  17 18 14   Temp: 98 F (36.7 C) 97.8 F (36.6 C)  97.9 F (36.6 C)  TempSrc: Oral Oral    SpO2:  98% 96% 97%  PainSc:  0-No pain      Isolation Precautions No active isolations  Medications Medications  labetalol (NORMODYNE) injection 5 mg (has no administration in time range)  atorvastatin (LIPITOR) tablet 80 mg (80 mg Oral Given 05/11/22 1044)  levothyroxine (SYNTHROID) tablet 112 mcg (112 mcg Oral Given 05/11/22 0730)  Rivaroxaban (XARELTO) tablet 15 mg (15 mg Oral Given 05/10/22 1723)  amiodarone (PACERONE) tablet 100 mg (  100 mg Oral Given 05/11/22 1045)  ipratropium-albuterol (DUONEB) 0.5-2.5 (3) MG/3ML nebulizer solution 3 mL (has no administration in time range)  oxyCODONE (Oxy IR/ROXICODONE) immediate release tablet 5 mg (has no administration in time range)  guaiFENesin (ROBITUSSIN) 100 MG/5ML liquid 5 mL (has no administration in time range)  senna-docusate (Senokot-S) tablet 1 tablet (has no administration in time range)  traZODone (DESYREL) tablet 50 mg (has no administration in time range)  acetaminophen (TYLENOL) tablet 650 mg (has no administration in time range)  ondansetron (ZOFRAN) injection 4 mg  (has no administration in time range)  ondansetron (ZOFRAN) injection 4 mg (4 mg Intravenous Given 05/10/22 0115)    Mobility walks with device Low fall risk   Focused Assessments    R Recommendations: See Admitting Provider Note  Report given to:   Additional Notes: [place]

## 2022-05-11 NOTE — PMR Pre-admission (Signed)
PMR Admission Coordinator Pre-Admission Assessment  Patient: Rebekah Pope is an 81 y.o., female MRN: [id number] DOB: 10-21-1940 Height:   Weight:    Insurance Information HMO:     PPO: yes     PCP:      IPA:      80/20:      OTHER:  PRIMARY:  [employer]     Policy#: [id number]      Subscriber: Pt  CM Name: Jeanella Craze       Phone#: [phone]     Fax#: [phone] Pre-Cert#: [id number]      [employer] called with authorization for admit 12/5 for 7 days with update due 12/11 Benefits:  Phone #:      Name:  Eff Date: 06/09/2019- still active  Deductible: does not have  OOP Max: $4,000 ($1,433.61 met)  CIR: $160/day co-pay with a max co-pay of $1,600/admission (10 days)  SNF: $0/day co-pay for days 1-20, $50/day co-pay for days 21-100, limited to 100 days/cal yr   Outpatient:  $20/visit co-pay  Home Health:  100% coverage  DME: 80% coverage; 20% co-insurance  Providers: in network  SECONDARY:       Policy#:      Phone#:   Development worker, community:       Phone#:   The Engineer, petroleum" for patients in Inpatient Rehabilitation Facilities with attached "Privacy Act [place] Records" was provided and verbally reviewed with: Patient  Emergency Contact Information Contact Information     Name Relation Home Work Mobile   [place] Spouse Sultana Daughter   [phone]   [place], [place]   [phone]       Current Medical History  Patient Admitting Diagnosis: CVA History of Present Illness:  HPI: Rebekah Pope is a 81 y.o. 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   Last known well around 1 PM.  Was sitting at her table and paying her bills.  No slurred speech or focal motor neurological deficits.  Associated with lightheadedness and profound nausea and vomiting x 3.  Her double vision persists in the ED, improves when she closes one eye.MRI brain with scattered tiny acute to subacute infarcts suggesting central embolic disease. Pt. With some bradycardia during admission, plans to see her cardiologist outpatient for potential pacemaker placement. Echo showed eft ventricular ejection fraction, by estimation, is 65 to 70% and right ventricular systolic function that is normal.  Pt seen by PT/OT who recommend CIR to assist return to PLOF.    Complete NIHSS TOTAL: 0  Patient's medical record from [facility] has been reviewed by the rehabilitation admission coordinator and physician.  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 Burt Knack & Dr. Cyndia Bent   Severe aortic stenosis     Has the patient had major surgery during 100 days prior to admission? No  Family History   family history includes Dementia in her mother; Heart disease in her father; Hip fracture in her mother; Pneumonia in her mother.  Current Medications  Current Facility-Administered Medications:    acetaminophen (TYLENOL) tablet 650 mg, 650 mg, Oral, Q6H PRN, Amin, Ankit Chirag, MD   amiodarone (PACERONE) tablet 100 mg, 100 mg, Oral, Daily, Hall, Carole N, DO, 100 mg at 05/11/22 1045   atorvastatin (LIPITOR) tablet 80 mg, 80  mg, Oral, Daily, Hall, Carole N, DO, 80 mg at 05/11/22 1044   guaiFENesin (ROBITUSSIN) 100 MG/5ML liquid 5 mL, 5 mL, Oral, Q4H PRN, Amin, Ankit Chirag, MD   ipratropium-albuterol (DUONEB) 0.5-2.5 (3) MG/3ML nebulizer solution 3 mL, 3 mL, Nebulization, Q4H PRN, Amin, Ankit Chirag, MD   labetalol (NORMODYNE) injection 5 mg, 5 mg, Intravenous, Q2H PRN, Hall, Carole N, DO   levothyroxine (SYNTHROID) tablet 112 mcg, 112 mcg, Oral, QAC breakfast, Hall, Carole N, DO, 112 mcg at 05/11/22 0730   ondansetron (ZOFRAN) injection 4 mg, 4 mg, Intravenous, Q6H PRN, Amin, Ankit Chirag, MD   oxyCODONE (Oxy IR/ROXICODONE) immediate release tablet 5 mg, 5 mg, Oral, Q4H PRN, Amin, Ankit Chirag, MD   Rivaroxaban (XARELTO) tablet 15 mg, 15 mg, Oral, QAC supper, Hall, Carole N, DO, 15 mg at 05/10/22 1723   senna-docusate (Senokot-S) tablet 1 tablet, 1 tablet, Oral, QHS PRN, Amin, Ankit Chirag, MD   traZODone (DESYREL) tablet 50 mg, 50 mg, Oral, QHS PRN, Amin, Ankit Chirag, MD  Current Outpatient Medications:    amiodarone (PACERONE) 200 MG tablet, Take 0.5 tablets (100 mg total) by mouth daily., Disp: 45 tablet, Rfl: 3   amLODipine (NORVASC) 10 MG tablet, Take 1 tablet (10 mg total) by mouth at bedtime., Disp: 90 tablet, Rfl: 3   ENTRESTO 24-26 MG, Take 1 tablet by mouth 2 (two) times daily., Disp: 180 tablet, Rfl: 3   hydrALAZINE (APRESOLINE) 25 MG tablet, Take 1 tablet (25 mg total) by mouth 3 (three) times daily., Disp: 270 tablet, Rfl: 3   levothyroxine (SYNTHROID) 112 MCG tablet, Take 112 mcg by mouth daily before breakfast., Disp: , Rfl:    Rivaroxaban (XARELTO) 15 MG TABS tablet, Take 1 tablet (15 mg total) by mouth daily., Disp: 90 tablet, Rfl: 3   valsartan (DIOVAN) 40 MG tablet, Take 40 mg by mouth daily., Disp: , Rfl:    hydrALAZINE (APRESOLINE) 25 MG tablet, Take 1 tablet (25 mg total) by mouth every 4 (four) hours as needed (for systolic blood pressure 921 or greater). (Patient not taking: Reported on  05/10/2022), Disp: 45 tablet, Rfl: 1  Patients Current Diet:  Diet Order             Diet Heart Room service appropriate? Yes; Fluid consistency: Thin  Diet effective now                   Precautions / Restrictions Precautions Precautions: Fall Precaution Comments: diplopia, dysconjugate gaze Restrictions Weight Bearing Restrictions: No   Has the patient had 2 or more falls or a fall with injury in the past year? No  Prior Activity Level Community (5-7x/wk): active in the community PTA  Prior Functional Level Self Care: Did the patient need help bathing, dressing, using the toilet or eating? Independent  Indoor Mobility: Did the patient need assistance with walking from  room to room (with or without device)? Independent  Stairs: Did the patient need assistance with internal or external stairs (with or without device)? Independent  Functional Cognition: Did the patient need help planning regular tasks such as shopping or remembering to take medications? Independent  Patient Information Are you of Hispanic, Latino/a,or Spanish origin?: A. No, not of Hispanic, Latino/a, or Spanish origin What is your race?: A. White Do you need or want an interpreter to communicate with a doctor or health care staff?: 0. No  Patient's Response To:  Health Literacy and Transportation Is the patient able to respond to health literacy and transportation needs?: Yes Health Literacy - How often do you need to have someone help you when you read instructions, pamphlets, or other written material from your doctor or pharmacy?: Never In the past 12 months, has lack of transportation kept you from medical appointments or from getting medications?: No In the past 12 months, has lack of transportation kept you from meetings, work, or from getting things needed for daily living?: No  Home Assistive Devices / Equipment Home Equipment: Conservation officer, nature (2 wheels), [employer] - single point  Prior Device  Use: Indicate devices/aids used by the patient prior to current illness, exacerbation or injury? Walker  Current Functional Level Cognition  Overall Cognitive Status: Within Functional Limits for tasks assessed Orientation Level: Oriented X4 General Comments: overall WFL for basic tasks assessed and orientation    Extremity Assessment (includes Sensation/Coordination)  Upper Extremity Assessment: Defer to OT evaluation  Lower Extremity Assessment: Generalized weakness    ADLs  Overall ADL's : Needs assistance/impaired Eating/Feeding: Independent, Sitting Grooming: Set up, Sitting Upper Body Bathing: Set up, Sitting Lower Body Bathing: Maximal assistance, Sit to/from stand Upper Body Dressing : Set up, Sitting Lower Body Dressing: Maximal assistance, Sit to/from stand Toilet Transfer: Moderate assistance, Stand-pivot, BSC/3in1 Toileting- Clothing Manipulation and Hygiene: Min guard, Sitting/lateral lean Functional mobility during ADLs: Moderate assistance General ADL Comments: Limited by DV, nausea, poor balance, generalized weakness. ED eval without AD. mod A for safety and side stepping/pivoting only    Mobility  Overal bed mobility: Needs Assistance Bed Mobility: Supine to Sit, Sit to Supine Supine to sit: Min guard Sit to supine: Min guard General bed mobility comments: with eyes closed due to dizziness and nausea    Transfers  Overall transfer level: Needs assistance Equipment used: Rolling walker (2 wheels) Transfers: Sit to/from Stand Sit to Stand: Min assist, From elevated surface General transfer comment: mod A for balance and pt fear of falling    Ambulation / Gait / Stairs / Wheelchair Mobility  Ambulation/Gait Ambulation/Gait assistance: Counsellor (Feet): 15 Feet Assistive device: Rolling walker (2 wheels) Gait Pattern/deviations: Step-to pattern General Gait Details: slowed step-to gait, minimal foot clearance. PT providing cues for gaze  stabilization when mobilizing Gait velocity: reduced Gait velocity interpretation: <1.31 ft/sec, indicative of household ambulator    Posture / Balance Dynamic Sitting Balance Sitting balance - Comments: wtih 1 eye closed Balance Overall balance assessment: Needs assistance Sitting-balance support: No upper extremity supported, Feet supported Sitting balance-Leahy Scale: Fair Sitting balance - Comments: wtih 1 eye closed Standing balance support: Bilateral upper extremity supported, Reliant on assistive device for balance Standing balance-Leahy Scale: Poor    Special needs/care consideration Skin intact  and Special service needs none   Previous Home Environment (from acute therapy documentation) Living Arrangements: Spouse/significant other  Lives With: Spouse Available Help at Discharge: Family, Available 24 hours/day Type of Home: House  Home Layout: One level, Laundry or work area in basement (stair lift to basement) Home Access: Stairs to enter Technical brewer of Steps: 1 Bathroom Shower/Tub: Chiropodist: Handicapped height Bathroom Accessibility: Yes How Accessible: Accessible via walker Additional Comments: live wtih husband, son is 2 miles away  Discharge Living Setting Plans for Discharge Living Setting: Patient's home Type of Home at Discharge: House Discharge Home Layout: One level, Laundry or work area in basement Discharge Home Access: Stairs to enter [place]: None Technical brewer of Steps: 1 Discharge Bathroom Shower/Tub: [place] unit Discharge Bathroom Toilet: Handicapped height Discharge Bathroom Accessibility: Yes How Accessible: Accessible via walker  [place] Patient Roles: Spouse Contact Information: [phone] Anticipated Caregiver: Mykell Genao Anticipated Caregiver's Contact Information: [phone] Ability/Limitations of Caregiver: Can do light assist Caregiver  Availability: 24/7 Discharge Plan Discussed with Primary Caregiver: Yes Is Caregiver In Agreement with Plan?: Yes Does Caregiver/Family have Issues with Lodging/Transportation while Pt is in Rehab?: No  Goals Patient/Family Goal for Rehab: PT/OT Supervision Expected length of stay: 5-7 days Pt/Family Agrees to Admission and willing to participate: Yes Program Orientation Provided & Reviewed with Pt/Caregiver Including Roles  & Responsibilities: Yes  Decrease burden of Care through IP rehab admission: none  Possible need for SNF placement upon discharge: not anticipated  Patient Condition: I have reviewed medical records from [facility] , spoken with CM, and patient and spouse. I met with patient at the bedside for inpatient rehabilitation assessment.  Patient will benefit from ongoing PT and OT, can actively participate in 3 hours of therapy a day 5 days of the week, and can make measurable gains during the admission.  Patient will also benefit from the coordinated team approach during an Inpatient Acute Rehabilitation admission.  The patient will receive intensive therapy as well as Rehabilitation physician, nursing, social worker, and care management interventions.  Due to safety, skin/wound care, disease management, medication administration, pain management, and patient education the patient requires 24 hour a day rehabilitation nursing.  The patient is currently min A-min G with mobility and basic ADLs.  Discharge setting and therapy post discharge at home with home health is anticipated.  Patient has agreed to participate in the Acute Inpatient Rehabilitation Program and will admit today.  Preadmission Screen Completed By:  Genella Mech, 05/11/2022 2:56 PM ______________________________________________________________________   Discussed status with Dr. Naaman Plummer  on 05/12/22 at 9.30 and received approval for admission today.  Admission Coordinator:  Genella Mech, CCC-SLP,  time 1130/Date 05/12/22   Assessment/Plan: Diagnosis:Scattered bi-cerebral infarcts Does the need for close, 24 hr/day Medical supervision in concert with the patient's rehab needs make it unreasonable for this patient to be served in a less intensive setting? Yes Co-Morbidities requiring supervision/potential complications: SSS with PPM, PAF, htn,  Due to bladder management, bowel management, safety, skin/wound care, disease management, medication administration, pain management, and patient education, does the patient require 24 hr/day rehab nursing? Yes Does the patient require coordinated care of a physician, rehab nurse, PT, OT to address physical and functional deficits in the context of the above medical diagnosis(es)? Yes Addressing deficits in the following areas: balance, endurance, locomotion, strength, transferring, bowel/bladder control, bathing, dressing, feeding, grooming, toileting, and psychosocial support Can the patient actively participate in an intensive therapy program of at least 3 hrs of therapy 5 days a week? Yes The potential for patient to make measurable gains while on inpatient rehab is excellent Anticipated functional outcomes upon discharge from inpatient rehab: supervision PT,  supervision OT, n/a SLP Estimated rehab length of stay to reach the above functional goals is: 5-7 days Anticipated discharge destination: Home 10. Overall Rehab/Functional Prognosis: excellent   MD Signature: Meredith Staggers, MD, [place] Director Rehabilitation Services 05/12/2022

## 2022-05-12 ENCOUNTER — Other Ambulatory Visit: Payer: Self-pay

## 2022-05-12 ENCOUNTER — Encounter (HOSPITAL_COMMUNITY): Payer: Self-pay | Admitting: Physical Medicine & Rehabilitation

## 2022-05-12 ENCOUNTER — Inpatient Hospital Stay (HOSPITAL_COMMUNITY)
Admission: RE | Admit: 2022-05-12 | Discharge: 2022-05-21 | DRG: 092 | Disposition: A | Discharge status: Home Health | Payer: Medicare PPO | Source: Intra-hospital | Attending: Physical Medicine & Rehabilitation | Admitting: Physical Medicine & Rehabilitation

## 2022-05-12 DIAGNOSIS — Z79890 Hormone replacement therapy (postmenopausal): Secondary | ICD-10-CM | POA: Diagnosis not present

## 2022-05-12 DIAGNOSIS — R2689 Other abnormalities of gait and mobility: Principal | ICD-10-CM | POA: Diagnosis present

## 2022-05-12 DIAGNOSIS — E1122 Type 2 diabetes mellitus with diabetic chronic kidney disease: Secondary | ICD-10-CM | POA: Diagnosis present

## 2022-05-12 DIAGNOSIS — I2489 Other forms of acute ischemic heart disease: Secondary | ICD-10-CM | POA: Diagnosis present

## 2022-05-12 DIAGNOSIS — J309 Allergic rhinitis, unspecified: Secondary | ICD-10-CM | POA: Diagnosis present

## 2022-05-12 DIAGNOSIS — E039 Hypothyroidism, unspecified: Secondary | ICD-10-CM | POA: Diagnosis present

## 2022-05-12 DIAGNOSIS — I69398 Other sequelae of cerebral infarction: Secondary | ICD-10-CM | POA: Diagnosis not present

## 2022-05-12 DIAGNOSIS — I2510 Atherosclerotic heart disease of native coronary artery without angina pectoris: Secondary | ICD-10-CM | POA: Diagnosis present

## 2022-05-12 DIAGNOSIS — Z953 Presence of xenogenic heart valve: Secondary | ICD-10-CM

## 2022-05-12 DIAGNOSIS — R110 Nausea: Secondary | ICD-10-CM | POA: Diagnosis not present

## 2022-05-12 DIAGNOSIS — I447 Left bundle-branch block, unspecified: Secondary | ICD-10-CM | POA: Diagnosis present

## 2022-05-12 DIAGNOSIS — N1832 Chronic kidney disease, stage 3b: Secondary | ICD-10-CM | POA: Diagnosis present

## 2022-05-12 DIAGNOSIS — I480 Paroxysmal atrial fibrillation: Secondary | ICD-10-CM | POA: Diagnosis present

## 2022-05-12 DIAGNOSIS — Z950 Presence of cardiac pacemaker: Secondary | ICD-10-CM

## 2022-05-12 DIAGNOSIS — H532 Diplopia: Secondary | ICD-10-CM | POA: Diagnosis present

## 2022-05-12 DIAGNOSIS — I5032 Chronic diastolic (congestive) heart failure: Secondary | ICD-10-CM | POA: Diagnosis present

## 2022-05-12 DIAGNOSIS — E785 Hyperlipidemia, unspecified: Secondary | ICD-10-CM | POA: Diagnosis present

## 2022-05-12 DIAGNOSIS — I130 Hypertensive heart and chronic kidney disease with heart failure and stage 1 through stage 4 chronic kidney disease, or unspecified chronic kidney disease: Secondary | ICD-10-CM | POA: Diagnosis present

## 2022-05-12 DIAGNOSIS — I639 Cerebral infarction, unspecified: Secondary | ICD-10-CM | POA: Diagnosis present

## 2022-05-12 DIAGNOSIS — Z7901 Long term (current) use of anticoagulants: Secondary | ICD-10-CM

## 2022-05-12 DIAGNOSIS — R001 Bradycardia, unspecified: Secondary | ICD-10-CM | POA: Diagnosis not present

## 2022-05-12 DIAGNOSIS — K5900 Constipation, unspecified: Secondary | ICD-10-CM | POA: Diagnosis present

## 2022-05-12 DIAGNOSIS — Z8249 Family history of ischemic heart disease and other diseases of the circulatory system: Secondary | ICD-10-CM

## 2022-05-12 DIAGNOSIS — Z79899 Other long term (current) drug therapy: Secondary | ICD-10-CM

## 2022-05-12 DIAGNOSIS — I495 Sick sinus syndrome: Secondary | ICD-10-CM | POA: Diagnosis present

## 2022-05-12 DIAGNOSIS — R7989 Other specified abnormal findings of blood chemistry: Secondary | ICD-10-CM | POA: Diagnosis not present

## 2022-05-12 DIAGNOSIS — L89156 Pressure-induced deep tissue damage of sacral region: Secondary | ICD-10-CM | POA: Diagnosis present

## 2022-05-12 HISTORY — DX: Left bundle-branch block, unspecified: I44.7

## 2022-05-12 HISTORY — DX: Atherosclerotic heart disease of native coronary artery without angina pectoris: I25.10

## 2022-05-12 HISTORY — DX: Cerebral infarction, unspecified: I63.9

## 2022-05-12 HISTORY — DX: Chronic diastolic (congestive) heart failure: I50.32

## 2022-05-12 HISTORY — DX: Chronic kidney disease, stage 3b: N18.32

## 2022-05-12 HISTORY — DX: Bradycardia, unspecified: R00.1

## 2022-05-12 LAB — BASIC METABOLIC PANEL
Anion gap: 8 (ref 5–15)
BUN: 29 mg/dL — ABNORMAL HIGH (ref 8–23)
CO2: 24 mmol/L (ref 22–32)
Calcium: 9 mg/dL (ref 8.9–10.3)
Chloride: 106 mmol/L (ref 98–111)
Creatinine, Ser: 1.27 mg/dL — ABNORMAL HIGH (ref 0.44–1.00)
GFR, Estimated: 42 mL/min — ABNORMAL LOW (ref >60)
Glucose, Bld: 104 mg/dL — ABNORMAL HIGH (ref 70–99)
Potassium: 4.1 mmol/L (ref 3.5–5.1)
Sodium: 138 mmol/L (ref 135–145)

## 2022-05-12 LAB — MAGNESIUM: Magnesium: 2 mg/dL (ref 1.7–2.4)

## 2022-05-12 MED ORDER — IPRATROPIUM-ALBUTEROL 0.5-2.5 (3) MG/3ML IN SOLN: 3.0000 mL | RESPIRATORY_TRACT | Status: DC | PRN

## 2022-05-12 MED ORDER — APIXABAN 5 MG PO TABS: 5.0000 mg | ORAL_TABLET | Freq: Two times a day (BID) | ORAL | Status: DC

## 2022-05-12 MED ORDER — SENNOSIDES-DOCUSATE SODIUM 8.6-50 MG PO TABS: 1.0000 | ORAL_TABLET | Freq: Every evening | ORAL | Status: DC | PRN

## 2022-05-12 MED ORDER — APIXABAN 5 MG PO TABS
5.0000 mg | ORAL_TABLET | Freq: Two times a day (BID) | ORAL | Status: DC
  Administered 2022-05-12 – 2022-05-21 (×18): 5 mg via ORAL
  Filled 2022-05-12 (×18): qty 1

## 2022-05-12 MED ORDER — ATORVASTATIN CALCIUM 80 MG PO TABS
80.0000 mg | ORAL_TABLET | Freq: Every day | ORAL | Status: DC
  Administered 2022-05-13 – 2022-05-21 (×9): 80 mg via ORAL
  Filled 2022-05-12 (×9): qty 1

## 2022-05-12 MED ORDER — LEVOTHYROXINE SODIUM 112 MCG PO TABS
112.0000 ug | ORAL_TABLET | Freq: Every day | ORAL | Status: DC
  Administered 2022-05-13 – 2022-05-21 (×9): 112 ug via ORAL
  Filled 2022-05-12 (×9): qty 1

## 2022-05-12 MED ORDER — AMIODARONE HCL 200 MG PO TABS
100.0000 mg | ORAL_TABLET | Freq: Every day | ORAL | Status: DC
  Administered 2022-05-13 – 2022-05-21 (×9): 100 mg via ORAL
  Filled 2022-05-12 (×9): qty 1

## 2022-05-12 MED ORDER — ATORVASTATIN CALCIUM 80 MG PO TABS: 80.0000 mg | ORAL_TABLET | Freq: Every day | ORAL | Status: DC

## 2022-05-12 MED ORDER — OXYCODONE HCL 5 MG PO TABS: 5.0000 mg | ORAL_TABLET | ORAL | Status: DC | PRN

## 2022-05-12 MED ORDER — ACETAMINOPHEN 325 MG PO TABS: 650.0000 mg | ORAL_TABLET | Freq: Four times a day (QID) | ORAL | Status: DC | PRN

## 2022-05-12 MED ORDER — GUAIFENESIN 100 MG/5ML PO LIQD: 5.0000 mL | ORAL | Status: DC | PRN

## 2022-05-12 MED ORDER — TRAZODONE HCL 50 MG PO TABS: 50.0000 mg | ORAL_TABLET | Freq: Every evening | ORAL | Status: DC | PRN

## 2022-05-12 NOTE — Plan of Care (Signed)
  Problem: RH BOWEL ELIMINATION Goal: RH STG MANAGE BOWEL WITH ASSISTANCE Description: STG Manage Bowel with mod I  Assistance. Outcome: Not Progressing; constipation per chart LBM 12/1   Problem: RH BLADDER ELIMINATION Goal: RH STG MANAGE BLADDER WITH ASSISTANCE Description: STG Manage Bladder With toileting Assistance Outcome: Not Progressing; incontinence ; uses purewick on acute

## 2022-05-12 NOTE — Progress Notes (Signed)
ANTICOAGULATION CONSULT NOTE - Initial Consult  Pharmacy Consult for switch xarelto to apixban Indication: atrial fibrillation  No Known Allergies  Patient Measurements: Height: 5\' 3"  (160 cm) Weight: 84.5 kg (186 lb 4.6 oz) IBW/kg (Calculated) : 52.4  Vital Signs: Temp: 97.9 F (36.6 C) (12/05 1120) Temp Source: Oral (12/05 1120) BP: 153/54 (12/05 1120) Pulse Rate: 48 (12/05 1120)  Labs: Recent Labs    05/10/22 0043 05/10/22 0236 05/10/22 0511 05/10/22 0612 05/12/22 0621  HGB 13.3  --   --  12.6  --   HCT 40.4  --   --  37.0  --   PLT 189  --   --   --   --   APTT  --   --  30  --   --   LABPROT  --   --  18.8*  --   --   INR  --   --  1.6*  --   --   CREATININE 1.17*  --   --  1.00 1.27*  TROPONINIHS 105* 114*  --   --   --     Estimated Creatinine Clearance: 34.5 mL/min (A) (by C-G formula based on SCr of 1.27 mg/dL (H)).   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 04/12/2022 & Dr. Excell Seltzer   Severe aortic stenosis     Medications:  Medications Prior to Admission  Medication Sig Dispense Refill Last Dose   amiodarone (PACERONE) 200 MG tablet Take 0.5 tablets (100 mg total) by mouth daily. 45 tablet 3 05/09/2022   amLODipine (NORVASC) 10 MG tablet Take 1 tablet (10 mg total) by mouth at bedtime. 90 tablet 3 05/09/2022   ENTRESTO 24-26 MG Take 1 tablet by mouth 2 (two) times daily. 180 tablet 3 05/10/2022   hydrALAZINE (APRESOLINE) 25 MG tablet Take 1 tablet (25 mg total) by mouth 3 (three) times daily. 270 tablet 3  05/10/2022   levothyroxine (SYNTHROID) 112 MCG tablet Take 112 mcg by mouth daily before breakfast.   05/10/2022   Rivaroxaban (XARELTO) 15 MG TABS tablet Take 1 tablet (15 mg total) by mouth daily. 90 tablet 3 05/10/2022 at 0800   valsartan (DIOVAN) 40 MG tablet Take 40 mg by mouth daily.   05/10/2022   hydrALAZINE (APRESOLINE) 25 MG tablet Take 1 tablet (25 mg total) by mouth every 4 (four) hours as needed (for systolic blood pressure 170 or greater). (Patient not taking: Reported on 05/10/2022) 45 tablet 1 Not Taking    Assessment: 8 yof admitted with AF on failed Xarelto PTA (admitted with functional deficit 2/2 multiple punctate to small acute ischemic infarctions b/l cerebral hemisphere likely 2/2 small vessel disease).    Goal of Therapy:  Monitor platelets by anticoagulation protocol: Yes   Plan:  DC xarelto (last dose 05/11/2022 at 16:32) Start apixaban 5 mg po bid at 1600 on 05/12/2022 Orders placed in signed and held to address her transfer to CIR later today.  Donovyn Guidice BS, PharmD,  BCPS Clinical Pharmacist 05/12/2022 12:41 PM  Contact: [phone] after 3 PM  "Be curious, not judgmental..." -Debbora Dus

## 2022-05-12 NOTE — H&P (Signed)
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  Per chart review patient lives with spouse.  1 level home one-step to entry.  Intermittent use of rolling walker since TAVR procedure.  Presented 05/10/2022 with sudden onset of nausea, dizziness/double vision and weakness with emesis.  MRI showed scattered tiny acute to subacute infarcts suggesting central embolic disease.  Admission chemistries unremarkable except BUN 32 creatinine 1.17, BNP 271, troponin 105-114.  Echo with ejection fraction of 65 to 70% no wall motion abnormality grade 2 diastolic dysfunction.  Neurology follow-up patient remains on Xarelto as prior to admission.  Elevated troponin felt to be related to demand ischemia. Plan now is for pacemaker placement as outpatient. Tolerating a regular consistency diet. Pt with diplopia and depth perception/balance deficits. Therapy evaluations completed due to patient decreased functional mobility was admitted for a comprehensive rehab program.  Review of Systems  Constitutional:  Negative for chills and fever.  HENT:  Negative for hearing loss.   Eyes:  Positive for double vision. Negative for blurred vision.  Respiratory:  Negative for cough, shortness of breath and wheezing.   Cardiovascular:  Positive for palpitations. Negative for chest pain and leg swelling.  Gastrointestinal:  Positive for constipation, nausea and vomiting.  Genitourinary:  Negative for dysuria, flank pain and hematuria.  Musculoskeletal:  Positive for joint pain and myalgias.  Skin:  Negative for rash.  Neurological:  Positive for  dizziness and weakness.  All other systems reviewed and are negative.  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 Excell Seltzer & Dr. Laneta Simmers   Severe aortic stenosis    Past Surgical History:  Procedure Laterality Date   CESAREAN SECTION     INTRAOPERATIVE TRANSTHORACIC ECHOCARDIOGRAM N/A 02/10/2022   Procedure: INTRAOPERATIVE TRANSTHORACIC ECHOCARDIOGRAM;  Surgeon: Tonny Bollman, MD;  Location: [facility] INVASIVE CV LAB;  Service: Open Heart Surgery;  Laterality: N/A;   RIGHT HEART CATH AND CORONARY ANGIOGRAPHY N/A 10/31/2021   Procedure: RIGHT HEART CATH AND CORONARY ANGIOGRAPHY;  Surgeon: Kathleene Hazel, MD;  Location: [facility] INVASIVE CV LAB;  Service: Cardiovascular;  Laterality: N/A;   TRANSCATHETER AORTIC VALVE REPLACEMENT, TRANSFEMORAL N/A 02/10/2022   Procedure: Transcatheter Aortic Valve Replacement, Transfemoral;  Surgeon: Tonny Bollman, MD;  Location: [facility] INVASIVE CV LAB;  Service: Open Heart Surgery;  Laterality: N/A;   Family History  Problem Relation Age of Onset   Dementia Mother    Pneumonia Mother    Hip fracture Mother    Heart disease Father    Social History:  reports that she has never smoked. She has been exposed to tobacco smoke. She has never used smokeless tobacco. She reports that  she does not drink alcohol and does not use drugs. Allergies: No Known Allergies Medications Prior to Admission  Medication Sig Dispense Refill   amiodarone (PACERONE) 200 MG tablet Take 0.5 tablets (100 mg total) by mouth daily. 45 tablet 3   amLODipine  (NORVASC) 10 MG tablet Take 1 tablet (10 mg total) by mouth at bedtime. 90 tablet 3   ENTRESTO 24-26 MG Take 1 tablet by mouth 2 (two) times daily. 180 tablet 3   hydrALAZINE (APRESOLINE) 25 MG tablet Take 1 tablet (25 mg total) by mouth 3 (three) times daily. 270 tablet 3   levothyroxine (SYNTHROID) 112 MCG tablet Take 112 mcg by mouth daily before breakfast.     Rivaroxaban (XARELTO) 15 MG TABS tablet Take 1 tablet (15 mg total) by mouth daily. 90 tablet 3   valsartan (DIOVAN) 40 MG tablet Take 40 mg by mouth daily.     hydrALAZINE (APRESOLINE) 25 MG tablet Take 1 tablet (25 mg total) by mouth every 4 (four) hours as needed (for systolic blood pressure 170 or greater). (Patient not taking: Reported on 05/10/2022) 45 tablet 1      Home: Home Living Family/patient expects to be discharged to:: Private residence Living Arrangements: Spouse/significant other Available Help at Discharge: Family, Available 24 hours/day Type of Home: House Home Access: Stairs to enter [employer] of Steps: 1 Home Layout: One level, Laundry or work area in basement (stair lift to basement) Bathroom Shower/Tub: Engineer, manufacturing systems: Handicapped height Bathroom Accessibility: Yes Home Equipment: Agricultural consultant (2 wheels), [employer] - single point Additional Comments: live wtih husband, son is 2 miles away  Lives With: Spouse   Functional History: Prior Function Prior Level of Function : Independent/Modified Independent Mobility Comments: intermittent use of RW since recent TAVR procedure ADLs Comments: indep. RN 1x/wk to manage medication and assess vitals  Functional Status:  Mobility: Bed Mobility Overal bed mobility: Needs Assistance Bed Mobility: Supine to Sit Supine to sit: Min guard Sit to supine: Min guard General bed mobility comments: mild c/o dizziness upon coming to sit Transfers Overall transfer level: Needs assistance Equipment used: Rolling walker (2  wheels) Transfers: Sit to/from Stand Sit to Stand: Min assist, Mod assist General transfer comment: mod a down to min a to power up, cues for hand placement Ambulation/Gait Ambulation/Gait assistance: Min guard, Min assist (assist for cues for gaze stabilization) Gait Distance (Feet): 75 Feet Assistive device: Rolling walker (2 wheels) Gait Pattern/deviations: Step-through pattern, Decreased stride length General Gait Details: slowed gait, cues for RW proximity progressign from step to to step through Gait velocity: decr Gait velocity interpretation: <1.31 ft/sec, indicative of household ambulator    ADL: ADL Overall ADL's : Needs assistance/impaired Eating/Feeding: Independent, Sitting Grooming: Set up, Sitting Upper Body Bathing: Set up, Sitting Lower Body Bathing: Maximal assistance, Sit to/from stand Upper Body Dressing : Set up, Sitting Lower Body Dressing: Maximal assistance, Sit to/from stand Toilet Transfer: Moderate assistance, Stand-pivot, BSC/3in1 Toileting- Clothing Manipulation and Hygiene: Min guard, Sitting/lateral lean Functional mobility during ADLs: Min guard, Rolling walker (2 wheels) General ADL Comments: session focused on vision and occlusion strategies  Cognition: Cognition Overall Cognitive Status: Within Functional Limits for tasks assessed Orientation Level: Oriented X4 Cognition Arousal/Alertness: Awake/alert Behavior During Therapy: WFL for tasks assessed/performed Overall Cognitive Status: Within Functional Limits for tasks assessed General Comments: overall WFL for basic tasks assessed and orientation  Physical Exam: Blood pressure (!) 168/69, pulse (!) 45, temperature 97.9 F (36.6 C), temperature source Oral, resp. rate 14,  height 5\' 3"  (1.6 m), weight 84.5 kg, SpO2 96 %. Physical Exam Constitutional:      Appearance: Normal appearance.  HENT:     Head: Normocephalic and atraumatic.     Right Ear: External ear normal.     Nose: Nose  normal.     Mouth/Throat:     Mouth: Mucous membranes are moist.  Eyes:     Extraocular Movements: Extraocular movements intact.     Conjunctiva/sclera: Conjunctivae normal.     Pupils: Pupils are equal, round, and reactive to light.  Cardiovascular:     Rate and Rhythm: Regular rhythm. Bradycardia present.     Heart sounds: No murmur heard.    No gallop.  Pulmonary:     Effort: Pulmonary effort is normal. No respiratory distress.     Breath sounds: No wheezing.  Abdominal:     General: Bowel sounds are normal. There is no distension.     Palpations: Abdomen is soft.     Tenderness: There is no abdominal tenderness.  Musculoskeletal:        General: No swelling or tenderness. Normal range of motion.     Cervical back: Normal range of motion.  Skin:    General: Skin is warm and dry.  Neurological:     Mental Status: She is alert.     Comments: Alert and oriented x 3. Normal insight and awareness. Intact Memory. Normal language and speech. Cranial nerve exam unremarkable except for lagging CN III/IV on left. Does have problems with depth perception and diplopia particularly with vertical gaze and gaze to right. Motor 5/5 UE and LE's. Sensory exam normal for light touch and pain in all 4 limbs. No limb ataxia or cerebellar signs. No abnormal tone appreciated.        Psychiatric:        Mood and Affect: Mood normal.        Behavior: Behavior normal.     Results for orders placed or performed during the hospital encounter of 05/10/22 (from the past 48 hour(s))  Basic metabolic panel     Status: Abnormal   Collection Time: 05/12/22  6:21 AM  Result Value Ref Range   Sodium 138 135 - 145 mmol/L   Potassium 4.1 3.5 - 5.1 mmol/L   Chloride 106 98 - 111 mmol/L   CO2 24 22 - 32 mmol/L   Glucose, Bld 104 (H) 70 - 99 mg/dL    Comment: Glucose reference range applies only to samples taken after fasting for at least 8 hours.   BUN 29 (H) 8 - 23 mg/dL   Creatinine, Ser 14/05/23 (H) 0.44 -  1.00 mg/dL   Calcium 9.0 8.9 - 2.97 mg/dL   GFR, Estimated 42 (L) >60 mL/min    Comment: (NOTE) Calculated using the CKD-EPI Creatinine Equation (2021)    Anion gap 8 5 - 15    Comment: Performed at [facility], [address] [place], [place]  Magnesium     Status: None   Collection Time: 05/12/22  6:21 AM  Result Value Ref Range   Magnesium 2.0 1.7 - 2.4 mg/dL    Comment: Performed at [facility], [address], [place], [place]   ECHOCARDIOGRAM COMPLETE BUBBLE STUDY  Result Date: 05/11/2022    ECHOCARDIOGRAM REPORT   Patient Name:   OMOLOLA MITTMAN Date of Exam: 05/11/2022 Medical Rec #:  14/[id number]            Height:  63.0 in Accession #:    [id number]           Weight:       187.0 lb Date of Birth:  Nov 02, 1940             BSA:          1.879 m Patient Age:    81 years             BP:           165/64 mmHg Patient Gender: F                    HR:           48 bpm. Exam Location:  Inpatient Procedure: 2D Echo, Color Doppler and Cardiac Doppler Indications:    Stroke i63.9  History:        Patient has prior history of Echocardiogram examinations, most                 recent 03/11/2022. Arrythmias:Atrial Fibrillation; Risk                 Factors:Dyslipidemia. 26mm Medtronic TAVR Placed 02/10/22.  Sonographer:    Irving Burton Senior RDCS Referring Phys: [id number] Oliver Pila HALL  Sonographer Comments: No IV access for bubble study. IMPRESSIONS  1. Left ventricular ejection fraction, by estimation, is 65 to 70%. The left ventricle has normal function. The left ventricle has no regional wall motion abnormalities. There is mild left ventricular hypertrophy. Left ventricular diastolic parameters are consistent with Grade II diastolic dysfunction (pseudonormalization). Elevated left atrial pressure.  2. Right ventricular systolic function is normal. The right ventricular size is normal. There is normal pulmonary artery systolic pressure.  3. Left atrial size was moderately  dilated.  4. Right atrial size was moderately dilated.  5. MV is thickened with mildy resricted motoin. Mean gradient through the valve is 3.5 cm2. (HR 49 bpm) Unchanged from echo on 02/10/22.. Mild mitral valve regurgitation. Moderate to severe mitral annular calcification.  6. S/p TAVR (26 mm Medtronic CoreValve-Evolut Pro prothesis, procedure date 02/10/22) Mean gradient through the valve is 8 mm Hg. No significant change from echo done 02/10/22.. The aortic valve has been repaired/replaced. Aortic valve regurgitation is not visualized.  7. The inferior vena cava is normal in size with greater than 50% respiratory variability, suggesting right atrial pressure of 3 mmHg. FINDINGS  Left Ventricle: Left ventricular ejection fraction, by estimation, is 65 to 70%. The left ventricle has normal function. The left ventricle has no regional wall motion abnormalities. The left ventricular internal cavity size was normal in size. There is  mild left ventricular hypertrophy. Left ventricular diastolic parameters are consistent with Grade II diastolic dysfunction (pseudonormalization). Elevated left atrial pressure. Right Ventricle: The right ventricular size is normal. Right vetricular wall thickness was not assessed. Right ventricular systolic function is normal. There is normal pulmonary artery systolic pressure. The tricuspid regurgitant velocity is 2.67 m/s, and with an assumed right atrial pressure of 3 mmHg, the estimated right ventricular systolic pressure is 31.5 mmHg. Left Atrium: Left atrial size was moderately dilated. Right Atrium: Right atrial size was moderately dilated. Pericardium: There is no evidence of pericardial effusion. Mitral Valve: MV is thickened with mildy resricted motoin. Mean gradient through the valve is 3.5 cm2. (HR 49 bpm) Unchanged from echo on 02/10/22. There is mild thickening of the mitral valve leaflet(s). There is mild calcification of the mitral valve leaflet(s). Moderate to severe mitral  annular  calcification. Mild mitral valve regurgitation. MV peak gradient, 9.5 mmHg. The mean mitral valve gradient is 3.5 mmHg. Tricuspid Valve: The tricuspid valve is normal in structure. Tricuspid valve regurgitation is trivial. Aortic Valve: S/p TAVR (26 mm Medtronic CoreValve-Evolut Pro prothesis, procedure date 02/10/22) Mean gradient through the valve is 8 mm Hg. No significant change from echo done 02/10/22. The aortic valve has been repaired/replaced. Aortic valve regurgitation is not visualized. Aortic valve mean gradient measures 8.0 mmHg. Aortic valve peak gradient measures 14.8 mmHg. Aortic valve area, by VTI measures 1.87 cm. Pulmonic Valve: The pulmonic valve was normal in structure. Pulmonic valve regurgitation is trivial. Aorta: The aortic root is normal in size and structure. Venous: The inferior vena cava is normal in size with greater than 50% respiratory variability, suggesting right atrial pressure of 3 mmHg. IAS/Shunts: No atrial level shunt detected by color flow Doppler.  LEFT VENTRICLE PLAX 2D LVIDd:         4.95 cm   Diastology LVIDs:         2.90 cm   LV e' medial:    4.68 cm/s LV PW:         1.20 cm   LV E/e' medial:  31.2 LV IVS:        1.30 cm   LV e' lateral:   5.33 cm/s LVOT diam:     1.90 cm   LV E/e' lateral: 27.4 LV SV:         101 LV SV Index:   54 LVOT Area:     2.84 cm  RIGHT VENTRICLE RV S prime:     10.40 cm/s TAPSE (M-mode): 2.4 cm LEFT ATRIUM             Index        RIGHT ATRIUM           Index LA diam:        3.90 cm 2.08 cm/m   RA Area:     22.50 cm LA Vol (A2C):   80.8 ml 43.00 ml/m  RA Volume:   65.70 ml  34.96 ml/m LA Vol (A4C):   72.4 ml 38.53 ml/m LA Biplane Vol: 80.6 ml 42.89 ml/m  AORTIC VALVE AV Area (Vmax):    1.84 cm AV Area (Vmean):   1.99 cm AV Area (VTI):     1.87 cm AV Vmax:           192.50 cm/s AV Vmean:          133.500 cm/s AV VTI:            0.538 m AV Peak Grad:      14.8 mmHg AV Mean Grad:      8.0 mmHg LVOT Vmax:         125.00 cm/s LVOT  Vmean:        93.900 cm/s LVOT VTI:          0.355 m LVOT/AV VTI ratio: 0.66  AORTA Ao Root diam: 2.90 cm MITRAL VALVE                TRICUSPID VALVE MV Area (PHT): 2.54 cm     TR Peak grad:   28.5 mmHg MV Area VTI:   1.54 cm     TR Vmax:        267.00 cm/s MV Peak grad:  9.5 mmHg MV Mean grad:  3.5 mmHg     SHUNTS MV Vmax:       1.54 m/s  Systemic VTI:  0.36 m MV Vmean:      89.8 cm/s    Systemic Diam: 1.90 cm MV Decel Time: 299 msec MV E velocity: 146.00 cm/s MV A velocity: 140.00 cm/s MV E/A ratio:  1.04 Dietrich Pates MD Electronically signed by Dietrich Pates MD Signature Date/Time: 05/11/2022/1:09:27 PM    Final       Blood pressure (!) 168/69, pulse (!) 45, temperature 97.9 F (36.6 C), temperature source Oral, resp. rate 14, height 5\' 3"  (1.6 m), weight 84.5 kg, SpO2 96 %.  Medical Problem List and Plan: 1. 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  Antithrombotics: -DVT/anticoagulation:  Pharmaceutical: Xarelto  -antiplatelet therapy: N/A 3. Pain Management: Oxycodone as needed 4. Mood/Behavior/Sleep: Trazodone 50 mg nightly as needed  -antipsychotic agents: N/A 5. Neuropsych/cognition: This patient is capable of making decisions on her own behalf. 6. Skin/Wound Care: Routine skin checks 7. Fluids/Electrolytes/Nutrition: Routine in and outs with follow-up chemistries 8.  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  PAF/pacemaker.  Continue chronic Xarelto as well as amiodarone 100 mg daily.  Cardiac rate controlled.  Follow-up outpatient Dr. 04/12/2022  -plan is for outpatient pacemaker placement 10.  Hypothyroidism.  Synthroid 11.  CKD.  Creatinine baseline 1.20-1.30.  Follow-up chemistries 12.  Diastolic congestive heart  failure.  Monitor for any signs of fluid overload\  -daily weights 13.  Hyperlipidemia.  Lipitor   Graciela Husbands Angiulli, PA-C 05/12/2022

## 2022-05-12 NOTE — Telephone Encounter (Signed)
Dr. Dulce Sellar is aware that the patient is in the hospital.

## 2022-05-12 NOTE — Progress Notes (Signed)
Informed DR Howerter that pt HR 36-45, and based on the doctor's note, pt baseline HR is 40 bpm and they are considering to do pacemaker to pt

## 2022-05-12 NOTE — Plan of Care (Signed)

## 2022-05-12 NOTE — Progress Notes (Signed)
Inpatient Rehab Admissions Coordinator:  ? ?I have a CIR bed for this Pt. And can admit today. RN may call report to [phone]. ? ?Trish Mancinelli, MS, CCC-SLP ?Rehab Admissions Coordinator  ?[phone] (celll) ?[phone] (office) ? ?

## 2022-05-12 NOTE — Progress Notes (Signed)
DR Howerter advised to continue monitoring of pt v/s since pt was stable and asymptomatic

## 2022-05-12 NOTE — Progress Notes (Signed)
Physical Therapy Treatment Patient Details Name: Rebekah Pope MRN: [id number] DOB: 10-18-1940 Today's Date: 05/12/2022   History of Present Illness Rebekah Pope is a 81 y.o. female who presented to [facility] hospital 05/10/2022 with sudden onset nausea, dizziness, weakness, and emesis. 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 PMHx: complex regional pain syndrome, essential HTN, HLD, hypothyroidism, a fib, TVAR.    PT Comments    Pt greeted supine in bed and agreeable to session with continued progress towards acute goals however pt continues to be limited by dizziness, decreased strength, impaired balance/postural reactions and visual impairments. Pt able to complete bed mobility with guard and transfers to standing with up to mod assist to power up to stand to RW secondary to knee pain and pt endorsed fatigue. Pt requiring up to min assist throughout gait with RW for cues for gaze stabilization techniques and min guard for safety. Pt up in chair at end of session and verbalizing understanding of education re; benefits of OOOB mobility and importance of continued mobility. Current plan remains appropriate to address deficits and maximize functional independence and decrease caregiver burden. Pt continues to benefit from skilled PT services to progress toward functional mobility goals.    Recommendations for follow up therapy are one component of a multi-disciplinary discharge planning process, led by the attending physician.  Recommendations may be updated based on patient status, additional functional criteria and insurance authorization.  Follow Up Recommendations  Acute inpatient rehab (3hours/day)     Assistance Recommended at Discharge Intermittent Supervision/Assistance  Patient can return home with the following A lot of help with walking and/or transfers;A lot of help with bathing/dressing/bathroom;Assistance with  cooking/housework;Assist for transportation;Help with stairs or ramp for entrance   Equipment Recommendations  BSC/3in1    Recommendations for Other Services       Precautions / Restrictions Precautions Precautions: Fall Precaution Comments: diplopia, dysconjugate gaze Restrictions Weight Bearing Restrictions: No     Mobility  Bed Mobility Overal bed mobility: Needs Assistance Bed Mobility: Supine to Sit     Supine to sit: Min guard     General bed mobility comments: mild c/o dizziness upon coming to sit    Transfers Overall transfer level: Needs assistance Equipment used: Rolling walker (2 wheels) Transfers: Sit to/from Stand Sit to Stand: Min assist, Mod assist           General transfer comment: mod a down to min a to power up, cues for hand placement    Ambulation/Gait Ambulation/Gait assistance: Min guard, Min assist (assist for cues for gaze stabilization) Gait Distance (Feet): 75 Feet Assistive device: Rolling walker (2 wheels) Gait Pattern/deviations: Step-through pattern, Decreased stride length Gait velocity: decr     General Gait Details: slowed gait, cues for RW proximity progressign from step to to step through   [employer]    Modified Rankin (Stroke Patients Only)       Balance Overall balance assessment: Needs assistance Sitting-balance support: No upper extremity supported, Feet supported Sitting balance-Leahy Scale: Fair     Standing balance support: Bilateral upper extremity supported, Reliant on assistive device for balance Standing balance-Leahy Scale: Poor Standing balance comment: reliance on external support                            Cognition Arousal/Alertness: Awake/alert Behavior During Therapy: WFL for tasks  assessed/performed Overall Cognitive Status: Within Functional Limits for tasks assessed                                          Exercises  Other Exercises Other Exercises: warm up seated marching x20, LAQ x20    General Comments General comments (skin integrity, edema, etc.): VSS on RA, daughter present and supportive throughout      Pertinent Vitals/Pain Pain Assessment Pain Assessment: Faces Faces Pain Scale: Hurts a little bit Pain Location: bil knees Pain Descriptors / Indicators: Sore Pain Intervention(s): Monitored during session, Limited activity within patient's tolerance    Home Living                          Prior Function            PT Goals (current goals can now be found in the care plan section) Acute Rehab PT Goals PT Goal Formulation: With patient Time For Goal Achievement: 05/24/22 Progress towards PT goals: Progressing toward goals    Frequency    Min 4X/week      PT Plan      Co-evaluation              AM-PAC PT "6 Clicks" Mobility   Outcome Measure  Help needed turning from your back to your side while in a flat bed without using bedrails?: A Little Help needed moving from lying on your back to sitting on the side of a flat bed without using bedrails?: A Little Help needed moving to and from a bed to a chair (including a wheelchair)?: A Little Help needed standing up from a chair using your arms (e.g., wheelchair or bedside chair)?: A Little Help needed to walk in hospital room?: A Little Help needed climbing 3-5 steps with a railing? : Total 6 Click Score: 16    End of Session Equipment Utilized During Treatment: Gait belt Activity Tolerance: Patient tolerated treatment well Patient left: in chair;with call bell/phone within reach;with family/visitor present Nurse Communication: Mobility status PT Visit Diagnosis: Other abnormalities of gait and mobility (R26.89);Other symptoms and signs involving the nervous system (T70.017)     Time: 4944-9675 PT Time Calculation (min) (ACUTE ONLY): 24 min  Charges:  $Gait Training: 8-22 mins $Therapeutic Activity:  8-22 mins                     Diago Haik R. PTA Acute Rehabilitation Services Office: [phone]    Catalina Antigua 05/12/2022, 11:00 AM

## 2022-05-12 NOTE — TOC Transition Note (Signed)
Transition of Care [facility]) - CM/SW Discharge Note   Patient Details  Name: Rebekah Pope MRN: [id number] Date of Birth: 01-26-1941  Transition of Care [facility]) CM/SW Contact:  Kermit Balo, RN Phone Number: 05/12/2022, 11:29 AM   Clinical Narrative:    Pt is discharging to CIR today. No further needs per TOC.    Final next level of care: IP Rehab Facility Barriers to Discharge: No Barriers Identified   Patient Goals and CMS Choice   CMS Medicare.gov Compare Post Acute Care list provided to:: Patient Choice offered to / list presented to : Patient  Discharge Placement                       Discharge Plan and Services                                     Social Determinants of Health (SDOH) Interventions     Readmission Risk Interventions    02/11/2022   11:09 AM  Readmission Risk Prevention Plan  Post Dischage Appt Complete  Medication Screening Complete  Transportation Screening Complete

## 2022-05-12 NOTE — Progress Notes (Signed)
STROKE TEAM PROGRESS NOTE   INTERVAL HISTORY Her husband is at the bedside.   Patient had bradycardia episode last night and has known sick sinus syndrome and cardiology has been consulted and recommend elective pacemaker insertion as an outpatient in a few weeks.  Patient is considered good candidate for inpatient rehab and will likely be transferred today.  Neurological exam is unchanged.  Vital signs are stable.  Vitals:   05/11/22 2328 05/12/22 0338 05/12/22 0800 05/12/22 1120  BP: (!) 179/53 (!) 158/52 (!) 168/69 (!) 153/54  Pulse: (!) 43 (!) 44 (!) 45 (!) 48  Resp: 17 16 14 14   Temp: 98 F (36.7 C) 98.4 F (36.9 C) 97.9 F (36.6 C) 97.9 F (36.6 C)  TempSrc: Oral Oral Oral Oral  SpO2: 98% 96% 96% 99%  Weight:      Height:       CBC:  Recent Labs  Lab 05/10/22 0043 05/10/22 0612  WBC 6.3  --   NEUTROABS 3.9  --   HGB 13.3 12.6  HCT 40.4 37.0  MCV 90.6  --   PLT 189  --    Basic Metabolic Panel:  Recent Labs  Lab 05/10/22 0043 05/10/22 0612 05/12/22 0621  NA 139 139 138  K 4.1 4.2 4.1  CL 107 106 106  CO2 23  --  24  GLUCOSE 125* 120* 104*  BUN 32* 31* 29*  CREATININE 1.17* 1.00 1.27*  CALCIUM 9.1  --  9.0  MG 2.2  --  2.0   Lipid Panel:  Recent Labs  Lab 05/10/22 0511  CHOL 268*  TRIG 27  HDL 87  CHOLHDL 3.1  VLDL 5  LDLCALC 176*   HgbA1c:  Recent Labs  Lab 05/10/22 0511  HGBA1C 6.0*   Urine Drug Screen:  Recent Labs  Lab 05/10/22 0731  LABOPIA NONE DETECTED  COCAINSCRNUR NONE DETECTED  LABBENZ NONE DETECTED  AMPHETMU NONE DETECTED  THCU NONE DETECTED  LABBARB NONE DETECTED    Alcohol Level  Recent Labs  Lab 05/10/22 0511  ETH <10   IMAGING past 24 hours No results found.  PHYSICAL EXAM  Physical Exam  Constitutional: Appears well-developed and well-nourished.  Psych: Affect appropriate to situation, calm and cooperative with examination  Eyes: No scleral injection HENT: No OP obstrucion MSK: no joint deformities.   Cardiovascular: Irregular rate and rhythm  Respiratory: Effort normal, non-labored breathing on room air GI: Soft.  No distension. There is no tenderness.  Skin: WDI  Neuro: Mental Status: Patient is awake, alert, oriented to person, place, month, year, and situation. Patient is able to give a clear and coherent history. No signs of aphasia or neglect Cranial Nerves: II: Visual Fields are full. PERRL III,IV, VI: Right INO present with diplopia   V: Facial sensation is symmetric to temperature VII: Persistent right NLF asymmetry noted  VIII: Hearing is intact to voice X: Palate elevates symmetrically XI: Shoulder shrug is symmetric. XII: Tongue protrudes midline without atrophy or fasciculations.  Motor: Tone is normal. Bulk is normal. 5/5 strength was present in all four extremities without asymmetry or vertical drift.  Sensory: Sensation is symmetric to light touch and temperature in the arms and legs. No extinction to DSS present.  Cerebellar: FNF and HKS are intact bilaterally  ASSESSMENT/PLAN [name] is a 81 y.o. 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  2D Echo pending  LDL 176 HgbA1c 6.0 VTE prophylaxis - SCDs    Diet   Diet Heart Room service appropriate? Yes; Fluid consistency: Thin   Xarelto (rivaroxaban) daily prior to admission, now on Xarelto (rivaroxaban) daily.  Educated patient to take Xarelto with meals for better absorption as  patient previously taking this medication on an empty stomach Therapy recommendations:  CIR Disposition:  pending   Hypertension Home meds:  Norvasc Unstable Permissive hypertension (OK if < 220/120) but gradually normalize in 5-7 days Long-term BP goal normotensive  Hyperlipidemia Home meds:  none LDL 176, goal < 70 Add atorvastatin 80 mg   Continue statin at discharge  Prediabetes Home meds:  None HgbA1c 6.0, goal < 7.0 CBGs No results for input(s): "GLUCAP" in the last 72 hours.  SSI as appropriate Follow up with PCP for further monitoring of A1c  Other Stroke Risk Factors Advanced Age >/= 19  Obesity, Body mass index is 33 kg/m., BMI >/= 30 associated with increased stroke risk, recommend weight loss, diet and exercise as appropriate  Coronary artery disease Congestive heart failure  Other Active Problems   Hospital day # 2  Patient presented with diplopia secondary to pontine stroke per MRI shows multiple cortical and subcortical infarcts likely from atrial fibrillation despite being on anticoagulation with Xarelto.  I had a long discussion with the patient and husband there is no definite data suggesting changing to Eliquis or alternative agent is necessarily superior or adding aspirin is any better.  I advised the patient to take Xarelto with her proper meal and not on empty stomach as she was taking before.   Transfer to inpatient rehab when bed available.  Elective pacemaker placement as per cardiology..  Discussed with Dr. Nelson Chimes, Dr. Hermelinda Medicus and patient and husband and answered questions.  Greater than 50% time during this 35 minute visit were spent in counseling and coordination of care about her stroke and diplopia and answering questions about A-fib and stroke prevention  Delia Heady, MD Medical Director [facility] Pager: [phone] 05/12/2022 4:24 PM  To contact Stroke Continuity provider, please refer to [email or web address]. After hours, contact  General Neurology

## 2022-05-12 NOTE — Progress Notes (Signed)
TRH night cross cover note:   I was notified by RN of patient's HR in the high 30's into the mid - to high 40's.  Blood pressure stable, with systolic values in the 150s to 170s.  Patient has been asymptomatic with her bradycardia, currently sleeping.  She has a documented history of paroxysmal atrial fibrillation complicated by sick sinus syndrome.  Cardiology consulted and following for evaluation pacemaker placement.   Per review of prior vital signs, it appears that she also had similar heart rate last evening as well.  In this patient with a known history of sick sinus syndrome, who is currently asymptomatic with stable blood pressures, and being followed by cardiology, for evaluation for pacemaker, will not make any changes to current orders at this time. Will continue to closely monitor HR, BP, and monitor for development of any associated acute symptoms.     Newton Pigg, DO Hospitalist

## 2022-05-12 NOTE — H&P (Signed)
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  Per chart review patient lives with spouse.  1 level home one-step to entry.  Intermittent use of rolling walker since TAVR procedure.  Presented 05/10/2022 with sudden onset of nausea, dizziness/double vision and weakness with emesis.  MRI showed scattered tiny acute to subacute infarcts suggesting central embolic disease.  Admission chemistries unremarkable except BUN 32 creatinine 1.17, BNP 271, troponin 105-114.  Echo with ejection fraction of 65 to 70% no wall motion abnormality grade 2 diastolic dysfunction.  Neurology follow-up patient remains on Xarelto as prior to admission.  Elevated troponin felt to be related to demand ischemia. Plan now is for pacemaker placement as outpatient. Tolerating a regular consistency diet. Pt with diplopia and depth perception/balance deficits. Therapy evaluations completed due to patient decreased functional mobility was admitted for a comprehensive rehab program.   Review of Systems  Constitutional:  Negative for chills and fever.  HENT:  Negative for hearing loss.   Eyes:  Positive for double vision. Negative for blurred vision.  Respiratory:  Negative for cough, shortness of breath and wheezing.   Cardiovascular:  Positive for palpitations. Negative for chest pain and leg swelling.  Gastrointestinal:  Positive for constipation, nausea and vomiting.  Genitourinary:  Negative for dysuria, flank pain and hematuria.  Musculoskeletal:  Positive for joint pain and myalgias.  Skin:  Negative for rash.  Neurological:  Positive  for dizziness and weakness.  All other systems reviewed and are negative.       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 Excell Seltzer & Dr. Laneta Simmers   Severe aortic stenosis           Past Surgical History:  Procedure Laterality Date   CESAREAN SECTION       INTRAOPERATIVE TRANSTHORACIC ECHOCARDIOGRAM N/A 02/10/2022    Procedure: INTRAOPERATIVE TRANSTHORACIC ECHOCARDIOGRAM;  Surgeon: Tonny Bollman, MD;  Location: [facility] INVASIVE CV LAB;  Service: Open Heart Surgery;  Laterality: N/A;   RIGHT HEART CATH AND CORONARY ANGIOGRAPHY N/A 10/31/2021    Procedure: RIGHT HEART CATH AND CORONARY ANGIOGRAPHY;  Surgeon: Kathleene Hazel, MD;  Location: [facility] INVASIVE CV LAB;  Service: Cardiovascular;  Laterality: N/A;   TRANSCATHETER AORTIC VALVE REPLACEMENT, TRANSFEMORAL N/A 02/10/2022    Procedure: Transcatheter Aortic Valve Replacement, Transfemoral;  Surgeon: Tonny Bollman, MD;  Location: [facility] INVASIVE CV LAB;  Service: Open Heart Surgery;  Laterality: N/A;         Family History  Problem Relation Age of Onset   Dementia Mother     Pneumonia Mother  Hip fracture Mother     Heart disease Father      Social History:  reports that she has never smoked. She has been exposed to tobacco smoke. She has never used smokeless tobacco. She reports that she does not drink alcohol and does not use drugs. Allergies: No Known Allergies       Medications Prior to Admission  Medication Sig Dispense Refill   amiodarone (PACERONE) 200 MG tablet Take 0.5 tablets (100 mg  total) by mouth daily. 45 tablet 3   amLODipine (NORVASC) 10 MG tablet Take 1 tablet (10 mg total) by mouth at bedtime. 90 tablet 3   ENTRESTO 24-26 MG Take 1 tablet by mouth 2 (two) times daily. 180 tablet 3   hydrALAZINE (APRESOLINE) 25 MG tablet Take 1 tablet (25 mg total) by mouth 3 (three) times daily. 270 tablet 3   levothyroxine (SYNTHROID) 112 MCG tablet Take 112 mcg by mouth daily before breakfast.       Rivaroxaban (XARELTO) 15 MG TABS tablet Take 1 tablet (15 mg total) by mouth daily. 90 tablet 3   valsartan (DIOVAN) 40 MG tablet Take 40 mg by mouth daily.       hydrALAZINE (APRESOLINE) 25 MG tablet Take 1 tablet (25 mg total) by mouth every 4 (four) hours as needed (for systolic blood pressure 170 or greater). (Patient not taking: Reported on 05/10/2022) 45 tablet 1          Home: Home Living Family/patient expects to be discharged to:: Private residence Living Arrangements: Spouse/significant other Available Help at Discharge: Family, Available 24 hours/day Type of Home: House Home Access: Stairs to enter [employer] of Steps: 1 Home Layout: One level, Laundry or work area in basement (stair lift to basement) Bathroom Shower/Tub: Engineer, manufacturing systems: Handicapped height Bathroom Accessibility: Yes Home Equipment: Agricultural consultant (2 wheels), [employer] - single point Additional Comments: live wtih husband, son is 2 miles away  Lives With: Spouse   Functional History: Prior Function Prior Level of Function : Independent/Modified Independent Mobility Comments: intermittent use of RW since recent TAVR procedure ADLs Comments: indep. RN 1x/wk to manage medication and assess vitals   Functional Status:  Mobility: Bed Mobility Overal bed mobility: Needs Assistance Bed Mobility: Supine to Sit Supine to sit: Min guard Sit to supine: Min guard General bed mobility comments: mild c/o dizziness upon coming to sit Transfers Overall transfer level: Needs  assistance Equipment used: Rolling walker (2 wheels) Transfers: Sit to/from Stand Sit to Stand: Min assist, Mod assist General transfer comment: mod a down to min a to power up, cues for hand placement Ambulation/Gait Ambulation/Gait assistance: Min guard, Min assist (assist for cues for gaze stabilization) Gait Distance (Feet): 75 Feet Assistive device: Rolling walker (2 wheels) Gait Pattern/deviations: Step-through pattern, Decreased stride length General Gait Details: slowed gait, cues for RW proximity progressign from step to to step through Gait velocity: decr Gait velocity interpretation: <1.31 ft/sec, indicative of household ambulator   ADL: ADL Overall ADL's : Needs assistance/impaired Eating/Feeding: Independent, Sitting Grooming: Set up, Sitting Upper Body Bathing: Set up, Sitting Lower Body Bathing: Maximal assistance, Sit to/from stand Upper Body Dressing : Set up, Sitting Lower Body Dressing: Maximal assistance, Sit to/from stand Toilet Transfer: Moderate assistance, Stand-pivot, BSC/3in1 Toileting- Clothing Manipulation and Hygiene: Min guard, Sitting/lateral lean Functional mobility during ADLs: Min guard, Rolling walker (2 wheels) General ADL Comments: session focused on vision and occlusion strategies   Cognition: Cognition Overall Cognitive Status: Within Functional Limits for tasks  assessed Orientation Level: Oriented X4 Cognition Arousal/Alertness: Awake/alert Behavior During Therapy: WFL for tasks assessed/performed Overall Cognitive Status: Within Functional Limits for tasks assessed General Comments: overall WFL for basic tasks assessed and orientation   Physical Exam: Blood pressure (!) 168/69, pulse (!) 45, temperature 97.9 F (36.6 C), temperature source Oral, resp. rate 14, height 5\' 3"  (1.6 m), weight 84.5 kg, SpO2 96 %. Physical Exam Constitutional:      Appearance: Normal appearance.  HENT:     Head: Normocephalic and atraumatic.     Right  Ear: External ear normal.     Nose: Nose normal.     Mouth/Throat:     Mouth: Mucous membranes are moist.  Eyes:     Extraocular Movements: Extraocular movements intact.     Conjunctiva/sclera: Conjunctivae normal.     Pupils: Pupils are equal, round, and reactive to light.  Cardiovascular:     Rate and Rhythm: Regular rhythm. Bradycardia present.     Heart sounds: No murmur heard.    No gallop.  Pulmonary:     Effort: Pulmonary effort is normal. No respiratory distress.     Breath sounds: No wheezing.  Abdominal:     General: Bowel sounds are normal. There is no distension.     Palpations: Abdomen is soft.     Tenderness: There is no abdominal tenderness.  Musculoskeletal:        General: No swelling or tenderness. Normal range of motion.     Cervical back: Normal range of motion.  Skin:    General: Skin is warm and dry.  Neurological:     Mental Status: She is alert.     Comments: Alert and oriented x 3. Normal insight and awareness. Intact Memory. Normal language and speech. Cranial nerve exam unremarkable except for lagging CN III/IV on left. Does have problems with depth perception and diplopia particularly with vertical gaze and gaze to right. Motor 5/5 UE and LE's. Sensory exam normal for light touch and pain in all 4 limbs. No limb ataxia or cerebellar signs. No abnormal tone appreciated.          Psychiatric:        Mood and Affect: Mood normal.        Behavior: Behavior normal.        Lab Results Last 48 Hours        Results for orders placed or performed during the hospital encounter of 05/10/22 (from the past 48 hour(s))  Basic metabolic panel     Status: Abnormal    Collection Time: 05/12/22  6:21 AM  Result Value Ref Range    Sodium 138 135 - 145 mmol/L    Potassium 4.1 3.5 - 5.1 mmol/L    Chloride 106 98 - 111 mmol/L    CO2 24 22 - 32 mmol/L    Glucose, Bld 104 (H) 70 - 99 mg/dL      Comment: Glucose reference range applies only to samples taken after  fasting for at least 8 hours.    BUN 29 (H) 8 - 23 mg/dL    Creatinine, Ser 14/05/23 (H) 0.44 - 1.00 mg/dL    Calcium 9.0 8.9 - 5.57 mg/dL    GFR, Estimated 42 (L) >60 mL/min      Comment: (NOTE) Calculated using the CKD-EPI Creatinine Equation (2021)      Anion gap 8 5 - 15      Comment: Performed at [facility], [address] [place], [place]  Magnesium     Status: None    Collection Time: 05/12/22  6:21 AM  Result Value Ref Range    Magnesium 2.0 1.7 - 2.4 mg/dL      Comment: Performed at [facility], [address] [place], [place] [address]       Imaging Results (Last 48 hours)  ECHOCARDIOGRAM COMPLETE BUBBLE STUDY   Result Date: 05/11/2022    ECHOCARDIOGRAM REPORT   Patient Name:   AHNNA DUNGAN Date of Exam: 05/11/2022 Medical Rec #:  [id number]            Height:       63.0 in Accession #:    [id number]           Weight:       187.0 lb Date of Birth:  [birth year]             BSA:          1.879 m Patient Age:    81 years             BP:           165/64 mmHg Patient Gender: F                    HR:           48 bpm. Exam Location:  Inpatient Procedure: 2D Echo, Color Doppler and Cardiac Doppler Indications:    Stroke i63.9  History:        Patient has prior history of Echocardiogram examinations, most                 recent 03/11/2022. Arrythmias:Atrial Fibrillation; Risk                 Factors:Dyslipidemia. 39mm Medtronic TAVR Placed 02/10/22.  Sonographer:    Irving Burton Senior RDCS Referring Phys: [id number] Oliver Pila HALL  Sonographer Comments: No IV access for bubble study. IMPRESSIONS  1. Left ventricular ejection fraction, by estimation, is 65 to 70%. The left ventricle has normal function. The left ventricle has no regional wall motion abnormalities. There is mild left ventricular hypertrophy. Left ventricular diastolic parameters are consistent with Grade II diastolic dysfunction (pseudonormalization). Elevated left atrial pressure.  2. Right ventricular systolic  function is normal. The right ventricular size is normal. There is normal pulmonary artery systolic pressure.  3. Left atrial size was moderately dilated.  4. Right atrial size was moderately dilated.  5. MV is thickened with mildy resricted motoin. Mean gradient through the valve is 3.5 cm2. (HR 49 bpm) Unchanged from echo on 02/10/22.. Mild mitral valve regurgitation. Moderate to severe mitral annular calcification.  6. S/p TAVR (26 mm Medtronic CoreValve-Evolut Pro prothesis, procedure date 02/10/22) Mean gradient through the valve is 8 mm Hg. No significant change from echo done 02/10/22.. The aortic valve has been repaired/replaced. Aortic valve regurgitation is not visualized.  7. The inferior vena cava is normal in size with greater than 50% respiratory variability, suggesting right atrial pressure of 3 mmHg. FINDINGS  Left Ventricle: Left ventricular ejection fraction, by estimation, is 65 to 70%. The left ventricle has normal function. The left ventricle has no regional wall motion abnormalities. The left ventricular internal cavity size was normal in size. There is  mild left ventricular hypertrophy. Left ventricular diastolic parameters are consistent with Grade II diastolic dysfunction (pseudonormalization). Elevated left atrial pressure. Right Ventricle: The right ventricular size is normal. Right vetricular wall thickness was not assessed. Right  ventricular systolic function is normal. There is normal pulmonary artery systolic pressure. The tricuspid regurgitant velocity is 2.67 m/s, and with an assumed right atrial pressure of 3 mmHg, the estimated right ventricular systolic pressure is 31.5 mmHg. Left Atrium: Left atrial size was moderately dilated. Right Atrium: Right atrial size was moderately dilated. Pericardium: There is no evidence of pericardial effusion. Mitral Valve: MV is thickened with mildy resricted motoin. Mean gradient through the valve is 3.5 cm2. (HR 49 bpm) Unchanged from echo on 02/10/22.  There is mild thickening of the mitral valve leaflet(s). There is mild calcification of the mitral valve leaflet(s). Moderate to severe mitral annular calcification. Mild mitral valve regurgitation. MV peak gradient, 9.5 mmHg. The mean mitral valve gradient is 3.5 mmHg. Tricuspid Valve: The tricuspid valve is normal in structure. Tricuspid valve regurgitation is trivial. Aortic Valve: S/p TAVR (26 mm Medtronic CoreValve-Evolut Pro prothesis, procedure date 02/10/22) Mean gradient through the valve is 8 mm Hg. No significant change from echo done 02/10/22. The aortic valve has been repaired/replaced. Aortic valve regurgitation is not visualized. Aortic valve mean gradient measures 8.0 mmHg. Aortic valve peak gradient measures 14.8 mmHg. Aortic valve area, by VTI measures 1.87 cm. Pulmonic Valve: The pulmonic valve was normal in structure. Pulmonic valve regurgitation is trivial. Aorta: The aortic root is normal in size and structure. Venous: The inferior vena cava is normal in size with greater than 50% respiratory variability, suggesting right atrial pressure of 3 mmHg. IAS/Shunts: No atrial level shunt detected by color flow Doppler.  LEFT VENTRICLE PLAX 2D LVIDd:         4.95 cm   Diastology LVIDs:         2.90 cm   LV e' medial:    4.68 cm/s LV PW:         1.20 cm   LV E/e' medial:  31.2 LV IVS:        1.30 cm   LV e' lateral:   5.33 cm/s LVOT diam:     1.90 cm   LV E/e' lateral: 27.4 LV SV:         101 LV SV Index:   54 LVOT Area:     2.84 cm  RIGHT VENTRICLE RV S prime:     10.40 cm/s TAPSE (M-mode): 2.4 cm LEFT ATRIUM             Index        RIGHT ATRIUM           Index LA diam:        3.90 cm 2.08 cm/m   RA Area:     22.50 cm LA Vol (A2C):   80.8 ml 43.00 ml/m  RA Volume:   65.70 ml  34.96 ml/m LA Vol (A4C):   72.4 ml 38.53 ml/m LA Biplane Vol: 80.6 ml 42.89 ml/m  AORTIC VALVE AV Area (Vmax):    1.84 cm AV Area (Vmean):   1.99 cm AV Area (VTI):     1.87 cm AV Vmax:           192.50 cm/s AV Vmean:           133.500 cm/s AV VTI:            0.538 m AV Peak Grad:      14.8 mmHg AV Mean Grad:      8.0 mmHg LVOT Vmax:         125.00 cm/s LVOT Vmean:  93.900 cm/s LVOT VTI:          0.355 m LVOT/AV VTI ratio: 0.66  AORTA Ao Root diam: 2.90 cm MITRAL VALVE                TRICUSPID VALVE MV Area (PHT): 2.54 cm     TR Peak grad:   28.5 mmHg MV Area VTI:   1.54 cm     TR Vmax:        267.00 cm/s MV Peak grad:  9.5 mmHg MV Mean grad:  3.5 mmHg     SHUNTS MV Vmax:       1.54 m/s     Systemic VTI:  0.36 m MV Vmean:      89.8 cm/s    Systemic Diam: 1.90 cm MV Decel Time: 299 msec MV E velocity: 146.00 cm/s MV A velocity: 140.00 cm/s MV E/A ratio:  1.04 Dietrich PatesPaula Ross MD Electronically signed by Dietrich PatesPaula Ross MD Signature Date/Time: 05/11/2022/1:09:27 PM    Final            Blood pressure (!) 168/69, pulse (!) 45, temperature 97.9 F (36.6 C), temperature source Oral, resp. rate 14, height 5\' 3"  (1.6 m), weight 84.5 kg, SpO2 96 %.   Medical Problem List and Plan: 1. 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  Antithrombotics: -DVT/anticoagulation:  Pharmaceutical: Xarelto             -antiplatelet therapy: N/A 3. Pain Management: Oxycodone as needed 4. Mood/Behavior/Sleep: Trazodone 50 mg nightly as needed             -antipsychotic agents: N/A 5. Neuropsych/cognition: This patient is capable of making decisions on her own behalf. 6. Skin/Wound Care: Routine skin checks 7. Fluids/Electrolytes/Nutrition: Routine in and outs with follow-up chemistries 8.  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  PAF/pacemaker.  Continue chronic Xarelto as well as amiodarone 100 mg daily.  Cardiac rate controlled.  Follow-up  outpatient Dr. Graciela HusbandsKlein             -plan is for outpatient pacemaker placement 10.  Hypothyroidism.  Synthroid 11.  CKD.  Creatinine baseline 1.20-1.30.  Follow-up chemistries 12.  Diastolic congestive heart failure.  Monitor for any signs of fluid overload\             -daily weights 13.  Hyperlipidemia.  Lipitor     Mcarthur RossettiDaniel J Angiulli, PA-C 05/12/2022  I have personally performed a face to face diagnostic evaluation of this patient and formulated the key components of the plan.  Additionally, I have personally reviewed laboratory data, imaging studies, as well as relevant notes and concur with the physician assistant's documentation above.  The patient's status has not changed from the original H&P.  Any changes in documentation from the acute care chart have been noted above.  Ranelle OysterZachary T. Nasim Garofano, MD, [place] DomFAAPMR

## 2022-05-12 NOTE — Discharge Summary (Signed)
Physician Discharge Summary  Rebekah Pope ZOX:[id number] DOB: 06/06/1941 DOA: 05/10/2022  PCP: Rebekah EmeryHodge, Rebekah F, NP  Admit date: 05/10/2022 Discharge date: 05/12/2022  Admitted From: [facility] Disposition:  cir  Recommendations for Outpatient Follow-up:  Follow up with PCP in 1-2 weeks Please obtain BMP/CBC in one week your next doctors visit.  Xarelto has been switched to Eliquis per cardiology recommendations (Dr Rebekah Pope) Outpatient follow-up with EP cardiology for pacemaker placement.  She has an appointment on 05/20/2022.  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 MRI brain showed scattered tiny acute to subacute infarct suggesting central embolic disease. Neurology was consulted.  At this time recommending to continue home Xarelto. After discussing case with neurology and outpatient cardiology, Dr. Dulce Pope.  Patient has been transitioned to Eliquis as it is more forgiving with abnormal renal issues.  She will also follow-up outpatient with EP cardiology for pacemaker placement.  Has an appointment on 05/20/2022. PT/OT is recommended CIR.     Assessment & Plan:  Principal Problem:   Elevated troponin      Acute/subacute CVA -MRI brain consistent with acute to subacute infarct suggestive of central embolic stroke.  Possible from recent TAVR. -A1c-6.0, LDL-176 - Echocardiogram-EF 72%, grade 2 DD - Statin.  Xarelto switched to Eliquis   Paroxysmal atrial fibrillation -Seen by cardiology, seen by cardiology.  Continue  amiodarone.  Xarelto switched to Eliquis   Hypothyroidism -Continue Synthroid.  TSH-normal   Sick sinus syndrome -Seen by cardiology.  Off-and-on having some bradycardia but remains asymptomatic.  Has outpatient appointment with Dr. Graciela Pope from EP cardiology   S/p recent TAVR -Stable     Case discussed with patient's daughter at bedside and her outpatient cardiologist Dr. Dulce Pope over the phone as well.  Appreciate his input     Discharge Diagnoses:  Principal Problem:   Elevated troponin      Consultations: Cardiology Neurology  Subjective: Feels well, double vision has improved  Discharge Exam: Vitals:   05/12/22 0800 05/12/22 1120  BP: (!) 168/69 (!) 153/54  Pulse: (!) 45 (!) 48  Resp: 14 14  Temp: 97.9 F (36.6 C) 97.9 F (36.6 C)  SpO2: 96% 99%   Vitals:   05/11/22 2328 05/12/22 0338 05/12/22 0800 05/12/22 1120  BP: (!) 179/53 (!) 158/52 (!) 168/69 (!) 153/54  Pulse: (!) 43 (!) 44 (!) 45 (!) 48  Resp: 17 16 14 14   Temp: 98 F (36.7 C) 98.4 F (36.9 C) 97.9 F (36.6 C) 97.9 F (36.6 C)  TempSrc: Oral Oral Oral Oral  SpO2: 98% 96% 96% 99%  Weight:      Height:        General: Pt is alert, awake, not in acute distress Cardiovascular: RRR, S1/S2 +, no rubs, no gallops Respiratory: CTA bilaterally, no wheezing, no rhonchi Abdominal: Soft, NT, ND, bowel sounds + Extremities: no edema, no cyanosis  Discharge Instructions   Allergies as of 05/12/2022   No Known Allergies      Medication List     STOP taking these medications    Rivaroxaban 15 MG Tabs tablet Commonly known as: XARELTO   valsartan  40 MG tablet Commonly known as: DIOVAN       TAKE these medications    amiodarone 200 MG tablet Commonly known as: PACERONE Take 0.5 tablets (100 mg total) by mouth daily.   amLODipine 10 MG tablet Commonly known as: NORVASC Take 1 tablet (10 mg total) by mouth at bedtime.   apixaban 5 MG Tabs tablet Commonly known as: ELIQUIS Take 1  tablet (5 mg total) by mouth 2 (two) times daily.   atorvastatin 80 MG tablet Commonly known as: LIPITOR Take 1 tablet (80 mg total) by mouth daily. Start taking on: May 13, 2022   Entresto 24-26 [facility] Generic drug: sacubitril-valsartan Take 1 tablet by mouth 2 (two) times daily.   hydrALAZINE 25 MG tablet Commonly known as: APRESOLINE Take 1 tablet (25 mg total) by mouth 3 (three) times daily. What changed: Another medication with the same name was removed. Continue taking this medication, and follow the directions you see here.   levothyroxine 112 MCG tablet Commonly known as: SYNTHROID Take 112 mcg by mouth daily before breakfast.        Follow-up Information     Rebekah Emery, NP .   Contact information: [address] [place] [address] [phone]         Rebekah Emery, NP Follow up in 1 week(s).   Contact information: [address] [place] [address] [phone]                No Known Allergies  You were cared for by a hospitalist during your hospital stay. If you have any questions about your discharge medications or the care you received while you were in the hospital after you are discharged, you can call the unit and asked to speak with the hospitalist on call if the hospitalist that took care of you is not available. Once you are discharged, your primary care physician will handle any further medical issues. Please note that no refills for any discharge medications will be authorized once you are discharged, as it is imperative that you return to your primary care physician (or establish a relationship with a primary care physician if you do not have one) for your aftercare needs so that they can reassess your need for medications and monitor your lab values.   Procedures/Studies: ECHOCARDIOGRAM COMPLETE BUBBLE STUDY  Result Date: 05/11/2022    ECHOCARDIOGRAM REPORT   Patient Name:   Rebekah Pope Date of Exam: 05/11/2022 Medical Rec  #:  [id number]            Height:       63.0 in Accession #:    [id number]           Weight:       187.0 lb Date of Birth:  [birth year]             BSA:          1.879 m Patient Age:    81 years             BP:           165/64 mmHg Patient Gender: F                    HR:           48 bpm. Exam Location:  Inpatient Procedure: 2D Echo, Color Doppler and Cardiac Doppler Indications:    Stroke i63.9  History:        Patient  has prior history of Echocardiogram examinations, most                 recent 03/11/2022. Arrythmias:Atrial Fibrillation; Risk                 Factors:Dyslipidemia. 100mm Medtronic TAVR Placed 02/10/22.  Sonographer:    Irving Burton Senior RDCS Referring Phys: [id number] Oliver Pila HALL  Sonographer Comments: No IV access for bubble study. IMPRESSIONS  1. Left ventricular ejection fraction, by estimation, is 65 to 70%. The left ventricle has normal function. The left ventricle has no regional wall motion abnormalities. There is mild left ventricular hypertrophy. Left ventricular diastolic parameters are consistent with Grade II diastolic dysfunction (pseudonormalization). Elevated left atrial pressure.  2. Right ventricular systolic function is normal. The right ventricular size is normal. There is normal pulmonary artery systolic pressure.  3. Left atrial size was moderately dilated.  4. Right atrial size was moderately dilated.  5. MV is thickened with mildy resricted motoin. Mean gradient through the valve is 3.5 cm2. (HR 49 bpm) Unchanged from echo on 02/10/22.. Mild mitral valve regurgitation. Moderate to severe mitral annular calcification.  6. S/p TAVR (26 mm Medtronic CoreValve-Evolut Pro prothesis, procedure date 02/10/22) Mean gradient through the valve is 8 mm Hg. No significant change from echo done 02/10/22.. The aortic valve has been repaired/replaced. Aortic valve regurgitation is not visualized.  7. The inferior vena cava is normal in size with greater than 50% respiratory variability, suggesting right  atrial pressure of 3 mmHg. FINDINGS  Left Ventricle: Left ventricular ejection fraction, by estimation, is 65 to 70%. The left ventricle has normal function. The left ventricle has no regional wall motion abnormalities. The left ventricular internal cavity size was normal in size. There is  mild left ventricular hypertrophy. Left ventricular diastolic parameters are consistent with Grade II diastolic dysfunction (pseudonormalization). Elevated left atrial pressure. Right Ventricle: The right ventricular size is normal. Right vetricular wall thickness was not assessed. Right ventricular systolic function is normal. There is normal pulmonary artery systolic pressure. The tricuspid regurgitant velocity is 2.67 m/s, and with an assumed right atrial pressure of 3 mmHg, the estimated right ventricular systolic pressure is 31.5 mmHg. Left Atrium: Left atrial size was moderately dilated. Right Atrium: Right atrial size was moderately dilated. Pericardium: There is no evidence of pericardial effusion. Mitral Valve: MV is thickened with mildy resricted motoin. Mean gradient through the valve is 3.5 cm2. (HR 49 bpm) Unchanged from echo on 02/10/22. There is mild thickening of the mitral valve leaflet(s). There is mild calcification of the mitral valve leaflet(s). Moderate to severe mitral annular calcification. Mild mitral valve regurgitation. MV peak gradient, 9.5 mmHg. The mean mitral valve gradient is 3.5 mmHg. Tricuspid Valve: The tricuspid valve is normal in structure. Tricuspid valve regurgitation is trivial. Aortic Valve: S/p TAVR (26 mm Medtronic CoreValve-Evolut Pro prothesis, procedure date 02/10/22) Mean gradient through the valve is 8 mm Hg. No significant change from echo done 02/10/22. The aortic valve has been repaired/replaced. Aortic valve regurgitation is not visualized. Aortic valve mean gradient measures 8.0 mmHg. Aortic valve peak gradient measures 14.8 mmHg. Aortic valve area, by VTI measures 1.87 cm. Pulmonic  Valve: The pulmonic valve was normal in structure. Pulmonic valve regurgitation is trivial. Aorta: The aortic root is normal in size and structure. Venous: The inferior vena cava is normal in size with greater than 50% respiratory variability, suggesting right atrial pressure of 3 mmHg. IAS/Shunts: No atrial level shunt detected by color flow Doppler.  LEFT VENTRICLE PLAX 2D LVIDd:         4.95 cm   Diastology LVIDs:         2.90 cm   LV e' medial:    4.68 cm/s LV PW:         1.20 cm   LV E/e' medial:  31.2 LV IVS:        1.30 cm   LV e' lateral:   5.33 cm/s LVOT diam:     1.90 cm   LV E/e' lateral: 27.4 LV SV:         101 LV SV Index:   54 LVOT Area:     2.84 cm  RIGHT VENTRICLE RV S prime:     10.40 cm/s TAPSE (M-mode): 2.4 cm LEFT ATRIUM             Index        RIGHT ATRIUM           Index LA diam:        3.90 cm 2.08 cm/m   RA Area:     22.50 cm LA Vol (A2C):   80.8 ml 43.00 ml/m  RA Volume:   65.70 ml  34.96 ml/m LA Vol (A4C):   72.4 ml 38.53 ml/m LA Biplane Vol: 80.6 ml 42.89 ml/m  AORTIC VALVE AV Area (Vmax):    1.84 cm AV Area (Vmean):   1.99 cm AV Area (VTI):     1.87 cm AV Vmax:           192.50 cm/s AV Vmean:          133.500 cm/s AV VTI:            0.538 m AV Peak Grad:      14.8 mmHg AV Mean Grad:      8.0 mmHg LVOT Vmax:         125.00 cm/s LVOT Vmean:        93.900 cm/s LVOT VTI:          0.355 m LVOT/AV VTI ratio: 0.66  AORTA Ao Root diam: 2.90 cm MITRAL VALVE                TRICUSPID VALVE MV Area (PHT): 2.54 cm     TR Peak grad:   28.5 mmHg MV Area VTI:   1.54 cm     TR Vmax:        267.00 cm/s MV Peak grad:  9.5 mmHg MV Mean grad:  3.5 mmHg     SHUNTS MV Vmax:       1.54 m/s     Systemic VTI:  0.36 m MV Vmean:      89.8 cm/s    Systemic Diam: 1.90 cm MV Decel Time: 299 msec MV E velocity: 146.00 cm/s MV A velocity: 140.00 cm/s MV E/A ratio:  1.04 Dietrich Pates MD Electronically signed by Dietrich Pates MD Signature Date/Time: 05/11/2022/1:09:27 PM    Final    MR BRAIN WO CONTRAST  Result  Date: 05/10/2022 CLINICAL DATA:  Syncope with cerebrovascular cause suspected EXAM: MRI HEAD WITHOUT CONTRAST TECHNIQUE: Multiplanar, multiecho pulse sequences of the brain and surrounding structures were obtained without intravenous contrast. COMPARISON:  None Available. FINDINGS: Brain: Tiny foci of restricted diffusion scattered along the bilateral peripheral cerebral hemispheres, cortical to subcortical in location. Patchy involvement in the right occipital lobe and in the posterior right frontal white matter has a weak diffusion signal suggesting more subacute timing. Tiny chronic infarcts in  the bilateral cerebral cortex. Chronic lacunar infarcts in the left deep cerebral white matter. Brain atrophy that is generalized. Vascular: Major flow voids are preserved Skull and upper cervical spine: Normal marrow signal. Sinuses/Orbits: Unremarkable. IMPRESSION: 1. Scattered tiny acute to subacute infarcts suggesting central embolic disease. 2. Brain atrophy and chronic small vessel ischemia with small chronic cerebellar infarcts bilaterally. Electronically Signed   By: Tiburcio Pea M.D.   On: 05/10/2022 04:13   DG Chest Portable 1 View  Result Date: 05/10/2022 CLINICAL DATA:  Near syncope EXAM: PORTABLE CHEST 1 VIEW COMPARISON:  04/12/2022 FINDINGS: Cardiomegaly. Aortic atherosclerosis. No confluent opacities, effusions or edema. No acute bony abnormality. IMPRESSION: Cardiomegaly.  No active disease. Electronically Signed   By: Charlett Nose M.D.   On: 05/10/2022 01:12     The results of significant diagnostics from this hospitalization (including imaging, microbiology, ancillary and laboratory) are listed below for reference.     Microbiology: No results found for this or any previous visit (from the past 240 hour(s)).   Labs: BNP (last 3 results) Recent Labs    05/10/22 0043  BNP 271.6*   Basic Metabolic Panel: Recent Labs  Lab 05/10/22 0043 05/10/22 0612 05/12/22 0621  NA 139 139 138   K 4.1 4.2 4.1  CL 107 106 106  CO2 23  --  24  GLUCOSE 125* 120* 104*  BUN 32* 31* 29*  CREATININE 1.17* 1.00 1.27*  CALCIUM 9.1  --  9.0  MG 2.2  --  2.0   Liver Function Tests: Recent Labs  Lab 05/10/22 0043  AST 22  ALT 15  ALKPHOS 60  BILITOT 0.5  PROT 7.0  ALBUMIN 3.9   No results for input(s): "LIPASE", "AMYLASE" in the last 168 hours. No results for input(s): "AMMONIA" in the last 168 hours. CBC: Recent Labs  Lab 05/10/22 0043 05/10/22 0612  WBC 6.3  --   NEUTROABS 3.9  --   HGB 13.3 12.6  HCT 40.4 37.0  MCV 90.6  --   PLT 189  --    Cardiac Enzymes: No results for input(s): "CKTOTAL", "CKMB", "CKMBINDEX", "TROPONINI" in the last 168 hours. BNP: Invalid input(s): "POCBNP" CBG: No results for input(s): "GLUCAP" in the last 168 hours. D-Dimer No results for input(s): "DDIMER" in the last 72 hours. Hgb A1c Recent Labs    05/10/22 0511  HGBA1C 6.0*   Lipid Profile Recent Labs    05/10/22 0511  CHOL 268*  HDL 87  LDLCALC 176*  TRIG 27  CHOLHDL 3.1   Thyroid function studies Recent Labs    05/10/22 0511  TSH 0.529   Anemia work up No results for input(s): "VITAMINB12", "FOLATE", "FERRITIN", "TIBC", "IRON", "RETICCTPCT" in the last 72 hours. Urinalysis    Component Value Date/Time   COLORURINE YELLOW 05/10/2022 0731   APPEARANCEUR CLEAR 05/10/2022 0731   LABSPEC 1.015 05/10/2022 0731   PHURINE 6.0 05/10/2022 0731   GLUCOSEU NEGATIVE 05/10/2022 0731   HGBUR NEGATIVE 05/10/2022 0731   BILIRUBINUR NEGATIVE 05/10/2022 0731   KETONESUR NEGATIVE 05/10/2022 0731   PROTEINUR 30 (A) 05/10/2022 0731   NITRITE NEGATIVE 05/10/2022 0731   LEUKOCYTESUR NEGATIVE 05/10/2022 0731   Sepsis Labs Recent Labs  Lab 05/10/22 0043  WBC 6.3   Microbiology No results found for this or any previous visit (from the past 240 hour(s)).   Time coordinating discharge:  I have spent 35 minutes face to face with the patient and on the ward discussing the  patients care, assessment, plan  and disposition with other care givers. >50% of the time was devoted counseling the patient about the risks and benefits of treatment/Discharge disposition and coordinating care.   SIGNED:   Dimple Nanas, MD  [facility] 05/12/2022, 2:01 PM   If 7PM-7AM, please contact night-coverage

## 2022-05-12 NOTE — Progress Notes (Addendum)
PMR Admission Coordinator Pre-Admission Assessment   Patient: Rebekah Pope is an 81 y.o., female MRN: [id number] DOB: 07/30/1940 Height:   Weight:     Insurance Information HMO:     PPO: yes     PCP:      IPA:      80/20:      OTHER:  PRIMARY:  [employer]     Policy#: [id number]      Subscriber: Pt  CM Name: Jeanella Craze       Phone#: [phone]     Fax#: [phone] Pre-Cert#: [id number]      [employer] called with authorization for admit 12/5 for 7 days with update due 12/11 Benefits:  Phone #:      Name:  Eff Date: 06/09/2019- still active  Deductible: does not have  OOP Max: $4,000 ($1,433.61 met)  CIR: $160/day co-pay with a max co-pay of $1,600/admission (10 days)  SNF: $0/day co-pay for days 1-20, $50/day co-pay for days 21-100, limited to 100 days/cal yr   Outpatient:  $20/visit co-pay  Home Health:  100% coverage  DME: 80% coverage; 20% co-insurance  Providers: in network  SECONDARY:       Policy#:      Phone#:    Development worker, community:       Phone#:    The Engineer, petroleum" for patients in Inpatient Rehabilitation Facilities with attached "Privacy Act [place] Records" was provided and verbally reviewed with: Patient   Emergency Contact Information Contact Information       Name Relation Home Work Mobile    [place] Spouse [place] Daughter     [place] Son     [phone]           Current Medical History  Patient Admitting Diagnosis: CVA History of Present Illness:  HPI: Rebekah Pope is a 81 y.o. 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  Last known well around 1 PM.  Was sitting at her table and paying her bills.  No slurred speech or focal motor neurological deficits.  Associated with lightheadedness and profound nausea and vomiting x 3.  Her double vision persists in the ED, improves when she closes one eye.MRI brain with scattered tiny acute to subacute infarcts suggesting central embolic disease. Pt. With some bradycardia during admission, plans to see her cardiologist outpatient for potential pacemaker placement. Echo showed eft ventricular ejection fraction, by estimation, is 65 to 70% and right ventricular systolic function that is normal.  Pt seen by PT/OT who recommend CIR to assist return to PLOF.    Complete NIHSS TOTAL: 0   Patient's medical record from [facility] has been reviewed by the rehabilitation admission coordinator and physician.   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 Burt Knack & Dr. Cyndia Bent   Severe aortic stenosis        Has the patient had major surgery during 100 days prior to admission? No   Family History   family history includes Dementia in her mother; Heart disease in her father; Hip fracture in her mother; Pneumonia in her mother.   Current Medications   Current Facility-Administered Medications:    acetaminophen (TYLENOL) tablet 650 mg, 650 mg, Oral, Q6H PRN, Amin, Ankit Chirag, MD   amiodarone (PACERONE) tablet 100 mg, 100 mg, Oral, Daily, Hall, Carole N, DO, 100 mg at 05/11/22  1045   atorvastatin (LIPITOR) tablet 80 mg, 80 mg, Oral, Daily, Hall, Carole N, DO, 80 mg at 05/11/22 1044   guaiFENesin (ROBITUSSIN) 100 MG/5ML liquid 5 mL, 5 mL, Oral, Q4H PRN, Amin, Ankit Chirag, MD   ipratropium-albuterol (DUONEB) 0.5-2.5 (3) MG/3ML nebulizer solution 3 mL, 3 mL, Nebulization, Q4H PRN, Amin, Ankit Chirag, MD   labetalol (NORMODYNE) injection 5 mg, 5 mg, Intravenous, Q2H PRN, Hall, Carole N, DO   levothyroxine (SYNTHROID) tablet 112 mcg, 112 mcg, Oral, QAC breakfast, Hall, Carole N, DO, 112 mcg at 05/11/22 0730   ondansetron (ZOFRAN) injection 4 mg, 4 mg, Intravenous, Q6H PRN, Amin, Ankit Chirag, MD   oxyCODONE (Oxy IR/ROXICODONE) immediate release tablet 5 mg, 5 mg, Oral, Q4H PRN, Amin, Ankit Chirag, MD   Rivaroxaban (XARELTO) tablet 15 mg, 15 mg, Oral, QAC supper, Hall, Carole N, DO, 15 mg at 05/10/22 1723   senna-docusate (Senokot-S) tablet 1 tablet, 1 tablet, Oral, QHS PRN, Amin, Ankit Chirag, MD   traZODone (DESYREL) tablet 50 mg, 50 mg, Oral, QHS PRN, Amin, Ankit Chirag, MD   Current Outpatient Medications:    amiodarone (PACERONE) 200 MG tablet, Take 0.5 tablets (100 mg total) by mouth daily., Disp: 45 tablet, Rfl: 3   amLODipine (NORVASC) 10 MG tablet, Take 1 tablet (10 mg total) by mouth at bedtime., Disp: 90 tablet, Rfl: 3   ENTRESTO 24-26 MG, Take 1 tablet by mouth 2 (two) times daily., Disp: 180 tablet, Rfl: 3   hydrALAZINE (APRESOLINE) 25 MG tablet, Take 1 tablet (25 mg total) by mouth 3 (three) times daily., Disp: 270 tablet, Rfl: 3   levothyroxine (SYNTHROID) 112 MCG tablet, Take 112 mcg by mouth daily before breakfast., Disp: , Rfl:    Rivaroxaban (XARELTO) 15 MG TABS tablet, Take 1 tablet (15 mg total) by mouth daily., Disp: 90 tablet, Rfl: 3   valsartan (DIOVAN) 40 MG tablet, Take 40 mg by mouth daily., Disp: , Rfl:    hydrALAZINE (APRESOLINE) 25 MG tablet, Take 1 tablet (25 mg total) by mouth every 4 (four) hours as needed (for systolic blood pressure 974 or  greater). (Patient not taking: Reported on 05/10/2022), Disp: 45 tablet, Rfl: 1   Patients Current Diet:  Diet Order                  Diet Heart Room service appropriate? Yes; Fluid consistency: Thin  Diet effective now                         Precautions / Restrictions Precautions Precautions: Fall Precaution Comments: diplopia, dysconjugate gaze Restrictions Weight Bearing Restrictions: No    Has the  patient had 2 or more falls or a fall with injury in the past year? No   Prior Activity Level Community (5-7x/wk): active in the community PTA   Prior Functional Level Self Care: Did the patient need help bathing, dressing, using the toilet or eating? Independent   Indoor Mobility: Did the patient need assistance with walking from room to room (with or without device)? Independent   Stairs: Did the patient need assistance with internal or external stairs (with or without device)? Independent   Functional Cognition: Did the patient need help planning regular tasks such as shopping or remembering to take medications? Independent   Patient Information Are you of Hispanic, Latino/a,or Spanish origin?: A. No, not of Hispanic, Latino/a, or Spanish origin What is your race?: A. White Do you need or want an interpreter to communicate with a doctor or health care staff?: 0. No   Patient's Response To:  Health Literacy and Transportation Is the patient able to respond to health literacy and transportation needs?: Yes Health Literacy - How often do you need to have someone help you when you read instructions, pamphlets, or other written material from your doctor or pharmacy?: Never In the past 12 months, has lack of transportation kept you from medical appointments or from getting medications?: No In the past 12 months, has lack of transportation kept you from meetings, work, or from getting things needed for daily living?: No   Home Assistive Devices / Equipment Home Equipment:  Conservation officer, nature (2 wheels), [employer] - single point   Prior Device Use: Indicate devices/aids used by the patient prior to current illness, exacerbation or injury? Walker   Current Functional Level Cognition   Overall Cognitive Status: Within Functional Limits for tasks assessed Orientation Level: Oriented X4 General Comments: overall WFL for basic tasks assessed and orientation    Extremity Assessment (includes Sensation/Coordination)   Upper Extremity Assessment: Defer to OT evaluation  Lower Extremity Assessment: Generalized weakness     ADLs   Overall ADL's : Needs assistance/impaired Eating/Feeding: Independent, Sitting Grooming: Set up, Sitting Upper Body Bathing: Set up, Sitting Lower Body Bathing: Maximal assistance, Sit to/from stand Upper Body Dressing : Set up, Sitting Lower Body Dressing: Maximal assistance, Sit to/from stand Toilet Transfer: Moderate assistance, Stand-pivot, BSC/3in1 Toileting- Clothing Manipulation and Hygiene: Min guard, Sitting/lateral lean Functional mobility during ADLs: Moderate assistance General ADL Comments: Limited by DV, nausea, poor balance, generalized weakness. ED eval without AD. mod A for safety and side stepping/pivoting only     Mobility   Overal bed mobility: Needs Assistance Bed Mobility: Supine to Sit, Sit to Supine Supine to sit: Min guard Sit to supine: Min guard General bed mobility comments: with eyes closed due to dizziness and nausea     Transfers   Overall transfer level: Needs assistance Equipment used: Rolling walker (2 wheels) Transfers: Sit to/from Stand Sit to Stand: Min assist, From elevated surface General transfer comment: mod A for balance and pt fear of falling     Ambulation / Gait / Stairs / Wheelchair Mobility   Ambulation/Gait Ambulation/Gait assistance: Counsellor (Feet): 15 Feet Assistive device: Rolling walker (2 wheels) Gait Pattern/deviations: Step-to pattern General Gait Details:  slowed step-to gait, minimal foot clearance. PT providing cues for gaze stabilization when mobilizing Gait velocity: reduced Gait velocity interpretation: <1.31 ft/sec, indicative of household ambulator     Posture / Balance Dynamic Sitting Balance Sitting balance - Comments: wtih 1 eye closed Balance Overall balance assessment: Needs assistance Sitting-balance support:  No upper extremity supported, Feet supported Sitting balance-Leahy Scale: Fair Sitting balance - Comments: wtih 1 eye closed Standing balance support: Bilateral upper extremity supported, Reliant on assistive device for balance Standing balance-Leahy Scale: Poor     Special needs/care consideration Skin intact  and Special service needs none    Previous Home Environment (from acute therapy documentation) Living Arrangements: Spouse/significant other  Lives With: Spouse Available Help at Discharge: Family, Available 24 hours/day Type of Home: House Home Layout: One level, Laundry or work area in basement (stair lift to basement) Home Access: Stairs to enter Technical brewer of Steps: 1 Bathroom Shower/Tub: Chiropodist: Handicapped height Bathroom Accessibility: Yes How Accessible: Accessible via walker Additional Comments: live wtih husband, son is 2 miles away   Discharge Living Setting Plans for Discharge Living Setting: Patient's home Type of Home at Discharge: House Discharge Home Layout: One level, Laundry or work area in basement Discharge Home Access: Stairs to enter Entrance Stairs-Rails: None Technical brewer of Steps: 1 Discharge Bathroom Shower/Tub: [place] unit Discharge Bathroom Toilet: Handicapped height Discharge Bathroom Accessibility: Yes How Accessible: Accessible via walker   [place] Patient Roles: Spouse Contact Information: [phone] Anticipated Caregiver: Pessy Delamar Anticipated Caregiver's Contact Information:  [phone] Ability/Limitations of Caregiver: Can do light assist Caregiver Availability: 24/7 Discharge Plan Discussed with Primary Caregiver: Yes Is Caregiver In Agreement with Plan?: Yes Does Caregiver/Family have Issues with Lodging/Transportation while Pt is in Rehab?: No   Goals Patient/Family Goal for Rehab: PT/OT Supervision Expected length of stay: 5-7 days Pt/Family Agrees to Admission and willing to participate: Yes Program Orientation Provided & Reviewed with Pt/Caregiver Including Roles  & Responsibilities: Yes   Decrease burden of Care through IP rehab admission: none   Possible need for SNF placement upon discharge: not anticipated   Patient Condition: I have reviewed medical records [facility] , spoken with CM, and patient and spouse. I met with patient at the bedside for inpatient rehabilitation assessment.  Patient will benefit from ongoing PT and OT, can actively participate in 3 hours of therapy a day 5 days of the week, and can make measurable gains during the admission.  Patient will also benefit from the coordinated team approach during an Inpatient Acute Rehabilitation admission.  The patient will receive intensive therapy as well as Rehabilitation physician, nursing, social worker, and care management interventions.  Due to safety, skin/wound care, disease management, medication administration, pain management, and patient education the patient requires 24 hour a day rehabilitation nursing.  The patient is currently min A-min G with mobility and basic ADLs.  Discharge setting and therapy post discharge at home with home health is anticipated.  Patient has agreed to participate in the Acute Inpatient Rehabilitation Program and will admit today.   Preadmission Screen Completed By:  Genella Mech, 05/11/2022 2:56 PM ______________________________________________________________________   Discussed status with Dr. Naaman Plummer  on 05/12/22 at 9.30 and  received approval for admission today.   Admission Coordinator:  Genella Mech, CCC-SLP, time 1130/Date 05/12/22    Assessment/Plan: Diagnosis:Scattered bi-cerebral infarcts Does the need for close, 24 hr/day Medical supervision in concert with the patient's rehab needs make it unreasonable for this patient to be served in a less intensive setting? Yes Co-Morbidities requiring supervision/potential complications: SSS with PPM, PAF, htn,  Due to bladder management, bowel management, safety, skin/wound care, disease management, medication administration, pain management, and patient education, does the patient require 24 hr/day rehab nursing? Yes Does the patient require coordinated care  of a physician, rehab nurse, PT, OT to address physical and functional deficits in the context of the above medical diagnosis(es)? Yes Addressing deficits in the following areas: balance, endurance, locomotion, strength, transferring, bowel/bladder control, bathing, dressing, feeding, grooming, toileting, and psychosocial support Can the patient actively participate in an intensive therapy program of at least 3 hrs of therapy 5 days a week? Yes The potential for patient to make measurable gains while on inpatient rehab is excellent Anticipated functional outcomes upon discharge from inpatient rehab: supervision PT, supervision OT, n/a SLP Estimated rehab length of stay to reach the above functional goals is: 5-7 days Anticipated discharge destination: Home 10. Overall Rehab/Functional Prognosis: excellent     MD Signature: Meredith Staggers, MD, [place] Director Rehabilitation Services 05/12/2022

## 2022-05-12 NOTE — Progress Notes (Signed)
Patient was transitioned from Xarelto to Eliquis upon discharge to inpatient rehab services

## 2022-05-12 NOTE — Progress Notes (Signed)
INPATIENT REHABILITATION ADMISSION NOTE   Arrival Method: bed     Mental Orientation:alert   Assessment:done   Skin:done   IV'S:none   Pain:none   Tubes and Drains:none   Safety Measures:done   Vital Signs:done   Height and Weight:done   Rehab Orientation:done   Family:present    Notes:

## 2022-05-13 DIAGNOSIS — I639 Cerebral infarction, unspecified: Secondary | ICD-10-CM | POA: Diagnosis not present

## 2022-05-13 LAB — CBC WITH DIFFERENTIAL/PLATELET
Abs Immature Granulocytes: 0.02 10*3/uL (ref 0.00–0.07)
Basophils Absolute: 0.1 10*3/uL (ref 0.0–0.1)
Basophils Relative: 1 %
Eosinophils Absolute: 0.2 10*3/uL (ref 0.0–0.5)
Eosinophils Relative: 4 %
HCT: 35.3 % — ABNORMAL LOW (ref 36.0–46.0)
Hemoglobin: 11.8 g/dL — ABNORMAL LOW (ref 12.0–15.0)
Immature Granulocytes: 0 %
Lymphocytes Relative: 29 %
Lymphs Abs: 1.8 10*3/uL (ref 0.7–4.0)
MCH: 29.6 pg (ref 26.0–34.0)
MCHC: 33.4 g/dL (ref 30.0–36.0)
MCV: 88.5 fL (ref 80.0–100.0)
Monocytes Absolute: 0.6 10*3/uL (ref 0.1–1.0)
Monocytes Relative: 10 %
Neutro Abs: 3.5 10*3/uL (ref 1.7–7.7)
Neutrophils Relative %: 56 %
Platelets: 186 10*3/uL (ref 150–400)
RBC: 3.99 MIL/uL (ref 3.87–5.11)
RDW: 13.5 % (ref 11.5–15.5)
WBC: 6.1 10*3/uL (ref 4.0–10.5)
nRBC: 0 % (ref 0.0–0.2)

## 2022-05-13 LAB — COMPREHENSIVE METABOLIC PANEL
ALT: 13 U/L (ref 0–44)
AST: 25 U/L (ref 15–41)
Albumin: 3.1 g/dL — ABNORMAL LOW (ref 3.5–5.0)
Alkaline Phosphatase: 53 U/L (ref 38–126)
Anion gap: 6 (ref 5–15)
BUN: 30 mg/dL — ABNORMAL HIGH (ref 8–23)
CO2: 23 mmol/L (ref 22–32)
Calcium: 8.7 mg/dL — ABNORMAL LOW (ref 8.9–10.3)
Chloride: 107 mmol/L (ref 98–111)
Creatinine, Ser: 1.34 mg/dL — ABNORMAL HIGH (ref 0.44–1.00)
GFR, Estimated: 39 mL/min — ABNORMAL LOW (ref >60)
Glucose, Bld: 102 mg/dL — ABNORMAL HIGH (ref 70–99)
Potassium: 4.1 mmol/L (ref 3.5–5.1)
Sodium: 136 mmol/L (ref 135–145)
Total Bilirubin: 0.7 mg/dL (ref 0.3–1.2)
Total Protein: 5.6 g/dL — ABNORMAL LOW (ref 6.5–8.1)

## 2022-05-13 NOTE — Progress Notes (Signed)
Physical Therapy Session Note  Patient Details  Name: Rebekah Pope MRN: [id number] Date of Birth: 11-27-1940  Today's Date: 05/13/2022 PT Individual Time: [phone] PT Individual Time Calculation (min): 73 min   Short Term Goals: Week 1:  PT Short Term Goal 1 (Week 1): = to LTGs based on ELOS  Skilled Therapeutic Interventions/Progress Updates:  Pt received sitting in w/c with her husband, Gerlene Burdock, and daughter, Onley Sink, present and pt agreeable to therapy session. Pt wearing eye patch on L during session.  Transported to/from gym in w/c for time management and energy conservation.  Patient participated in [facility] and demonstrates increased fall risk as noted by score of  29/56.  (<36= high risk for falls, close to 100%; 37-45 significant >80%; 46-51 moderate >50%; 52-55 lower >25%).  Gait training ~168ft, no UE support, with min assist for balance. Pt demonstrating the following gait deviations with therapist providing the described cuing and facilitation for improvement:  - downward gaze to the ground for improved balance - guarded UB posturing due to feeling unstable - slow gait speed although slight reciprocal stepping - R/L postural sway with minor staggering in the direction of her LOB but pt able to maintain balance without increased assist this afternoon  Vitals after gait: BP 183/63 (MAP 97), HR 45bpm   Reassessed after 5 min seated rest break: BP 183/58 (MAP 96), HR 41bpm   Consulted with RN and based on pt's cardiac hx and MD comments during Team conference, pt has no restrictions on her BP preventing her from participating in therapy and pt denies any symptoms.  Dynamic gait training including:  - 44ft side stepping in each direction, no UE support, with min assist for balance and cuing to stay turned sideways - 27ft backwards walking, L HHA, with min assist for balance and pt having consistent R posterior lean/LOB due to not fully weight shifting L to allow  large enough R LE step  Gait training additional ~4ft, no UE support, with min assist for balance while participating in slow horizontal and vertical head rotations - no significant changes in gait pattern nor increased postural sway noted.  Transported back to room and pt requesting to remain up in chair. Family present and daughter made therapist aware of pt's scheduled follow-up cardiologist appointment with Dr. Graciela Husbands next Wednesday, Dec 13th - thearpist notified MD and PA to discuss plan with pt and her daughter. Pt left with needs in reach and seat belt alarm on.    Therapy Documentation Precautions:  Precautions Precautions: Fall, Other (comment) Precaution Comments: diplopia, dysconjugate gaze Restrictions Weight Bearing Restrictions: No   Pain: Denies pain during session.  Balance: Balance Balance Assessed: Yes Standardized Balance Assessment Standardized Balance Assessment: Berg Balance Test Berg Balance Test Sit to Stand: Able to stand  independently using hands (uses hands baseline due to bilateral knee pain) Standing Unsupported: Able to stand 2 minutes with supervision (pt with slight anterior/posterior sway) Sitting with Back Unsupported but Feet Supported on Floor or Stool: Able to sit 2 minutes under supervision Stand to Sit: Sits safely with minimal use of hands Transfers: Needs one person to assist Standing Unsupported with Eyes Closed: Able to stand 10 seconds with supervision Standing Ubsupported with Feet Together: Able to place feet together independently and stand for 1 minute with supervision From Standing, Reach Forward with Outstretched Arm: Reaches forward but needs supervision (able to reach >5" with supervision) From Standing Position, Pick up Object from Floor: Able to pick up shoe,  needs supervision From Standing Position, Turn to Look Behind Over each Shoulder: Needs supervision when turning (turns fully in both directions) Turn 360 Degrees: Needs  close supervision or verbal cueing Standing Unsupported, Alternately Place Feet on Step/Stool: Able to complete >2 steps/needs minimal assist (requires L HHA) Standing Unsupported, One Foot in Front: Needs help to step but can hold 15 seconds (LOB at 26 seconds) Standing on One Leg: Tries to lift leg/unable to hold 3 seconds but remains standing independently Total Score: 29     Therapy/Group: Individual Therapy  Ginny Forth , PT, DPT, NCS, CSRS 05/13/2022, 3:37 PM

## 2022-05-13 NOTE — Evaluation (Signed)
Physical Therapy Assessment and Plan  Patient Details  Name: Rebekah Pope MRN: [id number] Date of Birth: 08/27/1940  PT Diagnosis: Abnormality of gait, Difficulty walking, and Muscle weakness Rehab Potential: Good ELOS: 7-10 days   Today's Date: 05/13/2022 PT Individual Time: 0805-0915 PT Individual Time Calculation (min): 70 min    Hospital Problem: Principal Problem:   Ischemic cerebrovascular accident (CVA) ([place])   Past Medical History:  Past Medical History:  Diagnosis Date   Allergic rhinitis    Complex regional pain syndrome i of unspecified lower limb    Dizziness and giddiness    Essential (primary) hypertension    History of hiatal hernia    Hyperlipidemia    Hypothyroidism    Localized edema    Paroxysmal atrial fibrillation (HCC)    PONV (postoperative nausea and vomiting)    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 Burt Knack & Dr. Cyndia Bent   Severe aortic stenosis    Past Surgical History:  Past Surgical History:  Procedure Laterality Date   CESAREAN SECTION     INTRAOPERATIVE TRANSTHORACIC ECHOCARDIOGRAM N/A 02/10/2022   Procedure: INTRAOPERATIVE TRANSTHORACIC ECHOCARDIOGRAM;  Surgeon: Sherren Mocha, MD;  Location: [facility];  Service: Open Heart Surgery;  Laterality: N/A;   RIGHT HEART CATH AND CORONARY ANGIOGRAPHY N/A 10/31/2021   Procedure: RIGHT HEART CATH AND CORONARY ANGIOGRAPHY;  Surgeon: Burnell Blanks, MD;  Location: [facility];  Service: Cardiovascular;  Laterality: N/A;   TRANSCATHETER AORTIC VALVE REPLACEMENT, TRANSFEMORAL N/A 02/10/2022   Procedure: Transcatheter Aortic Valve Replacement, Transfemoral;  Surgeon: Sherren Mocha, MD;  Location: [facility];  Service: Open Heart Surgery;  Laterality: N/A;    Assessment & Plan Clinical Impression: Patient is a 81 y.o. year old right-handed female with history of CKD with creatinine  baseline 5.46-5.68, diastolic congestive heart failure, hyperlipidemia, LBBB, PAF/aortic stenosis status post TAVR on 02/10/2022 as well as sick sinus rhythm with pacemaker maintained on Xarelto as well as amiodarone.  Per chart review patient lives with spouse.  1 level home one-step to entry.  Intermittent use of rolling walker since TAVR procedure.  Presented 05/10/2022 with sudden onset of nausea, dizziness/double vision and weakness with emesis.  MRI showed scattered tiny acute to subacute infarcts suggesting central embolic disease.  Admission chemistries unremarkable except BUN 32 creatinine 1.17, BNP 271, troponin 105-114.  Echo with ejection fraction of 65 to 70% no wall motion abnormality grade 2 diastolic dysfunction.  Neurology follow-up patient remains on Xarelto as prior to admission.  Elevated troponin felt to be related to demand ischemia. Plan now is for pacemaker placement as outpatient. Tolerating a regular consistency diet. Pt with diplopia and depth perception/balance deficits. Therapy evaluations completed due to patient decreased functional mobility was admitted for a comprehensive rehab program. Patient transferred to CIR on 05/12/2022 .   Patient currently requires min assist with mobility secondary to muscle weakness, decreased cardiorespiratoy endurance,  , decreased visual motor skills, decreased memory, and decreased sitting balance, decreased standing balance, decreased postural control, and decreased balance strategies.  Prior to hospitalization, patient was independent  with mobility and lived with Spouse (husband, Delfino Lovett) in a House home.  Home access is 1Stairs to enter.  Patient will benefit from skilled PT intervention to maximize safe functional mobility, minimize fall risk, and decrease caregiver burden for planned discharge home with 24 hour supervision.  Anticipate patient will benefit from follow  up OP at discharge.  PT - End of Session Activity Tolerance: Tolerates 30+  min activity with multiple rests Endurance Deficit: Yes Endurance Deficit Description: seated rest breaks throughout mobility PT Assessment Rehab Potential (ACUTE/IP ONLY): Good PT Barriers to Discharge: Incontinence;Decreased caregiver support PT Patient demonstrates impairments in the following area(s): Balance;Perception;Safety;Skin Integrity;Endurance;Motor;Nutrition;Pain PT Transfers Functional Problem(s): Bed Mobility;Bed to Chair;Car;Furniture PT Locomotion Functional Problem(s): Ambulation;Stairs PT Plan PT Intensity: Minimum of 1-2 x/day ,45 to 90 minutes PT Frequency: 5 out of 7 days PT Duration Estimated Length of Stay: 7-10 days PT Treatment/Interventions: Ambulation/gait training;Community reintegration;DME/adaptive equipment instruction;Neuromuscular re-education;Psychosocial support;Stair training;UE/LE Strength taining/ROM;Balance/vestibular training;Discharge planning;Functional electrical stimulation;Pain management;Skin care/wound management;Therapeutic Activities;UE/LE Coordination activities;Cognitive remediation/compensation;Disease management/prevention;Functional mobility training;Splinting/orthotics;Patient/family education;Therapeutic Exercise;Visual/perceptual remediation/compensation PT Transfers Anticipated Outcome(s): supervision using LRAD PT Locomotion Anticipated Outcome(s): supervision using LRAD PT Recommendation Recommendations for Other Services: Therapeutic Recreation consult;Speech consult Therapeutic Recreation Interventions: Outing/community reintergration;Kitchen group Follow Up Recommendations: Outpatient PT;24 hour supervision/assistance Patient destination: Home Equipment Recommended: To be determined   PT Evaluation Precautions/Restrictions  Precautions Precautions: Fall;Other (comment) Precaution Comments: diplopia, dysconjugate gaze Restrictions Weight Bearing Restrictions: No Pain Pain Assessment Pain Scale: 0-10 Pain Score: 0-No  pain Pain Interference Pain Interference Pain Effect on Sleep: 0. Does not apply - I have not had any pain or hurting in the past 5 days Pain Interference with Therapy Activities: 1. Rarely or not at all Pain Interference with Day-to-Day Activities: 1. Rarely or not at all Home Living/Prior [place] Available Help at Discharge: Family;Available 24 hours/day (husband and daughter [name]) will be 24hr support - dtr flew down from [place] and is available until no longer needed) Type of Home: House Home Access: Stairs to enter [employer] of Steps: 1 Home Layout: One level;Laundry or work area in basement (chair lift to go to basement)  Lives With: Spouse (husband, Richard) Prior Function Level of Independence: Independent with gait;Independent with transfers;Independent with homemaking with ambulation (has limited community outings since TAVR in Sep 2023 but takes cane for communiy mobility; had mostly stopped using RW in the house (was using it consistently in home after TAVR) -- was still cooking and cleaning but husband does the laundry)  Able to Take Stairs?:  (takes chair lift to get to basement) Driving: Yes (hasn't driven since TAVR in Sep 2023) Vision/Perception  Vision - History Ability to See in Adequate Light: 0 Adequate (when wearing glasses) Vision - Assessment Eye Alignment: Impaired (comment) (eyes not in alignment at rest looking at target in midline) Ocular Range of Motion: Restricted on the left Alignment/Gaze Preference: Within Defined Limits Tracking/Visual Pursuits: Left eye does not track medially;Decreased smoothness of horizontal tracking;Decreased smoothness of vertical tracking Diplopia Assessment: Disappears with one eye closed;Objects split on top of one another;Other (comment) (pt's preference is to cover R eye with eye patch) Perception Perception: Impaired Comments: reports impaired depth perception Praxis Praxis: Intact   Cognition Overall Cognitive Status: Within Functional Limits for tasks assessed Arousal/Alertness: Awake/alert Orientation Level: Oriented X4 Year: 2023 Month: December Day of Week: Correct Attention: Focused;Sustained Focused Attention: Appears intact Sustained Attention: Appears intact Memory: Impaired Memory Impairment: Decreased recall of new information (pt asking 3x during session whether she is going to shower during rehab) Awareness: Appears intact Safety/Judgment: Appears intact Sensation Sensation Light Touch: Appears Intact Hot/Cold: Not tested Proprioception: Appears Intact Stereognosis: Not tested Coordination Gross Motor Movements are Fluid and Coordinated: Yes Coordination and Movement Description: impaired balance during gait but GM movements of LEs are not noticeably incoordinated Motor  Motor  Motor: Other (comment) Motor - Skilled Clinical Observations: impaired balance   Trunk/Postural Assessment  Cervical Assessment Cervical Assessment: Exceptions to [facility] (slight forward head) Thoracic Assessment Thoracic Assessment: Exceptions to [facility] (slight thoracic kyphosis) Lumbar Assessment Lumbar Assessment: Exceptions to [facility] (flexible posterior pelvic tilt in sitting) Postural Control Postural Control: Deficits on evaluation Righting Reactions: delayed and inadequate for moderate or larger LOB Postural Limitations: decreased  Balance Balance Balance Assessed: Yes Static Sitting Balance Static Sitting - Balance Support: Feet supported Static Sitting - Level of Assistance: 5: Stand by assistance Dynamic Sitting Balance Dynamic Sitting - Balance Support: Feet supported Dynamic Sitting - Level of Assistance: 5: Stand by assistance;Other (comment) (CGA) Static Standing Balance Static Standing - Balance Support: During functional activity;Right upper extremity supported Static Standing - Level of Assistance: 4: Min assist Dynamic Standing Balance Dynamic  Standing - Balance Support: During functional activity;Right upper extremity supported Dynamic Standing - Level of Assistance: 3: Mod assist Extremity Assessment      RLE Assessment RLE Assessment: Exceptions to [facility] Active Range of Motion (AROM) Comments: WFL/WNL General Strength Comments: assessed in sitting RLE Strength Right Hip Flexion: 4+/5 Right Knee Flexion: 4+/5 Right Knee Extension: 4+/5 Right Ankle Dorsiflexion: 4+/5 Right Ankle Plantar Flexion: 4+/5 LLE Assessment LLE Assessment: Exceptions to [facility] Active Range of Motion (AROM) Comments: WFL/WNL General Strength Comments: assessed in sitting LLE Strength Left Hip Flexion: 4+/5 Left Knee Flexion: 4+/5 Left Knee Extension: 4+/5 Left Ankle Dorsiflexion: 4+/5 Left Ankle Plantar Flexion: 4+/5  Care Tool Care Tool Bed Mobility Roll left and right activity   Roll left and right assist level: Supervision/Verbal cueing    Sit to lying activity   Sit to lying assist level: Supervision/Verbal cueing    Lying to sitting on side of bed activity   Lying to sitting on side of bed assist level: the ability to move from lying on the back to sitting on the side of the bed with no back support.: Supervision/Verbal cueing     Care Tool Transfers Sit to stand transfer   Sit to stand assist level: Minimal Assistance - Patient > 75%    Chair/bed transfer   Chair/bed transfer assist level: Minimal Assistance - Patient > 75%     Physiological scientist transfer assist level: Minimal Assistance - Patient > 75%      Care Tool Locomotion Ambulation   Assist level: Moderate Assistance - Patient 50 - 74% Assistive device: Hand held assist Max distance: 162f  Walk 10 feet activity   Assist level: Minimal Assistance - Patient > 75% Assistive device: Hand held assist   Walk 50 feet with 2 turns activity   Assist level: Moderate Assistance - Patient - 50 - 74% Assistive device: Hand held assist  Walk 150  feet activity   Assist level: Moderate Assistance - Patient - 50 - 74% Assistive device: Hand held assist  Walk 10 feet on uneven surfaces activity   Assist level: Minimal Assistance - Patient > 75% Assistive device: Hand held assist;Other (comment) (railing)  Stairs   Assist level: Minimal Assistance - Patient > 75% Stairs assistive device: 2 hand rails Max number of stairs: 4  Walk up/down 1 step activity   Walk up/down 1 step (curb) assist level: Minimal Assistance - Patient > 75% Walk up/down 1 step or curb assistive device: 2 hand rails  Walk up/down 4 steps activity   Walk up/down 4 steps assist level: Minimal Assistance -  Patient > 75% Walk up/down 4 steps assistive device: 2 hand rails  Walk up/down 12 steps activity Walk up/down 12 steps activity did not occur: Safety/medical concerns      Pick up small objects from floor   Pick up small object from the floor assist level: Moderate Assistance - Patient 50 - 74%    Wheelchair Is the patient using a wheelchair?: Yes (only for time management) Type of Wheelchair: Manual   Wheelchair assist level: Dependent - Patient 0%    Wheel 50 feet with 2 turns activity   Assist Level: Dependent - Patient 0%  Wheel 150 feet activity   Assist Level: Dependent - Patient 0%    Refer to Care Plan for Long Term Goals  SHORT TERM GOAL WEEK 1 PT Short Term Goal 1 (Week 1): = to LTGs based on ELOS  Recommendations for other services: Therapeutic Recreation  Kitchen group and Outing/community reintegration and Other: SLP consult for cognition  Skilled Therapeutic Intervention Pt received supported upright in bed and agreeable to therapy session. Evaluation completed (see details above) with patient education regarding purpose of PT evaluation, PT POC and goals, therapy schedule, weekly team meetings, and other CIR information including safety plan and fall risk safety. Pt performed the below functional mobility tasks with the specified  levels of skilled cuing and assistance. Pt wearing eye patch on R eye throughout session. Vitals after gait training: BP 165/51 (MAP 86), HR 43bpm without symptoms. At end of session, pt left seated in w/c with needs in reach and seat belt alarm on.  Mobility Bed Mobility Bed Mobility: Supine to Sit;Sit to Supine Supine to Sit: Supervision/Verbal cueing Sit to Supine: Supervision/Verbal cueing Transfers Transfers: Sit to Stand;Stand to Sit;Stand Pivot Transfers Sit to Stand: Minimal Assistance - Patient > 75% Stand to Sit: Minimal Assistance - Patient > 75% Stand Pivot Transfers: Minimal Assistance - Patient > 75% Stand Pivot Transfer Details: Verbal cues for sequencing;Tactile cues for sequencing;Tactile cues for weight shifting;Verbal cues for technique;Verbal cues for precautions/safety Transfer (Assistive device): None (relying on w/c armrests for support) Locomotion  Gait Ambulation: Yes Gait Assistance: Minimal Assistance - Patient > 75%;Moderate Assistance - Patient 50-74% (intermittent light mod assist when LOB occurs (primarily towards L)) Gait Distance (Feet): 160 Feet Assistive device: 1 person hand held assist (R HHA) Gait Assistance Details: Verbal cues for precautions/safety;Verbal cues for gait pattern;Tactile cues for sequencing;Tactile cues for weight shifting;Tactile cues for posture Gait Gait: Yes Gait Pattern: Impaired Gait Pattern: Step-through pattern;Decreased step length - right;Decreased step length - left (occasional mild L LOB) Gait velocity: decreased Stairs / Additional Locomotion Stairs: Yes Stairs Assistance: Contact Guard/Touching assist Stair Management Technique: Step to pattern;Two rails;Forwards (leading with R LE on ascent and L LE on descent) Number of Stairs: 4 Height of Stairs: 6 Ramp: Minimal Assistance - Patient >75% (R HHA and railing) Wheelchair Mobility Wheelchair Mobility: No   Discharge Criteria: Patient will be discharged from PT if  patient refuses treatment 3 consecutive times without medical reason, if treatment goals not met, if there is a change in medical status, if patient makes no progress towards goals or if patient is discharged from hospital.  The above assessment, treatment plan, treatment alternatives and goals were discussed and mutually agreed upon: by patient  Tawana Scale , PT, DPT, NCS, CSRS 05/13/2022, 8:54 AM

## 2022-05-13 NOTE — Progress Notes (Signed)
Had a quite night. VS monitored frequently due to  patient history. Denies pain. Ambulatory but unsteady on her feet, with balance issues. Remains alert and oriented. No new changes to report. Safety maintained.

## 2022-05-13 NOTE — Progress Notes (Signed)
Inpatient Rehabilitation Center Individual Statement of Services  Patient Name:  Rebekah Pope  Date:  05/13/2022  Welcome to the Inpatient Rehabilitation Center.  Our goal is to provide you with an individualized program based on your diagnosis and situation, designed to meet your specific needs.  With this comprehensive rehabilitation program, you will be expected to participate in at least 3 hours of rehabilitation therapies Monday-Friday, with modified therapy programming on the weekends.  Your rehabilitation program will include the following services:  Physical Therapy (PT), Occupational Therapy (OT), Speech Therapy (ST), 24 hour per day rehabilitation nursing, Therapeutic Recreaction (TR), Neuropsychology, Care Coordinator, Rehabilitation Medicine, Nutrition Services, Pharmacy Services, and Other  Weekly team conferences will be held on Wednesdays to discuss your progress.  Your Inpatient Rehabilitation Care Coordinator will talk with you frequently to get your input and to update you on team discussions.  Team conferences with you and your family in attendance may also be held.  Expected length of stay: 5-7 Days  Overall anticipated outcome:  Supervision  Depending on your progress and recovery, your program may change. Your Inpatient Rehabilitation Care Coordinator will coordinate services and will keep you informed of any changes. Your Inpatient Rehabilitation Care Coordinator's name and contact numbers are listed  below.  The following services may also be recommended but are not provided by the Inpatient Rehabilitation Center:   Home Health Rehabiltiation Services Outpatient Rehabilitation Services    Arrangements will be made to provide these services after discharge if needed.  Arrangements include referral to agencies that provide these services.  Your insurance has been verified to be:  [employer] Your primary doctor is:  Drusilla Kanner, NP  Pertinent information  will be shared with your doctor and your insurance company.  Inpatient Rehabilitation Care Coordinator:  Lavera Guise, [place] [phone] or [phone]  Information discussed with and copy given to patient by: Andria Rhein, 05/13/2022, 3:51 PM

## 2022-05-13 NOTE — Plan of Care (Signed)
  Problem: RH Balance Goal: LTG Patient will maintain dynamic sitting balance (PT) Description: LTG:  Patient will maintain dynamic sitting balance with assistance during mobility activities (PT) Flowsheets (Taken 05/13/2022 1249) LTG: Pt will maintain dynamic sitting balance during mobility activities with:: Independent with assistive device  Goal: LTG Patient will maintain dynamic standing balance (PT) Description: LTG:  Patient will maintain dynamic standing balance with assistance during mobility activities (PT) Flowsheets (Taken 05/13/2022 1249) LTG: Pt will maintain dynamic standing balance during mobility activities with:: Supervision/Verbal cueing   Problem: Sit to Stand Goal: LTG:  Patient will perform sit to stand with assistance level (PT) Description: LTG:  Patient will perform sit to stand with assistance level (PT) Flowsheets (Taken 05/13/2022 1249) LTG: PT will perform sit to stand in preparation for functional mobility with assistance level: Supervision/Verbal cueing   Problem: RH Bed Mobility Goal: LTG Patient will perform bed mobility with assist (PT) Description: LTG: Patient will perform bed mobility with assistance, with/without cues (PT). Flowsheets (Taken 05/13/2022 1249) LTG: Pt will perform bed mobility with assistance level of: Independent with assistive device    Problem: RH Bed to Chair Transfers Goal: LTG Patient will perform bed/chair transfers w/assist (PT) Description: LTG: Patient will perform bed to chair transfers with assistance (PT). Flowsheets (Taken 05/13/2022 1249) LTG: Pt will perform Bed to Chair Transfers with assistance level: Supervision/Verbal cueing   Problem: RH Car Transfers Goal: LTG Patient will perform car transfers with assist (PT) Description: LTG: Patient will perform car transfers with assistance (PT). Flowsheets (Taken 05/13/2022 1249) LTG: Pt will perform car transfers with assist:: Supervision/Verbal cueing   Problem: RH  Ambulation Goal: LTG Patient will ambulate in controlled environment (PT) Description: LTG: Patient will ambulate in a controlled environment, # of feet with assistance (PT). Flowsheets (Taken 05/13/2022 1249) LTG: Pt will ambulate in controlled environ  assist needed:: Supervision/Verbal cueing LTG: Ambulation distance in controlled environment: 155ft using LRAD Goal: LTG Patient will ambulate in home environment (PT) Description: LTG: Patient will ambulate in home environment, # of feet with assistance (PT). Flowsheets (Taken 05/13/2022 1249) LTG: Pt will ambulate in home environ  assist needed:: Supervision/Verbal cueing LTG: Ambulation distance in home environment: 6ft using LRAD   Problem: RH Stairs Goal: LTG Patient will ambulate up and down stairs w/assist (PT) Description: LTG: Patient will ambulate up and down # of stairs with assistance (PT) Flowsheets (Taken 05/13/2022 1249) LTG: Pt will ambulate up/down stairs assist needed:: Supervision/Verbal cueing LTG: Pt will  ambulate up and down number of stairs: 1 step-up to simulate home entry

## 2022-05-13 NOTE — Progress Notes (Signed)
Inpatient Rehabilitation Admission Medication Review by a Pharmacist  A complete drug regimen review was completed for this patient to identify any potential clinically significant medication issues.  High Risk Drug Classes Is patient taking? Indication by Medication  Antipsychotic No   Anticoagulant Yes apixaban - atrial fibrillation  Antibiotic No   Opioid Yes Oxycodone - PRN pain  Antiplatelet No   Hypoglycemics/insulin No   Vasoactive Medication Yes Amiodarone - afib  Chemotherapy No   Other Yes Atorvastatin - HLD, stroke ppx Duonebs - PRN SOB Levothyroxine - hypothyroid Senokot-S - PRN constipation Trazodone - PRN sleep     Type of Medication Issue Identified Description of Issue Recommendation(s)  Drug Interaction(s) (clinically significant)     Duplicate Therapy     Allergy     No Medication Administration End Date     Incorrect Dose     Additional Drug Therapy Needed     Significant med changes from prior encounter (inform family/care partners about these prior to discharge). Xarelto changed to Eliquis  Valsartan changed to Entresto  Amlodipine, Entresto, hydralazine not resumed at CIR admit Resume as able during CIR admission  Communicate medication changes with patient/family at discharge  Other       Clinically significant medication issues were identified that warrant physician communication and completion of prescribed/recommended actions by midnight of the next day:  No  Pharmacist comments: n/a  Time spent performing this drug regimen review (minutes): 20   Thank you for allowing pharmacy to be a part of this patient's care.  Thelma Barge, PharmD Clinical Pharmacist

## 2022-05-13 NOTE — Plan of Care (Signed)
  Problem: RH Balance Goal: LTG Patient will maintain dynamic standing with ADLs (OT) Description: LTG:  Patient will maintain dynamic standing balance with assist during activities of daily living (OT)  Flowsheets (Taken 05/13/2022 1619) LTG: Pt will maintain dynamic standing balance during ADLs with: Supervision/Verbal cueing   Problem: RH Bathing Goal: LTG Patient will bathe all body parts with assist levels (OT) Description: LTG: Patient will bathe all body parts with assist levels (OT) Flowsheets (Taken 05/13/2022 1619) LTG: Pt will perform bathing with assistance level/cueing: Supervision/Verbal cueing LTG: Position pt will perform bathing: Shower   Problem: RH Dressing Goal: LTG Patient will perform lower body dressing w/assist (OT) Description: LTG: Patient will perform lower body dressing with assist, with/without cues in positioning using equipment (OT) Flowsheets (Taken 05/13/2022 1619) LTG: Pt will perform lower body dressing with assistance level of: Supervision/Verbal cueing   Problem: RH Toileting Goal: LTG Patient will perform toileting task (3/3 steps) with assistance level (OT) Description: LTG: Patient will perform toileting task (3/3 steps) with assistance level (OT)  Flowsheets (Taken 05/13/2022 1619) LTG: Pt will perform toileting task (3/3 steps) with assistance level: Supervision/Verbal cueing   Problem: RH Vision Goal: RH LTG Vision Consulting civil engineer) Flowsheets (Taken 05/13/2022 1619) LTG: Vision Goals: Pt will utilize compensatory stratgies during BADLs to decrease double vision with supervision across 3/4 encounters.   Problem: RH Simple Meal Prep Goal: LTG Patient will perform simple meal prep w/assist (OT) Description: LTG: Patient will perform simple meal prep with assistance, with/without cues (OT). Flowsheets (Taken 05/13/2022 1619) LTG: Pt will perform simple meal prep with assistance level of: Supervision/Verbal cueing LTG: Pt will perform simple meal prep  w/level of: Ambulate with device   Problem: RH Laundry Goal: LTG Patient will perform laundry w/assist, cues (OT) Description: LTG: Patient will perform laundry with assistance, with/without cues (OT). Flowsheets (Taken 05/13/2022 1619) LTG: Pt will perform laundry with assistance level of: Supervision/Verbal cueing LTG: Pt will perform laundry with level of: Ambulate with device   Problem: RH Toilet Transfers Goal: LTG Patient will perform toilet transfers w/assist (OT) Description: LTG: Patient will perform toilet transfers with assist, with/without cues using equipment (OT) Flowsheets (Taken 05/13/2022 1619) LTG: Pt will perform toilet transfers with assistance level of: Supervision/Verbal cueing   Problem: RH Tub/Shower Transfers Goal: LTG Patient will perform tub/shower transfers w/assist (OT) Description: LTG: Patient will perform tub/shower transfers with assist, with/without cues using equipment (OT) Flowsheets (Taken 05/13/2022 1619) LTG: Pt will perform tub/shower stall transfers with assistance level of: Supervision/Verbal cueing   Problem: RH Balance Goal: LTG: Patient will maintain dynamic sitting balance (OT) Description: LTG:  Patient will maintain dynamic sitting balance with assistance during activities of daily living (OT) Flowsheets (Taken 05/13/2022 1619) LTG: Pt will maintain dynamic sitting balance during ADLs with: Supervision/Verbal cueing

## 2022-05-13 NOTE — Evaluation (Signed)
Occupational Therapy Assessment and Plan  Patient Details  Name: Rebekah Pope MRN: [id number] Date of Birth: 1941/03/12  OT Diagnosis: cognitive deficits, disturbance of vision, and muscle weakness (generalized) Rehab Potential: Rehab Potential (ACUTE ONLY): Good ELOS: 7-10 days   Today's Date: 05/13/2022 OT Individual Time: 8891-6945 OT Individual Time Calculation (min): 57 min     Hospital Problem: Principal Problem:   Ischemic cerebrovascular accident (CVA) ([place])   Past Medical History:  Past Medical History:  Diagnosis Date   Allergic rhinitis    Complex regional pain syndrome i of unspecified lower limb    Dizziness and giddiness    Essential (primary) hypertension    History of hiatal hernia    Hyperlipidemia    Hypothyroidism    Localized edema    Paroxysmal atrial fibrillation (HCC)    PONV (postoperative nausea and vomiting)    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 Burt Knack & Dr. Cyndia Bent   Severe aortic stenosis    Past Surgical History:  Past Surgical History:  Procedure Laterality Date   CESAREAN SECTION     INTRAOPERATIVE TRANSTHORACIC ECHOCARDIOGRAM N/A 02/10/2022   Procedure: INTRAOPERATIVE TRANSTHORACIC ECHOCARDIOGRAM;  Surgeon: Sherren Mocha, MD;  Location: [facility];  Service: Open Heart Surgery;  Laterality: N/A;   RIGHT HEART CATH AND CORONARY ANGIOGRAPHY N/A 10/31/2021   Procedure: RIGHT HEART CATH AND CORONARY ANGIOGRAPHY;  Surgeon: Burnell Blanks, MD;  Location: [facility];  Service: Cardiovascular;  Laterality: N/A;   TRANSCATHETER AORTIC VALVE REPLACEMENT, TRANSFEMORAL N/A 02/10/2022   Procedure: Transcatheter Aortic Valve Replacement, Transfemoral;  Surgeon: Sherren Mocha, MD;  Location: [facility];  Service: Open Heart Surgery;  Laterality: N/A;    Assessment & Plan Clinical Impression: Rebekah Pope is a 81 year old  right-handed female with history of CKD with creatinine baseline 0.38-8.82, diastolic congestive heart failure, hyperlipidemia, LBBB, PAF/aortic stenosis status post TAVR on 02/10/2022 as well as sick sinus rhythm with pacemaker maintained on Xarelto as well as amiodarone.  Per chart review patient lives with spouse.  1 level home one-step to entry.  Intermittent use of rolling walker since TAVR procedure.  Presented 05/10/2022 with sudden onset of nausea, dizziness/double vision and weakness with emesis.  MRI showed scattered tiny acute to subacute infarcts suggesting central embolic disease.  Admission chemistries unremarkable except BUN 32 creatinine 1.17, BNP 271, troponin 105-114.  Echo with ejection fraction of 65 to 70% no wall motion abnormality grade 2 diastolic dysfunction.  Neurology follow-up patient remains on Xarelto as prior to admission.  Elevated troponin felt to be related to demand ischemia. Plan now is for pacemaker placement as outpatient. Tolerating a regular consistency diet. Pt with diplopia and depth perception/balance deficits.  Patient transferred to CIR on 05/12/2022 .    Patient currently requires min with basic self-care skills and IADL secondary to muscle weakness, decreased cardiorespiratoy endurance, decreased coordination, decreased visual perceptual skills and decreased visual motor skills, decreased safety awareness and decreased memory, and decreased sitting balance, decreased standing balance, decreased postural control, and decreased balance strategies.  Prior to hospitalization, patient could complete BADLs and IADLs with independent .  Patient will benefit from skilled intervention to decrease level of assist with basic self-care skills, increase independence with basic self-care skills, and increase level of independence with iADL prior to discharge home with care partner.  Anticipate patient will require 24 hour supervision and follow  up outpatient.  OT - End of  Session Endurance Deficit: Yes Endurance Deficit Description: seated rest breaks throughout mobility OT Assessment Rehab Potential (ACUTE ONLY): Good OT Patient demonstrates impairments in the following area(s): Balance;Perception;Safety;Cognition;Endurance;Motor;Vision OT Basic ADL's Functional Problem(s): Grooming;Bathing;Dressing;Toileting OT Advanced ADL's Functional Problem(s): Simple Meal Preparation;Light Housekeeping OT Transfers Functional Problem(s): Tub/Shower;Toilet OT Plan OT Intensity: Minimum of 1-2 x/day, 45 to 90 minutes OT Frequency: 5 out of 7 days OT Duration/Estimated Length of Stay: 7-10 days OT Treatment/Interventions: Balance/vestibular training;Discharge planning;Self Care/advanced ADL retraining;Therapeutic Activities;UE/LE Coordination activities;Pain management;Cognitive remediation/compensation;Functional mobility training;Patient/family education;Therapeutic Exercise;Visual/perceptual remediation/compensation;Community reintegration;DME/adaptive equipment instruction;Neuromuscular re-education;Psychosocial support;UE/LE Strength taining/ROM OT Self Feeding Anticipated Outcome(s): Independent OT Basic Self-Care Anticipated Outcome(s): Supervision OT Toileting Anticipated Outcome(s): Supervision OT Bathroom Transfers Anticipated Outcome(s): Supervsion OT Recommendation Patient destination: Home Follow Up Recommendations: Outpatient OT Equipment Recommended: To be determined   OT Evaluation Precautions/Restrictions  Precautions Precautions: Fall;Other (comment) Precaution Comments: diplopia, dysconjugate gaze Restrictions Weight Bearing Restrictions: No General Chart Reviewed: Yes Additional Pertinent History: Uncontrolled HTN Family/Caregiver Present: Yes (DTR and husband in room) Vital Signs  Pain Pain Assessment Pain Scale: 0-10 Pain Score: 0-No pain Home Living/Prior Functioning Home Living Family/patient expects to be discharged to:: Private  residence Living Arrangements: Spouse/significant other Available Help at Discharge: Family, Available 24 hours/day Type of Home: House Home Access: Stairs to enter Technical brewer of Steps: 1 Home Layout: One level, Laundry or work area in basement (stair lift to reah laundry in basement) Bathroom Shower/Tub: Chiropodist: Handicapped height Bathroom Accessibility: Yes Additional Comments: live wtih husband, son is 2 miles away  Lives With: Spouse IADL History Homemaking Responsibilities: Yes Meal Prep Responsibility: Secondary Laundry Responsibility: Secondary Cleaning Responsibility: Secondary Bill Paying/Finance Responsibility: Secondary Shopping Responsibility: Secondary Child Care Responsibility: Secondary Current License: Yes Mode of Transportation: Car Occupation: Retired Leisure and Hobbies: Attends church, 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, Volunteers at [employer], enjoys reading Prior Function Level of Independence: Independent with gait, Independent with transfers, Independent with homemaking with ambulation  Able to Take Stairs?:  (use stair lift to go up/down stairs) Driving: Yes Vocation: Volunteer work Leisure: Hobbies-yes (Comment) (Enjoys reading) Vision Baseline Vision/History: 1 Wears glasses Ability to See in Adequate Light: 0 Adequate Patient Visual Report: Diplopia;Nausea/blurring vision with head movement;Blurring of vision;Eye fatigue/eye pain/headache Vision Assessment?: Yes Eye Alignment: Impaired (comment) Ocular Range of Motion: Restricted on the left;Restricted on the right Alignment/Gaze Preference: Gaze right Tracking/Visual Pursuits: Left eye does not track medially;Decreased smoothness of horizontal tracking;Decreased smoothness of vertical tracking;Decreased smoothness of eye movement to LEFT superior field;Requires cues, head turns, or add eye shifts to track;Impaired - to be further tested in functional context Saccades:  Additional eye shifts occurred during testing;Undershoots Convergence: Impaired - to be further tested in functional context Visual Fields: No apparent deficits Diplopia Assessment: Disappears with one eye closed;Objects split on top of one another;Other (comment);Present in primary gaze;Present in far gaze Depth Perception: Undershoots Perception  Perception: Impaired Spatial Orientation: Pt reporting depth pereption impairement Praxis Praxis: Intact Cognition Cognition Overall Cognitive Status: Within Functional Limits for tasks assessed Arousal/Alertness: Awake/alert Orientation Level: Person;Place;Situation Person: Oriented Place: Oriented Situation: Oriented Memory: Impaired Memory Impairment: Decreased recall of new information Attention: Focused;Sustained Focused Attention: Appears intact Sustained Attention: Appears intact Awareness: Appears intact Problem Solving: Appears intact Executive Function: Self Monitoring Self Monitoring: Impaired Self Monitoring Impairment: Functional complex Safety/Judgment: Appears intact Brief Interview for Mental Status (BIMS) Repetition of Three Words (First Attempt): 3 Temporal Orientation: Year: Correct Temporal Orientation: Month: Accurate within 5  days Temporal Orientation: Day: Correct Recall: "Sock": Yes, no cue required Recall: "Blue": Yes, no cue required Recall: "Bed": Yes, no cue required BIMS Summary Score: 15 Sensation Sensation Light Touch: Appears Intact Hot/Cold: Appears Intact Proprioception: Appears Intact Stereognosis: Not tested Coordination Gross Motor Movements are Fluid and Coordinated: Yes Fine Motor Movements are Fluid and Coordinated: No Finger Nose Finger Test: Undershooting during assessment with L>R Motor  Motor Motor: Other (comment) Motor - Skilled Clinical Observations: impaired balance  Trunk/Postural Assessment  Cervical Assessment Cervical Assessment: Exceptions to [facility] (slight forward  head) Thoracic Assessment Thoracic Assessment: Exceptions to [facility] (rounded shoudlers, slight kyphosis) Lumbar Assessment Lumbar Assessment: Exceptions to [facility] Postural Control Postural Control: Deficits on evaluation Righting Reactions: delayed and inadequate for moderate or larger LOB Postural Limitations: decreased  Balance Balance Balance Assessed: Yes Static Sitting Balance Static Sitting - Balance Support: Feet supported Static Sitting - Level of Assistance: 5: Stand by assistance Dynamic Sitting Balance Dynamic Sitting - Balance Support: Feet supported Dynamic Sitting - Level of Assistance: 5: Stand by assistance;Other (comment) Sitting balance - Comments: wtih 1 eye closed Static Standing Balance Static Standing - Balance Support: During functional activity;Right upper extremity supported Static Standing - Level of Assistance: 4: Min assist Dynamic Standing Balance Dynamic Standing - Balance Support: During functional activity;Right upper extremity supported Dynamic Standing - Level of Assistance: 3: Mod assist;4: Min assist Extremity/Trunk Assessment RUE Assessment RUE Assessment: Within Functional Limits Passive Range of Motion (PROM) Comments: 5/5 LUE Assessment LUE Assessment: Within Functional Limits Passive Range of Motion (PROM) Comments: 5/5  Care Tool Care Tool Self Care Eating   Eating Assist Level: Supervision/Verbal cueing    Oral Care    Oral Care Assist Level: Supervision/Verbal cueing    Bathing   Body parts bathed by patient: Right arm;Left arm;Chest;Abdomen;Front perineal area;Right lower leg;Left upper leg;Right upper leg;Buttocks;Left lower leg;Face     Assist Level: Contact Guard/Touching assist    Upper Body Dressing(including orthotics)   What is the patient wearing?: Pull over shirt   Assist Level: Supervision/Verbal cueing    Lower Body Dressing (excluding footwear)   What is the patient wearing?: Underwear/pull up;Pants Assist for  lower body dressing: Minimal Assistance - Patient > 75%    Putting on/Taking off footwear   What is the patient wearing?: Socks Assist for footwear: Minimal Assistance - Patient > 75%       Care Tool Toileting Toileting activity   Assist for toileting: Contact Guard/Touching assist     Care Tool Bed Mobility Roll left and right activity   Roll left and right assist level: Supervision/Verbal cueing    Sit to lying activity   Sit to lying assist level: Supervision/Verbal cueing    Lying to sitting on side of bed activity   Lying to sitting on side of bed assist level: the ability to move from lying on the back to sitting on the side of the bed with no back support.: Supervision/Verbal cueing     Care Tool Transfers Sit to stand transfer   Sit to stand assist level: Minimal Assistance - Patient > 75%    Chair/bed transfer   Chair/bed transfer assist level: Minimal Assistance - Patient > 75%     Toilet transfer         Care Tool Cognition  Expression of Ideas and Wants Expression of Ideas and Wants: 3. Some difficulty - exhibits some difficulty with expressing needs and ideas (e.g, some words or finishing thoughts) or speech is not clear  Understanding Verbal and Non-Verbal Content Understanding Verbal and Non-Verbal Content: 4. Understands (complex and basic) - clear comprehension without cues or repetitions   Memory/Recall Ability Memory/Recall Ability : Current season;Staff names and faces;That he or she is in a hospital/hospital unit   Refer to Care Plan for [place] 1 OT Short Term Goal 1 (Week 1): Pt will visually scan within R and L visual fields to identify items needed for grooming tasks with min verbal cues OT Short Term Goal 2 (Week 1): Pt will utilize compensatory strategies for diplopia during functional tasks with min A OT Short Term Goal 3 (Week 1): Pt will complete LB dressing CGA  Recommendations for other services: Other:  Vision Assessment     Skilled Therapeutic Intervention  Therapeutic Intervention Progress/Updates:  Pt received resting in wc in good spirits and agreeable to skilled OT session. Pt family in room, DTR and Husband upon OT arrival. Pt and family provided education on OT role, goals, and POC with OT evaluation and treatment initiated this session. Pt completed BADLs at levels listed below. Pt completed functional mobility within room using RW CGA-min A. Pt able to transfer to toilet and tub bench using RW CGA and moderate verbal cues for body mechanics and safety. Pt completed shower seated in tub bench close supervision UB and CGA LB with functional reaching and visually scanning for objects during session. Pt requires mod verbal cues to utilize compensatory strategies for vision within functional tasks. Pt donned shirt seated EOB close supervision and pants min A to weave feet. Pt brushed her teeth standing at sink set-up A. Pt reported double vision when reaching for shampoo bottles and tooth brush undershooting during functional reach for objects. Pt limited by visual deficits and decreased coordination during today's session. Pt would benefit from further OT services to increase independence in BADLs and IADLs.   ADL ADL Eating: Supervision/safety Where Assessed-Eating: Edge of bed Grooming: Setup Where Assessed-Grooming: Edge of bed Upper Body Bathing: Supervision/safety Where Assessed-Upper Body Bathing: Shower Lower Body Bathing: Contact guard Where Assessed-Lower Body Bathing: Shower Upper Body Dressing: Supervision/safety Where Assessed-Upper Body Dressing: Edge of bed Lower Body Dressing: Minimal assistance Where Assessed-Lower Body Dressing: Edge of bed Toileting: Contact guard Where Assessed-Toileting: Glass blower/designer: Therapist, music Method: Counselling psychologist: Energy manager: Curator  Method: Heritage manager: Civil engineer, contracting with back Mobility  Transfers Sit to Stand: Minimal Assistance - Patient > 75% Stand to Sit: Minimal Assistance - Patient > 75%   Discharge Criteria: Patient will be discharged from OT if patient refuses treatment 3 consecutive times without medical reason, if treatment goals not met, if there is a change in medical status, if patient makes no progress towards goals or if patient is discharged from hospital.  The above assessment, treatment plan, treatment alternatives and goals were discussed and mutually agreed upon: by patient and by family  Janey Genta 05/13/2022, 1:15 PM

## 2022-05-13 NOTE — Progress Notes (Signed)
Patient ID: Rebekah Pope, female   DOB: 09-03-1940, 81 y.o.   MRN: [id number] Met with the patient, spouse and daughter to review current situation, rehab process, team conference and plan of care. Discussed secondary risks including HTN, A-fib (on Xarelto), SSS, HLD (LDL 176/Trig 27) and HF. Discussed medications and dietary modifications along with daily weights. Reviewed toileting and removal of purewick; patient stated an understanding. Continue to follow along to address educational needs to facilitate preparation for discharge. Margarito Liner

## 2022-05-13 NOTE — Patient Care Conference (Signed)
Inpatient RehabilitationTeam Conference and Plan of Care Update Date: 05/13/2022   Time: 11:00 AM    Patient Name: Rebekah Pope      Medical Record Number: [id number]  Date of Birth: 11-30-1940 Sex: Female         Room/Bed: 4W25C/4W25C-01 Payor Info: Payor: [employer] MEDICARE / Plan: [employer] MEDICARE CHOICE PPO / Product Type: *No Product type* /    Admit Date/Time:  05/12/2022  3:15 PM  Primary Diagnosis:  Ischemic cerebrovascular accident (CVA) [facility])  Hospital Problems: Principal Problem:   Ischemic cerebrovascular accident (CVA) [facility])    Expected Discharge Date: Expected Discharge Date: 05/21/22  Team Members Present: Physician leading conference: Dr. Claudette Laws Social Worker Present: Lavera Guise, BSW Nurse Present: Chana Bode, RN PT Present: Casimiro Needle, PT OT Present: Other (comment) Bonnell Public, OT) SLP Present: Eilene Ghazi, SLP PPS Coordinator present : Fae Pippin, SLP     Current Status/Progress Goal Weekly Team Focus  Bowel/Bladder   Continent of B/B. LBM per chart is 05/08/22.   Maintain function, Encourage fluids, promote ambulation.   Assist with Toileting needs    Swallow/Nutrition/ Hydration               ADL's   Evaluation pending            Mobility      Evals pending         Communication                Safety/Cognition/ Behavioral Observations               Pain   Denies pain   Remain pain free   Assess Q4 and prn    Skin   ? Deep Tissue/MASD   Promote wound healing, Prevent further breakdown  Assess QS and prn      Discharge Planning:  New admission. Assesment pending.   Team Discussion: Patient post TAVR September 2023; on Xarelto with ischemic CVA. 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 Expect cognitive issues and ataxia however patient appears cognitively intact with memory issues.  Patient on target to meet rehab goals: yes, currently needs supervision  for bed mobility and completes sit- stand with min assist. Able to complete sit - stand without a device with mod assist and attempted steps upon eval.   *See Care Plan and progress notes for long and short-term goals.   Revisions to Treatment Plan:  N/a   Teaching Needs: Safety, medications, secondary risk management, dietary modifications, transfers, toileting, etc.  Current Barriers to Discharge: Decreased caregiver support and Home enviroment access/layout  Possible Resolutions to Barriers: Family education OP follow up services     Medical Summary Current Status: uncontrolled HTN  Barriers to Discharge: Medical stability;Uncontrolled Hypertension   Possible Resolutions to [employer] Focus: adjusat BP meds , inititae therapy   Continued Need for Acute Rehabilitation Level of Care: The patient requires daily medical management by a physician with specialized training in physical medicine and rehabilitation for the following reasons: Direction of a multidisciplinary physical rehabilitation program to maximize functional independence : Yes Medical management of patient stability for increased activity during participation in an intensive rehabilitation regime.: Yes Analysis of laboratory values and/or radiology reports with any subsequent need for medication adjustment and/or medical intervention. : Yes   I attest that I was present, lead the team conference, and concur with the assessment and plan of the team.   Chana Bode B 05/13/2022, 2:39 PM

## 2022-05-13 NOTE — Progress Notes (Signed)
PROGRESS NOTE   Subjective/Complaints:  C/o double vision, no other issues , can read with eye patch   ROS- neg CP, SOB , N/V/D, + constip   Objective:   ECHOCARDIOGRAM COMPLETE BUBBLE STUDY  Result Date: 05/11/2022    ECHOCARDIOGRAM REPORT   Patient Name:   Rebekah Pope Date of Exam: 05/11/2022 Medical Rec #:  [id number]            Height:       63.0 in Accession #:    [id number]           Weight:       187.0 lb Date of Birth:  08/29/1940             BSA:          1.879 m Patient Age:    81 years             BP:           165/64 mmHg Patient Gender: F                    HR:           48 bpm. Exam Location:  Inpatient Procedure: 2D Echo, Color Doppler and Cardiac Doppler Indications:    Stroke i63.9  History:        Patient has prior history of Echocardiogram examinations, most                 recent 03/11/2022. Arrythmias:Atrial Fibrillation; Risk                 Factors:Dyslipidemia. 26mm Medtronic TAVR Placed 02/10/22.  Sonographer:    Irving BurtonEmily Senior RDCS Referring Phys: [id number] Oliver PilaAROLE N HALL  Sonographer Comments: No IV access for bubble study. IMPRESSIONS  1. Left ventricular ejection fraction, by estimation, is 65 to 70%. The left ventricle has normal function. The left ventricle has no regional wall motion abnormalities. There is mild left ventricular hypertrophy. Left ventricular diastolic parameters are consistent with Grade II diastolic dysfunction (pseudonormalization). Elevated left atrial pressure.  2. Right ventricular systolic function is normal. The right ventricular size is normal. There is normal pulmonary artery systolic pressure.  3. Left atrial size was moderately dilated.  4. Right atrial size was moderately dilated.  5. MV is thickened with mildy resricted motoin. Mean gradient through the valve is 3.5 cm2. (HR 49 bpm) Unchanged from echo on 02/10/22.. Mild mitral valve regurgitation. Moderate to severe mitral annular  calcification.  6. S/p TAVR (26 mm Medtronic CoreValve-Evolut Pro prothesis, procedure date 02/10/22) Mean gradient through the valve is 8 mm Hg. No significant change from echo done 02/10/22.. The aortic valve has been repaired/replaced. Aortic valve regurgitation is not visualized.  7. The inferior vena cava is normal in size with greater than 50% respiratory variability, suggesting right atrial pressure of 3 mmHg. FINDINGS  Left Ventricle: Left ventricular ejection fraction, by estimation, is 65 to 70%. The left ventricle has normal function. The left ventricle has no regional wall motion abnormalities. The left ventricular internal cavity size was normal in size. There is  mild left ventricular hypertrophy. Left ventricular diastolic parameters are consistent  with Grade II diastolic dysfunction (pseudonormalization). Elevated left atrial pressure. Right Ventricle: The right ventricular size is normal. Right vetricular wall thickness was not assessed. Right ventricular systolic function is normal. There is normal pulmonary artery systolic pressure. The tricuspid regurgitant velocity is 2.67 m/s, and with an assumed right atrial pressure of 3 mmHg, the estimated right ventricular systolic pressure is 31.5 mmHg. Left Atrium: Left atrial size was moderately dilated. Right Atrium: Right atrial size was moderately dilated. Pericardium: There is no evidence of pericardial effusion. Mitral Valve: MV is thickened with mildy resricted motoin. Mean gradient through the valve is 3.5 cm2. (HR 49 bpm) Unchanged from echo on 02/10/22. There is mild thickening of the mitral valve leaflet(s). There is mild calcification of the mitral valve leaflet(s). Moderate to severe mitral annular calcification. Mild mitral valve regurgitation. MV peak gradient, 9.5 mmHg. The mean mitral valve gradient is 3.5 mmHg. Tricuspid Valve: The tricuspid valve is normal in structure. Tricuspid valve regurgitation is trivial. Aortic Valve: S/p TAVR (26 mm  Medtronic CoreValve-Evolut Pro prothesis, procedure date 02/10/22) Mean gradient through the valve is 8 mm Hg. No significant change from echo done 02/10/22. The aortic valve has been repaired/replaced. Aortic valve regurgitation is not visualized. Aortic valve mean gradient measures 8.0 mmHg. Aortic valve peak gradient measures 14.8 mmHg. Aortic valve area, by VTI measures 1.87 cm. Pulmonic Valve: The pulmonic valve was normal in structure. Pulmonic valve regurgitation is trivial. Aorta: The aortic root is normal in size and structure. Venous: The inferior vena cava is normal in size with greater than 50% respiratory variability, suggesting right atrial pressure of 3 mmHg. IAS/Shunts: No atrial level shunt detected by color flow Doppler.  LEFT VENTRICLE PLAX 2D LVIDd:         4.95 cm   Diastology LVIDs:         2.90 cm   LV e' medial:    4.68 cm/s LV PW:         1.20 cm   LV E/e' medial:  31.2 LV IVS:        1.30 cm   LV e' lateral:   5.33 cm/s LVOT diam:     1.90 cm   LV E/e' lateral: 27.4 LV SV:         101 LV SV Index:   54 LVOT Area:     2.84 cm  RIGHT VENTRICLE RV S prime:     10.40 cm/s TAPSE (M-mode): 2.4 cm LEFT ATRIUM             Index        RIGHT ATRIUM           Index LA diam:        3.90 cm 2.08 cm/m   RA Area:     22.50 cm LA Vol (A2C):   80.8 ml 43.00 ml/m  RA Volume:   65.70 ml  34.96 ml/m LA Vol (A4C):   72.4 ml 38.53 ml/m LA Biplane Vol: 80.6 ml 42.89 ml/m  AORTIC VALVE AV Area (Vmax):    1.84 cm AV Area (Vmean):   1.99 cm AV Area (VTI):     1.87 cm AV Vmax:           192.50 cm/s AV Vmean:          133.500 cm/s AV VTI:            0.538 m AV Peak Grad:      14.8 mmHg AV Mean Grad:  8.0 mmHg LVOT Vmax:         125.00 cm/s LVOT Vmean:        93.900 cm/s LVOT VTI:          0.355 m LVOT/AV VTI ratio: 0.66  AORTA Ao Root diam: 2.90 cm MITRAL VALVE                TRICUSPID VALVE MV Area (PHT): 2.54 cm     TR Peak grad:   28.5 mmHg MV Area VTI:   1.54 cm     TR Vmax:        267.00 cm/s MV  Peak grad:  9.5 mmHg MV Mean grad:  3.5 mmHg     SHUNTS MV Vmax:       1.54 m/s     Systemic VTI:  0.36 m MV Vmean:      89.8 cm/s    Systemic Diam: 1.90 cm MV Decel Time: 299 msec MV E velocity: 146.00 cm/s MV A velocity: 140.00 cm/s MV E/A ratio:  1.04 Dietrich Pates MD Electronically signed by Dietrich Pates MD Signature Date/Time: 05/11/2022/1:09:27 PM    Final    Recent Labs    05/13/22 0606  WBC 6.1  HGB 11.8*  HCT 35.3*  PLT 186   Recent Labs    05/12/22 0621 05/13/22 0606  NA 138 136  K 4.1 4.1  CL 106 107  CO2 24 23  GLUCOSE 104* 102*  BUN 29* 30*  CREATININE 1.27* 1.34*  CALCIUM 9.0 8.7*    Intake/Output Summary (Last 24 hours) at 05/13/2022 4270 Last data filed at 05/13/2022 0730 Gross per 24 hour  Intake 240 ml  Output --  Net 240 ml     Pressure Injury 05/12/22 Coccyx Left Unstageable - Full thickness tissue loss in which the base of the injury is covered by slough (yellow, tan, gray, green or brown) and/or eschar (tan, brown or black) in the wound bed. (Active)  05/12/22 1600  Location: Coccyx  Location Orientation: Left  Staging: Unstageable - Full thickness tissue loss in which the base of the injury is covered by slough (yellow, tan, gray, green or brown) and/or eschar (tan, brown or black) in the wound bed.  Wound Description (Comments):   Present on Admission: Yes    Physical Exam: Vital Signs Blood pressure (!) 166/55, pulse (!) 44, temperature 98.4 F (36.9 C), temperature source Oral, resp. rate 15, height 5\' 3"  (1.6 m), weight 85.5 kg, SpO2 95 %.  General: No acute distress Mood and affect are appropriate Heart: Regular rate and rhythm no rubs murmurs or extra sounds Lungs: Clear to auscultation, breathing unlabored, no rales or wheezes Abdomen: Positive bowel sounds, soft nontender to palpation, nondistended Extremities: No clubbing, cyanosis, or edema Skin: No evidence of breakdown, no evidence of rash Neurologic: No ptosis , upward and downward gaze  ok, with forward gaze pt c/o vertical diplopia, Left pupil elevated vs right side , left pupil does not adduct past midline  motor strength is 5/5 in bilateral deltoid, bicep, tricep, grip, hip flexor, knee extensors, ankle dorsiflexor and plantar flexor   Musculoskeletal: Full range of motion in all 4 extremities. No joint swelling    Assessment/Plan: 1. Functional deficits which require 3+ hours per day of interdisciplinary therapy in a comprehensive inpatient rehab setting. Physiatrist is providing close team supervision and 24 hour management of active medical problems listed below. Physiatrist and rehab team continue to assess barriers to discharge/monitor patient progress toward functional and  medical goals  Care Tool:  Bathing              Bathing assist       Upper Body Dressing/Undressing Upper body dressing        Upper body assist      Lower Body Dressing/Undressing Lower body dressing            Lower body assist       Toileting Toileting    Toileting assist       Transfers Chair/bed transfer  Transfers assist           Locomotion Ambulation   Ambulation assist              Walk 10 feet activity   Assist           Walk 50 feet activity   Assist           Walk 150 feet activity   Assist           Walk 10 feet on uneven surface  activity   Assist           Wheelchair     Assist               Wheelchair 50 feet with 2 turns activity    Assist            Wheelchair 150 feet activity     Assist          Blood pressure (!) 166/55, pulse (!) 44, temperature 98.4 F (36.9 C), temperature source Oral, resp. rate 15, height 5\' 3"  (1.6 m), weight 85.5 kg, SpO2 95 %.  Medical Problem List and Plan: 1. 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  Antithrombotics: -DVT/anticoagulation:  Pharmaceutical: Xarelto             -antiplatelet therapy: N/A 3. Pain Management: Oxycodone as needed 4. Mood/Behavior/Sleep: Trazodone 50 mg nightly as needed             -antipsychotic agents: N/A 5. Neuropsych/cognition: This patient is capable of making decisions on her own behalf. 6. Skin/Wound Care: Routine skin checks 7. Fluids/Electrolytes/Nutrition: Routine in and outs with follow-up chemistries 8.  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  PAF/pacemaker.  Continue chronic Xarelto as well as amiodarone 100 mg daily.  Cardiac rate controlled.  Follow-up outpatient Dr. 04/12/2022             -plan is for outpatient pacemaker placement 10.  Hypothyroidism.  Synthroid 11.  CKD.  Creatinine baseline 1.20-1.30.  Follow-up chemistries 12.  Diastolic congestive heart failure.  Monitor for any signs of fluid overload\             -daily weights 13.  Hyperlipidemia.  Lipitor    LOS: 1 days A FACE TO FACE EVALUATION WAS PERFORMED  Graciela Husbands 05/13/2022, 9:06 AM

## 2022-05-13 NOTE — Progress Notes (Signed)
Inpatient Rehabilitation  Patient information reviewed and entered into eRehab system by Anson Peddie M. Latressa Harries, M.A., CCC/SLP, PPS Coordinator.  Information including medical coding, functional ability and quality indicators will be reviewed and updated through discharge.    

## 2022-05-13 NOTE — Discharge Instructions (Addendum)
Inpatient Rehab Discharge Instructions  Darleen Moffitt Discharge date and time: No discharge date for patient encounter.   Activities/Precautions/ Functional Status: Activity: activity as tolerated Diet: regular diet Wound Care: Routine skin checks Functional status:  ___ No restrictions     ___ Walk up steps independently ___ 24/7 supervision/assistance   ___ Walk up steps with assistance ___ Intermittent supervision/assistance  ___ Bathe/dress independently ___ Walk with walker     _x__ Bathe/dress with assistance ___ Walk Independently    ___ Shower independently ___ Walk with assistance    ___ Shower with assistance ___ No alcohol     ___ Return to work/school ________  COMMUNITY REFERRALS UPON DISCHARGE:    Home Health:   PT     OT     RN                      Agency: [facility]  Phone: [phone]     Special Instructions: No driving smoking or alcoholSTROKE/TIA DISCHARGE INSTRUCTIONS SMOKING Cigarette smoking nearly doubles your risk of having a stroke & is the single most alterable risk factor  If you smoke or have smoked in the last 12 months, you are advised to quit smoking for your health. Most of the excess cardiovascular risk related to smoking disappears within a year of stopping. Ask you doctor about anti-smoking medications [place] Quit Line: 1-800-QUIT NOW Free Smoking Cessation Classes [phone]  CHOLESTEROL Know your levels; limit fat & cholesterol in your diet  Lipid Panel     Component Value Date/Time   CHOL 268 (H) 05/10/2022 0511   TRIG 27 05/10/2022 0511   HDL 87 05/10/2022 0511   CHOLHDL 3.1 05/10/2022 0511   VLDL 5 05/10/2022 0511   LDLCALC 176 (H) 05/10/2022 0511     Many patients benefit from treatment even if their cholesterol is at goal. Goal: Total Cholesterol (CHOL) less than 160 Goal:  Triglycerides (TRIG) less than 150 Goal:  HDL greater than 40 Goal:  LDL (LDLCALC) less than 100   BLOOD PRESSURE American Stroke Association  blood pressure target is less that 120/80 mm/Hg  Your discharge blood pressure is:  BP: (!) 186/59 Monitor your blood pressure Limit your salt and alcohol intake Many individuals will require more than one medication for high blood pressure  DIABETES (A1c is a blood sugar average for last 3 months) Goal HGBA1c is under 7% (HBGA1c is blood sugar average for last 3 months)  Diabetes: No known diagnosis of diabetes    Lab Results  Component Value Date   HGBA1C 6.0 (H) 05/10/2022    Your HGBA1c can be lowered with medications, healthy diet, and exercise. Check your blood sugar as directed by your physician Call your physician if you experience unexplained or low blood sugars.  PHYSICAL ACTIVITY/REHABILITATION Goal is 30 minutes at least 4 days per week  Activity: Increase activity slowly, Therapies: Physical Therapy: Home Health Return to work:  Activity decreases your risk of heart attack and stroke and makes your heart stronger.  It helps control your weight and blood pressure; helps you relax and can improve your mood. Participate in a regular exercise program. Talk with your doctor about the best form of exercise for you (dancing, walking, swimming, cycling).  DIET/WEIGHT Goal is to maintain a healthy weight  Your discharge diet is:  Diet Order             Diet Heart Room service appropriate? Yes; Fluid consistency: Thin  Diet  effective now                   liquids Your height is:    Your current weight is: Weight: 85.5 kg Your Body Mass Index (BMI) is:  BMI (Calculated): 33.4 Following the type of diet specifically designed for you will help prevent another stroke. Your goal weight range is:   Your goal Body Mass Index (BMI) is 19-24. Healthy food habits can help reduce 3 risk factors for stroke:  High cholesterol, hypertension, and excess weight.  RESOURCES Stroke/Support Group:  Call [phone]   STROKE EDUCATION PROVIDED/REVIEWED AND GIVEN TO PATIENT Stroke warning  signs and symptoms How to activate emergency medical system (call 911). Medications prescribed at discharge. Need for follow-up after discharge. Personal risk factors for stroke. Pneumonia vaccine given: No Flu vaccine given: No My questions have been answered, the writing is legible, and I understand these instructions.  I will adhere to these goals & educational materials that have been provided to me after my discharge from the hospital.      My questions have been answered and I understand these instructions. I will adhere to these goals and the provided educational materials after my discharge from the hospital.  Patient/Caregiver Signature _______________________________ Date __________  Clinician Signature _______________________________________ Date __________  Please bring this form and your medication list with you to all your follow-up doctor's appointments.

## 2022-05-14 ENCOUNTER — Encounter: Payer: Self-pay | Admitting: Cardiology

## 2022-05-14 DIAGNOSIS — I639 Cerebral infarction, unspecified: Secondary | ICD-10-CM | POA: Diagnosis not present

## 2022-05-14 NOTE — Progress Notes (Signed)
Occupational Therapy Session Note  Patient Details  Name: Rebekah Pope MRN: [id number] Date of Birth: 1940-09-28  Today's Date: 05/14/2022 OT Individual Time: 0900-1000 OT Individual Time Calculation (min): 60 min    Short Term Goals: Week 1:  OT Short Term Goal 1 (Week 1): Pt will visually scan within R and L visual fields to identify items needed for grooming tasks with min verbal cues OT Short Term Goal 2 (Week 1): Pt will utilize compensatory strategies for diplopia during functional tasks with min A OT Short Term Goal 3 (Week 1): Pt will complete LB dressing CGA  Skilled Therapeutic Interventions/Progress Updates:   OT session focussed on a more thorough visual assessment as related to scope of OT conducted. Pt eager for session and up in w/c with all AM routine already conducted with NT sink side. Start of session, pt described vision, issues, deficits, changes, improvements noted from her perspective. Reports acute OT taped her glasses lens but pt took it off but does not know why and began to use patch. OT educated on reasoning behind allowing taping vs patching to let light into eye, allow for visual field use and operational eye use. Patch occludes and does not allow for any function. Pt demonstrated understanding. Pt instructed in how to test for dominant eye and pt is Left eye dominant with testing. OT assessed acuity, VF's via confrontation, eye alignment, pursuits, saccades and convergence as well as depth perception. Pt reports diplopia is now more intermittent and at times- especially in the am- pt is not seeing double but images separated but stacked somewhat revealing perception improving. Pt agreeable to lens taping both her distance prescription glasses and clear glasses for reading. Diplopia completely resolved with all function including small print and distal image gae in all planes.  OT educated to ensure lenses sit on bridge of nose and do not slide not should pt look  aside the taping and maintain lens tape directly central. OT wrote out all instructions and rationale. Pt then instructed in ROM to address alignment and convergence as well as [place] Amabile string exercises introduced. Pt to remove glasses for visual exercises but use lens taping for all other activity. OT will schedule with pt a follow up to ensure any issues and next steps in treatment. OT care coord with primary OT. Left pt at end of session with chair alarm set, needs and call bell within reach.   Therapy Documentation Precautions:  Precautions Precautions: Fall, Other (comment) Precaution Comments: diplopia, dysconjugate gaze Restrictions Weight Bearing Restrictions: No   Therapy/Group: Individual Therapy  Vicenta Dunning 05/14/2022, 7:34 AM

## 2022-05-14 NOTE — Progress Notes (Signed)
Occupational Therapy Session Note  Patient Details  Name: Rebekah Pope MRN: [id number] Date of Birth: 16-Dec-1940  Today's Date: 05/14/2022 OT Individual Time: 1430-1530 OT Individual Time Calculation (min): 60 min    Short Term Goals: Week 1:  OT Short Term Goal 1 (Week 1): Pt will visually scan within R and L visual fields to identify items needed for grooming tasks with min verbal cues OT Short Term Goal 2 (Week 1): Pt will utilize compensatory strategies for diplopia during functional tasks with min A OT Short Term Goal 3 (Week 1): Pt will complete LB dressing CGA  Skilled Therapeutic Interventions/Progress Updates:     Pt received resting in wc with family present in room in good spirits and receptive to skilled OT session. Pt reporting 0/10 pain.   Pt and family inquiring about previous vision evaluation from morning OT session. Pt and family provided education for reasoning behind taping non-dominant eye and purpose of vision exercises. Pt instructed to read aloud written  visual protocol provided from OT during AM session. Pt able to read protocol and explain her current visual deficits, reasoning behind taping strategy/exercises, and treatment/compensations for visual deficits. Pt demonstrated exercises to OT with mod A for technique when completing circular visual tracking exercises. Pt and family receptive of skilled education.   Pt wearing glasses with medial portion of glasses taped on R lense to increase lateralization of R eye throughout session. Pt completed sit>stand using RW CGA mod cues for hand placement and body mechanics provided. Pt instructed to ambulate down hallway using RW and visually scan from R to L side to read room numbers. Pt able to complete task with increased time and min verbal cues to identify missed room #.   Pt completed seated card flipping/matching game while seated at table to address convergence and visual scanning deficits. Pt completed task  with 100% accuracy reporting double vision ~50% of the time with increase in double vision when holding cards further from her face.   Pt completed seated visual motor peg activity at table top. Medium sized pegs presented to Pt R and L superior and inferior visual fields with Pt instructed to grab peg and place it in peg board using her RUE. Pt noted to undershoot when reaching for pegs with increase R>L visual field. Pt then removed pegs and placed them back into container presented to Pt R superior field with Pt noted to miss container d/t undershooting ~25% of the time. Pt reported double vision ~50% of the time during task.   Pt ambulated back to her room CGA using RW and was left resting in her wc with call bell in reach, chair alarm on, family present in room, and all needs met.   Therapy Documentation Precautions:  Precautions Precautions: Fall, Other (comment) Precaution Comments: diplopia, dysconjugate gaze Restrictions Weight Bearing Restrictions: No  Pain: Pain Assessment Pain Scale: 0-10 Pain Score: 0-No pain ADL: ADL Eating: Supervision/safety Where Assessed-Eating: Edge of bed Grooming: Setup Where Assessed-Grooming: Edge of bed Upper Body Bathing: Supervision/safety Where Assessed-Upper Body Bathing: Shower Lower Body Bathing: Contact guard Where Assessed-Lower Body Bathing: Shower Upper Body Dressing: Supervision/safety Where Assessed-Upper Body Dressing: Edge of bed Lower Body Dressing: Minimal assistance Where Assessed-Lower Body Dressing: Edge of bed Toileting: Contact guard Where Assessed-Toileting: Glass blower/designer: Therapist, music Method: Counselling psychologist: Energy manager: Curator Method: Heritage manager: Civil engineer, contracting with back   Therapy/Group: Individual Therapy  Janey Genta 05/14/2022, 3:06 PM

## 2022-05-14 NOTE — Progress Notes (Signed)
Inpatient Rehabilitation Care Coordinator Assessment and Plan Patient Details  Name: Leanette Eutsler MRN: [id number] Date of Birth: 10-20-1940  Today's Date: 05/14/2022  Hospital Problems: Principal Problem:   Ischemic cerebrovascular accident (CVA) [facility])  Past Medical History:  Past Medical History:  Diagnosis Date   Allergic rhinitis    Complex regional pain syndrome i of unspecified lower limb    Dizziness and giddiness    Essential (primary) hypertension    History of hiatal hernia    Hyperlipidemia    Hypothyroidism    Localized edema    Paroxysmal atrial fibrillation (HCC)    PONV (postoperative nausea and vomiting)    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 Burt Knack & Dr. Cyndia Bent   Severe aortic stenosis    Past Surgical History:  Past Surgical History:  Procedure Laterality Date   CESAREAN SECTION     INTRAOPERATIVE TRANSTHORACIC ECHOCARDIOGRAM N/A 02/10/2022   Procedure: INTRAOPERATIVE TRANSTHORACIC ECHOCARDIOGRAM;  Surgeon: Sherren Mocha, MD;  Location: [place] [facility];  Service: Open Heart Surgery;  Laterality: N/A;   RIGHT HEART CATH AND CORONARY ANGIOGRAPHY N/A 10/31/2021   Procedure: RIGHT HEART CATH AND CORONARY ANGIOGRAPHY;  Surgeon: Burnell Blanks, MD;  Location: [place] [facility];  Service: Cardiovascular;  Laterality: N/A;   TRANSCATHETER AORTIC VALVE REPLACEMENT, TRANSFEMORAL N/A 02/10/2022   Procedure: Transcatheter Aortic Valve Replacement, Transfemoral;  Surgeon: Sherren Mocha, MD;  Location: [place] [facility];  Service: Open Heart Surgery;  Laterality: N/A;   Social History:  reports that she has never smoked. She has been exposed to tobacco smoke. She has never used smokeless tobacco. She reports that she does not drink alcohol and does not use drugs.  Family / Support Systems Marital Status: Married Patient Roles: Spouse Spouse/Significant Other:  Richard Children: Administrator, sports (dtr) and Doctor, general practice (son) Anticipated Caregiver: Tyson Dense Ability/Limitations of Caregiver: Light MIN A Caregiver Availability: 24/7 Family Dynamics: support from spouse and children  Social History Preferred language: English Religion:  Health Literacy - How often do you need to have someone help you when you read instructions, pamphlets, or other written material from your doctor or pharmacy?: Never Writes: Yes   Abuse/Neglect Abuse/Neglect Assessment Can Be Completed: Yes Physical Abuse: Denies Verbal Abuse: Denies Sexual Abuse: Denies Exploitation of patient/patient's resources: Denies Self-Neglect: Denies  Patient response to: Social Isolation - How often do you feel lonely or isolated from those around you?: Never  Emotional Status Recent Psychosocial Issues: coping  Patient / Family Perceptions, Expectations & Goals Pt/Family understanding of illness & functional limitations: yes Premorbid pt/family roles/activities: Previously independent overall Anticipated changes in roles/activities/participation: Anticipating supervision. Spouse able to provide light assistance Pt/family expectations/goals: [place]: None Premorbid Home Care/DME Agencies: Other (Comment) ([facility] and [facility]) Is the patient able to respond to transportation needs?: Yes In the past 12 months, has lack of transportation kept you from medical appointments or from getting medications?: No In the past 12 months, has lack of transportation kept you from meetings, work, or from getting things needed for daily living?: No Resource referrals recommended: Neuropsychology  Discharge Planning Living Arrangements: Spouse/significant other Support Systems: Spouse/significant other Type of Residence: Private residence Insurance Resources: Multimedia programmer (specify) ([place]) Financial Resources: Social Security, [place]  Referred: No Living Expenses: Own Money Management: Patient, Spouse Does the patient have any problems obtaining your medications?: No Home Management: Independent Patient/Family  Preliminary Plans: Spouse able to assist with cogntive tasks Care Coordinator Barriers to Discharge: Insurance for SNF coverage, Decreased caregiver support, Lack of/limited family support, Home environment access/layout Care Coordinator Barriers to Discharge Comments: Patient has stair lift to basement Care Coordinator Anticipated Follow Up Needs: HH/OP Expected length of stay: 5-7 Days  Clinical Impression Sw met with patient, spouse and son, introduced self and explained role. Patient plans to discharge home with light assistance from spouse. Patient anticipated discharge on 12-14. No additional questions or concerns.  [name] 05/14/2022, 1:50 PM

## 2022-05-14 NOTE — Progress Notes (Signed)
Physical Therapy Session Note  Patient Details  Name: Rebekah Pope MRN: [id number] Date of Birth: [birth year]  Today's Date: 05/14/2022 PT Individual Time: 9629-5284 PT Individual Time Calculation (min): 41 min   Short Term Goals: Week 1:  PT Short Term Goal 1 (Week 1): = to LTGs based on ELOS  Skilled Therapeutic Interventions/Progress Updates:    Pt received sitting in w/c with her husband and daughter present. Pt agreeable to therapy session. Pt's daughter inquiring for an update regarding pt's cardiologist appointment - Jesusita Oka, PA reported he would reach out to cardiologist so daughter made aware of this and therapist sent secure chat requesting PA update pt's daughter when possible.  Transported to/from gym in w/c for time management and energy conservation.  Pt wearing taping on medial R eye glasses throughout session.  Sit<>stands, no AD, with light min assist for balance throughout session. Gait training ~253ft, no AD, but pt supporting her R arm up against therapist for constant min assist progressed to truly no UE support and requiring light min assist to maintain balance throughout - pt reports feeling unstable throughout with 1x minor L anterior LOB and overall with with slight instability noted primarily during L stance phase.  Dynamic stepping balance task of alternating foot taps on 4" step with L HHA progressed to no UE support with continuous min assist for balance due to R/L and posterior postural sway/minor LOB - provided visual targets on the step to challenge pt's visual impairments of correctly locating the dots and pt did it 100% of the time - noted to have greatest difficulty maintaining L weight shift onto L stance.  Gait training additional ~162ft, no UE support, with light min assist for balance - pt tends to veer towards L and have slight L lateral trunk tilt, responds well to verbal and tactile cuing to correct.   Transported back to her room and pt left seated  in w/c with needs in reach and seat belt alarm on.  Therapy Documentation Precautions:  Precautions Precautions: Fall, Other (comment) Precaution Comments: diplopia, dysconjugate gaze Restrictions Weight Bearing Restrictions: No   Pain: Denies pain during session.    Therapy/Group: Individual Therapy  Ginny Forth , PT, DPT, NCS, CSRS 05/14/2022, 3:30 PM

## 2022-05-14 NOTE — IPOC Note (Signed)
Overall Plan of Care [facility]) Patient Details Name: Rebekah Pope MRN: [id number] DOB: Dec 04, 1940  Admitting Diagnosis: Ischemic cerebrovascular accident (CVA) [facility])  Hospital Problems: Principal Problem:   Ischemic cerebrovascular accident (CVA) [facility])     Functional Problem List: Nursing Bowel, Bladder, Endurance, Medication Management, Safety  PT Balance, Perception, Safety, Skin Integrity, Endurance, Motor, Nutrition, Pain  OT Balance, Perception, Safety, Cognition, Endurance, Motor, Vision  SLP    TR         Basic ADL's: OT Grooming, Bathing, Dressing, Toileting     Advanced  ADL's: OT Simple Meal Preparation, Light Housekeeping     Transfers: PT Bed Mobility, Bed to Chair, Car, Lobbyist, Technical brewer: PT Ambulation, Stairs     Additional Impairments: OT    SLP        TR      Anticipated Outcomes Item Anticipated Outcome  Self Feeding Independent  Swallowing      Basic self-care  Supervision  Toileting  Supervision   Bathroom Transfers Supervsion  Bowel/Bladder  Manage bowel w mod I and bladder w toileting  Transfers  supervision using LRAD  Locomotion  supervision using LRAD  Communication     Cognition     Pain  n/a  Safety/Judgment  manage w cues   Therapy Plan: PT Intensity: Minimum of 1-2 x/day ,45 to 90 minutes PT Frequency: 5 out of 7 days PT Duration Estimated Length of Stay: 7-10 days OT Intensity: Minimum of 1-2 x/day, 45 to 90 minutes OT Frequency: 5 out of 7 days OT Duration/Estimated Length of Stay: 7-10 days     Team Interventions: Nursing Interventions Patient/Family Education, Disease Management/Prevention, Discharge Planning, Bladder Management, Bowel Management, Medication Management  PT interventions Ambulation/gait training, Community reintegration, DME/adaptive equipment instruction, Neuromuscular re-education, Psychosocial support, Stair training, UE/LE Strength taining/ROM,  Warden/ranger, Discharge planning, Functional electrical stimulation, Pain management, Skin care/wound management, Therapeutic Activities, UE/LE Coordination activities, Cognitive remediation/compensation, Disease management/prevention, Functional mobility training, Splinting/orthotics, Patient/family education, Therapeutic Exercise, Visual/perceptual remediation/compensation  OT Interventions Balance/vestibular training, Discharge planning, Self Care/advanced ADL retraining, Therapeutic Activities, UE/LE Coordination activities, Pain management, Cognitive remediation/compensation, Functional mobility training, Patient/family education, Therapeutic Exercise, Visual/perceptual remediation/compensation, Community reintegration, Fish farm manager, Neuromuscular re-education, Psychosocial support, UE/LE Strength taining/ROM  SLP Interventions    TR Interventions    SW/CM Interventions     Barriers to Discharge MD  Medical stability  Nursing Decreased caregiver support, Home environment access/layout 1 level 1 ste ; stair lift to basement laundry w spouse  PT Incontinence, Decreased caregiver support    OT      SLP      SW       Team Discharge Planning: Destination: PT-Home ,OT- Home , SLP-  Projected Follow-up: PT-Outpatient PT, 24 hour supervision/assistance, OT-  Outpatient OT, SLP-  Projected Equipment Needs: PT-To be determined, OT- To be determined, SLP-  Equipment Details: PT- , OT-  Patient/family involved in discharge planning: PT- Patient,  OT-Patient, Family member/caregiver, SLP-   MD ELOS: 7-10d Medical Rehab Prognosis:  Excellent Assessment: The patient has been admitted for CIR therapies with the diagnosis of ischemic CVA. The team will be addressing functional mobility, strength, stamina, balance, safety, adaptive techniques and equipment, self-care, bowel and bladder mgt, patient and caregiver education, diplopia, monitor CHF during exercise.  Goals have been set at [facility]. Anticipated discharge destination is home .        See Team Conference Notes for weekly updates to the plan of  care

## 2022-05-14 NOTE — Progress Notes (Signed)
Slept well last night. Denies pain. Occasional visit to bathroom with assistance. Mostly stable with rolling walker. BP monitored often. Denies dizziness. Safety maintained.

## 2022-05-14 NOTE — Progress Notes (Signed)
PROGRESS NOTE   Subjective/Complaints:  Discussed d/c date, pt is pleased   ROS- neg CP, SOB , N/V/D, + constip   Objective:   No results found. Recent Labs    05/13/22 0606  WBC 6.1  HGB 11.8*  HCT 35.3*  PLT 186    Recent Labs    05/12/22 0621 05/13/22 0606  NA 138 136  K 4.1 4.1  CL 106 107  CO2 24 23  GLUCOSE 104* 102*  BUN 29* 30*  CREATININE 1.27* 1.34*  CALCIUM 9.0 8.7*     Intake/Output Summary (Last 24 hours) at 05/14/2022 4259 Last data filed at 05/13/2022 1323 Gross per 24 hour  Intake 177 ml  Output --  Net 177 ml      Pressure Injury 05/12/22 Coccyx Left Deep Tissue Pressure Injury - Purple or maroon localized area of discolored intact skin or blood-filled blister due to damage of underlying soft tissue from pressure and/or shear. (Active)  05/12/22 1600  Location: Coccyx  Location Orientation: Left  Staging: Deep Tissue Pressure Injury - Purple or maroon localized area of discolored intact skin or blood-filled blister due to damage of underlying soft tissue from pressure and/or shear.  Wound Description (Comments):   Present on Admission: Yes    Physical Exam: Vital Signs Blood pressure (!) 151/48, pulse (!) 46, temperature 98.6 F (37 C), resp. rate 15, height 5\' 3"  (1.6 m), weight 85.5 kg, SpO2 98 %.  General: No acute distress Mood and affect are appropriate Heart: Regular rate and rhythm no rubs murmurs or extra sounds Lungs: Clear to auscultation, breathing unlabored, no rales or wheezes Abdomen: Positive bowel sounds, soft nontender to palpation, nondistended Extremities: No clubbing, cyanosis, or edema Skin: No evidence of breakdown, no evidence of rash Neurologic: No ptosis , upward and downward gaze ok, with forward gaze pt c/o vertical diplopia, Left pupil elevated vs right side , left pupil does not adduct past midline  motor strength is 5/5 in bilateral deltoid, bicep,  tricep, grip, hip flexor, knee extensors, ankle dorsiflexor and plantar flexor Cerebellar, no dysmetria FNF  Musculoskeletal: Full range of motion in all 4 extremities. No joint swelling    Assessment/Plan: 1. Functional deficits which require 3+ hours per day of interdisciplinary therapy in a comprehensive inpatient rehab setting. Physiatrist is providing close team supervision and 24 hour management of active medical problems listed below. Physiatrist and rehab team continue to assess barriers to discharge/monitor patient progress toward functional and medical goals  Care Tool:  Bathing    Body parts bathed by patient: Right arm, Left arm, Chest, Abdomen, Front perineal area, Right lower leg, Left upper leg, Right upper leg, Buttocks, Left lower leg, Face         Bathing assist Assist Level: Contact Guard/Touching assist     Upper Body Dressing/Undressing Upper body dressing   What is the patient wearing?: Pull over shirt    Upper body assist Assist Level: Supervision/Verbal cueing    Lower Body Dressing/Undressing Lower body dressing      What is the patient wearing?: Underwear/pull up, Pants     Lower body assist Assist for lower body dressing: Minimal Assistance - Patient >  75%     Toileting Toileting    Toileting assist Assist for toileting: Contact Guard/Touching assist     Transfers Chair/bed transfer  Transfers assist     Chair/bed transfer assist level: Minimal Assistance - Patient > 75%     Locomotion Ambulation   Ambulation assist      Assist level: Moderate Assistance - Patient 50 - 74% Assistive device: Hand held assist Max distance: 196ft   Walk 10 feet activity   Assist     Assist level: Minimal Assistance - Patient > 75% Assistive device: Hand held assist   Walk 50 feet activity   Assist    Assist level: Moderate Assistance - Patient - 50 - 74% Assistive device: Hand held assist    Walk 150 feet activity   Assist     Assist level: Moderate Assistance - Patient - 50 - 74% Assistive device: Hand held assist    Walk 10 feet on uneven surface  activity   Assist     Assist level: Minimal Assistance - Patient > 75% Assistive device: Hand held assist, Other (comment) (railing)   Wheelchair     Assist Is the patient using a wheelchair?: Yes (only for time management) Type of Wheelchair: Manual    Wheelchair assist level: Dependent - Patient 0%      Wheelchair 50 feet with 2 turns activity    Assist        Assist Level: Dependent - Patient 0%   Wheelchair 150 feet activity     Assist      Assist Level: Dependent - Patient 0%   Blood pressure (!) 151/48, pulse (!) 46, temperature 98.6 F (37 C), resp. rate 15, height 5\' 3"  (1.6 m), weight 85.5 kg, SpO2 98 %.  Medical Problem List and Plan: 1. 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  Antithrombotics: -DVT/anticoagulation:  Pharmaceutical: Xarelto             -antiplatelet therapy: N/A 3. Pain Management: Oxycodone as needed 4. Mood/Behavior/Sleep: Trazodone 50 mg nightly as needed             -antipsychotic agents: N/A 5. Neuropsych/cognition: This patient is capable of making decisions on her own behalf. 6. Skin/Wound Care: Routine skin checks 7. Fluids/Electrolytes/Nutrition: Routine in and outs with follow-up chemistries 8.  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  PAF/pacemaker.  Continue chronic Xarelto as well as amiodarone 100 mg daily.  Cardiac rate controlled.  Follow-up outpatient Dr. 04/12/2022             -plan is  for outpatient pacemaker placement 10.  Hypothyroidism.  Synthroid 11.  CKD.  Creatinine baseline 1.20-1.30.  Follow-up chemistries    Latest Ref Rng & Units 05/13/2022    6:06 AM 05/12/2022    6:21 AM 05/10/2022    6:12 AM  BMP  Glucose 70 - 99 mg/dL 14/08/2021  093  235   BUN 8 - 23 mg/dL 30  29  31    Creatinine  0.44 - 1.00 mg/dL 1.61  0.96  0.45   Sodium 135 - 145 mmol/L 136  138  139   Potassium 3.5 - 5.1 mmol/L 4.1  4.1  4.2   Chloride 98 - 111 mmol/L 107  106  106   CO2 22 - 32 mmol/L 23  24    Calcium 8.9 - 10.3 mg/dL 8.7  9.0     12.  Diastolic congestive heart failure.  Monitor for any signs of fluid overload\             -daily weights 13.  Hyperlipidemia.  Lipitor    LOS: 2 days A FACE TO FACE EVALUATION WAS PERFORMED  Erick Colace 05/14/2022, 8:07 AM

## 2022-05-14 NOTE — Evaluation (Signed)
Speech Language Pathology Assessment and Plan  Patient Details  Name: Rebekah Pope MRN: [id number] Date of Birth: 11-23-1940  SLP Diagnosis: N/A Rehab Potential: N/A ELOS: N/A   Today's Date: 05/14/2022 SLP Individual Time: 9024-0973 SLP Individual Time Calculation (min): 25 min   Hospital Problem: Principal Problem:   Ischemic cerebrovascular accident (CVA) ([place])  Past Medical History:  Past Medical History:  Diagnosis Date   Allergic rhinitis    Complex regional pain syndrome i of unspecified lower limb    Dizziness and giddiness    Essential (primary) hypertension    History of hiatal hernia    Hyperlipidemia    Hypothyroidism    Localized edema    Paroxysmal atrial fibrillation (HCC)    PONV (postoperative nausea and vomiting)    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 Burt Knack & Dr. Cyndia Bent   Severe aortic stenosis    Past Surgical History:  Past Surgical History:  Procedure Laterality Date   CESAREAN SECTION     INTRAOPERATIVE TRANSTHORACIC ECHOCARDIOGRAM N/A 02/10/2022   Procedure: INTRAOPERATIVE TRANSTHORACIC ECHOCARDIOGRAM;  Surgeon: Sherren Mocha, MD;  Location: [place] [facility];  Service: Open Heart Surgery;  Laterality: N/A;   RIGHT HEART CATH AND CORONARY ANGIOGRAPHY N/A 10/31/2021   Procedure: RIGHT HEART CATH AND CORONARY ANGIOGRAPHY;  Surgeon: Burnell Blanks, MD;  Location: [place] [facility];  Service: Cardiovascular;  Laterality: N/A;   TRANSCATHETER AORTIC VALVE REPLACEMENT, TRANSFEMORAL N/A 02/10/2022   Procedure: Transcatheter Aortic Valve Replacement, Transfemoral;  Surgeon: Sherren Mocha, MD;  Location: [place] [facility];  Service: Open Heart Surgery;  Laterality: N/A;    Assessment / Plan / Recommendation Clinical Impression Patient is a 81 y.o. year old right-handed female with history of CKD, diastolic congestive heart failure, hyperlipidemia, LBBB,  PAF/aortic stenosis status post TAVR on 02/10/2022 as well as sick sinus rhythm with pacemaker maintained on Xarelto as well as amiodarone.   Presented 05/10/2022 with sudden onset of nausea, dizziness/double vision and weakness with emesis.  MRI showed scattered tiny acute to subacute infarcts suggesting central embolic disease.  Plan now is for pacemaker placement as outpatient. Tolerating a regular consistency diet. Pt with diplopia and depth perception/balance deficits. Therapy evaluations completed with recommendations for a comprehensive rehab program. Patient admitted 05/12/2022. PT/OT noted cognitive deficits throughout initial evaluation, therefore, SLP was consulted to assess patient's overall cognitive-linguistic functioning.   Upon arrival, patient was awake while upright in wheelchair. Patient was oriented X 4 and reported that she feels she is at her cognitive-linguistic baseline. SLP administered the Cognistat and patient scored WFL on all subtests. Patient also participated in a complex and functional cognitive task in which she had to organize and balance a checkbook (patient did at baseline). Patient performed task independently. Patient also recalled events from previous therapy sessions, therapists names, and details regarding current exercise programs. Overall, patient's cognitive-linguistic functioning appeared to be at baseline, therefore, skilled SLP intervention is not warranted at this time. Patient verbalized understanding and agreement.    Skilled Therapeutic Interventions          Administered a cognitive-linguistic evaluation, please see above for details.   SLP Assessment  Patient does not need any further Speech Lanaguage Pathology Services    Recommendations  Oral Care Recommendations: Oral care BID Patient destination: Home Follow up Recommendations: None Equipment Recommended: None recommended by SLP    SLP Frequency N/A  SLP  Duration  SLP Intensity  SLP  Treatment/Interventions N/A  N/A  N/A   Pain Pain Assessment Pain Scale: 0-10 Pain Score: 0-No pain  SLP Evaluation Cognition Overall Cognitive Status: Within Functional Limits for tasks assessed Arousal/Alertness: Awake/alert Orientation Level: Oriented X4 Focused Attention: Appears intact Sustained Attention: Appears intact Memory: Appears intact Problem Solving: Appears intact Self Monitoring: Appears intact Safety/Judgment: Appears intact  Comprehension Auditory Comprehension Overall Auditory Comprehension: Appears within functional limits for tasks assessed Expression Expression Primary Mode of Expression: Verbal Verbal Expression Overall Verbal Expression: Appears within functional limits for tasks assessed Oral Motor Oral Motor/Sensory Function Overall Oral Motor/Sensory Function: Within functional limits Motor Speech Overall Motor Speech: Appears within functional limits for tasks assessed  Care Tool Care Tool Cognition Ability to hear (with hearing aid or hearing appliances if normally used Ability to hear (with hearing aid or hearing appliances if normally used): 0. Adequate - no difficulty in normal conservation, social interaction, listening to TV   Expression of Ideas and Wants Expression of Ideas and Wants: 4. Without difficulty (complex and basic) - expresses complex messages without difficulty and with speech that is clear and easy to understand   Understanding Verbal and Non-Verbal Content Understanding Verbal and Non-Verbal Content: 4. Understands (complex and basic) - clear comprehension without cues or repetitions  Memory/Recall Ability Memory/Recall Ability : Current season;Staff names and faces;That he or she is in a hospital/hospital unit;Location of own room    Short Term Goals: N/A   Refer to Care Plan for Long Term Goals  Recommendations for other services: None   Discharge Criteria: Patient will be discharged from SLP if patient refuses  treatment 3 consecutive times without medical reason, if treatment goals not met, if there is a change in medical status, if patient makes no progress towards goals or if patient is discharged from hospital.  The above assessment, treatment plan, treatment alternatives and goals were discussed and mutually agreed upon: by patient and by family  Lequisha Cammack 05/14/2022, 3:50 PM

## 2022-05-15 DIAGNOSIS — I639 Cerebral infarction, unspecified: Secondary | ICD-10-CM | POA: Diagnosis not present

## 2022-05-15 MED ORDER — AMLODIPINE BESYLATE 10 MG PO TABS
10.0000 mg | ORAL_TABLET | Freq: Every day | ORAL | Status: DC
  Administered 2022-05-15 – 2022-05-21 (×7): 10 mg via ORAL
  Filled 2022-05-15 (×7): qty 1

## 2022-05-15 MED ORDER — HYDRALAZINE HCL 25 MG PO TABS
25.0000 mg | ORAL_TABLET | Freq: Three times a day (TID) | ORAL | Status: DC
  Administered 2022-05-15 – 2022-05-21 (×17): 25 mg via ORAL
  Filled 2022-05-15 (×19): qty 1

## 2022-05-15 MED ORDER — SACUBITRIL-VALSARTAN 24-26 MG PO TABS
1.0000 | ORAL_TABLET | Freq: Two times a day (BID) | ORAL | Status: DC
  Administered 2022-05-15 – 2022-05-21 (×13): 1 via ORAL
  Filled 2022-05-15 (×13): qty 1

## 2022-05-15 NOTE — Progress Notes (Signed)
Physical Therapy Session Note  Patient Details  Name: Rebekah Pope MRN: [id number] Date of Birth: [birth year]  Today's Date: 05/15/2022 PT Individual Time: 1000-1115 PT Individual Time Calculation (min): 75 min   Short Term Goals: Week 1:  PT Short Term Goal 1 (Week 1): = to LTGs based on ELOS  Skilled Therapeutic Interventions/Progress Updates: Pt presents sitting in w/c and agreeable to therapy.  Pt wheeled to dayroom for energy conservation.  Pt transfers sit to stand throughout session w/ min to CGA.  Pt amb throughout session at least 180-200' w/o AD and min A for occasional unsteadiness.  Pt w/ cues for visual scanning, and for even shoulder heights.  Pt amb w/ SB to L but able to correct when cued.  Pt aware of this and that it pulls her to left.  Pt performed multiple balance activities including cone obstacle course, then adding in alternating toe-taps during course, picking up cones w/ alternating UE s and then w/ B hands simultaneously, stepping over walking sticks and then green hurdles w/ LOB  x 2, requiring min to mod A to regain.  Pt requires seated rest breaks between trials 2/2 fatigue but tolerated well.  Pt amb to room and sat on bed.  Pt amb x 5' to w/c w/ min A.  Education given to pt and family for safe approach to seat, before reaching back for handrests.  Chair alarm on and all needs in reach, family present in room.     Therapy Documentation Precautions:  Precautions Precautions: Fall, Other (comment) Precaution Comments: diplopia, dysconjugate gaze Restrictions Weight Bearing Restrictions: No General:   Vital Signs: Therapy Vitals BP: (!) 127/58 Pain:0/10 Pain Assessment Pain Scale: 0-10 Pain Score: 0-No pain Mobility:    Therapy/Group: Individual Therapy  Lucio Edward 05/15/2022, 11:21 AM

## 2022-05-15 NOTE — Progress Notes (Signed)
PROGRESS NOTE   Subjective/Complaints:  Had a good night, daughter at bedside , had questions related to EP eval , BPs, CHF  Reviewed ECHO nl EJ fx, has diastolic dysfx, no leg swelling , one recorded weight   ROS- neg CP, SOB , N/V/D, + constip   Objective:   No results found. Recent Labs    05/13/22 0606  WBC 6.1  HGB 11.8*  HCT 35.3*  PLT 186    Recent Labs    05/13/22 0606  NA 136  K 4.1  CL 107  CO2 23  GLUCOSE 102*  BUN 30*  CREATININE 1.34*  CALCIUM 8.7*     Intake/Output Summary (Last 24 hours) at 05/15/2022 0710 Last data filed at 05/14/2022 1300 Gross per 24 hour  Intake 440 ml  Output --  Net 440 ml      Pressure Injury 05/12/22 Coccyx Left Deep Tissue Pressure Injury - Purple or maroon localized area of discolored intact skin or blood-filled blister due to damage of underlying soft tissue from pressure and/or shear. (Active)  05/12/22 1600  Location: Coccyx  Location Orientation: Left  Staging: Deep Tissue Pressure Injury - Purple or maroon localized area of discolored intact skin or blood-filled blister due to damage of underlying soft tissue from pressure and/or shear.  Wound Description (Comments):   Present on Admission: Yes     Pressure Injury 05/12/22 Coccyx Right Deep Tissue Pressure Injury - Purple or maroon localized area of discolored intact skin or blood-filled blister due to damage of underlying soft tissue from pressure and/or shear. (Active)  05/12/22 1835  Location: Coccyx  Location Orientation: Right  Staging: Deep Tissue Pressure Injury - Purple or maroon localized area of discolored intact skin or blood-filled blister due to damage of underlying soft tissue from pressure and/or shear.  Wound Description (Comments):   Present on Admission: Yes    Physical Exam: Vital Signs Blood pressure (!) 153/48, pulse (!) 43, temperature 98.7 F (37.1 C), temperature source Oral,  resp. rate 15, height 5\' 3"  (1.6 m), weight 85.5 kg, SpO2 97 %.  General: No acute distress Mood and affect are appropriate Heart: Regular rate and rhythm no rubs murmurs or extra sounds Lungs: Clear to auscultation, breathing unlabored, no rales or wheezes Abdomen: Positive bowel sounds, soft nontender to palpation, nondistended Extremities: No clubbing, cyanosis, or edema Skin: No evidence of breakdown, no evidence of rash Neurologic: No ptosis , upward and downward gaze ok, with forward gaze pt c/o vertical diplopia, Left pupil elevated vs right side , left pupil does not adduct past midline  motor strength is 5/5 in bilateral deltoid, bicep, tricep, grip, hip flexor, knee extensors, ankle dorsiflexor and plantar flexor Cerebellar, no dysmetria FNF  Musculoskeletal: Full range of motion in all 4 extremities. No joint swelling    Assessment/Plan: 1. Functional deficits which require 3+ hours per day of interdisciplinary therapy in a comprehensive inpatient rehab setting. Physiatrist is providing close team supervision and 24 hour management of active medical problems listed below. Physiatrist and rehab team continue to assess barriers to discharge/monitor patient progress toward functional and medical goals  Care Tool:  Bathing    Body parts bathed  by patient: Right arm, Left arm, Chest, Abdomen, Front perineal area, Right lower leg, Left upper leg, Right upper leg, Buttocks, Left lower leg, Face         Bathing assist Assist Level: Contact Guard/Touching assist     Upper Body Dressing/Undressing Upper body dressing   What is the patient wearing?: Pull over shirt    Upper body assist Assist Level: Supervision/Verbal cueing    Lower Body Dressing/Undressing Lower body dressing      What is the patient wearing?: Underwear/pull up, Pants     Lower body assist Assist for lower body dressing: Minimal Assistance - Patient > 75%     Toileting Toileting    Toileting  assist Assist for toileting: Contact Guard/Touching assist     Transfers Chair/bed transfer  Transfers assist     Chair/bed transfer assist level: Minimal Assistance - Patient > 75%     Locomotion Ambulation   Ambulation assist      Assist level: Moderate Assistance - Patient 50 - 74% Assistive device: Hand held assist Max distance: 152ft   Walk 10 feet activity   Assist     Assist level: Minimal Assistance - Patient > 75% Assistive device: Hand held assist   Walk 50 feet activity   Assist    Assist level: Moderate Assistance - Patient - 50 - 74% Assistive device: Hand held assist    Walk 150 feet activity   Assist    Assist level: Moderate Assistance - Patient - 50 - 74% Assistive device: Hand held assist    Walk 10 feet on uneven surface  activity   Assist     Assist level: Minimal Assistance - Patient > 75% Assistive device: Hand held assist, Other (comment) (railing)   Wheelchair     Assist Is the patient using a wheelchair?: Yes (only for time management) Type of Wheelchair: Manual    Wheelchair assist level: Dependent - Patient 0%      Wheelchair 50 feet with 2 turns activity    Assist        Assist Level: Dependent - Patient 0%   Wheelchair 150 feet activity     Assist      Assist Level: Dependent - Patient 0%   Blood pressure (!) 153/48, pulse (!) 43, temperature 98.7 F (37.1 C), temperature source Oral, resp. rate 15, height 5\' 3"  (1.6 m), weight 85.5 kg, SpO2 97 %.  Medical Problem List and Plan: 1. 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  Antithrombotics: -DVT/anticoagulation:  Pharmaceutical: Xarelto changed to Eliquis              -antiplatelet therapy: N/A 3. Pain Management: Oxycodone as needed 4. Mood/Behavior/Sleep: Trazodone 50 mg nightly as needed             -antipsychotic agents: N/A 5. Neuropsych/cognition: This patient is capable of making decisions on her own behalf. 6. Skin/Wound Care: Routine skin checks 7. Fluids/Electrolytes/Nutrition: Routine in and outs with follow-up chemistries 8.  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  PAF/pacemaker.  Continue Eliquis  as well as amiodarone 100 mg daily.  Cardiac rate controlled.  have contacted Dr. Graciela Husbands for inpt EP eval       Vitals:   05/15/22 0308 05/15/22 0633  BP: (!) 172/60 (!) 153/48  Pulse: (!) 43 (!) 43  Resp: 16 15  Temp: 98.3 F (36.8 C) 98.7 F (37.1 C)  SpO2: 97% 97%  Discussed permissive HTN is day 5 post CVA will start adjusting meds to reduce BP to normotensive by discharge   10.  Hypothyroidism.  Synthroid 11.  CKD.  Creatinine baseline 1.20-1.30.  Follow-up chemistries    Latest Ref Rng & Units 05/13/2022    6:06 AM 05/12/2022    6:21 AM 05/10/2022    6:12 AM  BMP  Glucose 70 - 99 mg/dL 601  093  235   BUN 8 - 23 mg/dL 30  29  31    Creatinine 0.44 - 1.00 mg/dL  5.73  2.20   Sodium 135 - 145 mmol/L 136  138  139   Potassium 3.5 - 5.1 mmol/L 4.1  4.1  4.2   Chloride 98 - 111 mmol/L 107  106  106   CO2 22 - 32 mmol/L 23  24    Calcium 8.9 - 10.3 mg/dL 8.7  9.0     12.  Diastolic congestive heart failure.  Monitor for any signs of fluid overload      Filed Weights   05/13/22 0449  Weight: 85.5 kg    13.  Hyperlipidemia.  Lipitor    LOS: 3 days A FACE TO FACE EVALUATION WAS PERFORMED  14/06/23 05/15/2022, 7:10 AM

## 2022-05-15 NOTE — Consult Note (Signed)
Cardiology Consultation   Patient ID: Rebekah Pope MRN: [id number]; DOB: [birth year]  Admit date: 05/12/2022 Date of Consult: 05/15/2022  PCP:  Erskine Emery, NP   [facility] Providers Cardiologist:  Garwin Brothers, MD   { Structural: Dr. Clifton James  Patient Profile:   Rebekah Pope is a 81 y.o. female with a hx of HTN, HLD, hypothyroidism, AFib, LBBB, chronic CHF (diastolic), VHD (s/p TAVR 02/10/22 who is being seen 05/15/2022 for the evaluation of bradycardia at the request of Dr. Wynn Banker.   Amiodarone goes back to 2017  History of Present Illness:   [name] underwent TAVR 02/10/22, at his f/u visit he mentioned some dizzy spells, One episode was while sitting outside in the heat. The other was in the setting of bending over. Both were associated with nausea. ZIO monitor was placed. This showed 11 episodes of SVT with longest run at 4 beats. Minimum HR at 33bpm with an average at 45bpm. There was no VT, high grade blocks, or pauses   At subsequent visit dizziness was better but c/o fatigue, worse then pre-TAVR. Discussed bradycardia (40's) though not new and pre-dated TAVR. Chronically on amiodarone. Discussed longstanding LBBB, known SB in the 40's at her baseline.  AT her visit with Dr. Dulce Sellar 04/28/22 he felt fatigue was 2/2 her bradycardia with SSSx and referred to EP (scheduled to see Dr. Graciela Husbands 05/20/22)  She was admitted to [facility] 05/10/22 by EMS with sudden onset nausea, dizziness, weakness, and emesis.  found with scattered tiny acute to subacute infarcts suggesting central embolic disease, brain atrophy and chronic small vessel ischemia with small chronic cerebellar infarcts bilaterally.  Cardiology consulted for elevated HS Trop and bradycardia. Known CAD, no anginal symptoms, no further from cardiology was recommended. Discharged 05/12/22, transitioned for Xarelto to Eliquis in d/w neurology and Dr. Dulce Sellar, and planned to keep her appt with EP  out patient as planned.  She has been in CIR since her discharge. Appears to be progressing with rehab Pt and family inquired about HRs and pending EP evaluation, and we are asked to see her while here.  VS flowsheet reviewed, HRs 40's mostly some higher towards 60's BPs stable   05/13/22 K+ 4.1 BUN/Creat 30/1.34 WBC 6/1 H/H 11/35 Plts 186  She is doing OK, at rest feels well, but just not much exertional capacity, new since her TAVR No syncope or near syncope. 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 Excell Seltzer & Dr. Laneta Simmers   Severe aortic stenosis     Past Surgical History:  Procedure Laterality Date   CESAREAN SECTION     INTRAOPERATIVE TRANSTHORACIC ECHOCARDIOGRAM N/A 02/10/2022   Procedure: INTRAOPERATIVE TRANSTHORACIC ECHOCARDIOGRAM;  Surgeon: Tonny Bollman, MD;  Location: [facility] INVASIVE CV LAB;  Service: Open Heart Surgery;  Laterality: N/A;   RIGHT HEART CATH AND CORONARY ANGIOGRAPHY N/A  10/31/2021   Procedure: RIGHT HEART CATH AND CORONARY ANGIOGRAPHY;  Surgeon: Kathleene Hazel, MD;  Location: [facility] INVASIVE CV LAB;  Service: Cardiovascular;  Laterality: N/A;   TRANSCATHETER AORTIC VALVE REPLACEMENT, TRANSFEMORAL N/A 02/10/2022   Procedure: Transcatheter Aortic Valve Replacement, Transfemoral;  Surgeon: Tonny Bollman, MD;  Location: [facility] INVASIVE CV LAB;  Service: Open Heart Surgery;  Laterality: N/A;      Home Medications:  Prior to Admission medications   Medication Sig Start Date End Date Taking? Authorizing Provider  amiodarone (PACERONE) 200 MG tablet Take 0.5 tablets (100 mg total) by mouth daily. 05/04/22   Baldo Daub, MD  amLODipine (NORVASC) 10 MG tablet Take 1 tablet (10 mg total) by mouth at bedtime. 05/04/22   Baldo Daub, MD  apixaban (ELIQUIS) 5 MG TABS tablet Take 1 tablet (5 mg total) by mouth 2 (two) times daily. 05/12/22   Amin, Loura Halt, MD  atorvastatin (LIPITOR) 80 MG tablet Take 1 tablet (80 mg total) by mouth daily. 05/13/22   Amin, Ankit Chirag, MD  ENTRESTO 24-26 MG Take 1 tablet by mouth 2 (two) times daily. 05/04/22   Baldo Daub, MD  hydrALAZINE (APRESOLINE) 25 MG tablet Take 1 tablet (25 mg total) by mouth 3 (three) times daily. 05/04/22   Baldo Daub, MD  levothyroxine (SYNTHROID) 112 MCG tablet Take 112 mcg by mouth daily before breakfast. 09/29/21   [provider]    Inpatient Medications: Scheduled Meds:  amiodarone  100 mg Oral Daily   amLODipine  10 mg Oral Daily   apixaban  5 mg Oral BID   atorvastatin  80 mg Oral Daily   hydrALAZINE  25 mg Oral Q8H   levothyroxine  112 mcg Oral QAC breakfast   sacubitril-valsartan  1 tablet Oral BID   Continuous Infusions:  PRN Meds: acetaminophen, guaiFENesin, ipratropium-albuterol, oxyCODONE, senna-docusate, traZODone  Allergies:   No Known Allergies  Social History:   Social History   Socioeconomic History   Marital status: Married    Spouse name: Richard   Number of children: 4   Years of education: Not on file   Highest education level: Not on file  Occupational History   Occupation: Animator  Tobacco Use   Smoking status: Never    Passive exposure: Past   Smokeless tobacco: Never  Vaping Use   Vaping Use: Never used  Substance and Sexual Activity   Alcohol use: Never   Drug use: Never   Sexual activity: Not on file  Other Topics Concern   Not  on file  Social History Narrative   Not on file   Social Determinants of Health   Financial Resource Strain: Not on file  Food Insecurity: No Food Insecurity (05/11/2022)   Hunger Vital Sign    Worried About Running Out of Food in the Last Year: Never true    Ran Out of Food in the Last Year: Never true  Transportation Needs: No Transportation Needs (05/11/2022)   PRAPARE - Administrator, Civil Service (Medical): No    Lack of Transportation (Non-Medical): No  Physical Activity: Not on file  Stress: Not on file  Social Connections: Not on file  Intimate Partner Violence: Not At Risk (05/11/2022)   Humiliation, Afraid, Rape, and Kick questionnaire    Fear of Current or Ex-Partner: No    Emotionally Abused: No    Physically Abused: No    Sexually Abused: No    Family History:   Family History  Problem Relation Age of Onset   Dementia Mother    Pneumonia Mother    Hip fracture Mother    Heart disease Father      ROS:  Please see the history of present illness.  All other ROS reviewed and negative.     Physical Exam/Data:   Vitals:   05/14/22 2013 05/15/22 0308 05/15/22 0633 05/15/22 0840  BP: (!) 184/49 (!) 172/60 (!) 153/48 (!) 127/58  Pulse: (!) 40 (!) 43 (!) 43   Resp: 16 16 15    Temp: 97.7 F (36.5 C) 98.3 F (36.8 C) 98.7 F (37.1 C)   TempSrc: Oral Oral Oral   SpO2: 98% 97% 97%   Weight:      Height:        Intake/Output Summary (Last 24 hours) at 05/15/2022 1232 Last data filed at 05/14/2022 1300 Gross per 24 hour  Intake 240 ml  Output --  Net 240 ml      05/13/2022    4:49 AM 05/11/2022    4:29 PM 04/28/2022    2:41 PM  Last 3 Weights  Weight (lbs) 188 lb 7.9 oz 186 lb 4.6 oz 187 lb  Weight (kg) 85.5 kg 84.5 kg 84.823 kg     Body mass index is 33.39 kg/m.  General:  Well nourished, well developed, in no acute distress HEENT: normal, R eye closed Neck: no JVD Vascular: No carotid bruits Cardiac:  RRR; no murmurs, gallops or  rubs Lungs:  CTA b/l, no wheezing, rhonchi or rales  Abd: soft, nontender, no hepatomegaly  Ext: no edema Musculoskeletal:  No deformities Skin: warm and dry  Neuro:  not formally evaluated Psych:  Normal affect   EKG:  The EKG was personally reviewed and demonstrates:    05/10/22 SB 56bpm, LBBB, LAD, PVC  02/10/22: SB 49bpm, LBBB, LAD 02/06/22: SB 50bpm, LBBB, LAD 5//23 SB/sinus arrhythmia 53bpm, LBBB, LAD   Telemetry:  Telemetry was personally reviewed and demonstrates:   N/a  Relevant CV Studies:  03/11/22: TTE 1. Left ventricular ejection fraction, by estimation, is 60 to 65%. The  left ventricle has normal function. The left ventricle has no regional  wall motion abnormalities. Left ventricular diastolic function could not  be evaluated.   2. Right ventricular systolic function is normal. The right ventricular  size is normal. There is normal pulmonary artery systolic pressure. The  estimated right ventricular systolic pressure is 28.4 mmHg.   3. Left atrial size was mild to moderately dilated.   4. Right atrial size was mildly dilated.   5. The mitral valve is grossly normal. Mild mitral valve regurgitation.  Mild mitral stenosis. The mean mitral valve gradient is 4.0 mmHg with  average heart rate of 46 bpm. Severe mitral annular calcification.   6. The aortic valve has been repaired/replaced. Aortic valve  regurgitation is not visualized. There is a 26 mm Medtronic  CoreValve-Evolut Pro prosthetic (TAVR) valve present in the aortic  position. Procedure Date: 02/10/2022. Echo findings are consistent  with normal structure and function of the aortic valve prosthesis. Aortic  valve area, by VTI measures 2.27 cm. Aortic valve mean gradient measures  7.0 mmHg. Aortic valve Vmax measures 1.88 m/s.   7. The inferior vena cava is dilated in size with >50% respiratory  variability, suggesting right atrial pressure of 8 mmHg.   Comparison(s): No significant change from prior  study.   Conclusion(s)/Recommendation(s): Stable TAVR. Sinus bradycardia in the 40s  throughout study.    02/10/22: LHC  Prox RCA lesion is 20% stenosed.   Dist RCA lesion is 20% stenosed.   RPDA lesion is 30% stenosed.   RPAV lesion is 30% stenosed.   Prox Cx lesion is 30% stenosed.   Ramus lesion is 99% stenosed.   Mid LAD lesion is 60% stenosed.   2nd Diag lesion is 70% stenosed.   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   The LAD has moderate mid vessel stenosis. The small diagonal branch has a moderately severe stenosis The Circumflex has mild non-obstructive disease. The small caliber intermediate branch has severe stenosis but is too small for PCI The RCA is a large dominant artery with mild non-obstructive disease in the proximal and distal vessel. The PDA and posterolateral branches have mild non-obstructive disease.  Severe aortic stenosis by echo. I did not cross the aortic valve today.  Elevated right heart pressures   Laboratory Data:  High Sensitivity Troponin:   Recent Labs  Lab 05/10/22 0043 05/10/22 0236  TROPONINIHS 105* 114*     Chemistry Recent Labs  Lab 05/10/22 0043 05/10/22 0612 05/12/22 0621 05/13/22 0606  NA 139 139 138 136  K 4.1 4.2 4.1 4.1  CL 107 106 106 107  CO2 23  --  24 23  GLUCOSE 125* 120* 104* 102*  BUN 32* 31* 29* 30*  CREATININE 1.17* 1.00 1.27* 1.34*  CALCIUM 9.1  --  9.0 8.7*  MG 2.2  --  2.0  --   GFRNONAA 46*  --  42* 39*  ANIONGAP 9  --  8 6    Recent Labs  Lab 05/10/22 0043 05/13/22 0606  PROT 7.0 5.6*  ALBUMIN 3.9 3.1*  AST 22 25  ALT 15 13  ALKPHOS 60 53  BILITOT 0.5 0.7   Lipids  Recent Labs  Lab 05/10/22 0511  CHOL 268*  TRIG 27  HDL 87  LDLCALC 176*  CHOLHDL 3.1    Hematology Recent Labs  Lab 05/10/22 0043 05/10/22 0612 05/13/22 0606  WBC 6.3  --  6.1  RBC 4.46  --  3.99  HGB 13.3 12.6 11.8*  HCT 40.4 37.0 35.3*  MCV 90.6  --  88.5  MCH 29.8  --  29.6  MCHC  32.9  --  33.4  RDW 13.9  --  13.5  PLT 189  --  186   Thyroid  Recent Labs  Lab 05/10/22 0511  TSH 0.529    BNP Recent Labs  Lab 05/10/22 0043  BNP 271.6*    DDimer No results for input(s): "DDIMER" in the last 168 hours.   Radiology/Studies:  No results found.   Assessment and Plan:   Sinus bradycardia LBBB, LAD Normal PR intervals Planned to see Dr. Graciela HusbandsKlein 05/20/22 for evaluation of suspect SSSx, chronotropic incompetence  She denies any feelings of near syncope or syncope. Since her TAVR she will occasionally have a sense of being a little foggy perhaps. She had a marked decline in exertional capacity, "poops out" faster then she did pre-TAVR No CP No palpitations  I do not feel she needs a PPM urgently, though may benefit from one to improve her exertional capacity PPM will significantly reduce her capacit y to rehab, with strict LUE restrictions at least in the acute healing phase.  I suspect our recommendation will be to follow up out patient with EP and plan PPM electively  MD will see her later this afternoon    Risk Assessment/Risk Scores:    For questions or updates, please  contact [facility] Please consult [email or web address] for contact info under    Signed, Sheilah Pigeon, PA-C  05/15/2022 12:32 PM

## 2022-05-15 NOTE — Progress Notes (Signed)
Physical Therapy Session Note  Patient Details  Name: Rebekah Pope MRN: [id number] Date of Birth: Jun 21, 1940  Today's Date: 05/15/2022 PT Individual Time: 1306-1330 PT Individual Time Calculation (min): 24 min   Short Term Goals: Week 1:  PT Short Term Goal 1 (Week 1): = to LTGs based on ELOS Week 2:    Week 3:     Skilled Therapeutic Interventions/Progress Updates:   Pt received sitting in WC and agreeable to PT  Sit<>stand transfer training.   Gait training with no AD x 169f with CGA-min assist with 1 near LOB to the R.   Dynamic gait training   [facility] to [facility] and [facility] reach and all needs met.        Therapy Documentation Precautions:  Precautions Precautions: Fall, Other (comment) Precaution Comments: diplopia, dysconjugate gaze Restrictions Weight Bearing Restrictions: No General:   Vital Signs: Therapy Vitals Temp: 97.6 F (36.4 C) Temp Source: Oral Pulse Rate: (!) 44 Resp: 16 BP: 134/67 Patient Position (if appropriate): Sitting Oxygen Therapy SpO2: 98 % O2 Device: Room Air Pain:   Mobility:   Locomotion :    Trunk/Postural Assessment :    Balance:   Exercises:   Other Treatments:      Therapy/Group: Individual Therapy  ALorie Phenix12/01/2022, 5:48 PM

## 2022-05-15 NOTE — Progress Notes (Addendum)
Occupational Therapy Session Note  Patient Details  Name: Rebekah Pope MRN: [id number] Date of Birth: 1941-05-29  Today's Date: 05/15/2022 OT Individual Time: 0900-1000 OT Individual Time Calculation (min): 60 min OT Individual Time: 8295-6213 OT Individual Time Calculation (min): 60 min    Short Term Goals: Week 1:  OT Short Term Goal 1 (Week 1): Pt will visually scan within R and L visual fields to identify items needed for grooming tasks with min verbal cues OT Short Term Goal 2 (Week 1): Pt will utilize compensatory strategies for diplopia during functional tasks with min A OT Short Term Goal 3 (Week 1): Pt will complete LB dressing CGA  Skilled Therapeutic Interventions/Progress Updates:     Pt received resting in wc greeted and receptive to skilled OT session. Pt reporting 0/10 pain and stating she thinks her double vision is lessening. Pt tolerated wearing glasses with taped lense well.   Pt requested to take shower during session. Pt sit>stand using RW and ambulation to bathroom CGA. Pt transferred to toielt using RW and doffed clothing seated on toilet CGA. Pt ambulated to shower and completed U/LB bathing with close supervision. Toiletry items presented to Pt L visual field with Pt able to accurately reach for items 100%. When Pt instructed to grab shampoo in R visual field, Pt noted to undershoot in depth and overshoot to the R. Following shower, Pt ambulated to room using RW CGA and completed U/LB dressing seated EOB with close supervision. Pt completed grooming/hygiene tasks standing at sink with RW CGA. Pt practiced visually scanning with eyes while keeping head still and Pt noted to undershoot reaching for items on R side. Pt educated on adaptive strategy completing head turn and demonstrating good teach back implementing strategy with min verbal cues.    Pt ambulated down hallway to ADL apartment ~200 ft using RW close supervision. Pt instructed to visually scan from L to  R during ambulation with moderate double vision occurring during activity. Pt reporting it look as if objects are "superimposed on top of each other" when turning head to locate objects/read room numbers. Pt also reporting double vision is less when she keeps her head still and focuses on single item with maximal effort.   Pt completed visual scanning therapeutic activity seated at table to address visual scanning deficits. Pt wearing glasses with inner portion of R lense tapped during task. Pt instructed to visually scan from L to R to locate specific piece of silverware requested by OT. Pt consistently undershooting and reaching laterally to R when attempting to pick up or reach for items. Pt reporting she is seeing two objects side by side today vs. Vertical diplopia, but they are converging together [email or web address] separate objects. Pt educated on double vision and recovery process. Pt transported back to room total A in wc and left resting in wc with call bell in reach, chair alarm on, and all needs met with family present in  room.  PM Session: Pt received resting in wc with family present in room in good spirits and receptive to skilled OT session. Pt dressed and ready for the day with all BADL needs met and wearing prescription gasses with lense taped. Pt reporting 0/10 pain. Pt discussed double vision symptoms with OT stating her double vision is not bad in the morning and increasingly worsens as the day goes on. Pt educated on eye fatigue and that after a full day of using her eyes, the double vision may worsen. Pt educated on  eye breaks and demonstrating understanding.   Pt ambulated down hallway ~300 ft to therapy gym using RW with CGA and min verbal cues for body mechanics and head positioning. Pt stated "I am perceiving that objects on my R side are moving when I am walking, but I know they are not". Pt reporting fatigue following ambulation and was provided a rest break.   Pt completed visual  scanning-complex array/sequence activity while standing at BITS using RW CGA. Pt noted to undershoot when reaching to R visual field. Pt able to stand for 6 minutes during activity without reporting fatigue.  Pt completed Bell cancellation task standing at BITS to address visual scanning and depth perception deficits and practce functional reach required for BADLs. Pt instructed to visually track from L to R to facilitate lateralization of the R eye. Pt reported double vision ~25% of the time and noted to undershoot when reaching to R visual field ~25% of the time.   Pt transported back to room in wc total A and left resting in wc with call bell in reach, chair alarm on, family present, and all needs met.    Therapy Documentation Precautions:  Precautions Precautions: Fall, Other (comment) Precaution Comments: diplopia, dysconjugate gaze Restrictions Weight Bearing Restrictions: No General:   Vital Signs: Therapy Vitals Temp: 98.7 F (37.1 C) Temp Source: Oral Pulse Rate: (Abnormal) 43 Resp: 15 BP: (Abnormal) 153/48 Patient Position (if appropriate): Lying Oxygen Therapy SpO2: 97 % O2 Device: Room Air Pain:   ADL: ADL Eating: Supervision/safety Where Assessed-Eating: Edge of bed Grooming: Setup Where Assessed-Grooming: Edge of bed Upper Body Bathing: Supervision/safety Where Assessed-Upper Body Bathing: Shower Lower Body Bathing: Contact guard Where Assessed-Lower Body Bathing: Shower Upper Body Dressing: Supervision/safety Where Assessed-Upper Body Dressing: Edge of bed Lower Body Dressing: Minimal assistance Where Assessed-Lower Body Dressing: Edge of bed Toileting: Contact guard Where Assessed-Toileting: Glass blower/designer: Therapist, music Method: Counselling psychologist: Energy manager: Curator Method: Heritage manager: Civil engineer, contracting with back   Therapy/Group:  Individual Therapy  Janey Genta 05/15/2022, 8:01 AM

## 2022-05-16 NOTE — Progress Notes (Signed)
Physical Therapy Session Note  Patient Details  Name: Rebekah Pope MRN: [id number] Date of Birth: 26-Jan-1941  Today's Date: 05/16/2022 PT Individual Time: 0807-0900 and 4008-6761 PT Individual Time Calculation (min): 53 min and 27 min   Short Term Goals: Week 1:  PT Short Term Goal 1 (Week 1): = to LTGs based on ELOS  Skilled Therapeutic Interventions/Progress Updates:    Session 1: Pt received standing with walker with nurse present and pt agreeable to therapy session therefore therapist assumed care of patient. Gait training in/out bathroom using RW with close supervision for safety. Sit<>stand toilet<>RW using grab bar support with supervision. Pt able to manage clothing without assist and continent of bladder. Standing hand hygiene at sink demoing correct and safe use of RW without cuing with supervision.   Gait training ~164ft to main therapy gym using RW, including opening room door, with supervision - pt demos slow, but safe gait speed and excellent AD management - reciprocal stepping pattern although slightly guarded upper body posturing.  Pt still wearing glasses with R eye medial side taped.   Removed glasses and reassessed oculomotor function and eye alignment  - pt with improved eye alignment at rest looking at midline compared to at eval - used letter "F" for visual assessment - pt reports she can see 1 clear F closer to her (reading distance) but the further away, in midline, the horizontal middle bar of the F starts to double vertically and worsens with furthering the distance from her eyes  - pt continues to lack full L eye medial rotation (superior and inferior vertical tracking are WNL) - this significantly impact's pt's ability to visually scan and coordinate eye movements to look towards R - performed horizontal visual tracking with only moving ~5degrees R of midline and ~15degrees L of midline  - progressed to R/L horizontal saccades (slowed) with same placement of  "I" and "F" targets with goal of finding target and aligning eyes with little to no diplopia - noticed R eye significantly deviates laterally towards R when initially looking towards the R and pt only seeing the target with her L eye  Pt puts glasses back on.  Gait training ~152ft, no AD, with CGA for safety/steadying due to continued slight R/L lateral sway/minor LOB - pt noted to tense up R shoulder with slight L lateral trunk flexion associated with it, light tactile and verbal cuing to correct - reciprocal stepping pattern - noticed to slow and stop when turning.   Gait training back towards her room using RW initially with close supervision progressed to no AD with CGA for steadying. At end of session, pt left seated in w/c with needs in reach and seat belt alarm on.   Session 2: Pt received sitting in w/c with her family present and pt agreeable to therapy session. Therapist educated pt/family on recommendation for follow-up OPPT and that SW will help coordinate setting that up. Sit<>stands, no AD, with CGA for steadying/safety during session.   Gait training ~168ft to main therapy gym, no AD, with CGA for steadying - pt tending to veer to the R with pt stating she recognizes it but unable to correct - reciprocal stepping pattern with decreased gait speed.  Dynamic gait training using agility ladder, no UE support, performing:  - forward reciprocal stepping pattern x4 laps with pt continuing to have R veer and required min assist for balance, steps just a couple of inches too short to step into each square - side stepping  through agility ladder with pt having greater difficulty stepping large enough laterally towards L compared to R - light min assist for balance throughout with pt utilizing ankle strategy to recover minor LOB as needed  - backwards step-to pattern with pt noted to have more difficulty leading with L LE in this direction as well due to not weight shifting back onto that foot to  allow her to lift R LE  Gait training ~167ft back to her room, no AD, with CGA/light min assist for steadying and 1x minor R LOB when pt suddenly turning head to look in that direction - cuing to look up with forward gaze rather than downward and increase gait speed.  At end of session, pt left seated in w/c with needs in reach, seat belt alarm on, and family present.   Therapy Documentation Precautions:  Precautions Precautions: Fall, Other (comment) Precaution Comments: diplopia, dysconjugate gaze Restrictions Weight Bearing Restrictions: No   Pain:  Session 1: Denies pain during session.  Session 2: Reports some knee discomfort but states "no more than normal"  Therapy/Group: Individual Therapy  Tawana Scale , PT, DPT, NCS, CSRS 05/16/2022, 7:43 AM

## 2022-05-16 NOTE — Progress Notes (Signed)
PROGRESS NOTE   Subjective/Complaints:  No issues overnite   ROS- neg CP, SOB , N/V/D, + constip   Objective:   No results found. No results for input(s): "WBC", "HGB", "HCT", "PLT" in the last 72 hours.  No results for input(s): "NA", "K", "CL", "CO2", "GLUCOSE", "BUN", "CREATININE", "CALCIUM" in the last 72 hours.   Intake/Output Summary (Last 24 hours) at 05/16/2022 1239 Last data filed at 05/16/2022 4599 Gross per 24 hour  Intake 360 ml  Output --  Net 360 ml      Pressure Injury 05/12/22 Coccyx Left Deep Tissue Pressure Injury - Purple or maroon localized area of discolored intact skin or blood-filled blister due to damage of underlying soft tissue from pressure and/or shear. (Active)  05/12/22 1600  Location: Coccyx  Location Orientation: Left  Staging: Deep Tissue Pressure Injury - Purple or maroon localized area of discolored intact skin or blood-filled blister due to damage of underlying soft tissue from pressure and/or shear.  Wound Description (Comments):   Present on Admission: Yes     Pressure Injury 05/12/22 Coccyx Right Deep Tissue Pressure Injury - Purple or maroon localized area of discolored intact skin or blood-filled blister due to damage of underlying soft tissue from pressure and/or shear. (Active)  05/12/22 1835  Location: Coccyx  Location Orientation: Right  Staging: Deep Tissue Pressure Injury - Purple or maroon localized area of discolored intact skin or blood-filled blister due to damage of underlying soft tissue from pressure and/or shear.  Wound Description (Comments):   Present on Admission: Yes    Physical Exam: Vital Signs Blood pressure (!) 158/119, pulse (!) 44, temperature 97.7 F (36.5 C), resp. rate 16, height 5\' 3"  (1.6 m), weight 85.5 kg, SpO2 98 %.  General: No acute distress Mood and affect are appropriate Heart: Regular rate and rhythm no rubs murmurs or extra  sounds Lungs: Clear to auscultation, breathing unlabored, no rales or wheezes Abdomen: Positive bowel sounds, soft nontender to palpation, nondistended Extremities: No clubbing, cyanosis, or edema Skin: No evidence of breakdown, no evidence of rash Neurologic: No ptosis , upward and downward gaze ok, with forward gaze pt c/o vertical diplopia, Left pupil elevated vs right side , left pupil does not adduct past midline  motor strength is 5/5 in bilateral deltoid, bicep, tricep, grip, hip flexor, knee extensors, ankle dorsiflexor and plantar flexor Cerebellar, no dysmetria FNF  Musculoskeletal: Full range of motion in all 4 extremities. No joint swelling    Assessment/Plan: 1. Functional deficits which require 3+ hours per day of interdisciplinary therapy in a comprehensive inpatient rehab setting. Physiatrist is providing close team supervision and 24 hour management of active medical problems listed below. Physiatrist and rehab team continue to assess barriers to discharge/monitor patient progress toward functional and medical goals  Care Tool:  Bathing    Body parts bathed by patient: Right arm, Left arm, Chest, Abdomen, Front perineal area, Right lower leg, Left upper leg, Right upper leg, Buttocks, Left lower leg, Face         Bathing assist Assist Level: Contact Guard/Touching assist     Upper Body Dressing/Undressing Upper body dressing   What is the patient wearing?:  Pull over shirt    Upper body assist Assist Level: Supervision/Verbal cueing    Lower Body Dressing/Undressing Lower body dressing      What is the patient wearing?: Underwear/pull up, Pants     Lower body assist Assist for lower body dressing: Minimal Assistance - Patient > 75%     Toileting Toileting    Toileting assist Assist for toileting: Contact Guard/Touching assist     Transfers Chair/bed transfer  Transfers assist     Chair/bed transfer assist level: Minimal Assistance - Patient >  75%     Locomotion Ambulation   Ambulation assist      Assist level: Minimal Assistance - Patient > 75% Assistive device: No Device Max distance: 200   Walk 10 feet activity   Assist     Assist level: Minimal Assistance - Patient > 75% Assistive device: No Device   Walk 50 feet activity   Assist    Assist level: Minimal Assistance - Patient > 75% Assistive device: No Device    Walk 150 feet activity   Assist    Assist level: Minimal Assistance - Patient > 75% Assistive device: No Device    Walk 10 feet on uneven surface  activity   Assist     Assist level: Minimal Assistance - Patient > 75% Assistive device: Hand held assist, Other (comment) (railing)   Wheelchair     Assist Is the patient using a wheelchair?: Yes (only for time management) Type of Wheelchair: Manual    Wheelchair assist level: Dependent - Patient 0%      Wheelchair 50 feet with 2 turns activity    Assist        Assist Level: Dependent - Patient 0%   Wheelchair 150 feet activity     Assist      Assist Level: Dependent - Patient 0%   Blood pressure (!) 158/119, pulse (!) 44, temperature 97.7 F (36.5 C), resp. rate 16, height 5\' 3"  (1.6 m), weight 85.5 kg, SpO2 98 %.  Medical Problem List and Plan: 1. 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  Antithrombotics: -DVT/anticoagulation:  Pharmaceutical: Xarelto changed to Eliquis              -antiplatelet therapy: N/A 3. Pain Management: Oxycodone as needed 4. Mood/Behavior/Sleep: Trazodone 50 mg nightly as needed              -antipsychotic agents: N/A 5. Neuropsych/cognition: This patient is capable of making decisions on her own behalf. 6. Skin/Wound Care: Routine skin checks 7. Fluids/Electrolytes/Nutrition: Routine in and outs with follow-up chemistries 8.  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  PAF/pacemaker.  Continue Eliquis  as well as amiodarone 100 mg daily. Appreciate Dr. 04/12/2022 consult , no need for PPM        Vitals:   05/16/22 0515 05/16/22 0710  BP: (!) 167/54 (!) 158/119  Pulse: (!) 43 (!) 44  Resp: 16   Temp: 97.7 F (36.5 C)   SpO2: 98%  Discussed permissive HTN is day 5 post CVA will start adjusting meds to reduce BP to normotensive by discharge   10.  Hypothyroidism.  Synthroid 11.  CKD.  Creatinine baseline 1.20-1.30.  Follow-up chemistries    Latest Ref Rng & Units 05/13/2022    6:06 AM 05/12/2022    6:21 AM 05/10/2022    6:12 AM  BMP  Glucose 70 - 99 mg/dL 253  664  403   BUN 8 - 23 mg/dL 30  29  31    Creatinine 0.44 - 1.00 mg/dL  4.74  2.59   Sodium 135 - 145 mmol/L 136  138  139   Potassium 3.5 - 5.1 mmol/L 4.1  4.1  4.2   Chloride 98 - 111 mmol/L 107  106  106   CO2 22 - 32 mmol/L 23  24    Calcium 8.9 - 10.3 mg/dL 8.7  9.0     12.  Diastolic congestive heart failure.  Monitor for any signs of fluid overload      Filed Weights   05/13/22 0449  Weight: 85.5 kg   13.  Hyperlipidemia.  Lipitor    LOS: 4 days A FACE TO FACE EVALUATION WAS PERFORMED  14/06/23 05/16/2022, 12:39 PM

## 2022-05-16 NOTE — Progress Notes (Signed)
Occupational Therapy Session Note  Patient Details  Name: Rebekah Pope MRN: [id number] Date of Birth: 03-19-1941  Today's Date: 05/16/2022 OT Individual Time: 9450-3888 1300-1345 OT Individual Time Calculation (min): 60 min & 45 min   Short Term Goals: Week 1:  OT Short Term Goal 1 (Week 1): Pt will visually scan within R and L visual fields to identify items needed for grooming tasks with min verbal cues OT Short Term Goal 2 (Week 1): Pt will utilize compensatory strategies for diplopia during functional tasks with min A OT Short Term Goal 3 (Week 1): Pt will complete LB dressing CGA  Skilled Therapeutic Interventions/Progress Updates:    Upon OT arrival, pt seated in w/c. Pt reports no pain and is agreeable to OT session. Treatment intervention with a focus on IADLs and visual perception. Pt was transported to ADL apartment via w/c and total A. Pt ambulates within kitchen using RW and SBA to retrieve items from countertop that were places by therapist, scan drawers, countertops, and kitchen to read labels and return items to their designated locations. Pt demonstrates good ability to scan throughout task but does require increased time. Pt had no LOB during task and returns to seated position with SBA. Pt was transported down to dayroom gym via w/c and total A. While seated at tabletop, pt flips over black quirkle game pieces to the colored side without difficulty. Pt then sorts items based on shape with 2 errors and pt able to correct first error herself but requires cue to recognize error on R side. Pt then retrieves items one at a time from L side of table and returns items to R side of table into game bag. Pt completes without difficulty. Pt was returned to her room via w/c and total A and left in w/c at end of session with all needs met and safety measures in place.   Session 2: Upon OT arrival, pt seated in w/c requesting to use the bathroom. Pt agreeable to OT session and reports no  pain. Pt completes stand step transfer with RW and Supervision to bathroom to complete toilet transfer, toileting, and hand hygiene with Supervision. Pt requires min verbal and tactile cues to locate and safely return to her w/c secondary to decreased visual perception. Pt was transported to dayroom gym via w/c and total A. Therapist places bean bags around dayroom on high and low surfaces and pt retrieves bean bags with RW and SBA. Pt requires min verbal cues to locate last bean bag but had no LOB throughout task. Pt returns to her w/c with Supervision. While seated in w/c pt completes 2x10 reps of B UE exercises using 3lb dumbbell. Pt completes shoulder flex/ext, elbow flex/ext, overhead press, chest press, elbow flex/ext, supin/prona, shoulder abd/add, and wrist flex/ext. Pt requires min verbal cues for proper form but tolerates exercises well. Pt's daughter present during second half of session. Pt was transported back to her room via w/c and total A and was left in w/c at end of session with all needs met and safety measures in place.   Therapy Documentation Precautions:  Precautions Precautions: Fall, Other (comment) Precaution Comments: diplopia, dysconjugate gaze Restrictions Weight Bearing Restrictions: No   Therapy/Group: Individual Therapy  Marvetta Gibbons 05/16/2022, 12:21 PM

## 2022-05-16 NOTE — Progress Notes (Signed)
Slept very well last night. Patient did not get hydralazine dose last night as she was fast asleep when I went in to check on her. BP monitored due to patient history. Daughter updated last night. Denies pain. Safety maintained at all times.

## 2022-05-17 NOTE — Progress Notes (Signed)
PROGRESS NOTE   Subjective/Complaints:  Discussed electrophysiology consult with daughter yesterday, performed by Dr Lalla Brothers but follow up with be with Dr Graciela Husbands No need for PPM at this time due to asymptomatic bradycardia   ROS- neg CP, SOB , N/V/D, + constip   Objective:   No results found. No results for input(s): "WBC", "HGB", "HCT", "PLT" in the last 72 hours.  No results for input(s): "NA", "K", "CL", "CO2", "GLUCOSE", "BUN", "CREATININE", "CALCIUM" in the last 72 hours.   Intake/Output Summary (Last 24 hours) at 05/17/2022 0944 Last data filed at 05/17/2022 0847 Gross per 24 hour  Intake 700 ml  Output --  Net 700 ml      Pressure Injury 05/12/22 Coccyx Left Deep Tissue Pressure Injury - Purple or maroon localized area of discolored intact skin or blood-filled blister due to damage of underlying soft tissue from pressure and/or shear. (Active)  05/12/22 1600  Location: Coccyx  Location Orientation: Left  Staging: Deep Tissue Pressure Injury - Purple or maroon localized area of discolored intact skin or blood-filled blister due to damage of underlying soft tissue from pressure and/or shear.  Wound Description (Comments):   Present on Admission: Yes     Pressure Injury 05/12/22 Coccyx Right Deep Tissue Pressure Injury - Purple or maroon localized area of discolored intact skin or blood-filled blister due to damage of underlying soft tissue from pressure and/or shear. (Active)  05/12/22 1835  Location: Coccyx  Location Orientation: Right  Staging: Deep Tissue Pressure Injury - Purple or maroon localized area of discolored intact skin or blood-filled blister due to damage of underlying soft tissue from pressure and/or shear.  Wound Description (Comments):   Present on Admission: Yes    Physical Exam: Vital Signs Blood pressure (!) 142/54, pulse (!) 40, temperature 98.1 F (36.7 C), resp. rate 18, height 5\' 3"   (1.6 m), weight 85.5 kg, SpO2 96 %.  General: No acute distress Mood and affect are appropriate Heart: Regular rate and rhythm no rubs murmurs or extra sounds Lungs: Clear to auscultation, breathing unlabored, no rales or wheezes Abdomen: Positive bowel sounds, soft nontender to palpation, nondistended Extremities: No clubbing, cyanosis, or edema Skin: No evidence of breakdown, no evidence of rash Neurologic: No ptosis , upward and downward gaze ok, with forward gaze pt c/o vertical diplopia, Left pupil elevated vs right side , left pupil does not adduct past midline  motor strength is 5/5 in bilateral deltoid, bicep, tricep, grip, hip flexor, knee extensors, ankle dorsiflexor and plantar flexor Cerebellar, no dysmetria FNF  Musculoskeletal: Full range of motion in all 4 extremities. No joint swelling    Assessment/Plan: 1. Functional deficits which require 3+ hours per day of interdisciplinary therapy in a comprehensive inpatient rehab setting. Physiatrist is providing close team supervision and 24 hour management of active medical problems listed below. Physiatrist and rehab team continue to assess barriers to discharge/monitor patient progress toward functional and medical goals  Care Tool:  Bathing    Body parts bathed by patient: Right arm, Left arm, Chest, Abdomen, Front perineal area, Right lower leg, Left upper leg, Right upper leg, Buttocks, Left lower leg, Face  Bathing assist Assist Level: Contact Guard/Touching assist     Upper Body Dressing/Undressing Upper body dressing   What is the patient wearing?: Pull over shirt    Upper body assist Assist Level: Supervision/Verbal cueing    Lower Body Dressing/Undressing Lower body dressing      What is the patient wearing?: Underwear/pull up, Pants     Lower body assist Assist for lower body dressing: Minimal Assistance - Patient > 75%     Toileting Toileting    Toileting assist Assist for toileting:  Contact Guard/Touching assist     Transfers Chair/bed transfer  Transfers assist     Chair/bed transfer assist level: Contact Guard/Touching assist     Locomotion Ambulation   Ambulation assist      Assist level: Minimal Assistance - Patient > 75% Assistive device: No Device Max distance: 200   Walk 10 feet activity   Assist     Assist level: Minimal Assistance - Patient > 75% Assistive device: No Device   Walk 50 feet activity   Assist    Assist level: Minimal Assistance - Patient > 75% Assistive device: No Device    Walk 150 feet activity   Assist    Assist level: Minimal Assistance - Patient > 75% Assistive device: No Device    Walk 10 feet on uneven surface  activity   Assist     Assist level: Minimal Assistance - Patient > 75% Assistive device: Hand held assist, Other (comment) (railing)   Wheelchair     Assist Is the patient using a wheelchair?: Yes (only for time management) Type of Wheelchair: Manual    Wheelchair assist level: Dependent - Patient 0%      Wheelchair 50 feet with 2 turns activity    Assist        Assist Level: Dependent - Patient 0%   Wheelchair 150 feet activity     Assist      Assist Level: Dependent - Patient 0%   Blood pressure (!) 142/54, pulse (!) 40, temperature 98.1 F (36.7 C), resp. rate 18, height 5\' 3"  (1.6 m), weight 85.5 kg, SpO2 96 %.  Medical Problem List and Plan: 1. 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  Antithrombotics: -DVT/anticoagulation:  Pharmaceutical: Xarelto changed to  Eliquis              -antiplatelet therapy: N/A 3. Pain Management: Oxycodone as needed 4. Mood/Behavior/Sleep: Trazodone 50 mg nightly as needed             -antipsychotic agents: N/A 5. Neuropsych/cognition: This patient is capable of making decisions on her own behalf. 6. Skin/Wound Care: Routine skin checks 7. Fluids/Electrolytes/Nutrition: Routine in and outs with follow-up chemistries 8.  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  PAF/pacemaker.  Continue Eliquis  as well as amiodarone 100 mg daily. Appreciate Dr. 04/12/2022 consult , no need for PPM        Vitals:   05/16/22 2208 05/17/22 0320  BP: (!) 152/60 14/10/23)  142/54  Pulse: (!) 43 (!) 40  Resp:  18  Temp:  98.1 F (36.7 C)  SpO2:  96%  Discussed permissive HTN is day 5 post CVA will start adjusting meds to reduce BP to normotensive by discharge   10.  Hypothyroidism.  Synthroid 11.  CKD.  Creatinine baseline 1.20-1.30.  Follow-up chemistries    Latest Ref Rng & Units 05/13/2022    6:06 AM 05/12/2022    6:21 AM 05/10/2022    6:12 AM  BMP  Glucose 70 - 99 mg/dL 732  202  542   BUN 8 - 23 mg/dL 30  29  31    Creatinine 0.44 - 1.00 mg/dL  7.06  2.37   Sodium 135 - 145 mmol/L 136  138  139   Potassium 3.5 - 5.1 mmol/L 4.1  4.1  4.2   Chloride 98 - 111 mmol/L 107  106  106   CO2 22 - 32 mmol/L 23  24    Calcium 8.9 - 10.3 mg/dL 8.7  9.0     12.  Diastolic congestive heart failure.  Monitor for any signs of fluid overload      Filed Weights   05/13/22 0449  Weight: 85.5 kg   13.  Hyperlipidemia.  Lipitor    LOS: 5 days A FACE TO FACE EVALUATION WAS PERFORMED  14/06/23 05/17/2022, 9:44 AM

## 2022-05-18 DIAGNOSIS — H532 Diplopia: Secondary | ICD-10-CM

## 2022-05-18 NOTE — Progress Notes (Signed)
Occupational Therapy Session Note  Patient Details  Name: Rebekah Pope MRN: [id number] Date of Birth: 03-31-1941  Today's Date: 05/18/2022 OT Individual Time: 1000-1100 OT Individual Time Calculation (min): 60 min    Short Term Goals: Week 1:  OT Short Term Goal 1 (Week 1): Pt will visually scan within R and L visual fields to identify items needed for grooming tasks with min verbal cues OT Short Term Goal 2 (Week 1): Pt will utilize compensatory strategies for diplopia during functional tasks with min A OT Short Term Goal 3 (Week 1): Pt will complete LB dressing CGA  Skilled Therapeutic Interventions/Progress Updates:     Pt received resting in wc in good spirits and receptive to skilled OT session. Pt reporting 0/10 pain. Morning BADL needs met upon OT arrival with Pt dressed, groomed, and ready for the day. Pt reported she is having decreased diplopia when looking at clock or tv without tapped glasses on, Pt diplopia is most often present when changing focus or turning her head. Treatment this session with a focus on visual motor/perceptual skills.   Pt completed visual motor home exercises while seated in wc to facilitate carryover outside of therapy sessions with correct technique. Pt required min verbal cues to recall amount of reps for each exercise and for technique when completing exercises.   Following vision exercises, Pt requested to use restroom. Pt ambulated to restroom using RW and completed 3/3 toileting tasks on standard toilet (continent void) with close supervision standing at sink to wash her hands. Pt was transported to therapy gy total A in wc.   Pt completed visual motor card matching activity while standing at vertical mirror using her RW. Pt initially instructed to remove all playing cards from horizontal mirror and place them face down on elevated table on Pt R side to facilitate targeted reach. Pt reported double vision ~25% of the time during task and noted  to undershoot when reaching for cards <25% of the time. Pt removed all cards with min verbal cues to attend to cards on R side of mirror.   Pt was instructed to pick up cards one at a time from table and match it to correct card on mirror to facilitate changing from close to far gaze. Pt completed task with ~5 errors and min verbal cues to correct errors. Pt was able to finish ~50% of the task while standing with task then being grade down to seated activity d/t decreased activity tolerance. Pt reported diplopia during activity when shifting gaze and decrease in diplopia reported when focusing gaze on single target when given increased time and maximal effort. Pt was transported back to room total A in wc for time management and left resting in wc with call bell in reach, call belt alarm on, and all needs met.    Therapy Documentation Precautions:  Precautions Precautions: Fall, Other (comment) Precaution Comments: diplopia, dysconjugate gaze Restrictions Weight Bearing Restrictions: No  Pain: Pain Assessment Pain Scale: 0-10 Pain Score: 0-No pain  Therapy/Group: Individual Therapy  Janey Genta 05/18/2022, 10:20 AM

## 2022-05-18 NOTE — Progress Notes (Signed)
PROGRESS NOTE   Subjective/Complaints:  Pt up in chair. No new issues this morning. Daughter left for home in [place]. Had bm yesterday  ROS: Patient denies fever, rash, sore throat, blurred vision, dizziness, nausea, vomiting, diarrhea, cough, shortness of breath or chest pain, joint or back/neck pain, headache, or mood change.   Objective:   No results found. No results for input(s): "WBC", "HGB", "HCT", "PLT" in the last 72 hours.  No results for input(s): "NA", "K", "CL", "CO2", "GLUCOSE", "BUN", "CREATININE", "CALCIUM" in the last 72 hours.   Intake/Output Summary (Last 24 hours) at 05/18/2022 1333 Last data filed at 05/18/2022 1244 Gross per 24 hour  Intake 720 ml  Output --  Net 720 ml     Pressure Injury 05/12/22 Coccyx Left Deep Tissue Pressure Injury - Purple or maroon localized area of discolored intact skin or blood-filled blister due to damage of underlying soft tissue from pressure and/or shear. (Active)  05/12/22 1600  Location: Coccyx  Location Orientation: Left  Staging: Deep Tissue Pressure Injury - Purple or maroon localized area of discolored intact skin or blood-filled blister due to damage of underlying soft tissue from pressure and/or shear.  Wound Description (Comments):   Present on Admission: Yes     Pressure Injury 05/12/22 Coccyx Right Deep Tissue Pressure Injury - Purple or maroon localized area of discolored intact skin or blood-filled blister due to damage of underlying soft tissue from pressure and/or shear. (Active)  05/12/22 1835  Location: Coccyx  Location Orientation: Right  Staging: Deep Tissue Pressure Injury - Purple or maroon localized area of discolored intact skin or blood-filled blister due to damage of underlying soft tissue from pressure and/or shear.  Wound Description (Comments):   Present on Admission: Yes    Physical Exam: Vital Signs Blood pressure (!) 124/52,  pulse (!) 46, temperature 97.9 F (36.6 C), temperature source Oral, resp. rate 17, height 5\' 3"  (1.6 m), weight 84.7 kg, SpO2 99 %.  Constitutional: No distress . Vital signs reviewed. HEENT: NCAT, EOMI, oral membranes moist Neck: supple Cardiovascular: RRR without murmur. No JVD    Respiratory/Chest: CTA Bilaterally without wheezes or rales. Normal effort    GI/Abdomen: BS +, non-tender, non-distended Ext: no clubbing, cyanosis, or edema Psych: pleasant and cooperative  Skin: No evidence of breakdown, no evidence of rash Neurologic: No ptosis , upward and downward gaze ok, with forward gaze pt c/o vertical diplopia, Left pupil elevated vs right side , left pupil does not adduct past midline. Glasses taped. motor strength is 5/5 in bilateral deltoid, bicep, tricep, grip, hip flexor, knee extensors, ankle dorsiflexor and plantar flexor Cerebellar, no dysmetria FNF  Musculoskeletal: Full range of motion in all 4 extremities. No joint swelling    Assessment/Plan: 1. Functional deficits which require 3+ hours per day of interdisciplinary therapy in a comprehensive inpatient rehab setting. Physiatrist is providing close team supervision and 24 hour management of active medical problems listed below. Physiatrist and rehab team continue to assess barriers to discharge/monitor patient progress toward functional and medical goals  Care Tool:  Bathing    Body parts bathed by patient: Right arm, Left arm, Chest, Abdomen, Front perineal area, Right lower  leg, Left upper leg, Right upper leg, Buttocks, Left lower leg, Face         Bathing assist Assist Level: Contact Guard/Touching assist     Upper Body Dressing/Undressing Upper body dressing   What is the patient wearing?: Pull over shirt    Upper body assist Assist Level: Supervision/Verbal cueing    Lower Body Dressing/Undressing Lower body dressing      What is the patient wearing?: Underwear/pull up, Pants     Lower body  assist Assist for lower body dressing: Minimal Assistance - Patient > 75%     Toileting Toileting    Toileting assist Assist for toileting: Contact Guard/Touching assist     Transfers Chair/bed transfer  Transfers assist     Chair/bed transfer assist level: Contact Guard/Touching assist     Locomotion Ambulation   Ambulation assist      Assist level: Minimal Assistance - Patient > 75% Assistive device: No Device Max distance: 200   Walk 10 feet activity   Assist     Assist level: Minimal Assistance - Patient > 75% Assistive device: No Device   Walk 50 feet activity   Assist    Assist level: Minimal Assistance - Patient > 75% Assistive device: No Device    Walk 150 feet activity   Assist    Assist level: Minimal Assistance - Patient > 75% Assistive device: No Device    Walk 10 feet on uneven surface  activity   Assist     Assist level: Minimal Assistance - Patient > 75% Assistive device: Hand held assist, Other (comment) (railing)   Wheelchair     Assist Is the patient using a wheelchair?: Yes (only for time management) Type of Wheelchair: Manual    Wheelchair assist level: Dependent - Patient 0%      Wheelchair 50 feet with 2 turns activity    Assist        Assist Level: Dependent - Patient 0%   Wheelchair 150 feet activity     Assist      Assist Level: Dependent - Patient 0%   Blood pressure (!) 124/52, pulse (!) 46, temperature 97.9 F (36.6 C), temperature source Oral, resp. rate 17, height 5\' 3"  (1.6 m), weight 84.7 kg, SpO2 99 %.  Medical Problem List and Plan: 1. 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  Antithrombotics: -DVT/anticoagulation:  Pharmaceutical: Xarelto changed to Eliquis              -antiplatelet therapy: N/A 3. Pain Management: Oxycodone as needed 4. Mood/Behavior/Sleep: Trazodone 50 mg nightly as needed             -antipsychotic agents: N/A 5. Neuropsych/cognition: This patient is capable of making decisions on her own behalf. 6. Skin/Wound Care: Routine skin checks 7. Fluids/Electrolytes/Nutrition: Routine in and outs with follow-up chemistries 8.  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  PAF/pacemaker.  Continue Eliquis  as well as amiodarone 100 mg daily. Appreciate Dr. Quentin Ore consult ,  no need for PPM        Vitals:   05/18/22 0325 05/18/22 1247  BP: (!) 127/54 (!) 124/52  Pulse: (!) 46 (!) 46  Resp: 17 17  Temp: 97.7 F (36.5 C) 97.9 F (36.6 C)  SpO2: 97% 99%  Discussed permissive HTN is day 5 post CVA will start adjusting meds to reduce BP to normotensive by discharge  12/11 bp well controlled 10.  Hypothyroidism.  Synthroid 11.  CKD.  Creatinine baseline 1.20-1.30.  Follow-up chemistries    Latest Ref Rng & Units 05/13/2022    6:06 AM 05/12/2022    6:21 AM 05/10/2022    6:12 AM  BMP  Glucose 70 - 99 mg/dL 102  104  120   BUN 8 - 23 mg/dL 30  29  31    Creatinine 0.44 - 1.00 mg/dL 1.34  1.27  1.00   Sodium 135 - 145 mmol/L 136  138  139   Potassium 3.5 - 5.1 mmol/L 4.1  4.1  4.2   Chloride 98 - 111 mmol/L 107  106  106   CO2 22 - 32 mmol/L 23  24    Calcium 8.9 - 10.3 mg/dL 8.7  9.0     12.  Diastolic congestive heart failure.  Monitor for any signs of fluid overload      Filed Weights   05/13/22 0449 05/18/22 0552  Weight: 85.5 kg 84.7 kg   13.  Hyperlipidemia.  Lipitor    LOS: 6 days A FACE TO FACE EVALUATION WAS PERFORMED  Meredith Staggers 05/18/2022, 1:33 PM

## 2022-05-18 NOTE — Discharge Summary (Signed)
Physician Discharge Summary  Patient ID: Rebekah Pope MRN: [id number] DOB/AGE: April 01, 1941 81 y.o.  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 Predominant underlying rhythm was Sinus Rhythm. Bundle Branch Block/IVCD was present. There were no pauses of 3 seconds or greater and no episodes of second or third-degree AV block. There was 1 triggered event sinus bradycardia 40 bpm 5 Supraventricular Tachycardia runs occurred, the run with the fastest interval lasting 4 beats with a max rate of 129 bpm, the longest lasting 7 beats with an avg rate of 101 bpm. Isolated SVEs were rare (<1.0%), SVE Couplets were rare (<1.0%), and no SVE Triplets were present.  There were no episodes of atrial fibrillation or flutter Isolated VEs were rare (<1.0%, 4598), VE Couplets were rare (<1.0%, 2), and VE Triplets were rare (<1.0%, 5). Ventricular Bigeminy was present.   ECHOCARDIOGRAM COMPLETE BUBBLE STUDY  Result Date: 05/11/2022    ECHOCARDIOGRAM REPORT   Patient Name:   Rebekah Pope Date of Exam: 05/11/2022 Medical Rec #:  [id number]            Height:       63.0 in Accession #:    [id number]           Weight:       187.0 lb Date of Birth:  July 25, 1940             BSA:          1.879 m Patient Age:    81 years             BP:           165/64 mmHg Patient Gender: F                    HR:           48 bpm. Exam Location:  Inpatient Procedure: 2D Echo, Color Doppler  and Cardiac Doppler Indications:    Stroke i63.9  History:        Patient has prior history of Echocardiogram examinations, most                 recent 03/11/2022. Arrythmias:Atrial Fibrillation; Risk                 Factors:Dyslipidemia. 31mm Medtronic TAVR Placed 02/10/22.  Sonographer:    Irving Burton Senior RDCS Referring Phys: [id number] Oliver Pila HALL  Sonographer Comments: No IV access for bubble study. IMPRESSIONS  1. Left ventricular ejection fraction, by estimation, is 65 to 70%. The left ventricle has normal function. The left ventricle has no regional wall motion abnormalities. There is mild left ventricular hypertrophy. Left ventricular diastolic parameters are consistent with Grade II diastolic dysfunction (pseudonormalization). Elevated left atrial pressure.  2. Right ventricular systolic function is normal. The right ventricular size is normal. There is normal pulmonary artery systolic pressure.  3. Left atrial size was moderately dilated.  4. Right atrial size was moderately dilated.  5. MV is thickened with mildy resricted motoin. Mean gradient through the valve is 3.5  cm2. (HR 49 bpm) Unchanged from echo on 02/10/22.. Mild mitral valve regurgitation. Moderate to severe mitral annular calcification.  6. S/p TAVR (26 mm Medtronic CoreValve-Evolut Pro prothesis, procedure date 02/10/22) Mean gradient through the valve is 8 mm Hg. No significant change from echo done 02/10/22.. The aortic valve has been repaired/replaced. Aortic valve regurgitation is not visualized.  7. The inferior vena cava is normal in size with greater than 50% respiratory variability, suggesting right atrial pressure of 3 mmHg. FINDINGS  Left Ventricle: Left ventricular ejection fraction, by estimation, is 65 to 70%. The left ventricle has normal function. The left ventricle has no regional wall motion abnormalities. The left ventricular internal cavity size was normal in size. There is  mild left ventricular hypertrophy. Left ventricular diastolic  parameters are consistent with Grade II diastolic dysfunction (pseudonormalization). Elevated left atrial pressure. Right Ventricle: The right ventricular size is normal. Right vetricular wall thickness was not assessed. Right ventricular systolic function is normal. There is normal pulmonary artery systolic pressure. The tricuspid regurgitant velocity is 2.67 m/s, and with an assumed right atrial pressure of 3 mmHg, the estimated right ventricular systolic pressure is 31.5 mmHg. Left Atrium: Left atrial size was moderately dilated. Right Atrium: Right atrial size was moderately dilated. Pericardium: There is no evidence of pericardial effusion. Mitral Valve: MV is thickened with mildy resricted motoin. Mean gradient through the valve is 3.5 cm2. (HR 49 bpm) Unchanged from echo on 02/10/22. There is mild thickening of the mitral valve leaflet(s). There is mild calcification of the mitral valve leaflet(s). Moderate to severe mitral annular calcification. Mild mitral valve regurgitation. MV peak gradient, 9.5 mmHg. The mean mitral valve gradient is 3.5 mmHg. Tricuspid Valve: The tricuspid valve is normal in structure. Tricuspid valve regurgitation is trivial. Aortic Valve: S/p TAVR (26 mm Medtronic CoreValve-Evolut Pro prothesis, procedure date 02/10/22) Mean gradient through the valve is 8 mm Hg. No significant change from echo done 02/10/22. The aortic valve has been repaired/replaced. Aortic valve regurgitation is not visualized. Aortic valve mean gradient measures 8.0 mmHg. Aortic valve peak gradient measures 14.8 mmHg. Aortic valve area, by VTI measures 1.87 cm. Pulmonic Valve: The pulmonic valve was normal in structure. Pulmonic valve regurgitation is trivial. Aorta: The aortic root is normal in size and structure. Venous: The inferior vena cava is normal in size with greater than 50% respiratory variability, suggesting right atrial pressure of 3 mmHg. IAS/Shunts: No atrial level shunt detected by color flow Doppler.   LEFT VENTRICLE PLAX 2D LVIDd:         4.95 cm   Diastology LVIDs:         2.90 cm   LV e' medial:    4.68 cm/s LV PW:         1.20 cm   LV E/e' medial:  31.2 LV IVS:        1.30 cm   LV e' lateral:   5.33 cm/s LVOT diam:     1.90 cm   LV E/e' lateral: 27.4 LV SV:         101 LV SV Index:   54 LVOT Area:     2.84 cm  RIGHT VENTRICLE RV S prime:     10.40 cm/s TAPSE (M-mode): 2.4 cm LEFT ATRIUM             Index        RIGHT ATRIUM           Index LA diam:  3.90 cm 2.08 cm/m   RA Area:     22.50 cm LA Vol (A2C):   80.8 ml 43.00 ml/m  RA Volume:   65.70 ml  34.96 ml/m LA Vol (A4C):   72.4 ml 38.53 ml/m LA Biplane Vol: 80.6 ml 42.89 ml/m  AORTIC VALVE AV Area (Vmax):    1.84 cm AV Area (Vmean):   1.99 cm AV Area (VTI):     1.87 cm AV Vmax:           192.50 cm/s AV Vmean:          133.500 cm/s AV VTI:            0.538 m AV Peak Grad:      14.8 mmHg AV Mean Grad:      8.0 mmHg LVOT Vmax:         125.00 cm/s LVOT Vmean:        93.900 cm/s LVOT VTI:          0.355 m LVOT/AV VTI ratio: 0.66  AORTA Ao Root diam: 2.90 cm MITRAL VALVE                TRICUSPID VALVE MV Area (PHT): 2.54 cm     TR Peak grad:   28.5 mmHg MV Area VTI:   1.54 cm     TR Vmax:        267.00 cm/s MV Peak grad:  9.5 mmHg MV Mean grad:  3.5 mmHg     SHUNTS MV Vmax:       1.54 m/s     Systemic VTI:  0.36 m MV Vmean:      89.8 cm/s    Systemic Diam: 1.90 cm MV Decel Time: 299 msec MV E velocity: 146.00 cm/s MV A velocity: 140.00 cm/s MV E/A ratio:  1.04 Dietrich Pates MD Electronically signed by Dietrich Pates MD Signature Date/Time: 05/11/2022/1:09:27 PM    Final    MR BRAIN WO CONTRAST  Result Date: 05/10/2022 CLINICAL DATA:  Syncope with cerebrovascular cause suspected EXAM: MRI HEAD WITHOUT CONTRAST TECHNIQUE: Multiplanar, multiecho pulse sequences of the brain and surrounding structures were obtained without intravenous contrast. COMPARISON:  None Available. FINDINGS: Brain: Tiny foci of restricted diffusion scattered along the bilateral  peripheral cerebral hemispheres, cortical to subcortical in location. Patchy involvement in the right occipital lobe and in the posterior right frontal white matter has a weak diffusion signal suggesting more subacute timing. Tiny chronic infarcts in the bilateral cerebral cortex. Chronic lacunar infarcts in the left deep cerebral white matter. Brain atrophy that is generalized. Vascular: Major flow voids are preserved Skull and upper cervical spine: Normal marrow signal. Sinuses/Orbits: Unremarkable. IMPRESSION: 1. Scattered tiny acute to subacute infarcts suggesting central embolic disease. 2. Brain atrophy and chronic small vessel ischemia with small chronic cerebellar infarcts bilaterally. Electronically Signed   By: Tiburcio Pea M.D.   On: 05/10/2022 04:13   DG Chest Portable 1 View  Result Date: 05/10/2022 CLINICAL DATA:  Near syncope EXAM: PORTABLE CHEST 1 VIEW COMPARISON:  04/12/2022 FINDINGS: Cardiomegaly. Aortic atherosclerosis. No confluent opacities, effusions or edema. No acute bony abnormality. IMPRESSION: Cardiomegaly.  No active disease. Electronically Signed   By: Charlett Nose M.D.   On: 05/10/2022 01:12    Labs:  Basic Metabolic Panel: No results for input(s): "NA", "K", "CL", "CO2", "GLUCOSE", "BUN", "CREATININE", "CALCIUM", "MG", "PHOS" in the last 168 hours.   CBC: No results for input(s): "WBC", "NEUTROABS", "HGB", "HCT", "MCV", "PLT" in the last 168  hours.   CBG: No results for input(s): "GLUCAP" in the last 168 hours.  Family history.  Mother with dementia father with CAD.  Denies any colon cancer esophageal cancer or rectal cancer  Brief HPI:   Rebekah Pope is a 81 y.o.right handed female with history of CKD with creatinine baseline 1.20-1.30, diastolic congestive heart failure, left bundle branch block PAF/aortic stenosis status post TAVR are 02/10/2022 maintained on Xarelto as well as amiodarone.  Per chart review lives with spouse.  Presented 05/10/2022 with  sudden onset of nausea dizziness double vision and weakness with emesis.  MRI showed scattered tiny acute to subacute infarcts suggesting central embolic disease.  Admission chemistries unremarkable except BUN 32 creatinine 1.17, troponin 105-114.  Echo with ejection fraction of 65 to 70% no wall motion abnormalities grade 2 diastolic dysfunction.  Neurology follow-up and Xarelto transitioned to Eliquis.  Elevated troponin felt to be related to demand ischemia.  Awaiting plan for evaluation of pacemaker as outpatient.  Tolerating a regular diet.  Therapy evaluations completed due to patient decreased functional mobility was admitted for a comprehensive rehab program.   Hospital Course: Rebekah Pope was admitted to rehab 05/12/2022 for inpatient therapies to consist of PT, ST and OT at least three hours five days a week. Past admission physiatrist, therapy team and rehab RN have worked together to provide customized collaborative inpatient rehab.  Pertaining to patient's small acute ischemic infarct bilateral cerebral hemispheres likely secondary to small vessel disease remained stable patient would follow-up neurology services and remained on Eliquis therapy.  History of PAF aortic stenosis status post TAVR procedure 02/10/2022.  Evaluation cardiology services for possible pacemaker to be addressed as outpatient.  Patient's cardiac rate remained controlled continue low-dose amiodarone.  Permissive hypertension and monitored with follow-up per cardiology services.  Hypothyroidism with Synthroid as advised.  CKD with latest creatinine 1.19 and BUN 22 and encouragement of fluids noted.  Diastolic congestive heart failure exhibiting no signs of fluid overload.  Hyperlipidemia Lipitor as directed.   Blood pressures were monitored on TID basis and controlled     Rehab course: During patient's stay in rehab weekly team conferences were held to monitor patient's progress, set goals and discuss barriers to  discharge. At admission, patient required moderate assist sit to stand minimal assist 75 feet rolling walker  Physical exam.  Blood pressure 168/69 pulse 45 temperature 97.9 respirations 18 oxygen saturation is 96% room air Constitutional.  No acute distress HEENT Head.  Normocephalic and atraumatic Eyes.  Pupils round and reactive to light no discharge without nystagmus Neck.  Supple nontender no JVD without thyromegaly Cardiac regular rate and rhythm without any extra sounds or murmur heard Abdomen.  Soft nontender positive bowel sounds without rebound Respiratory effort normal no respiratory distress without wheeze Skin.  Warm and dry Neurologic.  Alert and oriented x 3 normal insight and awareness.  Normal language and speech.  Cranial nerve exam unremarkable.  Does have probably some depth perception and diplopia particularly with vertical gaze and gaze to the right.  Motor 5/5 upper and lower extremities.  Sensation intact.  He/She  has had improvement in activity tolerance, balance, postural control as well as ability to compensate for deficits. He/She has had improvement in functional use RUE/LUE  and RLE/LLE as well as improvement in awareness.  Sit to stand rolling walker supervision.  Ambulates 160 feet rolling walker and contact-guard without assistive device.  Ambulates within the kitchen of the ADL apartment rolling walker standby assist.  Patient demonstrates good ability to scan throughout tasks.  She can gather belongings for activities of daily living and homemaking.  Sit to stand rolling walker and ambulation to the bathroom.  Full family teaching completed plan discharge to home       Disposition: Discharge to home    Diet: Regular  Special Instructions: No driving smoking or alcohol  Medications at discharge 1.  Tylenol as needed 2.  Amiodarone 100 mg p.o. daily 3.  Norvasc 10 mg p.o. daily 4.  Eliquis 5 mg p.o. twice daily 5.  Lipitor 80 mg p.o. daily 6.   Hydralazine 25 mg p.o. every 8 hours 7.  Synthroid 112 mcg p.o. daily 8.  Oxycodone 5 mg every 4 hours as needed pain 9.  Entresto 24-26 mg 1 tablet p.o. twice daily  30-35 minutes were spent completing discharge summary and discharge planning  Discharge Instructions     Ambulatory referral to Neurology   Complete by: As directed    An appointment is requested in approximately: 4 weeks ischemic bilateral cerebral infarction   Ambulatory referral to Physical Medicine Rehab   Complete by: As directed    Moderate complexity follow-up 1 to 2 weeks ischemic bilateral cerebellar infarction        Follow-up Information     Kirsteins, Victorino SparrowAndrew E, MD Follow up.   Specialty: Physical Medicine and Rehabilitation Why: Office to call for appointment Contact information: [address] [place] [address] [phone]         Baldo DaubMunley, Brian J, MD Follow up.   Specialties: Cardiology, Radiology Why: Call for appointment 06/11/2022 Contact information: [address] [place] [address] [phone]         Duke SalviaKlein, Steven C, MD Follow up.   Specialty: Cardiology Why: Evaluation for pacemaker 06/05/2022 Contact information: [address] [place] [address] [phone]                 Signed: Mcarthur RossettiDaniel J Tonda Wiederhold 05/21/2022, 5:21 AM

## 2022-05-18 NOTE — Progress Notes (Signed)
Occupational Therapy Session Note  Patient Details  Name: Rebekah Pope MRN: [id number] Date of Birth: May 05, 1941  Today's Date: 05/18/2022 OT Individual Time: 1130-1200 OT Individual Time Calculation (min): 30 min    Short Term Goals: Week 1:  OT Short Term Goal 1 (Week 1): Pt will visually scan within R and L visual fields to identify items needed for grooming tasks with min verbal cues OT Short Term Goal 2 (Week 1): Pt will utilize compensatory strategies for diplopia during functional tasks with min A OT Short Term Goal 3 (Week 1): Pt will complete LB dressing CGA  Skilled Therapeutic Interventions/Progress Updates:   Pt seen for follow up visual perceptual focussed visit from last week. Pt reports significant improvement in diplopia now only intermittent. OT removed all taping and re-applied nasally on NDE however less coverage until pt reported diplopia subsided. Pt reports she has been diligent in ocular motor smooth pursuits, convergence and with use of Luvenia Redden with therapy. OT will fabricated for home program. OT added saccades exercises and pt required min cues initially to maintain central head position and only move eyes, but improved to independent teach back. Pt demonstrates only a mild delay in R eye ROM with basic scanning but becomes fatigued as therex or activity becomes more complex or rapid. Written HEP updated. OT also provided pt information if vision issues persist for [facility] neuro opthalmology with Emi Holes MD. Pt left with chair exit alarm set, needs and nurse call button within reach. Care cood with primary OT to continue to challenge scanning with r eye and reduce taping as needed.    Therapy Documentation Precautions:  Precautions Precautions: Fall, Other (comment) Precaution Comments: diplopia, dysconjugate gaze Restrictions Weight Bearing Restrictions: No   Therapy/Group: Individual Therapy  Vicenta Dunning 05/18/2022, 7:55 AM

## 2022-05-18 NOTE — Progress Notes (Addendum)
Physical Therapy Session Note  Patient Details  Name: Rebekah Pope MRN: [id number] Date of Birth: 1941/03/18  Today's Date: 05/18/2022 PT Individual Time: 1302-1358 PT Individual Time Calculation (min): 56 min   Short Term Goals: Week 1:  PT Short Term Goal 1 (Week 1): = to LTGs based on ELOS  Skilled Therapeutic Interventions/Progress Updates:     Pt received seated in [facility] and agrees to therapy. No complaint of pain. WC transport to gym for time management. Pt performs stand step transfer to mat with cues for sequencing and positioning. Pt performs sit to stand with cues for initiation. Pt ambulates x250' with CGA and cues for upright gaze to improve posture and balance, and cues to attend to R side especially while turning to the R.   Pt performs x20 toe taps on 3 inch step with mirror for visual feedback. 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 PT provides rest breaks to manage pain.   Pt performs x10 L<>R trunk rotations in standing holding onto 1kg medicine ball with bilateral upper extremities, with cues for body mechanics. Performed for core strengthening as well as balance challenge with weigh transitioning laterally.  Pt ambulates x150' to dayroom with CGA and same cueing. Pt then completes Nustep for endurance training. PT provides cues for hand and foot placement and completing full available ROM. Pt completes x10:00 at workload of 4 with average steps per minute ~50.  WC transport back to room. Left seated with all needs within reach.  Therapy Documentation Precautions:  Precautions Precautions: Fall, Other (comment) Precaution Comments: diplopia, dysconjugate gaze Restrictions Weight Bearing Restrictions: No   Therapy/Group: Individual Therapy  Beau Fanny, PT, DPT 05/18/2022, 5:39 PM

## 2022-05-18 NOTE — Progress Notes (Signed)
Occupational Therapy Session Note  Patient Details  Name: Rebekah Pope MRN: [id number] Date of Birth: 1941/03/01  Today's Date: 05/18/2022 OT Individual Time: 1423-9532 OT Individual Time Calculation (min): 60 min    Short Term Goals: Week 1:  OT Short Term Goal 1 (Week 1): Pt will visually scan within R and L visual fields to identify items needed for grooming tasks with min verbal cues OT Short Term Goal 2 (Week 1): Pt will utilize compensatory strategies for diplopia during functional tasks with min A OT Short Term Goal 3 (Week 1): Pt will complete LB dressing CGA  Skilled Therapeutic Interventions/Progress Updates:    Upon OT arrival, pt seated in w/c reporting no pain and is agreeable to OT session. Treatment intervention with a focus on visual perception and strengthening. Pt was transported to ortho gym to engage in various tasks on [employer]. Pt completes visual scanning on interactive mode where pt taps dot on one side of visual field, scans with eyes to follow it to opposite side of board then taps it again before it leaves the screen. Pt completes from L to R in 2 min with an 88.89% accuracy and then pt completes with R to L for 2 min and 100% accuracy. Pt then completes puzzle activity scanning a letter chart horizontally and vertically to tap corresponding letter to create a word. Pt completes 7 trials and completes 6/7 successfully in 3:58 min, with a 8.21 reaction time and 85.71% accuracy. Last activity pt completes is visual pursuits task. Pt taps numbers in sequential order form 1-20 on a rotating surface moving clockwise and counterclockwise. Pt completes in 2:02 min, with a 4.06 sec reaction time and 88.24% accuracy. Pt completes all task with good activity tolerance. Pt was transported down to dayroom via w/c and total A and was situated at tabletop, Pt replicates 2 complex graphic designs of colored plastic pegs using the L UE then the R UE. First design, pt with one  error requiring 2 verbal cues to recognize error and correct. Second design, pt completes with no errors. Pt removes all pegs from peg boards without difficulty. Pt completes stand step transfer with Supervision and ambulates with Supervision to her room with RW without LOB. Pt was left in w/c at end of session with all needs met.  Therapy Documentation Precautions:  Precautions Precautions: Fall, Other (comment) Precaution Comments: diplopia, dysconjugate gaze Restrictions Weight Bearing Restrictions: No    Therapy/Group: Individual Therapy  Marvetta Gibbons 05/18/2022, 8:52 AM

## 2022-05-19 MED ORDER — HYDRALAZINE HCL 25 MG PO TABS: 25.0000 mg | ORAL_TABLET | Freq: Three times a day (TID) | ORAL | 0 refills | Status: DC

## 2022-05-19 MED ORDER — ATORVASTATIN CALCIUM 80 MG PO TABS: 80.0000 mg | ORAL_TABLET | Freq: Every day | ORAL | 0 refills | Status: DC

## 2022-05-19 MED ORDER — AMLODIPINE BESYLATE 10 MG PO TABS: 10.0000 mg | ORAL_TABLET | Freq: Every day | ORAL | 0 refills | Status: DC

## 2022-05-19 MED ORDER — AMIODARONE HCL 200 MG PO TABS: 100.0000 mg | ORAL_TABLET | Freq: Every day | ORAL | 0 refills | Status: DC

## 2022-05-19 MED ORDER — OXYCODONE HCL 5 MG PO TABS: 5.0000 mg | ORAL_TABLET | ORAL | 0 refills | Status: DC | PRN

## 2022-05-19 MED ORDER — APIXABAN 5 MG PO TABS: 5.0000 mg | ORAL_TABLET | Freq: Two times a day (BID) | ORAL | 0 refills | Status: DC

## 2022-05-19 MED ORDER — LEVOTHYROXINE SODIUM 112 MCG PO TABS: 112.0000 ug | ORAL_TABLET | Freq: Every day | ORAL | 0 refills | Status: DC

## 2022-05-19 MED ORDER — ACETAMINOPHEN 325 MG PO TABS: 650.0000 mg | ORAL_TABLET | Freq: Four times a day (QID) | ORAL | Status: DC | PRN

## 2022-05-19 MED ORDER — ENTRESTO 24-26 MG PO TABS: 1.0000 | ORAL_TABLET | Freq: Two times a day (BID) | ORAL | 0 refills | Status: DC

## 2022-05-19 NOTE — Progress Notes (Signed)
Occupational Therapy Session Note  Patient Details  Name: Rebekah Pope MRN: [id number] Date of Birth: 07-08-1940  Today's Date: 05/19/2022 OT Individual Time: 1100-1200 OT Individual Time Calculation (min): 60 min    Short Term Goals: Week 1:  OT Short Term Goal 1 (Week 1): Pt will visually scan within R and L visual fields to identify items needed for grooming tasks with min verbal cues OT Short Term Goal 2 (Week 1): Pt will utilize compensatory strategies for diplopia during functional tasks with min A OT Short Term Goal 3 (Week 1): Pt will complete LB dressing CGA  Skilled Therapeutic Interventions/Progress Updates:     Pt received sitting in wc in good spirits and motivated to participate in skilled OT session. Pt dressed with all BADL needs met upon OT arrival. Pt reporting her double vision is improving, but she is seeing double when turning her head quickly and changing her focus of vision when looking to the left. Pt also reporting she feels as though her depth perception is "still off" d/t undershooting when reaching for her bed side table when is is positioned to her left.   Pt ambulated from room to ADL apartment using RW ~150 ft with close supervision. Pt instructed to visually scan from R to L during ambulation to locate room numbers. Pt able to locate and verbalize 100% of room numbers with min verbal cues to locate one number positioned in her R visual field. Pt wearing tapped glasses during task, no double vision reported.   Pt completed functional visual scanning activity with dual-tasking challenge of locating fruits spread out on kitchen counter. Pt instructed to retrieve specific fruit and place it in bowl while ambulating in kitchen without AD. Pt did not wear taped glasses during activity to increase visual scanning challenge for R eye and to further assess impact of double vision. Pt demonstrating improved depth perception and increase accuracy when reaching for  fruits and placing them in bowl. Pt able to complete task with 100% accuracy when locating fruits and CGA for ambulation. Pt provided rest break following task d/t decreased activity tolerance.   Pt ambulated back to room without AD CGA to grade up task and instructed to read room numbers without tapped glasses to further assess double vision occurrence. Pt able to locate and read 100% of room numbers during activity reporting mild double vision when quickly turning head to R side.   Pt was left resting in wc with call bell in reach, seat belt alarm on, and all needs met.   Therapy Documentation Precautions:  Precautions Precautions: Fall, Other (comment) Precaution Comments: diplopia, dysconjugate gaze Restrictions Weight Bearing Restrictions: No General:   Vital Signs: Therapy Vitals Pulse Rate: (Abnormal) 47 BP: (Abnormal) 128/51 Pain:   ADL: ADL Eating: Supervision/safety Where Assessed-Eating: Edge of bed Grooming: Setup Where Assessed-Grooming: Edge of bed Upper Body Bathing: Supervision/safety Where Assessed-Upper Body Bathing: Shower Lower Body Bathing: Contact guard Where Assessed-Lower Body Bathing: Shower Upper Body Dressing: Supervision/safety Where Assessed-Upper Body Dressing: Edge of bed Lower Body Dressing: Minimal assistance Where Assessed-Lower Body Dressing: Edge of bed Toileting: Contact guard Where Assessed-Toileting: Glass blower/designer: Therapist, music Method: Counselling psychologist: Energy manager: Curator Method: Heritage manager: Civil engineer, contracting with back Research officer, political party   Exercises:   Other Treatments:     Therapy/Group: Individual Therapy  Janey Genta 05/19/2022, 11:35 AM

## 2022-05-19 NOTE — Progress Notes (Addendum)
Patient ID: Rebekah Pope, female   DOB: Jul 12, 1940, 81 y.o.   MRN: [id number]  [facility] referral sent to [facility] per pt and daughter, Rebekah Pope request. Patient approved for RN/PT/OT, orders sent.  SW followed up with patient to address questions or concerns. None reported, patient ready for d/c.

## 2022-05-19 NOTE — Progress Notes (Signed)
Slept well last night with occasional visits to bathroom. Ambulatory with walker, with minimal assistance. Denies pain. VS stable. Safety maintained at all times.

## 2022-05-19 NOTE — Progress Notes (Signed)
Physical Therapy Session Note  Patient Details  Name: Rebekah Pope MRN: [id number] Date of Birth: 1941-02-13  Today's Date: 05/19/2022 PT Individual Time: [phone] and [id number] PT Individual Time Calculation (min): 59 min and 71 min  Short Term Goals: Week 1:  PT Short Term Goal 1 (Week 1): = to LTGs based on ELOS  Skilled Therapeutic Interventions/Progress Updates:    Session 1: Pt received sitting in w/c and eager to participate in therapy session. Sit<>stand using RW with distant supervision during session. Pt ambulated around room to collect her clothing using RW with supervision - educated pt on getting a bag to place on RW to allow her to carry items due to B UE support needing to remain on RW. Gait in/out bathroom using RW, pt opening door, with supervision. Sitting on toilet, donned UB and LB clothing as well as shoes without assist. Sit<>stand toilet<>RW with distant supervision. Continent of bladder and seated peri-care without assist. Pt able to stand with R UE support on grab bar and pick-up items from floor with supervision, no instability noted. Standing at sink completed hand hygiene, hair brushing, and oral care without assist. When pt reaching for items noticed still some undershooting with pt verbalizing that her perception is still off.  Gait training ~150ft to main therapy gym using RW with supervision - pt noted to have RW veering towards L and pt standing near R back corner of AD - pt stated she is trying to look up more while ambulating and it makes her feel more "wobbly."  Educated pt on how to navigate a curb step (4" height) using RW - pt demos understanding x2 with supervision for safety and excellent technique.  Dynamic gait training, no AD, with dual-task challenge of locating numbered disks in hallway 1-12 (evens then odds) - pt with B knee pain (L>R) during gait requiring use of UE support on hallway rail throughout to maintain balance - pt with significant  difficulty visually scanning whole environment to see where the disks are located in hallway (has to scan very slowly and in small sections while discerning each, individual item she sees due to still having double vision), took >6 minutes to locate the even numbers - provided pt with RW to find the odds and did having improved upright gaze trying to scan a larger portion of the environment but even once pt standing ~12ft from the first target she still could not see it directly in front of her.   Gait training back to room using RW with supervision transitioned to CGA for safety towards end due to fatigue and pt starting to flex forward and report continued B  knee discomfort.  At end of session, pt left seated in w/c with needs in reach and seat belt alarm on.    Session 2: Pt received sitting in w/c with her husband, Gerlene Burdock, and son, Loraine Leriche, present and pt agreeable to therapy session and family agreeable to participate in education during session.   Therapist educated family on the following:  - recommendation for follow-up OPPT with focus on oculomotor/vision as well as dynamic balance and dynamic gait with LRAD  - recommendation (rec') for pt to use RW for all functional mobility with pt's need for 24hr supervision, which pt's husband has agreed to provide  - rec' to place pt's necessary items at counter height where they are easily accessible - need for RW bag to carry items so pt can maintain B UE support on AD at all  times - educated on pt's visual impairments and her intermittently having difficulty visually locating items despite them being in her "visual field"   Transported pt to/from gym for energy conservation.  Pt ambulated up/down ramp using RW with pt's husband providing distant supervision. Performed simulated (small SUV height) ambulatory car transfer using RW with pt's husband providing distant supervision and therapist educating him on pt's need for him to manage placing AD  in/out vehicle and he reports feeling comfortable assisting with this.  Stair navigation training ascending/descending 4 steps (6" height) using B HRs with CGA/close supervision and pt performing step-to pattern leading with R LE on ascent and L LE on descent.   Pt performed step up/down on/off 1 (4" height) curb step x2 using RW to simulate home entry/exit from the carport/garage - pt's husband providing supervision.  Family reports pt has 2STE to get on/off porch. Therapist set-up 4" step in front of the 8" curb step and educated pt on how to manage RW to navigate 2 steps, given all 4 points of the AD will not fit on the 1st step - pt repeated x5 reps requiring max progressed to question cuing for proper placement of AD - CGA/close supervision for safety.   Family reports no questions/concerns and report feeling comfortable and confident to care for pt at D/C.  Pt participated in functional gait training in ADL apartment using RW to navigate in kitchen and locate fruit in cabinets and top drawers - pt does better at locating the fruit compared to the numbered disks in AM session; however, did have 1 instance where pt placed 4 pieces of fruit next to one another and then a 5th piece ~6inches to the L and pt unable to see it until ambulating up closer to the counter.  Transported back to her room - reports need to use bathroom. Gait in/out bathroom using RW supervision. Pt completes 3/3 toileting tasks with distant supervision - continent of bladder and bowels. At end of session, pt left seated in w/c with needs in reach, seat belt alarm on, and her husband present.    Therapy Documentation Precautions:  Precautions Precautions: Fall, Other (comment) Precaution Comments: diplopia, dysconjugate gaze Restrictions Weight Bearing Restrictions: No   Pain:  Session 1: Reports B knee pain - noted to change her movement patterns and require UE support for standing/gait tasks due to the pain but pt  declines medication - provided seated rest breaks for pain management.  Session 2: No reports of pain throughout session.   Therapy/Group: Individual Therapy  Ginny Forth , PT, DPT, NCS, CSRS 05/19/2022, 7:51 AM

## 2022-05-19 NOTE — Progress Notes (Signed)
PROGRESS NOTE   Subjective/Complaints:  Has EP appt 12/29 and primary cardiology appt 1/4 No syncopal episode   ROS: Patient denies CP, SOB, N/V/D  Objective:   No results found. No results for input(s): "WBC", "HGB", "HCT", "PLT" in the last 72 hours.  No results for input(s): "NA", "K", "CL", "CO2", "GLUCOSE", "BUN", "CREATININE", "CALCIUM" in the last 72 hours.   Intake/Output Summary (Last 24 hours) at 05/19/2022 0836 Last data filed at 05/19/2022 6226 Gross per 24 hour  Intake 716 ml  Output --  Net 716 ml      Pressure Injury 05/12/22 Coccyx Left Deep Tissue Pressure Injury - Purple or maroon localized area of discolored intact skin or blood-filled blister due to damage of underlying soft tissue from pressure and/or shear. (Active)  05/12/22 1600  Location: Coccyx  Location Orientation: Left  Staging: Deep Tissue Pressure Injury - Purple or maroon localized area of discolored intact skin or blood-filled blister due to damage of underlying soft tissue from pressure and/or shear.  Wound Description (Comments):   Present on Admission: Yes     Pressure Injury 05/12/22 Coccyx Right Deep Tissue Pressure Injury - Purple or maroon localized area of discolored intact skin or blood-filled blister due to damage of underlying soft tissue from pressure and/or shear. (Active)  05/12/22 1835  Location: Coccyx  Location Orientation: Right  Staging: Deep Tissue Pressure Injury - Purple or maroon localized area of discolored intact skin or blood-filled blister due to damage of underlying soft tissue from pressure and/or shear.  Wound Description (Comments):   Present on Admission: Yes    Physical Exam: Vital Signs Blood pressure (!) 128/51, pulse (!) 47, temperature 97.6 F (36.4 C), resp. rate 16, height 5\' 3"  (1.6 m), weight 84.7 kg, SpO2 96 %.   General: No acute distress Mood and affect are appropriate Heart: Regular  rate and rhythm no rubs murmurs or extra sounds Lungs: Clear to auscultation, breathing unlabored, no rales or wheezes Abdomen: Positive bowel sounds, soft nontender to palpation, nondistended Extremities: No clubbing, cyanosis, or edema  Skin: No evidence of breakdown, no evidence of rash Neurologic: No ptosis , upward and downward gaze ok, with forward gaze pt c/o vertical diplopia, Left pupil elevated vs right side , left pupil does not adduct past midline. Glasses taped. motor strength is 5/5 in bilateral deltoid, bicep, tricep, grip, hip flexor, knee extensors, ankle dorsiflexor and plantar flexor Cerebellar, no dysmetria FNF  Musculoskeletal: Full range of motion in all 4 extremities. No joint swelling    Assessment/Plan: 1. Functional deficits which require 3+ hours per day of interdisciplinary therapy in a comprehensive inpatient rehab setting. Physiatrist is providing close team supervision and 24 hour management of active medical problems listed below. Physiatrist and rehab team continue to assess barriers to discharge/monitor patient progress toward functional and medical goals  Care Tool:  Bathing    Body parts bathed by patient: Right arm, Left arm, Chest, Abdomen, Front perineal area, Right lower leg, Left upper leg, Right upper leg, Buttocks, Left lower leg, Face         Bathing assist Assist Level: Contact Guard/Touching assist     Upper Body Dressing/Undressing Upper  body dressing   What is the patient wearing?: Pull over shirt    Upper body assist Assist Level: Supervision/Verbal cueing    Lower Body Dressing/Undressing Lower body dressing      What is the patient wearing?: Underwear/pull up, Pants     Lower body assist Assist for lower body dressing: Minimal Assistance - Patient > 75%     Toileting Toileting    Toileting assist Assist for toileting: Contact Guard/Touching assist     Transfers Chair/bed transfer  Transfers assist      Chair/bed transfer assist level: Contact Guard/Touching assist     Locomotion Ambulation   Ambulation assist      Assist level: Minimal Assistance - Patient > 75% Assistive device: No Device Max distance: 200   Walk 10 feet activity   Assist     Assist level: Minimal Assistance - Patient > 75% Assistive device: No Device   Walk 50 feet activity   Assist    Assist level: Minimal Assistance - Patient > 75% Assistive device: No Device    Walk 150 feet activity   Assist    Assist level: Minimal Assistance - Patient > 75% Assistive device: No Device    Walk 10 feet on uneven surface  activity   Assist     Assist level: Minimal Assistance - Patient > 75% Assistive device: Hand held assist, Other (comment) (railing)   Wheelchair     Assist Is the patient using a wheelchair?: Yes (only for time management) Type of Wheelchair: Manual    Wheelchair assist level: Dependent - Patient 0%      Wheelchair 50 feet with 2 turns activity    Assist        Assist Level: Dependent - Patient 0%   Wheelchair 150 feet activity     Assist      Assist Level: Dependent - Patient 0%   Blood pressure (!) 128/51, pulse (!) 47, temperature 97.6 F (36.4 C), resp. rate 16, height 5\' 3"  (1.6 m), weight 84.7 kg, SpO2 96 %.  Medical Problem List and Plan: 1. 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  Antithrombotics: -DVT/anticoagulation:  Pharmaceutical: Xarelto changed to Eliquis               -antiplatelet therapy: N/A 3. Pain Management: Oxycodone as needed 4. Mood/Behavior/Sleep: Trazodone 50 mg nightly as needed             -antipsychotic agents: N/A 5. Neuropsych/cognition: This patient is capable of making decisions on her own behalf. 6. Skin/Wound Care: Routine skin checks 7. Fluids/Electrolytes/Nutrition: Routine in and outs with follow-up chemistries 8.  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  PAF/pacemaker.  Continue Eliquis  as well as amiodarone 100 mg daily. Appreciate Dr. 04/12/2022 consult , no need for PPM   F/u Dr Lalla Brothers 12/29 and Dr 1/30 1/4  Vitals:   05/19/22 0309 05/19/22 0827  BP: (!) 137/50 (!) 128/51  Pulse: (!) 42 (!) 47  Resp: 16   Temp: 97.6 F (36.4 C)   SpO2: 96%   Discussed permissive HTN is day 5 post CVA will start adjusting meds to reduce BP to normotensive by discharge  12/11 bp well controlled 10.  Hypothyroidism.  Synthroid 11.  CKD.  Creatinine baseline 1.20-1.30.  Follow-up chemistries    Latest Ref Rng & Units 05/13/2022    6:06 AM 05/12/2022    6:21 AM 05/10/2022    6:12 AM  BMP  Glucose 70 - 99 mg/dL 366  294  765   BUN 8 - 23 mg/dL 30  29  31    Creatinine 0.44 - 1.00 mg/dL  4.65  0.35   Sodium 135 - 145 mmol/L 136  138  139   Potassium 3.5 - 5.1 mmol/L 4.1  4.1  4.2   Chloride 98 - 111 mmol/L 107  106  106   CO2 22 - 32 mmol/L 23  24    Calcium 8.9 - 10.3 mg/dL 8.7  9.0     12.  Diastolic congestive heart failure.  Monitor for any signs of fluid overload      Filed Weights   05/13/22 0449 05/18/22 0552  Weight: 85.5 kg 84.7 kg   13.  Hyperlipidemia.  Lipitor    LOS: 7 days A FACE TO FACE EVALUATION WAS PERFORMED  14/11/23 05/19/2022, 8:36 AM

## 2022-05-20 ENCOUNTER — Institutional Professional Consult (permissible substitution): Payer: Medicare PPO | Admitting: Internal Medicine

## 2022-05-20 NOTE — Progress Notes (Signed)
Inpatient Rehabilitation Care Coordinator Discharge Note   Patient Details  Name: Rebekah Pope MRN: [id number] Date of Birth: 01-16-1941   Discharge location: Home with spouse  Length of Stay: 9 Days  Discharge activity level: Sup/cga  Home/community participation: spouse and daughter, Rebekah Pope  Patient response OM:BTDHRC Literacy - How often do you need to have someone help you when you read instructions, pamphlets, or other written material from your doctor or pharmacy?: Never  Patient response BU:LAGTXM Isolation - How often do you feel lonely or isolated from those around you?: Never  Services provided included: SW, Neuropsych, Pharmacy, TR, CM, RN, SLP, OT, PT, RD, MD  Financial Services:  Financial Services Utilized: Private Insurance [employer]  Choices offered to/list presented to: Patient and daughter, Rebekah Pope  Follow-up services arranged:  Home Health Home Health Agency: [facility] RN PT OT         Patient response to transportation need: Is the patient able to respond to transportation needs?: Yes In the past 12 months, has lack of transportation kept you from medical appointments or from getting medications?: No In the past 12 months, has lack of transportation kept you from meetings, work, or from getting things needed for daily living?: No    Comments (or additional information):  Patient/Family verbalized understanding of follow-up arrangements:  Yes  Individual responsible for coordination of the follow-up plan: Rebekah Pope (daughter)  Confirmed correct DME delivered: Andria Rhein 05/20/2022    Andria Rhein

## 2022-05-20 NOTE — Progress Notes (Signed)
Patient ID: Rebekah Pope, female   DOB: 09/02/1940, 81 y.o.   MRN: [id number]  Team Conference Report to Patient/Family  Team Conference discussion was reviewed with the patient and caregiver, including goals, any changes in plan of care and target discharge date.  Patient and caregiver express understanding and are in agreement.  The patient has a target discharge date of 05/21/22.   SW met with patient and spouse and provided team conference updates. Patient ready for d/c. No additional questions or concerns.  [name] 05/20/2022, 3:10 PM

## 2022-05-20 NOTE — Progress Notes (Signed)
PROGRESS NOTE   Subjective/Complaints:  Has EP appt 12/29 and primary cardiology appt 1/4 No syncopal episode  Discussed d/c process for tomorrow   ROS: Patient denies CP, SOB, N/V/D  Objective:   No results found. No results for input(s): "WBC", "HGB", "HCT", "PLT" in the last 72 hours.  No results for input(s): "NA", "K", "CL", "CO2", "GLUCOSE", "BUN", "CREATININE", "CALCIUM" in the last 72 hours.   Intake/Output Summary (Last 24 hours) at 05/20/2022 0830 Last data filed at 05/19/2022 1831 Gross per 24 hour  Intake 480.11 ml  Output --  Net 480.11 ml      Pressure Injury 05/12/22 Coccyx Left Deep Tissue Pressure Injury - Purple or maroon localized area of discolored intact skin or blood-filled blister due to damage of underlying soft tissue from pressure and/or shear. (Active)  05/12/22 1600  Location: Coccyx  Location Orientation: Left  Staging: Deep Tissue Pressure Injury - Purple or maroon localized area of discolored intact skin or blood-filled blister due to damage of underlying soft tissue from pressure and/or shear.  Wound Description (Comments):   Present on Admission: Yes     Pressure Injury 05/12/22 Coccyx Right Deep Tissue Pressure Injury - Purple or maroon localized area of discolored intact skin or blood-filled blister due to damage of underlying soft tissue from pressure and/or shear. (Active)  05/12/22 1835  Location: Coccyx  Location Orientation: Right  Staging: Deep Tissue Pressure Injury - Purple or maroon localized area of discolored intact skin or blood-filled blister due to damage of underlying soft tissue from pressure and/or shear.  Wound Description (Comments):   Present on Admission: Yes    Physical Exam: Vital Signs Blood pressure (!) 127/53, pulse (!) 43, temperature 98.5 F (36.9 C), resp. rate 18, height _0  (1.6 m), weight 84.7 kg, SpO2 97 %.   General: No acute distress Mood  and affect are appropriate Heart: Regular rate and rhythm no rubs murmurs or extra sounds Lungs: Clear to auscultation, breathing unlabored, no rales or wheezes Abdomen: Positive bowel sounds, soft nontender to palpation, nondistended Extremities: No clubbing, cyanosis, or edema  Skin: No evidence of breakdown, no evidence of rash Neurologic: No ptosis , upward and downward gaze ok, with forward gaze pt c/o vertical diplopia, Left pupil elevated vs right side , left pupil does not adduct past midline. Glasses taped. motor strength is 5/5 in bilateral deltoid, bicep, tricep, grip, hip flexor, knee extensors, ankle dorsiflexor and plantar flexor Cerebellar, no dysmetria FNF  Musculoskeletal: Full range of motion in all 4 extremities. No joint swelling    Assessment/Plan: 1. Functional deficits which require 3+ hours per day of interdisciplinary therapy in a comprehensive inpatient rehab setting. Physiatrist is providing close team supervision and 24 hour management of active medical problems listed below. Physiatrist and rehab team continue to assess barriers to discharge/monitor patient progress toward functional and medical goals  Care Tool:  Bathing    Body parts bathed by patient: Right arm, Left arm, Chest, Abdomen, Front perineal area, Right lower leg, Left upper leg, Right upper leg, Buttocks, Left lower leg, Face         Bathing assist Assist Level: Contact Guard/Touching assist  Upper Body Dressing/Undressing Upper body dressing   What is the patient wearing?: Pull over shirt    Upper body assist Assist Level: Supervision/Verbal cueing    Lower Body Dressing/Undressing Lower body dressing      What is the patient wearing?: Underwear/pull up, Pants     Lower body assist Assist for lower body dressing: Minimal Assistance - Patient > 75%     Toileting Toileting    Toileting assist Assist for toileting: Contact Guard/Touching assist     Transfers Chair/bed  transfer  Transfers assist     Chair/bed transfer assist level: Supervision/Verbal cueing Chair/bed transfer assistive device: Walker, Clinical biochemist   Ambulation assist      Assist level: Supervision/Verbal cueing Assistive device: Walker-rolling Max distance: 165f   Walk 10 feet activity   Assist     Assist level: Minimal Assistance - Patient > 75% Assistive device: No Device   Walk 50 feet activity   Assist    Assist level: Minimal Assistance - Patient > 75% Assistive device: No Device    Walk 150 feet activity   Assist    Assist level: Minimal Assistance - Patient > 75% Assistive device: No Device    Walk 10 feet on uneven surface  activity   Assist     Assist level: Minimal Assistance - Patient > 75% Assistive device: Hand held assist, Other (comment) (railing)   Wheelchair     Assist Is the patient using a wheelchair?: Yes (only for time management) Type of Wheelchair: Manual    Wheelchair assist level: Dependent - Patient 0%      Wheelchair 50 feet with 2 turns activity    Assist        Assist Level: Dependent - Patient 0%   Wheelchair 150 feet activity     Assist      Assist Level: Dependent - Patient 0%   Blood pressure (!) 127/53, pulse (!) 43, temperature 98.5 F (36.9 C), resp. rate 18, height _0  (1.6 m), weight 84.7 kg, SpO2 97 %.  Medical Problem List and Plan: 1. 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 Formulized individual treatment plan based on medical  history, underlying problem and comorbidities.              -pt has eye patch which her daughter brought- wear OD but also alternate intermittently- if no sig improvements in 4-5  mo will need neuro optho eval  2.  Antithrombotics: -DVT/anticoagulation:  Pharmaceutical: Xarelto changed to Eliquis              -antiplatelet therapy: N/A 3. Pain Management: Oxycodone as needed 4. Mood/Behavior/Sleep: Trazodone 50 mg nightly as needed             -antipsychotic agents: N/A 5. Neuropsych/cognition: This patient is capable of making decisions on her own behalf. 6. Skin/Wound Care: Routine skin checks 7. Fluids/Electrolytes/Nutrition: Routine in and outs with follow-up chemistries 8.  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  PAF/pacemaker.  Continue Eliquis  as  well as amiodarone 100 mg daily. Appreciate Dr. Quentin Ore consult , no need for PPM   F/u Dr Caryl Comes 12/29 and Dr Bettina Gavia 1/4      Vitals:   05/20/22 0346 05/20/22 0619  BP: 122/67 (!) 127/53  Pulse: (!) 45 (!) 43  Resp: 18   Temp: 98.5 F (36.9 C)   SpO2: 97% 97%  Discussed permissive HTN is day 5 post CVA will start adjusting meds to reduce BP to normotensive by discharge  12/11 bp well controlled 10.  Hypothyroidism.  Synthroid 11.  CKD.  Creatinine baseline 1.20-1.30.  Follow-up chemistries    Latest Ref Rng & Units 05/13/2022    6:06 AM 05/12/2022    6:21 AM 05/10/2022    6:12 AM  BMP  Glucose 70 - 99 mg/dL 102  104  120   BUN 8 - 23 mg/dL _0 Creatinine 0.44 - 1.00 mg/dL 1.34  1.27  1.00   Sodium 135 - 145 mmol/L 136  138  139   Potassium 3.5 - 5.1 mmol/L 4.1  4.1  4.2   Chloride 98 - 111 mmol/L 107  106  106   CO2 22 - 32 mmol/L 23  24    Calcium 8.9 - 10.3 mg/dL 8.7  9.0     12.  Diastolic congestive heart failure.  Monitor for any signs of fluid overload      Filed Weights   05/13/22 0449 05/18/22 0552  Weight: 85.5 kg 84.7 kg   13.  Hyperlipidemia.  Lipitor    LOS: 8 days A FACE TO FACE  EVALUATION WAS PERFORMED  Charlett Blake 05/20/2022, 8:30 AM

## 2022-05-20 NOTE — Progress Notes (Signed)
Followed up patient she claims she feels better after being in bed; denies dizziness at this time.

## 2022-05-20 NOTE — Progress Notes (Signed)
Had a quite night. VS stable. Denies pain. No new changes to report. Possible D/C tomorrow. Safety maintained at all times.

## 2022-05-20 NOTE — Progress Notes (Signed)
Occupational Therapy Discharge Summary  Patient Details  Name: Rebekah Pope MRN: [id number] Date of Birth: 1940-11-22  Date of Discharge from [place] 13, 2023  Today's Date: 05/20/2022 OT Individual Time: 0900-1000 OT Individual Time Calculation (min): 60 min  OT Individual Time: 1500-1525 OT Individual Time Calculation (min): 60 min   Patient has met 10 of 10 long term goals due to improved activity tolerance, improved balance, postural control, ability to compensate for deficits, improved attention, improved awareness, and improved coordination.  Patient to discharge at overall Supervision level.  Patient's care partner is independent to provide the necessary physical and cognitive assistance at discharge.    Reasons goals not met: All goals met   Recommendation:  Patient will benefit from ongoing skilled OT services in home health setting to continue to advance functional skills in the area of BADL, iADL, and Reduce care partner burden.  Equipment: No equipment provided  Reasons for discharge: treatment goals met and discharge from hospital  Patient/family agrees with progress made and goals achieved: Yes  OT Discharge Skilled Interventions Progress/Updates :  AM Session: Pt received resting in bed finishing breakfast in good spirits and motivated to participate in skilled OT session. Pt reporting 0/10 pain and improvement in double vision symptoms.  Pt transitioned from sitting up in bed to EOB mod I. Pt sit>stand using RW and ambulated in her room to gather clothing close supervision. Pt ambulated to bathroom and completed 3/3 toileting tasks with supervision- min cues for hand placement during sit>stand from toilet. Pt ambulated to shower and transitioned to shower chair supervision- min cues for RW management and safety. Pt completed U/LB bathing seated on shower chair and standing to wash bottom while holding onto grab bar with supervision. Pt noted to  undershoot when reaching to turn off water- no double vision reported. Pt ambulated to room following shower completing U/LB dressing with supervision and min verbal cueing during sit>stand for safety. Pt ambulated to sink and completed grooming/hygiene tasks standing using RW close supervision to brush teeth, dry hair, and apply lotion. Pt ambulated to EOB using RW supervision.   Pt reviewed and practiced home vision exercises with OT. Pt required mod verbal cues for technique during exercises, self-correcting mistakes following verbal cues. Pt completed stand step transfer to wc using RW and was let resting in wc with call bell in reach, chair alarm on, and all needs met.    PM Session: Received update on Pt from Rn reporting the Pt is cleared to see and is no longer experiencing dizziness. Husband present in room upon OT arrival, Pt resting in bed. Pt reporting she is feeling better and requested to use restroom at beginning of session. Pt husband receptive to participating in session. Pt husband educated on safety when Pt is ambulating with RW and transfers to the toilet. Pt and husband demonstrating good safety awareness during toileting and ambulation. Pt completed 3/3 toileting tasks with supervision using RW. Pt ambulated to sink and wash hands supervision using RW. While standing, Pt reporting increase in dizziness and was instructed to rest in wc. Vitals taken- BP 146/57(MAP 84). Dizziness did not subside within ~5 minutes, therapy session discontinued for Pt safety. Pt completed stand step transfer back to bed using RW and was left with call bell in reach, bed alarm on, and all immediate needs met. Rn notified of Pt status.   Precautions/Restrictions  Precautions Precautions: Fall;Other (comment) Precaution Comments: mild diplopia Restrictions Weight Bearing Restrictions: No   Vital  Signs Therapy Vitals Pulse Rate: (Abnormal) 43 BP: (Abnormal) 127/53 Patient Position (if appropriate):  Lying Oxygen Therapy SpO2: 97 % Pain Pain Assessment Pain Scale: 0-10 Pain Score: 0-No pain ADL ADL Eating: Independent Where Assessed-Eating: Edge of bed Grooming: Supervision/safety Where Assessed-Grooming: Standing at sink Upper Body Bathing: Supervision/safety Where Assessed-Upper Body Bathing: Shower Lower Body Bathing: Supervision/safety Where Assessed-Lower Body Bathing: Shower Upper Body Dressing: Supervision/safety Where Assessed-Upper Body Dressing: Edge of bed Lower Body Dressing: Supervision/safety Where Assessed-Lower Body Dressing: Edge of bed Toileting: Supervision/safety Where Assessed-Toileting: Glass blower/designer: Distant supervision Armed forces technical officer Method: Counselling psychologist: Energy manager: Close supervison Clinical cytogeneticist Method: Optometrist: Civil engineer, contracting without back Social research officer, government: Close supervision Social research officer, government Method: Heritage manager: Civil engineer, contracting without back Vision Baseline Vision/History: 1 Wears glasses Patient Visual Report: Diplopia;Eye fatigue/eye pain/headache;Blurring of vision (Significant improvement from baseline; eye fatigue at end of day; diplopia when quickly turning head) Vision Assessment?: Yes Eye Alignment: Impaired (comment) (very mild dysconjugate gaze) Ocular Range of Motion: Within Functional Limits Alignment/Gaze Preference:  (Closes R eye) Tracking/Visual Pursuits: Decreased smoothness of horizontal tracking Saccades: Additional eye shifts occurred during testing Convergence: Impaired (comment) (Pt demonstrating significant improvement from baseline; R eye slow to converge) Visual Fields: No apparent deficits Diplopia Assessment: Disappears with one eye closed;Only with right gaze;Present in near gaze;Objects split on top of one another Depth Perception: Undershoots (significant improvement from baselinel undershooting  occasionally occurs when reaching to R side) Perception  Perception: Within Functional Limits Praxis Praxis: Intact Cognition Cognition Overall Cognitive Status: Within Functional Limits for tasks assessed Arousal/Alertness: Awake/alert Orientation Level: Person;Place;Situation Person: Oriented Place: Oriented Situation: Oriented Memory: Appears intact Focused Attention: Appears intact Sustained Attention: Appears intact Awareness: Appears intact Problem Solving: Appears intact Self Monitoring: Appears intact Safety/Judgment: Appears intact Brief Interview for Mental Status (BIMS) Repetition of Three Words (First Attempt): 3 Temporal Orientation: Year: Correct Temporal Orientation: Month: Accurate within 5 days Temporal Orientation: Day: Correct Recall: "Sock": Yes, no cue required Recall: "Blue": Yes, no cue required Recall: "Bed": Yes, no cue required BIMS Summary Score: 15 Sensation Sensation Light Touch: Appears Intact Hot/Cold: Appears Intact Proprioception: Appears Intact Stereognosis: Appears Intact Coordination Gross Motor Movements are Fluid and Coordinated: Yes Fine Motor Movements are Fluid and Coordinated: No Coordination and Movement Description: R UE less coordinated compaired to L UE Finger Nose Finger Test: Mild undershooting noted with RUE at beginning of assessment Motor  Motor Motor - Discharge Observations: Requires RW for balance Mobility  Bed Mobility Bed Mobility: Supine to Sit;Sit to Supine Supine to Sit: Independent with assistive device Sit to Supine: Independent with assistive device Transfers Sit to Stand: Independent with assistive device Stand to Sit: Independent with assistive device  Trunk/Postural Assessment  Cervical Assessment Cervical Assessment: Exceptions to [facility] (slight forward head) Thoracic Assessment Thoracic Assessment: Exceptions to WFL (slight kyphosis, rounded shoudlers) Lumbar Assessment Lumbar Assessment:  Exceptions to [facility] Postural Control Postural Control: Deficits on evaluation Righting Reactions: Delayed Postural Limitations: decreased  Balance Balance Balance Assessed: Yes Static Sitting Balance Static Sitting - Balance Support: Feet supported Static Sitting - Level of Assistance: 7: Independent Dynamic Sitting Balance Dynamic Sitting - Balance Support: Feet supported Dynamic Sitting - Level of Assistance: 6: Modified independent (Device/Increase time) Dynamic Sitting - Balance Activities: Forward lean/weight shifting;Reaching for objects Static Standing Balance Static Standing - Balance Support: Bilateral upper extremity supported Static Standing - Level of Assistance: 6: Modified independent (Device/Increase time);5: Stand by assistance Dynamic Standing  Balance Dynamic Standing - Balance Support: During functional activity Dynamic Standing - Level of Assistance: 5: Stand by assistance Extremity/Trunk Assessment RUE Assessment RUE Assessment: Within Functional Limits Passive Range of Motion (PROM) Comments: 5/5 LUE Assessment LUE Assessment: Within Functional Limits Passive Range of Motion (PROM) Comments: 5/5   Janey Genta 05/20/2022, 9:58 AM

## 2022-05-20 NOTE — Progress Notes (Signed)
Inpatient Rehabilitation Discharge Medication Review by a Pharmacist  A complete drug regimen review was completed for this patient to identify any potential clinically significant medication issues.  High Risk Drug Classes Is patient taking? Indication by Medication  Antipsychotic No   Anticoagulant Yes Apixaban-Afib  Antibiotic No   Opioid Yes Oxycodone prn pain  Antiplatelet No   Hypoglycemics/insulin No   Vasoactive Medication Yes Amiodarone - Afib Amlodipine, hydralazine - BP Entresto - CHF  Chemotherapy No   Other Yes Atorvastatin - HLD Levothyroxine - low thyroid     Type of Medication Issue Identified Description of Issue Recommendation(s)  Drug Interaction(s) (clinically significant)     Duplicate Therapy     Allergy     No Medication Administration End Date     Incorrect Dose     Additional Drug Therapy Needed     Significant med changes from prior encounter (inform family/care partners about these prior to discharge).    Other       Clinically significant medication issues were identified that warrant physician communication and completion of prescribed/recommended actions by midnight of the next day:  No  Pharmacist comments: None  Time spent performing this drug regimen review (minutes):  30 minutes  Thank you. Okey Regal, PharmD

## 2022-05-20 NOTE — Progress Notes (Addendum)
RN was called to room Patient c/o dizziness while sitting on her chair.BP: 154/53-154/42; HR 47-46. Patient claims this has not happened to her since she came to the unit. Therapist assisted patient to bed with after going to bathroom. Dan PA notified.

## 2022-05-20 NOTE — Patient Care Conference (Signed)
Inpatient RehabilitationTeam Conference and Plan of Care Update Date: 05/20/2022   Time: 10:36 AM    Patient Name: Rebekah Pope      Medical Record Number: [id number]  Date of Birth: 06-Nov-1940 Sex: Female         Room/Bed: 4W25C/4W25C-01 Payor Info: Payor: [employer] MEDICARE / Plan: [employer] MEDICARE CHOICE PPO / Product Type: *No Product type* /    Admit Date/Time:  05/12/2022  3:15 PM  Primary Diagnosis:  Ischemic cerebrovascular accident (CVA) [facility])  Hospital Problems: Principal Problem:   Ischemic cerebrovascular accident (CVA) [facility])    Expected Discharge Date: Expected Discharge Date: 05/21/22  Team Members Present: Physician leading conference: Dr. Claudette Laws Social Worker Present: Lavera Guise, BSW Nurse Present: Chana Bode, RN PT Present: Casimiro Needle, PT OT Present: Other (comment) Bonnell Public, OT) SLP Present: Eilene Ghazi, SLP PPS Coordinator present : Fae Pippin, SLP     Current Status/Progress Goal Weekly Team Focus  Bowel/Bladder   Continent of B/B. LBM 05/19/22   Maintain B/B function.   Assist with toileting needs    Swallow/Nutrition/ Hydration               ADL's   Diplopia is improving, occuring pprimarily when turning head toR quickly. Improved depth perception accuracy min undershooting present. Close supervision U/LB bathing and dressing, supervision grooming; close supervision toielt and shower transfers   supervision   ocularmotor retraining, visual motor training, BADL retraining, visual assessment, diplopia compensations, IADL retraining, functional mobility and balance training, safety awareness, d/c planning    Mobility   supervision bed mobility, supervision sit<>stand and stand pivot transfers using RW, supervision gait >275ft using RW, supervision stepping up/down 1 step using RW - continues to have greatest imbalance with visual scanning tasks due to continued oculomotor impairments   supervision overall at  ambulatory level  pt education, dynamic standing balance, dynamic gait training, step/stair navigation, AD training, activity tolerance, oculomotor retraining    Communication                Safety/Cognition/ Behavioral Observations               Pain   Denies pain   REmain pain free   Assess Q4 and prn    Skin   ? MASD to gluteal folds.   Promote healing  Assess QS and prn      Discharge Planning:  Patient discharging home with spouse on Thurs. HH set   Team Discussion: Patient with bilateral embolic infarcts.. Bradycardia; pacer evaluation; OP follow up with cardiologist and electrophysiologist.   Patient on target to meet rehab goals: yes, currently needs supervision overall.  *See Care Plan and progress notes for long and short-term goals.   Revisions to Treatment Plan:  N/a   Teaching Needs: Safety, medications, secondary risk management, etc  Current Barriers to Discharge: Decreased caregiver support  Possible Resolutions to Barriers: Family education completed 05/19/22 HH follow up services     Medical Summary Current Status: BP controlled , asymptomatic hypotension,  Barriers to Discharge: Hypotension;Medical stability   Possible Resolutions to [employer] Focus: cardiology/EP consult obtained, setting up follow up visits   Continued Need for Acute Rehabilitation Level of Care: The patient requires daily medical management by a physician with specialized training in physical medicine and rehabilitation for the following reasons: Direction of a multidisciplinary physical rehabilitation program to maximize functional independence : Yes Medical management of patient stability for increased activity during participation in an intensive rehabilitation regime.: Yes Analysis  of laboratory values and/or radiology reports with any subsequent need for medication adjustment and/or medical intervention. : Yes   I attest that I was present, lead the team  conference, and concur with the assessment and plan of the team.   Chana Bode B 05/20/2022, 4:03 PM

## 2022-05-20 NOTE — Progress Notes (Signed)
Physical Therapy Session Note  Patient Details  Name: Rebekah Pope MRN: [id number] Date of Birth: 19-May-1941  Today's Date: 05/20/2022 PT Individual Time: 1005-1015 and 3009-2330 PT Individual Time Calculation (min): 10 min  and 17 min and  Today's Date: 05/20/2022 PT Missed Time: 20 Minutes and 28 min and 45 min Missed Time Reason: Other (Comment) (therapist being called to assist with another patient)  Short Term Goals: Week 1:  PT Short Term Goal 1 (Week 1): = to LTGs based on ELOS  Skilled Therapeutic Interventions/Progress Updates:    Session 1: Pt received sitting in w/c and agreeable to therapy session.  Transported to/from gym in w/c for time management and energy conservation. Initiated the [employer]; however, this therapist was needed to care for another patient at this time; therefore, pt transported back to her room. Pt left seated in w/c with needs in reach and seat belt alarm on. Missed 20 minutes of skilled physical therapy.  Balance: Balance Balance Assessed: Yes Standardized Balance Assessment Standardized Balance Assessment: Berg Balance Test Berg Balance Test Sit to Stand: Able to stand  independently using hands (uses hands baseline due to bilateral knee pain) Standing Unsupported: Able to stand safely 2 minutes Sitting with Back Unsupported but Feet Supported on Floor or Stool: Able to sit safely and securely 2 minutes   Session 2: Therapist entering pt's room at same time as [name], Charity fundraiser. Pt sitting in w/c and reports she is experiencing 5-6/10 dizziness that started "a little before lunch time." Vitals assessed: BP 154/42 (MAP 73), HR 37-47bpm and RN assessing HR with stethoscope. Pt reports need to use bathroom. Transported in/out in w/c. Stand pivot w/c<>toilet using UE support on grab bar with CGA for safety - total assist for LB clothing management for time management. Continent of bladder and performed seated peri-care without assist. Pt  requesting to lie down due to dizziness symptoms worsening with this small amount of mobility. Stand pivot w/c>EOB using UE support on bedrail with CGA. Sit>supine with supervision. Pt left supine in bed with RN present, family present, needs in reach, and bed alarm on. Missed 28 minutes of skilled physical therapy.    Session 3: Pt received supine in bed reporting she is "still dizzy" and then states "when I move my head things start jumping around again." Pt states she had started feeling better after "Session 2" PT session but then when she got up to start moving again during most recent OT session, the dizziness started back up again. Describes the dizziness as a "lightheaded" feeling and then that when she starts looking around it looks like the items in the room "start jumping around." Pt politely declines participation in OOB mobility at this time due to wanting to avoid an exacerbation of her symptoms. Missed 45 minutes of skilled physical therapy.   Therapy Documentation Precautions:  Precautions Precautions: Fall, Other (comment) Precaution Comments: diplopia, dysconjugate gaze Restrictions Weight Bearing Restrictions: No   Pain:  Session 1: Continues to have chronic knee pain.  Session 2: No reports of pain throughout session.  Session 3: No reports of pain throughout session.     Therapy/Group: Individual Therapy  Ginny Forth , PT, DPT, NCS, CSRS 05/20/2022, 7:50 AM

## 2022-05-20 NOTE — Discharge Summary (Signed)
Physical Therapy Discharge Summary  Patient Details  Name: Rebekah Pope MRN: [id number] Date of Birth: Jun 16, 1940  Date of Discharge from PT service:May 20, 2022  Patient has met 9 of 9 long term goals due to improved activity tolerance, improved balance, improved postural control, ability to compensate for deficits, improved attention, and improved awareness.  Patient to discharge at an ambulatory level Supervision using RW. Patient's care partner attended education/training and is independent to provide the necessary physical assistance at discharge.  All goals met.  Recommendation:  Patient will benefit from ongoing skilled PT services in outpatient setting; however, family requesting to start with home health therapy to ensure pt safety in the house, to continue to advance safe functional mobility, address ongoing impairments in dynamic standing balance, dynamic gait training using LRAD, oculomotor/vision retraining, and minimize fall risk.  Equipment: No equipment provided, pt has RW  Reasons for discharge: treatment goals met and discharge from hospital  Patient/family agrees with progress made and goals achieved: Yes   PT Discharge Precautions/Restrictions Precautions Precautions: Fall;Other (comment) Precaution Comments: mild diplopia with impaired oculomotor function Restrictions Weight Bearing Restrictions: No Pain Pain Assessment Pain Scale: 0-10 Pain Score: 0-No pain Pain Interference Pain Interference Pain Effect on Sleep: 1. Rarely or not at all Pain Interference with Therapy Activities: 1. Rarely or not at all Pain Interference with Day-to-Day Activities: 1. Rarely or not at all Vision/Perception  Vision - History Ability to See in Adequate Light: 0 Adequate Vision - Assessment Tracking/Visual Pursuits: Decreased smoothness of horizontal tracking (when looking towards the R: L eye does not track medially and R eye overshoots  laterally) Perception Perception: Within Functional Limits Praxis Praxis: Intact  Cognition Overall Cognitive Status: Within Functional Limits for tasks assessed Arousal/Alertness: Awake/alert Orientation Level: Oriented X4 Year: 2023 Month: December Day of Week: Correct Attention: Focused;Sustained Focused Attention: Appears intact Sustained Attention: Appears intact Memory: Appears intact Awareness: Appears intact Problem Solving: Appears intact Safety/Judgment: Appears intact Sensation Sensation Light Touch: Appears Intact Hot/Cold: Not tested Proprioception: Appears Intact Stereognosis: Not tested Coordination Gross Motor Movements are Fluid and Coordinated: Yes Motor  Motor Motor: Other (comment) Motor - Discharge Observations: Requires RW for balance  Mobility Bed Mobility Bed Mobility: Supine to Sit;Sit to Supine Supine to Sit: Independent with assistive device Sit to Supine: Independent with assistive device Transfers Transfers: Sit to Stand;Stand to Sit;Stand Pivot Transfers Sit to Stand: Independent with assistive device Stand to Sit: Independent with assistive device Stand Pivot Transfers: Supervision/Verbal cueing Transfer (Assistive device): Rolling walker Locomotion  Gait Ambulation: Yes Gait Assistance: Supervision/Verbal cueing Gait Distance (Feet): 150 Feet Assistive device: Rolling walker Gait Assistance Details: Verbal cues for precautions/safety;Verbal cues for gait pattern Gait Gait: Yes Gait Pattern: Within Functional Limits (using RW with some slight gait deviations due to chronic B knee pain but WFL) Gait velocity: decreased Stairs / Additional Locomotion Stairs: Yes Stairs Assistance: Contact Guard/Touching assist;Supervision/Verbal cueing Stair Management Technique: Two rails;Step to pattern;With walker Number of Stairs: 4 Height of Stairs: 6 Ramp: Supervision/Verbal cueing Curb: Supervision/Verbal cueing (using RW) Pick up small  object from the floor assist level: Supervision/Verbal cueing Pick up small object from the floor assistive device: RW Wheelchair Mobility Wheelchair Mobility: No  Trunk/Postural Assessment  Cervical Assessment Cervical Assessment: Exceptions to [facility] (slight forward head) Thoracic Assessment Thoracic Assessment: Exceptions to [facility] (slight thoracic kyphosis) Lumbar Assessment Lumbar Assessment: Exceptions to [facility] (flexible posterior pelvic tilt in sitting) Postural Control Postural Control: Within Functional Limits (using B UE support on RW)  Balance Balance Balance Assessed: Yes Standardized Balance Assessment Standardized Balance Assessment: Berg Balance Test (unable to complete test on the last day) Berg Balance Test Sit to Stand: Able to stand  independently using hands (uses hands baseline due to bilateral knee pain) Standing Unsupported: Able to stand safely 2 minutes Sitting with Back Unsupported but Feet Supported on Floor or Stool: Able to sit safely and securely 2 minutes Stand to Sit: Sits safely with minimal use of hands Transfers: Able to transfer safely, definite need of hands (due to bilateral knee pain) Standing Unsupported with Eyes Closed: Able to stand 10 seconds safely Static Sitting Balance Static Sitting - Balance Support: Feet supported Static Sitting - Level of Assistance: 7: Independent Dynamic Sitting Balance Dynamic Sitting - Balance Support: Feet supported Dynamic Sitting - Level of Assistance: 6: Modified independent (Device/Increase time) Static Standing Balance Static Standing - Balance Support: During functional activity Static Standing - Level of Assistance: 6: Modified independent (Device/Increase time);5: Stand by assistance Dynamic Standing Balance Dynamic Standing - Balance Support: During functional activity;Bilateral upper extremity supported Dynamic Standing - Level of Assistance: 5: Stand by assistance Extremity Assessment      RLE  Assessment RLE Assessment: Exceptions to [facility] Active Range of Motion (AROM) Comments: WFL/WNL General Strength Comments: grossly assessed during functional mobility RLE Strength Right Hip Flexion: 4+/5 Right Knee Flexion: 4+/5 Right Knee Extension: 4+/5 Right Ankle Dorsiflexion: 4+/5 Right Ankle Plantar Flexion: 4+/5 LLE Assessment LLE Assessment: Exceptions to [facility] Active Range of Motion (AROM) Comments: WFL/WNL General Strength Comments: grossly assessed during functional mobility LLE Strength Left Hip Flexion: 4+/5 Left Knee Flexion: 4+/5 Left Knee Extension: 4+/5 Left Ankle Dorsiflexion: 4+/5 Left Ankle Plantar Flexion: 4+/5   Andrew Blasius M Arliss Frisina , PT, DPT, NCS, CSRS 05/20/2022, 7:52 AM

## 2022-05-21 ENCOUNTER — Encounter: Payer: Self-pay | Admitting: Cardiology

## 2022-05-21 ENCOUNTER — Encounter (HOSPITAL_COMMUNITY): Payer: Self-pay | Admitting: Physical Medicine & Rehabilitation

## 2022-05-21 ENCOUNTER — Inpatient Hospital Stay (HOSPITAL_BASED_OUTPATIENT_CLINIC_OR_DEPARTMENT_OTHER)
Admit: 2022-05-21 | Discharge: 2022-05-21 | Disposition: A | Discharge status: Home / Self Care | Payer: Medicare PPO | Attending: Physician Assistant | Admitting: Physician Assistant

## 2022-05-21 DIAGNOSIS — R001 Bradycardia, unspecified: Secondary | ICD-10-CM

## 2022-05-21 LAB — CBC WITH DIFFERENTIAL/PLATELET
Abs Immature Granulocytes: 0.02 10*3/uL (ref 0.00–0.07)
Basophils Absolute: 0 10*3/uL (ref 0.0–0.1)
Basophils Relative: 1 %
Eosinophils Absolute: 0.1 10*3/uL (ref 0.0–0.5)
Eosinophils Relative: 2 %
HCT: 34.9 % — ABNORMAL LOW (ref 36.0–46.0)
Hemoglobin: 11.6 g/dL — ABNORMAL LOW (ref 12.0–15.0)
Immature Granulocytes: 0 %
Lymphocytes Relative: 21 %
Lymphs Abs: 1.2 10*3/uL (ref 0.7–4.0)
MCH: 29.2 pg (ref 26.0–34.0)
MCHC: 33.2 g/dL (ref 30.0–36.0)
MCV: 87.9 fL (ref 80.0–100.0)
Monocytes Absolute: 0.4 10*3/uL (ref 0.1–1.0)
Monocytes Relative: 7 %
Neutro Abs: 3.8 10*3/uL (ref 1.7–7.7)
Neutrophils Relative %: 69 %
Platelets: 199 10*3/uL (ref 150–400)
RBC: 3.97 MIL/uL (ref 3.87–5.11)
RDW: 13.4 % (ref 11.5–15.5)
WBC: 5.5 10*3/uL (ref 4.0–10.5)
nRBC: 0 % (ref 0.0–0.2)

## 2022-05-21 LAB — COMPREHENSIVE METABOLIC PANEL
ALT: 16 U/L (ref 0–44)
AST: 22 U/L (ref 15–41)
Albumin: 3.1 g/dL — ABNORMAL LOW (ref 3.5–5.0)
Alkaline Phosphatase: 60 U/L (ref 38–126)
Anion gap: 6 (ref 5–15)
BUN: 22 mg/dL (ref 8–23)
CO2: 26 mmol/L (ref 22–32)
Calcium: 8.8 mg/dL — ABNORMAL LOW (ref 8.9–10.3)
Chloride: 104 mmol/L (ref 98–111)
Creatinine, Ser: 1.19 mg/dL — ABNORMAL HIGH (ref 0.44–1.00)
GFR, Estimated: 45 mL/min — ABNORMAL LOW (ref >60)
Glucose, Bld: 123 mg/dL — ABNORMAL HIGH (ref 70–99)
Potassium: 4.1 mmol/L (ref 3.5–5.1)
Sodium: 136 mmol/L (ref 135–145)
Total Bilirubin: 0.2 mg/dL — ABNORMAL LOW (ref 0.3–1.2)
Total Protein: 5.7 g/dL — ABNORMAL LOW (ref 6.5–8.1)

## 2022-05-21 NOTE — Progress Notes (Signed)
Patient had a quite night. Assisted to the bathroom X3 this shift. Unsteady on her feet and appeared confused at times. Endorsed feeling dizzy as witnessed. VS taken on both occasions and within appreciable limits per patient history. Assisted back into bed. Denies pain, headache or SOB. Will continue to monitor and document changes if any. Dr Doroteo Bradford to be notified for further evaluation. Safety maintained at all times.

## 2022-05-21 NOTE — Progress Notes (Signed)
Occupational Therapy Session Note  Patient Details  Name: Rebekah Pope MRN: [id number] Date of Birth: 03-25-1941  Today's Date: 05/20/2022 OT Individual Time: 0900-1000 OT Individual Time Calculation (min): 60 min  OT Individual Time: 1500-1525 OT Individual Time Calculation (min): 60 min    Short Term Goals: Week 1:  OT Short Term Goal 1 (Week 1): Pt will visually scan within R and L visual fields to identify items needed for grooming tasks with min verbal cues OT Short Term Goal 2 (Week 1): Pt will utilize compensatory strategies for diplopia during functional tasks with min A OT Short Term Goal 3 (Week 1): Pt will complete LB dressing CGA  Skilled Therapeutic Interventions/Progress Updates:     AM Session: Pt received resting in bed finishing breakfast in good spirits and motivated to participate in skilled OT session. Pt reporting 0/10 pain and improvement in double vision symptoms.  Pt transitioned from sitting up in bed to EOB mod I. Pt sit>stand using RW and ambulated in her room to gather clothing close supervision. Pt ambulated to bathroom and completed 3/3 toileting tasks with supervision- min cues for hand placement during sit>stand from toilet. Pt ambulated to shower and transitioned to shower chair supervision- min cues for RW management and safety. Pt completed U/LB bathing seated on shower chair and standing to wash bottom while holding onto grab bar with supervision. Pt noted to undershoot when reaching to turn off water- no double vision reported. Pt ambulated to room following shower completing U/LB dressing with supervision and min verbal cueing during sit>stand for safety. Pt ambulated to sink and completed grooming/hygiene tasks standing using RW close supervision to brush teeth, dry hair, and apply lotion. Pt ambulated to EOB using RW supervision.   Pt reviewed and practiced home vision exercises with OT. Pt required mod verbal cues for technique during exercises,  self-correcting mistakes following verbal cues. Pt completed stand step transfer to wc using RW and was let resting in wc with call bell in reach, chair alarm on, and all needs met.    PM Session: Received update on Pt from Rn reporting the Pt is cleared to see and is no longer experiencing dizziness. Husband present in room upon OT arrival, Pt resting in bed. Pt reporting she is feeling better and requested to use restroom at beginning of session. Pt husband receptive to participating in session. Pt husband educated on safety when Pt is ambulating with RW and transfers to the toilet. Pt and husband demonstrating good safety awareness during toileting and ambulation. Pt completed 3/3 toileting tasks with supervision using RW. Pt ambulated to sink and wash hands supervision using RW. While standing, Pt reporting increase in dizziness and was instructed to rest in wc. Vitals taken- BP 146/57(MAP 84). Dizziness did not subside within ~5 minutes, therapy session discontinued for Pt safety. Pt completed stand step transfer back to bed using RW and was left with call bell in reach, bed alarm on, and all immediate needs met. Rn notified of Pt status.   Therapy Documentation Precautions:  Precautions Precautions: Fall, Other (comment) Precaution Comments: mild diplopia with impaired oculomotor function Restrictions Weight Bearing Restrictions: No General:   Vital Signs: Therapy Vitals Temp: 98.2 F (36.8 C) Pulse Rate: (Abnormal) 43 Resp: 16 BP: (Abnormal) 141/57 Patient Position (if appropriate): Lying Oxygen Therapy SpO2: 98 % O2 Device: Room Air Pain: Pain Assessment Pain Scale: 0-10 Pain Score: 0-No pain ADL: ADL Eating: Independent Where Assessed-Eating: Edge of bed Grooming: Supervision/safety Where Assessed-Grooming: Standing  at sink Upper Body Bathing: Supervision/safety Where Assessed-Upper Body Bathing: Shower Lower Body Bathing: Supervision/safety Where Assessed-Lower Body  Bathing: Shower Upper Body Dressing: Supervision/safety Where Assessed-Upper Body Dressing: Edge of bed Lower Body Dressing: Supervision/safety Where Assessed-Lower Body Dressing: Edge of bed Toileting: Supervision/safety Where Assessed-Toileting: Glass blower/designer: Distant supervision Armed forces technical officer Method: Counselling psychologist: Energy manager: Close supervison Clinical cytogeneticist Method: Optometrist: Civil engineer, contracting without back Social research officer, government: Close supervision Social research officer, government Method: Heritage manager: Shower seat without back  Therapy/Group:   Janey Genta 05/21/2022, 8:02 AM

## 2022-05-21 NOTE — Progress Notes (Signed)
PROGRESS NOTE   Subjective/Complaints:  Pt felt nauseated when getting up this am, orthostatics showing no BP drop but transient HR elevation   ROS: Patient denies CP, SOB, N/V/D  Objective:   No results found. No results for input(s): "WBC", "HGB", "HCT", "PLT" in the last 72 hours.  No results for input(s): "NA", "K", "CL", "CO2", "GLUCOSE", "BUN", "CREATININE", "CALCIUM" in the last 72 hours.   Intake/Output Summary (Last 24 hours) at 05/21/2022 0852 Last data filed at 05/21/2022 0801 Gross per 24 hour  Intake 1200 ml  Output --  Net 1200 ml      Pressure Injury 05/12/22 Coccyx Left Deep Tissue Pressure Injury - Purple or maroon localized area of discolored intact skin or blood-filled blister due to damage of underlying soft tissue from pressure and/or shear. (Active)  05/12/22 1600  Location: Coccyx  Location Orientation: Left  Staging: Deep Tissue Pressure Injury - Purple or maroon localized area of discolored intact skin or blood-filled blister due to damage of underlying soft tissue from pressure and/or shear.  Wound Description (Comments):   Present on Admission: Yes     Pressure Injury 05/12/22 Coccyx Right Deep Tissue Pressure Injury - Purple or maroon localized area of discolored intact skin or blood-filled blister due to damage of underlying soft tissue from pressure and/or shear. (Active)  05/12/22 1835  Location: Coccyx  Location Orientation: Right  Staging: Deep Tissue Pressure Injury - Purple or maroon localized area of discolored intact skin or blood-filled blister due to damage of underlying soft tissue from pressure and/or shear.  Wound Description (Comments):   Present on Admission: Yes    Physical Exam: Vital Signs Blood pressure (!) 143/61, pulse (!) 46, temperature 98.2 F (36.8 C), resp. rate 17, height 5\' 3"  (1.6 m), weight 84.7 kg, SpO2 99 %.   General: No acute distress Mood and  affect are appropriate Heart: Regular rate and rhythm no rubs murmurs or extra sounds Lungs: Clear to auscultation, breathing unlabored, no rales or wheezes Abdomen: Positive bowel sounds, soft nontender to palpation, nondistended Extremities: No clubbing, cyanosis, or edema  Skin: No evidence of breakdown, no evidence of rash Neurologic: No ptosis , upward and downward gaze ok, with forward gaze pt c/o vertical diplopia, Left pupil elevated vs right side , left pupil does not adduct past midline. Glasses taped. motor strength is 5/5 in bilateral deltoid, bicep, tricep, grip, hip flexor, knee extensors, ankle dorsiflexor and plantar flexor Cerebellar, no dysmetria FNFi Neuro exam unchanged   Musculoskeletal: Full range of motion in all 4 extremities. No joint swelling    Assessment/Plan: 1. Functional deficits which require 3+ hours per day of interdisciplinary therapy in a comprehensive inpatient rehab setting. Physiatrist is providing close team supervision and 24 hour management of active medical problems listed below. Physiatrist and rehab team continue to assess barriers to discharge/monitor patient progress toward functional and medical goals  Care Tool:  Bathing    Body parts bathed by patient: Right arm, Left arm, Chest, Abdomen, Front perineal area, Right lower leg, Left upper leg, Right upper leg, Buttocks, Left lower leg, Face         Bathing assist Assist Level: Supervision/Verbal cueing  Upper Body Dressing/Undressing Upper body dressing   What is the patient wearing?: Pull over shirt    Upper body assist Assist Level: Supervision/Verbal cueing    Lower Body Dressing/Undressing Lower body dressing      What is the patient wearing?: Underwear/pull up, Pants     Lower body assist Assist for lower body dressing: Supervision/Verbal cueing     Toileting Toileting    Toileting assist Assist for toileting: Supervision/Verbal cueing      Transfers Chair/bed transfer  Transfers assist     Chair/bed transfer assist level: Supervision/Verbal cueing Chair/bed transfer assistive device: Armrests, Programmer, multimedia   Ambulation assist      Assist level: Supervision/Verbal cueing Assistive device: Walker-rolling Max distance: 151ft   Walk 10 feet activity   Assist     Assist level: Supervision/Verbal cueing Assistive device: Walker-rolling   Walk 50 feet activity   Assist    Assist level: Supervision/Verbal cueing Assistive device: Walker-rolling    Walk 150 feet activity   Assist    Assist level: Supervision/Verbal cueing Assistive device: Walker-rolling    Walk 10 feet on uneven surface  activity   Assist     Assist level: Supervision/Verbal cueing Assistive device: Chemical engineer     Assist Is the patient using a wheelchair?: No Type of Wheelchair: Manual    Wheelchair assist level: Dependent - Patient 0%      Wheelchair 50 feet with 2 turns activity    Assist        Assist Level: Dependent - Patient 0%   Wheelchair 150 feet activity     Assist      Assist Level: Dependent - Patient 0%   Blood pressure (!) 143/61, pulse (!) 46, temperature 98.2 F (36.8 C), resp. rate 17, height 5\' 3"  (1.6 m), weight 84.7 kg, SpO2 99 %.  Medical Problem List and Plan: 1. 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   Antithrombotics: -DVT/anticoagulation:  Pharmaceutical: Xarelto changed to Eliquis              -antiplatelet therapy: N/A 3. Pain Management: Oxycodone as needed 4. Mood/Behavior/Sleep: Trazodone 50 mg nightly as needed             -antipsychotic agents: N/A 5. Neuropsych/cognition: This patient is capable of making decisions on her own behalf. 6. Skin/Wound Care: Routine skin checks 7. Fluids/Electrolytes/Nutrition: Routine in and outs with follow-up chemistries 8.  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  PAF/pacemaker.  Continue Eliquis  as well as amiodarone 100 mg daily. Appreciate Dr. Quentin Ore consult , no need for PPM   F/u Dr Caryl Comes 12/29 and Dr Bettina Gavia 1/4      Vitals:   05/21/22 0839 05/21/22 0842  BP: (!) 144/56 (!) 143/61  Pulse: (!) 42 (!) 46  Resp: 17   Temp:  SpO2: 96% 99%  Discussed permissive HTN is day 5 post CVA will start adjusting meds to reduce BP to normotensive by discharge  12/11 bp well controlled 10.  Hypothyroidism.  Synthroid 11.  CKD.  Creatinine baseline 1.20-1.30.  Follow-up chemistries    Latest Ref Rng & Units 05/13/2022    6:06 AM 05/12/2022    6:21 AM 05/10/2022    6:12 AM  BMP  Glucose 70 - 99 mg/dL 102  104  120   BUN 8 - 23 mg/dL 30  29  31    Creatinine 0.44 - 1.00 mg/dL 1.34  1.27  1.00   Sodium 135 - 145 mmol/L 136  138  139   Potassium 3.5 - 5.1 mmol/L 4.1  4.1  4.2   Chloride 98 - 111 mmol/L 107  106  106   CO2 22 - 32 mmol/L 23  24    Calcium 8.9 - 10.3 mg/dL 8.7  9.0     12.  Diastolic congestive heart failure.  Monitor for any signs of fluid overload      Filed Weights   05/13/22 0449 05/18/22 0552  Weight: 85.5 kg 84.7 kg   13.  Hyperlipidemia.  Lipitor    LOS: 9 days A FACE TO FACE EVALUATION WAS PERFORMED  Charlett Blake 05/21/2022, 8:52 AM

## 2022-05-21 NOTE — Consult Note (Addendum)
Cardiology Consultation   Patient ID: Rebekah Pope MRN: [id number]; DOB: 1940/12/02  Admit date: 05/12/2022 Date of Consult: 05/21/2022  PCP:  Rhea Bleacher, NP   [place] [facility] Cardiologist:  Shirlee More, MD        Patient Profile:   Rebekah Pope is a 81 y.o. 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 Letta Pate.  History of Present Illness:   [name] has been followed by Dr. Geraldo Pitter then switched to Dr. Bettina Gavia, as well as the structural team recently. She was found to have severe AS earlier this year. [facility] 10/31/21 showed diffuse CAD (ranging mild to severe), but recommended for medical therapy.  She underwent TAVR on 02/10/22. She has not done well post-TAVR. Her post-op course has been complicated by significant exercise intolerance/fatigue and near-syncope at times but primarily in the setting of position changes. Monitor 03/2022 showed average HR 45bpm, range 33->129, 11 runs of SVT (longest 12 beats), rare ectopy. She saw Dr. Bettina Gavia 04/28/22 who felt this may represent chronotropic incompetence and recommended EP evaluation to consider pacemaker. Her amiodarone was continued to avoid recurrent episodes of afib. Before outpatient EP could be completed, she was admitted to the hospital 05/12/22 with stroke suggestive of embolic source. Notes indicate this may be due to recent TAVR. Our team saw her for mildly elevated troponin not felt to be clinically significant. Xarelto (previously appropriately renally dosed) was changed to Eliquis 46m BID. Echo showed EF 65-70%, G2DD, elevated LAP, normal RV, moderate BAE, thickened MV with mild MR unchanged from prior with moderate-severe MAC, s/p TAVR with  mean gradient 841mg stable from 02/2022, no AI. EP was also consulted given her continued sinus bradycardia and LBBB. Dr. LaQuentin Oreelt there was no indication for permanent pacemaker and recommended OP EP f/u with Dr. KlCaryl Comes  She required discharge to CIR and was nearing discharge but today she had nausea upon standing. She says the episode was very short, maybe some mild dizziness with it. She sat back down and felt fine.  Orthostatics charted starting 8:36am: Lying HR 41bpm BP 155/58 Standing 42bpm BP 144/56 Standing#2 46bpm BP 134/61  However, in reviewing with nurse tech, she reports that the HR was initially 29 then rose to 149 on Dynamap machine, but was jumping around all over the place. Telemetry is not available on this unit. When EKG was obtained at 8:55am, it showed sinus bradycardia 44bpm, LAD, LBBB, similar to 02/2022. Hgb was 11.6 today, was 11.8 on 12/6, Cr 1.19, albumin 3.1, largely stable from prior. She does mention that last night when standing and walking she felt she was veering to the right more with some visual changes/focusing issues that have persisted this AM. She can see clearly if she looks straight, just feels like she can't focus easily if she moves her eyes. I spoke with CIR APP about this and they are not concerned at this time for progressive neurologic issue given her overall course and progress. They are still hopeful for discharge today as is the patient and her family.  Of note, she is on Entresto. Looking back she was on valsartan at time of discharge from TAVR 02/2022. [facility] [place] Mu[place]ettina Gaviandicated some medication confusion and  said he thought she was on Entresto rather than valsartan. Subsequent phone note from RN 05/04/22 indicates meds were reconciled and Rebekah Pope was now on the medicine list. This was subsequently refilled by our office and continued through the recent hospital stay.  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 Burt Knack & Dr. Cyndia Bent   Severe aortic stenosis     Past Surgical History:  Procedure Laterality Date   CESAREAN SECTION     INTRAOPERATIVE TRANSTHORACIC ECHOCARDIOGRAM N/A 02/10/2022   Procedure: INTRAOPERATIVE TRANSTHORACIC ECHOCARDIOGRAM;  Surgeon: Sherren Mocha, MD;  Location: [place] [facility];  Service: Open Heart Surgery;  Laterality: N/A;   RIGHT HEART CATH AND CORONARY ANGIOGRAPHY N/A 10/31/2021   Procedure: RIGHT HEART CATH AND CORONARY ANGIOGRAPHY;  Surgeon: Burnell Blanks, MD;  Location: [place] [facility];  Service: Cardiovascular;  Laterality: N/A;   TRANSCATHETER AORTIC VALVE REPLACEMENT, TRANSFEMORAL N/A 02/10/2022   Procedure: Transcatheter Aortic Valve Replacement, Transfemoral;  Surgeon: Sherren Mocha, MD;  Location: [place] [facility];  Service: Open Heart Surgery;  Laterality: N/A;     Home Medications:  Prior to Admission medications   Medication Sig Start Date End Date Taking? Authorizing Provider  acetaminophen (TYLENOL) 325 MG tablet Take 2 tablets (650 mg total) by mouth every 6 (six) hours as needed for mild pain, fever or headache. 05/19/22   Angiulli, Lavon Paganini, PA-C  amiodarone (PACERONE) 200 MG tablet Take 0.5 tablets (100 mg total) by mouth daily. 05/19/22   Angiulli, Lavon Paganini, PA-C  amLODipine (NORVASC) 10 MG tablet Take 1 tablet (10 mg total) by mouth at bedtime. 05/19/22   Angiulli, Lavon Paganini, PA-C  apixaban (ELIQUIS) 5 MG TABS tablet Take 1 tablet (5 mg total) by mouth 2 (two) times daily. 05/19/22   Angiulli, Lavon Paganini, PA-C  atorvastatin  (LIPITOR) 80 MG tablet Take 1 tablet (80 mg total) by mouth daily. 05/19/22   Angiulli, Lavon Paganini, PA-C  ENTRESTO 24-26 MG Take 1 tablet by mouth 2 (two) times daily. 05/19/22   Angiulli, Lavon Paganini, PA-C  hydrALAZINE (APRESOLINE) 25 MG tablet Take 1 tablet (25 mg total) by mouth 3 (three) times daily. 05/19/22   Angiulli, Lavon Paganini, PA-C  levothyroxine (SYNTHROID) 112 MCG tablet Take 1 tablet (112 mcg total) by mouth daily before breakfast. 05/19/22   Angiulli, Lavon Paganini, PA-C  oxyCODONE (OXY IR/ROXICODONE) 5 MG immediate release tablet Take 1 tablet (5 mg total) by mouth every 4 (four) hours as needed for moderate pain or severe pain. 05/19/22   Angiulli, Lavon Paganini, PA-C    Inpatient Medications: Scheduled Meds:  amiodarone  100 mg Oral Daily   amLODipine  10 mg Oral Daily   apixaban  5 mg Oral BID   atorvastatin  80 mg Oral Daily   hydrALAZINE  25 mg Oral Q8H   levothyroxine  112 mcg Oral QAC breakfast   sacubitril-valsartan  1 tablet Oral BID   Continuous Infusions:  PRN Meds: acetaminophen, guaiFENesin, ipratropium-albuterol, oxyCODONE, senna-docusate, traZODone  Allergies:   No Known Allergies  Social History:   Social History   Socioeconomic History   Marital status: Married    Spouse name: Richard   Number of children: 4   Years of education: Not on file   Highest education level: Not on file  Occupational History   Occupation: Midwife  Tobacco Use   Smoking status: Never    Passive exposure: Past   Smokeless tobacco: Never  Vaping Use   Vaping Use: Never used  Substance and Sexual Activity   Alcohol use: Never   Drug use: Never   Sexual activity: Not on file  Other Topics Concern   Not on file  Social History Narrative   Not on file   Social Determinants of Health   Financial Resource Strain: Not on file  Food Insecurity: No Food Insecurity (05/11/2022)   Hunger Vital Sign    Worried About Running Out of Food in the Last Year: Never true     Ran Out of Food in the Last Year: Never true  Transportation Needs: No Transportation Needs (05/11/2022)   PRAPARE - Hydrologist (Medical): No    Lack of Transportation (Non-Medical): No  Physical Activity: Not on file  Stress: Not on file  Social Connections: Not on file  Intimate Partner Violence: Not At Risk (05/11/2022)   Humiliation, Afraid, Rape, and Kick questionnaire    Fear of Current or Ex-Partner: No    Emotionally Abused: No    Physically Abused: No    Sexually Abused: No    Family History:   Family History  Problem Relation Age of Onset   Dementia Mother    Pneumonia Mother    Hip fracture Mother    Heart disease Father      ROS:  Please see the history of present illness.  All other ROS reviewed and negative.     Physical Exam/Data:   Vitals:   05/21/22 0412 05/21/22 0836 05/21/22 0839 05/21/22 0842  BP: (!) 141/57 (!) 155/58 (!) 144/56 (!) 143/61  Pulse: (!) 43 (!) 41 (!) 42 (!) 46  Resp: _0 Temp: 98.2 F (36.8 C)     TempSrc:      SpO2: 98% 98% 96% 99%  Weight:      Height:        Intake/Output Summary (Last 24 hours) at 05/21/2022 1134 Last data filed at 05/21/2022 0801 Gross per 24 hour  Intake 840 ml  Output --  Net 840 ml      05/18/2022    5:52 AM 05/13/2022    4:49 AM 05/11/2022    4:29 PM  Last 3 Weights  Weight (lbs) 186 lb 11.2 oz 188 lb 7.9 oz 186 lb 4.6 oz  Weight (kg) 84.687 kg 85.5 kg 84.5 kg     Body mass index is 33.07 kg/m.  General: Well developed, well nourished, in no acute distress. Head: Normocephalic, atraumatic, sclera non-icteric, no xanthomas, nares are without discharge. Neck: Negative for carotid bruits. JVP not elevated. Lungs: Clear bilaterally to auscultation without wheezes, rales, or rhonchi. Breathing is unlabored. Heart: RRR S1 S2 without murmurs, rubs, or gallops.  Abdomen: Soft, non-tender, non-distended with normoactive bowel sounds. No  rebound/guarding. Extremities: No clubbing or cyanosis. No edema. Distal pedal pulses are 2+ and equal bilaterally. Neuro: Alert and oriented X 3. Moves all extremities spontaneously. Psych:  Responds to questions appropriately with a normal affect.    EKG:  The EKG was personally reviewed and demonstrates:  sinus bradycardia 44bpm, LAD, LBBB (similar to 02/2022) Telemetry:  Telemetry is not available on this unit  Relevant CV Studies: 2d echo 05/11/22   1. Left ventricular ejection fraction, by estimation, is 65 to 70%. The  left ventricle has normal function. The left ventricle has no regional  wall motion abnormalities. There is mild left ventricular hypertrophy.  Left ventricular diastolic parameters  are consistent with Grade II diastolic dysfunction (pseudonormalization).  Elevated left atrial pressure.   2. Right ventricular systolic function is normal. The right ventricular  size is normal. There is normal pulmonary artery systolic pressure.   3. Left atrial size was moderately dilated.   4. Right atrial size was moderately dilated.   5. MV is thickened with mildy resricted motoin. Mean gradient through the  valve is 3.5 cm2. (HR 49 bpm) Unchanged from echo on 02/10/22.. Mild mitral  valve regurgitation. Moderate to severe mitral annular calcification.   6. S/p TAVR (26 mm Medtronic CoreValve-Evolut Pro prothesis, procedure  date 02/10/22) Mean gradient through the valve is 8 mm Hg. No significant  change from echo done 02/10/22.. The aortic valve has been  repaired/replaced. Aortic valve regurgitation is not  visualized.   7. The inferior vena cava is normal in size with greater than 50%  respiratory variability, suggesting right atrial pressure of 3 mmHg.   Laboratory Data:  High Sensitivity Troponin:   Recent Labs  Lab 05/10/22 0043 05/10/22 0236  TROPONINIHS 105* 114*     ChemistryNo results for input(s): "NA", "K", "CL", "CO2", "GLUCOSE", "BUN", "CREATININE", "CALCIUM",  "MG", "GFRNONAA", "GFRAA", "ANIONGAP" in the last 168 hours.  No results for input(s): "PROT", "ALBUMIN", "AST", "ALT", "ALKPHOS", "BILITOT" in the last 168 hours. Lipids No results for input(s): "CHOL", "TRIG", "HDL", "LABVLDL", "LDLCALC", "CHOLHDL" in the last 168 hours.  Hematology Recent Labs  Lab 05/21/22 0924  WBC 5.5  RBC 3.97  HGB 11.6*  HCT 34.9*  MCV 87.9  MCH 29.2  MCHC 33.2  RDW 13.4  PLT 199   Thyroid No results for input(s): "TSH", "FREET4" in the last 168 hours.  BNPNo results for input(s): "BNP", "PROBNP" in the last 168 hours.  DDimer No results for input(s): "DDIMER" in the last 168 hours.   Radiology/Studies:  No results found.   Assessment and Plan:   1. Nausea upon standing - she did have a decline in BP from 155/58 to 134/61 but did not become hypotensive - per EMR review, orthostatic VS beginning at 8:36am charted a HR change from 41->42->46. However in reviewing with tech who did the VS, she reports HR was jumping around from 29 to 147 on a Dynamap at the time. EKG obtained at 8:55 showed HR sinus brady in the 40s c/w prior. Difficult to know exactly what this represents, now clinically stable - per discussion with Dr. Harriet Masson, she recommends hold off on medicine changes at this time and arrange repeat 7 day live monitor to be placed prior to discharge with Dr. Bettina Gavia to read, and to keep follow-up as scheduled below - she feels from cardiac standpoint patient can still be discharged after monitor is placed (I confirmed with inpatient team they will place shortly) - also relayed to CIR APP 1) AVS needed to be reprinted as it will populate info on zio and 2) would avoid driving until symptoms have completely resolved given recent CVA. Can be revisited as OP if appropriate.   2. Chronic sinus bradycardia, LBBB, with  underlying hx of PAF on chronic amiodarone - plan as above - seen recently by EP team for similar findings, recommended to continue low dose  amiodarone and no indication for PPM, with plan for outpatient EP f/u which we will keep - recent TSH, K OK - on Eliquis for [place]   3. Chronic diastolic CHF, HTN - appears euvolemic on present regimen on amlodipine 77m, hydralazine 287mq8hr, Entresto 24/2622mID - was on valsartan at discharge 02/11/22, then on Entresto after it was clarified she was on this as an outpatient, refilled by our [facility] -  per d/w Dr. TobHarriet Massonould continue present regimen at discharge - can be reviewed by primary cardiologist going forward  4. Stroke - per CIR team, now on Eliquis   5. CAD - continue medical management, no ASA [place]. Kle[place]aryl Comes/29 and OV with Dr. MunBettina Gavia4/23 which we would recommend to keep.     Risk Assessment/Risk Scores:          CHA2DS2-VASc Score = 5   This indicates a 7.2% annual risk of stroke. The patient's score is based upon: CHF History: 1 HTN History: 1 Diabetes History: 0 Stroke History: 0 Vascular Disease History: 0 Age Score: 2 Gender Score: 1   For questions or updates, please contact [place] consult [email or web address] for contact info under   Signed, DayCharlie PitterA-C  05/21/2022 11:34 AM  Patient seen and examined, note reviewed with the signed Advanced Practice Provider. I personally reviewed laboratory data, imaging studies and relevant notes. I independently examined the patient and formulated the important aspects of the plan. I have personally discussed the plan with the patient and/or family. Comments or changes to the note/plan are indicated below.  Patient seen and examined at her bedside.  Son and husband present in the room.  She was asymptomatic  at the time of my visit. No medication changes at this time. Will benefit from ZioPronghornve at the time of discharge. Plan for follow up with Dr. MunBettina Gaviaer primary cardiologist as well as she has a visit scheduled with Dr. KleCaryl ComesP.[place] KarBerniece Salines, MS  FACProvidence St. Joseph'S Hospitaltending Cardiologist [place] [phone]: [email or web address]

## 2022-05-22 ENCOUNTER — Telehealth: Payer: Self-pay | Admitting: Internal Medicine

## 2022-05-22 NOTE — Telephone Encounter (Signed)
STAT if HR is under 50 or over 120 (normal HR is 60-100 beats per minute)  What is your heart rate? 44  Do you have a log of your heart rate readings (document readings)? Yes   Do you have any other symptoms? Dizzy spells, and had a fall last night (syncope)

## 2022-05-22 NOTE — Telephone Encounter (Signed)
Rebekah Pope from [facility] health called stating that Home Health has resumed since coming home from [facility] after a stroke and bradycardia. HH reported she continues to have dizzy spells as before and has fallen one time. Her HR was 44 at nurse visit. HH is aware her HR are low and wanted to know if Dr. Dulce Sellar would consider adjusting the HR parameters. They will call each time HR is below 60 at this time.

## 2022-05-26 NOTE — Telephone Encounter (Signed)
Rebekah Pope from [facility] called with report on pts HR of 48 she is asymptomatic. She will call if the HR is less than 60. Rebekah Pope wanted to know if you would like to change the HR parameters.

## 2022-05-26 NOTE — Telephone Encounter (Signed)
Spoke with Lyla Son from [facility] and per Dr. Hulen Shouts note she can adjust parameters to call if HR is less than 40bpm

## 2022-05-26 NOTE — Telephone Encounter (Signed)
Carrie From [facility] called stating that pt's HR is now 4 and would like an update on previous message.

## 2022-06-03 ENCOUNTER — Encounter: Payer: Medicare PPO | Admitting: Registered Nurse

## 2022-06-03 VITALS — BP 155/71 | HR 52 | Ht 63.0 in | Wt 190.0 lb

## 2022-06-03 DIAGNOSIS — I480 Paroxysmal atrial fibrillation: Secondary | ICD-10-CM | POA: Insufficient documentation

## 2022-06-03 DIAGNOSIS — I350 Nonrheumatic aortic (valve) stenosis: Secondary | ICD-10-CM

## 2022-06-03 DIAGNOSIS — I10 Essential (primary) hypertension: Secondary | ICD-10-CM | POA: Diagnosis not present

## 2022-06-03 DIAGNOSIS — I639 Cerebral infarction, unspecified: Secondary | ICD-10-CM | POA: Insufficient documentation

## 2022-06-03 DIAGNOSIS — I63232 Cerebral infarction due to unspecified occlusion or stenosis of left carotid arteries: Secondary | ICD-10-CM | POA: Diagnosis not present

## 2022-06-03 NOTE — Progress Notes (Unsigned)
Subjective:    Patient ID: Rebekah Pope, female    DOB: [birth year], 81 y.o.   MRN: [id number]  HPI: Rebekah Pope is a 81 y.o. female who is here for HFU appointment for follow up of her  Ischemic cerebrovascular accident, Paroxysmal Atrial Fibrillation, Aortic Stenosis/ S?P TAVR on 02/10/2022 and Essential Hypertension. She presented to [facility] on 05/10/2022 with complaints of dizziness and double vision.  Dr Nicanor Alcon H&P on 05/10/2022 Patient with CAD and s/p TAVR presents with elevated BP (200s/100s), lightheadness and nausea and vomiting.  Patient also reports sudden onset binocular diplopia imaged on one on top of the other, symptoms resolve by closing one eye.   Neurology Consulted. Rebekah Pope was transitioned to Eliquis  MR Brain: WO Contrast IMPRESSION: 1. Scattered tiny acute to subacute infarcts suggesting central embolic disease. 2. Brain atrophy and chronic small vessel ischemia with small chronic cerebellar infarcts bilaterally.  Rebekah Pope was admitted to inpatient rehabilitation on 05/12/2022 and discharged home on 05/21/2022. She is receiving Home Health Therapy from [facility]. She denies any pain . She rates her pain 0. Also reports she has a good appetite.   She arrived bradycardic apical pulse checked, cardiology following.   Husband in Room.      Pain Inventory Average Pain 0 Pain Right Now 0 My pain is  no pain  LOCATION OF PAIN  knee  BOWEL Number of stools per week: 3/4 Oral laxative use No  Type of laxative . Enema or suppository use No  History of colostomy No  Incontinent No   BLADDER Normal In and out cath, frequency . Able to self cath  . Bladder incontinence No  Frequent urination No  Leakage with coughing No  Difficulty starting stream No  Incomplete bladder emptying No    Mobility use a walker  Function retired I need assistance with the following:  household duties  Neuro/Psych trouble walking  Prior  Studies TC appt  Physicians involved in your care TC appt   Family History  Problem Relation Age of Onset   Dementia Mother    Pneumonia Mother    Hip fracture Mother    Heart disease Father    Social History   Socioeconomic History   Marital status: Married    Spouse name: Richard   Number of children: 4   Years of education: Not on file   Highest education level: Not on file  Occupational History   Occupation: Animator  Tobacco Use   Smoking status: Never    Passive exposure: Past   Smokeless tobacco: Never  Vaping Use   Vaping Use: Never used  Substance and Sexual Activity   Alcohol use: Never   Drug use: Never   Sexual activity: Not on file  Other Topics Concern   Not on file  Social History Narrative   Not on file   Social Determinants of Health   Financial Resource Strain: Not on file  Food Insecurity: No Food Insecurity (05/11/2022)   Hunger Vital Sign    Worried About Running Out of Food in the Last Year: Never true    Ran Out of Food in the Last Year: Never true  Transportation Needs: No Transportation Needs (05/11/2022)   PRAPARE - Administrator, Civil Service (Medical): No    Lack of Transportation (Non-Medical): No  Physical Activity: Not on file  Stress: Not on file  Social Connections: Not on file   Past Surgical History:  Procedure Laterality  Date   CESAREAN SECTION     INTRAOPERATIVE TRANSTHORACIC ECHOCARDIOGRAM N/A 02/10/2022   Procedure: INTRAOPERATIVE TRANSTHORACIC ECHOCARDIOGRAM;  Surgeon: Tonny Bollman, MD;  Location: [facility] INVASIVE CV LAB;  Service: Open Heart Surgery;  Laterality: N/A;   RIGHT HEART CATH AND CORONARY ANGIOGRAPHY N/A 10/31/2021   Procedure: RIGHT HEART CATH AND CORONARY ANGIOGRAPHY;  Surgeon: Kathleene Hazel, MD;  Location: [facility] INVASIVE CV LAB;  Service: Cardiovascular;  Laterality: N/A;   TRANSCATHETER AORTIC VALVE REPLACEMENT, TRANSFEMORAL N/A 02/10/2022   Procedure: Transcatheter Aortic  Valve Replacement, Transfemoral;  Surgeon: Tonny Bollman, MD;  Location: [facility] INVASIVE CV LAB;  Service: Open Heart Surgery;  Laterality: N/A;   Past Medical History:  Diagnosis Date   Allergic rhinitis    CAD (coronary artery disease)    Chronic diastolic CHF (congestive heart failure) (HCC)    Chronic kidney disease, stage 3b (HCC)    Complex regional pain syndrome i of unspecified lower limb    Dizziness and giddiness    Essential (primary) hypertension    History of hiatal hernia    Hyperlipidemia    Hypothyroidism    LBBB (left bundle branch block)    Localized edema    Paroxysmal atrial fibrillation (HCC)    PONV (postoperative nausea and vomiting)    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 Excell Seltzer & Dr. Laneta Simmers   Severe aortic stenosis    Sinus bradycardia    Stroke (cerebrum) (HCC)    BP (!) 155/71   Pulse (!) 53   Ht 5\' 3"  (1.6 m)   Wt 190 lb (86.2 kg)   SpO2 95%   BMI 33.66 kg/m   Opioid Risk Score:   Fall Risk Score:  `1  Depression screen [facility] 2/9     06/03/2022    2:20 PM  Depression screen PHQ 2/9  Decreased Interest 0  Down, Depressed, Hopeless 0  PHQ - 2 Score 0  Altered sleeping 1  Tired, decreased energy 1  Change in appetite 0  Feeling bad or failure about yourself  0  Trouble concentrating 0  Moving slowly or fidgety/restless 0  Suicidal thoughts 0  PHQ-9 Score 2  Difficult doing work/chores Not difficult at all     Review of Systems  Gastrointestinal:  Positive for nausea.  Musculoskeletal:  Positive for gait problem.  All other systems reviewed and are negative.     Objective:   Physical Exam Vitals and nursing note reviewed.  Constitutional:      Appearance: Normal appearance.  Cardiovascular:     Rate and Rhythm: Regular rhythm. Bradycardia present.     Pulses: Normal pulses.     Heart sounds: Normal heart sounds.  Pulmonary:     Effort:  Pulmonary effort is normal.     Breath sounds: Normal breath sounds.  Musculoskeletal:     Cervical back: Normal range of motion and neck supple.     Comments: Normal Muscle Bulk and Muscle Testing Reveals:  Upper Extremities: Full ROM and Muscle Strength 5/5  Lower Extremities: Full ROM and Muscle Strength 5/5 Arises from Table slowly using walker for support Narrow Based  Gait     Skin:    General: Skin is warm and dry.  Neurological:     Mental Status: She is alert and oriented to person, place, and time.  Psychiatric:        Mood and Affect: Mood normal.  Behavior: Behavior normal.         Assessment & Plan:    Ischemic cerebrovascular accident: [facility] was called and she has a scheduled appointment with Neurology.Dr Terrace Arabia Continue Home Health Therapy with [facility]. Continue current medication regimen.  , Paroxysmal Atrial Fibrillation, Aortic Stenosis/ S?P TAVR on 02/10/2022 : Continue current medication regimen. Cardiology following. Continue to Monitor.   Essential Hypertension.: Continue current medication regimen. Continue to Monitor.   F/U with Dr Wynn Banker in 4- 6 weeks

## 2022-06-04 ENCOUNTER — Emergency Department (HOSPITAL_COMMUNITY): Payer: Medicare PPO

## 2022-06-04 ENCOUNTER — Inpatient Hospital Stay (HOSPITAL_COMMUNITY): Payer: Medicare PPO

## 2022-06-04 ENCOUNTER — Inpatient Hospital Stay (HOSPITAL_COMMUNITY)
Admission: EM | Admit: 2022-06-04 | Discharge: 2022-06-11 | DRG: 023 | Disposition: A | Discharge status: Rehabilitation Facility | Payer: Medicare PPO | Attending: Neurology | Admitting: Neurology

## 2022-06-04 ENCOUNTER — Inpatient Hospital Stay (HOSPITAL_COMMUNITY): Payer: Medicare PPO | Admitting: Anesthesiology

## 2022-06-04 ENCOUNTER — Encounter: Payer: Self-pay | Admitting: Registered Nurse

## 2022-06-04 ENCOUNTER — Encounter (HOSPITAL_COMMUNITY)
Admission: EM | Disposition: A | Discharge status: Rehabilitation Facility | Payer: Self-pay | Source: Home / Self Care | Attending: Neurology

## 2022-06-04 DIAGNOSIS — R4701 Aphasia: Secondary | ICD-10-CM | POA: Diagnosis present

## 2022-06-04 DIAGNOSIS — Z1152 Encounter for screening for COVID-19: Secondary | ICD-10-CM

## 2022-06-04 DIAGNOSIS — Z8673 Personal history of transient ischemic attack (TIA), and cerebral infarction without residual deficits: Secondary | ICD-10-CM

## 2022-06-04 DIAGNOSIS — J9600 Acute respiratory failure, unspecified whether with hypoxia or hypercapnia: Secondary | ICD-10-CM | POA: Diagnosis not present

## 2022-06-04 DIAGNOSIS — I63231 Cerebral infarction due to unspecified occlusion or stenosis of right carotid arteries: Secondary | ICD-10-CM

## 2022-06-04 DIAGNOSIS — I5030 Unspecified diastolic (congestive) heart failure: Secondary | ICD-10-CM | POA: Diagnosis present

## 2022-06-04 DIAGNOSIS — I447 Left bundle-branch block, unspecified: Secondary | ICD-10-CM | POA: Diagnosis present

## 2022-06-04 DIAGNOSIS — I639 Cerebral infarction, unspecified: Secondary | ICD-10-CM | POA: Diagnosis not present

## 2022-06-04 DIAGNOSIS — Z79899 Other long term (current) drug therapy: Secondary | ICD-10-CM

## 2022-06-04 DIAGNOSIS — I63412 Cerebral infarction due to embolism of left middle cerebral artery: Secondary | ICD-10-CM | POA: Diagnosis present

## 2022-06-04 DIAGNOSIS — E039 Hypothyroidism, unspecified: Secondary | ICD-10-CM | POA: Diagnosis present

## 2022-06-04 DIAGNOSIS — G8191 Hemiplegia, unspecified affecting right dominant side: Secondary | ICD-10-CM | POA: Diagnosis present

## 2022-06-04 DIAGNOSIS — D631 Anemia in chronic kidney disease: Secondary | ICD-10-CM | POA: Diagnosis present

## 2022-06-04 DIAGNOSIS — N183 Chronic kidney disease, stage 3 unspecified: Secondary | ICD-10-CM | POA: Diagnosis present

## 2022-06-04 DIAGNOSIS — I63512 Cerebral infarction due to unspecified occlusion or stenosis of left middle cerebral artery: Secondary | ICD-10-CM | POA: Diagnosis not present

## 2022-06-04 DIAGNOSIS — Z7189 Other specified counseling: Secondary | ICD-10-CM | POA: Diagnosis not present

## 2022-06-04 DIAGNOSIS — R29810 Facial weakness: Secondary | ICD-10-CM | POA: Diagnosis present

## 2022-06-04 DIAGNOSIS — I110 Hypertensive heart disease with heart failure: Secondary | ICD-10-CM | POA: Diagnosis not present

## 2022-06-04 DIAGNOSIS — I6349 Cerebral infarction due to embolism of other cerebral artery: Secondary | ICD-10-CM | POA: Diagnosis not present

## 2022-06-04 DIAGNOSIS — Z820 Family history of epilepsy and other diseases of the nervous system: Secondary | ICD-10-CM

## 2022-06-04 DIAGNOSIS — J9601 Acute respiratory failure with hypoxia: Secondary | ICD-10-CM | POA: Diagnosis not present

## 2022-06-04 DIAGNOSIS — G9340 Encephalopathy, unspecified: Secondary | ICD-10-CM | POA: Diagnosis not present

## 2022-06-04 DIAGNOSIS — I10 Essential (primary) hypertension: Secondary | ICD-10-CM | POA: Diagnosis not present

## 2022-06-04 DIAGNOSIS — N1832 Chronic kidney disease, stage 3b: Secondary | ICD-10-CM | POA: Diagnosis present

## 2022-06-04 DIAGNOSIS — E785 Hyperlipidemia, unspecified: Secondary | ICD-10-CM | POA: Diagnosis present

## 2022-06-04 DIAGNOSIS — Z6834 Body mass index (BMI) 34.0-34.9, adult: Secondary | ICD-10-CM

## 2022-06-04 DIAGNOSIS — G460 Middle cerebral artery syndrome: Secondary | ICD-10-CM | POA: Diagnosis present

## 2022-06-04 DIAGNOSIS — I6381 Other cerebral infarction due to occlusion or stenosis of small artery: Secondary | ICD-10-CM | POA: Diagnosis not present

## 2022-06-04 DIAGNOSIS — I509 Heart failure, unspecified: Secondary | ICD-10-CM | POA: Diagnosis not present

## 2022-06-04 DIAGNOSIS — D509 Iron deficiency anemia, unspecified: Secondary | ICD-10-CM | POA: Diagnosis present

## 2022-06-04 DIAGNOSIS — R638 Other symptoms and signs concerning food and fluid intake: Secondary | ICD-10-CM | POA: Diagnosis not present

## 2022-06-04 DIAGNOSIS — H53461 Homonymous bilateral field defects, right side: Secondary | ICD-10-CM | POA: Diagnosis present

## 2022-06-04 DIAGNOSIS — R34 Anuria and oliguria: Secondary | ICD-10-CM | POA: Diagnosis present

## 2022-06-04 DIAGNOSIS — R401 Stupor: Secondary | ICD-10-CM | POA: Diagnosis not present

## 2022-06-04 DIAGNOSIS — Z953 Presence of xenogenic heart valve: Secondary | ICD-10-CM

## 2022-06-04 DIAGNOSIS — Z66 Do not resuscitate: Secondary | ICD-10-CM | POA: Diagnosis not present

## 2022-06-04 DIAGNOSIS — K5903 Drug induced constipation: Secondary | ICD-10-CM | POA: Diagnosis not present

## 2022-06-04 DIAGNOSIS — Z79890 Hormone replacement therapy (postmenopausal): Secondary | ICD-10-CM

## 2022-06-04 DIAGNOSIS — I2510 Atherosclerotic heart disease of native coronary artery without angina pectoris: Secondary | ICD-10-CM | POA: Diagnosis not present

## 2022-06-04 DIAGNOSIS — R400 Somnolence: Secondary | ICD-10-CM | POA: Diagnosis not present

## 2022-06-04 DIAGNOSIS — B974 Respiratory syncytial virus as the cause of diseases classified elsewhere: Secondary | ICD-10-CM | POA: Diagnosis not present

## 2022-06-04 DIAGNOSIS — D649 Anemia, unspecified: Secondary | ICD-10-CM | POA: Diagnosis not present

## 2022-06-04 DIAGNOSIS — Z8249 Family history of ischemic heart disease and other diseases of the circulatory system: Secondary | ICD-10-CM

## 2022-06-04 DIAGNOSIS — Z7901 Long term (current) use of anticoagulants: Secondary | ICD-10-CM

## 2022-06-04 DIAGNOSIS — K5901 Slow transit constipation: Secondary | ICD-10-CM | POA: Diagnosis not present

## 2022-06-04 DIAGNOSIS — I471 Supraventricular tachycardia, unspecified: Secondary | ICD-10-CM | POA: Diagnosis present

## 2022-06-04 DIAGNOSIS — R001 Bradycardia, unspecified: Secondary | ICD-10-CM | POA: Insufficient documentation

## 2022-06-04 DIAGNOSIS — R569 Unspecified convulsions: Secondary | ICD-10-CM | POA: Diagnosis not present

## 2022-06-04 DIAGNOSIS — I480 Paroxysmal atrial fibrillation: Secondary | ICD-10-CM | POA: Diagnosis present

## 2022-06-04 DIAGNOSIS — N189 Chronic kidney disease, unspecified: Secondary | ICD-10-CM | POA: Diagnosis not present

## 2022-06-04 DIAGNOSIS — I6389 Other cerebral infarction: Secondary | ICD-10-CM | POA: Diagnosis not present

## 2022-06-04 DIAGNOSIS — I130 Hypertensive heart and chronic kidney disease with heart failure and stage 1 through stage 4 chronic kidney disease, or unspecified chronic kidney disease: Secondary | ICD-10-CM | POA: Diagnosis present

## 2022-06-04 DIAGNOSIS — I25810 Atherosclerosis of coronary artery bypass graft(s) without angina pectoris: Secondary | ICD-10-CM | POA: Diagnosis present

## 2022-06-04 DIAGNOSIS — I495 Sick sinus syndrome: Secondary | ICD-10-CM | POA: Diagnosis present

## 2022-06-04 DIAGNOSIS — K5900 Constipation, unspecified: Secondary | ICD-10-CM | POA: Diagnosis present

## 2022-06-04 DIAGNOSIS — I5032 Chronic diastolic (congestive) heart failure: Secondary | ICD-10-CM | POA: Diagnosis present

## 2022-06-04 DIAGNOSIS — D6832 Hemorrhagic disorder due to extrinsic circulating anticoagulants: Secondary | ICD-10-CM | POA: Diagnosis present

## 2022-06-04 DIAGNOSIS — E669 Obesity, unspecified: Secondary | ICD-10-CM | POA: Diagnosis present

## 2022-06-04 DIAGNOSIS — M1711 Unilateral primary osteoarthritis, right knee: Secondary | ICD-10-CM | POA: Diagnosis not present

## 2022-06-04 DIAGNOSIS — I69351 Hemiplegia and hemiparesis following cerebral infarction affecting right dominant side: Secondary | ICD-10-CM | POA: Diagnosis not present

## 2022-06-04 DIAGNOSIS — R4182 Altered mental status, unspecified: Secondary | ICD-10-CM | POA: Diagnosis not present

## 2022-06-04 DIAGNOSIS — I63232 Cerebral infarction due to unspecified occlusion or stenosis of left carotid arteries: Principal | ICD-10-CM | POA: Diagnosis present

## 2022-06-04 DIAGNOSIS — I69320 Aphasia following cerebral infarction: Secondary | ICD-10-CM | POA: Diagnosis not present

## 2022-06-04 DIAGNOSIS — I63441 Cerebral infarction due to embolism of right cerebellar artery: Secondary | ICD-10-CM | POA: Diagnosis not present

## 2022-06-04 DIAGNOSIS — R29720 NIHSS score 20: Secondary | ICD-10-CM | POA: Diagnosis present

## 2022-06-04 HISTORY — PX: IR US GUIDE VASC ACCESS RIGHT: IMG2390

## 2022-06-04 HISTORY — PX: IR PERCUTANEOUS ART THROMBECTOMY/INFUSION INTRACRANIAL INC DIAG ANGIO: IMG6087

## 2022-06-04 HISTORY — PX: RADIOLOGY WITH ANESTHESIA: SHX6223

## 2022-06-04 HISTORY — PX: IR ANGIO VERTEBRAL SEL VERTEBRAL UNI L MOD SED: IMG5367

## 2022-06-04 HISTORY — PX: IR CT HEAD LTD: IMG2386

## 2022-06-04 LAB — CBC
HCT: 34.1 % — ABNORMAL LOW (ref 36.0–46.0)
Hemoglobin: 11.6 g/dL — ABNORMAL LOW (ref 12.0–15.0)
MCH: 30.1 pg (ref 26.0–34.0)
MCHC: 34 g/dL (ref 30.0–36.0)
MCV: 88.6 fL (ref 80.0–100.0)
Platelets: 166 10*3/uL (ref 150–400)
RBC: 3.85 MIL/uL — ABNORMAL LOW (ref 3.87–5.11)
RDW: 13.4 % (ref 11.5–15.5)
WBC: 8.1 10*3/uL (ref 4.0–10.5)
nRBC: 0 % (ref 0.0–0.2)

## 2022-06-04 LAB — ETHANOL: Alcohol, Ethyl (B): <10 mg/dL (ref <10)

## 2022-06-04 LAB — POCT I-STAT 7, (LYTES, BLD GAS, ICA,H+H)
Acid-base deficit: 3 mmol/L — ABNORMAL HIGH (ref 0.0–2.0)
Bicarbonate: 21.1 mmol/L (ref 20.0–28.0)
Calcium, Ion: 1.16 mmol/L (ref 1.15–1.40)
HCT: 30 % — ABNORMAL LOW (ref 36.0–46.0)
Hemoglobin: 10.2 g/dL — ABNORMAL LOW (ref 12.0–15.0)
O2 Saturation: 100 %
Patient temperature: 97.5
Potassium: 3.4 mmol/L — ABNORMAL LOW (ref 3.5–5.1)
Sodium: 140 mmol/L (ref 135–145)
TCO2: 22 mmol/L (ref 22–32)
pCO2 arterial: 33.6 mmHg (ref 32–48)
pH, Arterial: 7.402 (ref 7.35–7.45)
pO2, Arterial: 499 mmHg — ABNORMAL HIGH (ref 83–108)

## 2022-06-04 LAB — RESP PANEL BY RT-PCR (RSV, FLU A&B, COVID)  RVPGX2
Influenza A by PCR: NEGATIVE
Influenza B by PCR: NEGATIVE
Resp Syncytial Virus by PCR: NEGATIVE
SARS Coronavirus 2 by RT PCR: NEGATIVE

## 2022-06-04 LAB — COMPREHENSIVE METABOLIC PANEL
ALT: 19 U/L (ref 0–44)
AST: 23 U/L (ref 15–41)
Albumin: 3.2 g/dL — ABNORMAL LOW (ref 3.5–5.0)
Alkaline Phosphatase: 76 U/L (ref 38–126)
Anion gap: 8 (ref 5–15)
BUN: 20 mg/dL (ref 8–23)
CO2: 24 mmol/L (ref 22–32)
Calcium: 8.6 mg/dL — ABNORMAL LOW (ref 8.9–10.3)
Chloride: 109 mmol/L (ref 98–111)
Creatinine, Ser: 0.91 mg/dL (ref 0.44–1.00)
GFR, Estimated: >60 mL/min (ref >60)
Glucose, Bld: 108 mg/dL — ABNORMAL HIGH (ref 70–99)
Potassium: 3.7 mmol/L (ref 3.5–5.1)
Sodium: 141 mmol/L (ref 135–145)
Total Bilirubin: 0.8 mg/dL (ref 0.3–1.2)
Total Protein: 5.8 g/dL — ABNORMAL LOW (ref 6.5–8.1)

## 2022-06-04 LAB — URINALYSIS, ROUTINE W REFLEX MICROSCOPIC
Bacteria, UA: NONE SEEN
Bilirubin Urine: NEGATIVE
Glucose, UA: NEGATIVE mg/dL
Ketones, ur: NEGATIVE mg/dL
Nitrite: NEGATIVE
Protein, ur: NEGATIVE mg/dL
Specific Gravity, Urine: 1.03 (ref 1.005–1.030)
pH: 6 (ref 5.0–8.0)

## 2022-06-04 LAB — DIFFERENTIAL
Abs Immature Granulocytes: 0.03 10*3/uL (ref 0.00–0.07)
Basophils Absolute: 0.1 10*3/uL (ref 0.0–0.1)
Basophils Relative: 1 %
Eosinophils Absolute: 0.5 10*3/uL (ref 0.0–0.5)
Eosinophils Relative: 7 %
Immature Granulocytes: 0 %
Lymphocytes Relative: 36 %
Lymphs Abs: 2.9 10*3/uL (ref 0.7–4.0)
Monocytes Absolute: 0.5 10*3/uL (ref 0.1–1.0)
Monocytes Relative: 7 %
Neutro Abs: 4 10*3/uL (ref 1.7–7.7)
Neutrophils Relative %: 49 %

## 2022-06-04 LAB — APTT: aPTT: 29 seconds (ref 24–36)

## 2022-06-04 LAB — ECHOCARDIOGRAM COMPLETE
AR max vel: 1.75 cm2
AV Area VTI: 1.81 cm2
AV Area mean vel: 2.01 cm2
AV Mean grad: 7 mmHg
AV Peak grad: 15.8 mmHg
Ao pk vel: 1.99 m/s
Area-P 1/2: 2.69 cm2
S' Lateral: 2.5 cm
Weight: 3097.02 oz

## 2022-06-04 LAB — I-STAT CHEM 8, ED
BUN: 22 mg/dL (ref 8–23)
Calcium, Ion: 1.17 mmol/L (ref 1.15–1.40)
Chloride: 107 mmol/L (ref 98–111)
Creatinine, Ser: 0.9 mg/dL (ref 0.44–1.00)
Glucose, Bld: 107 mg/dL — ABNORMAL HIGH (ref 70–99)
HCT: 33 % — ABNORMAL LOW (ref 36.0–46.0)
Hemoglobin: 11.2 g/dL — ABNORMAL LOW (ref 12.0–15.0)
Potassium: 3.7 mmol/L (ref 3.5–5.1)
Sodium: 141 mmol/L (ref 135–145)
TCO2: 23 mmol/L (ref 22–32)

## 2022-06-04 LAB — MRSA NEXT GEN BY PCR, NASAL: MRSA by PCR Next Gen: NOT DETECTED

## 2022-06-04 LAB — RAPID URINE DRUG SCREEN, HOSP PERFORMED
Amphetamines: NOT DETECTED
Barbiturates: NOT DETECTED
Benzodiazepines: NOT DETECTED
Cocaine: NOT DETECTED
Opiates: NOT DETECTED
Tetrahydrocannabinol: NOT DETECTED

## 2022-06-04 LAB — PROTIME-INR
INR: 1.3 — ABNORMAL HIGH (ref 0.8–1.2)
Prothrombin Time: 16.4 seconds — ABNORMAL HIGH (ref 11.4–15.2)

## 2022-06-04 LAB — GLUCOSE, CAPILLARY
Glucose-Capillary: 130 mg/dL — ABNORMAL HIGH (ref 70–99)
Glucose-Capillary: 124 mg/dL — ABNORMAL HIGH (ref 70–99)
Glucose-Capillary: 118 mg/dL — ABNORMAL HIGH (ref 70–99)
Glucose-Capillary: 99 mg/dL (ref 70–99)

## 2022-06-04 LAB — CBG MONITORING, ED: Glucose-Capillary: 105 mg/dL — ABNORMAL HIGH (ref 70–99)

## 2022-06-04 LAB — TSH: TSH: 0.926 u[IU]/mL (ref 0.350–4.500)

## 2022-06-04 SURGERY — IR WITH ANESTHESIA
Anesthesia: General

## 2022-06-04 MED ORDER — FENTANYL CITRATE PF 50 MCG/ML IJ SOSY
25.0000 ug | PREFILLED_SYRINGE | Freq: Once | INTRAMUSCULAR | Status: AC
  Administered 2022-06-04: 25 ug via INTRAVENOUS

## 2022-06-04 MED ORDER — PROPOFOL 1000 MG/100ML IV EMUL
5.0000 ug/kg/min | INTRAVENOUS | Status: DC
  Administered 2022-06-04: 75 ug/kg/min via INTRAVENOUS

## 2022-06-04 MED ORDER — SENNOSIDES-DOCUSATE SODIUM 8.6-50 MG PO TABS: 1.0000 | ORAL_TABLET | Freq: Every evening | ORAL | Status: DC | PRN

## 2022-06-04 MED ORDER — CLEVIDIPINE BUTYRATE 0.5 MG/ML IV EMUL
0.0000 mg/h | INTRAVENOUS | Status: DC
  Administered 2022-06-04 (×2): 2 mg/h via INTRAVENOUS
  Filled 2022-06-04: qty 50

## 2022-06-04 MED ORDER — IOHEXOL 300 MG/ML  SOLN
100.0000 mL | Freq: Once | INTRAMUSCULAR | Status: AC | PRN
  Administered 2022-06-04: 50 mL via INTRA_ARTERIAL

## 2022-06-04 MED ORDER — ACETAMINOPHEN 160 MG/5ML PO SOLN: 650.0000 mg | ORAL | Status: DC | PRN

## 2022-06-04 MED ORDER — FENTANYL BOLUS VIA INFUSION: 25.0000 ug | INTRAVENOUS | Status: DC | PRN

## 2022-06-04 MED ORDER — PROPOFOL 10 MG/ML IV BOLUS
INTRAVENOUS | Status: DC | PRN
  Administered 2022-06-04: 100 mg via INTRAVENOUS

## 2022-06-04 MED ORDER — FAMOTIDINE 40 MG/5ML PO SUSR
20.0000 mg | Freq: Every day | ORAL | Status: DC
  Administered 2022-06-04 – 2022-06-05 (×2): 20 mg
  Filled 2022-06-04 (×3): qty 2.5

## 2022-06-04 MED ORDER — CEFAZOLIN SODIUM-DEXTROSE 2-4 GM/100ML-% IV SOLN
INTRAVENOUS | Status: AC
  Filled 2022-06-04: qty 100

## 2022-06-04 MED ORDER — PROPOFOL 1000 MG/100ML IV EMUL
INTRAVENOUS | Status: AC
  Filled 2022-06-04: qty 100

## 2022-06-04 MED ORDER — IOHEXOL 350 MG/ML SOLN
75.0000 mL | Freq: Once | INTRAVENOUS | Status: AC | PRN
  Administered 2022-06-04: 75 mL via INTRAVENOUS

## 2022-06-04 MED ORDER — SODIUM CHLORIDE 0.9 % IV SOLN: INTRAVENOUS | Status: DC | PRN

## 2022-06-04 MED ORDER — SODIUM CHLORIDE 0.9 % IV SOLN: INTRAVENOUS | Status: DC

## 2022-06-04 MED ORDER — PROPOFOL 500 MG/50ML IV EMUL
INTRAVENOUS | Status: DC | PRN
  Administered 2022-06-04: 100 ug/kg/min via INTRAVENOUS

## 2022-06-04 MED ORDER — ACETAMINOPHEN 325 MG PO TABS: 650.0000 mg | ORAL_TABLET | ORAL | Status: DC | PRN

## 2022-06-04 MED ORDER — ORAL CARE MOUTH RINSE
15.0000 mL | OROMUCOSAL | Status: DC
  Administered 2022-06-04 – 2022-06-05 (×16): 15 mL via OROMUCOSAL

## 2022-06-04 MED ORDER — PROPOFOL 1000 MG/100ML IV EMUL
0.0000 ug/kg/min | INTRAVENOUS | Status: DC
  Administered 2022-06-04: 25 ug/kg/min via INTRAVENOUS
  Administered 2022-06-04: 30 ug/kg/min via INTRAVENOUS
  Administered 2022-06-05: 25 ug/kg/min via INTRAVENOUS
  Filled 2022-06-04 (×3): qty 100
  Filled 2022-06-04: qty 200

## 2022-06-04 MED ORDER — ACETAMINOPHEN 650 MG RE SUPP: 650.0000 mg | RECTAL | Status: DC | PRN

## 2022-06-04 MED ORDER — CEFAZOLIN SODIUM-DEXTROSE 2-3 GM-%(50ML) IV SOLR
INTRAVENOUS | Status: DC | PRN
  Administered 2022-06-04: 2 g via INTRAVENOUS

## 2022-06-04 MED ORDER — PHENYLEPHRINE HCL (PRESSORS) 10 MG/ML IV SOLN: 0.1000 ug/kg/min | INTRAVENOUS | Status: DC

## 2022-06-04 MED ORDER — NOREPINEPHRINE 4 MG/250ML-% IV SOLN
2.0000 ug/min | INTRAVENOUS | Status: DC
  Administered 2022-06-04 (×2): 2 ug/min via INTRAVENOUS
  Filled 2022-06-04 (×2): qty 250

## 2022-06-04 MED ORDER — POLYETHYLENE GLYCOL 3350 17 G PO PACK: 17.0000 g | PACK | Freq: Every day | ORAL | Status: DC

## 2022-06-04 MED ORDER — ATROPINE SULFATE 1 MG/10ML IJ SOSY
PREFILLED_SYRINGE | INTRAMUSCULAR | Status: AC
  Filled 2022-06-04: qty 20

## 2022-06-04 MED ORDER — ACETAMINOPHEN 325 MG PO TABS
650.0000 mg | ORAL_TABLET | ORAL | Status: DC | PRN
  Administered 2022-06-08 – 2022-06-09 (×2): 650 mg via ORAL
  Filled 2022-06-04 (×2): qty 2

## 2022-06-04 MED ORDER — FENTANYL 2500MCG IN NS 250ML (10MCG/ML) PREMIX INFUSION
25.0000 ug/h | INTRAVENOUS | Status: DC
  Administered 2022-06-04: 25 ug/h via INTRAVENOUS
  Filled 2022-06-04: qty 250

## 2022-06-04 MED ORDER — LEVOTHYROXINE SODIUM 112 MCG PO TABS
112.0000 ug | ORAL_TABLET | Freq: Every day | ORAL | Status: DC
  Administered 2022-06-04 – 2022-06-11 (×8): 112 ug
  Filled 2022-06-04 (×8): qty 1

## 2022-06-04 MED ORDER — IOHEXOL 350 MG/ML SOLN: 75.0000 mL | Freq: Once | INTRAVENOUS | Status: DC | PRN

## 2022-06-04 MED ORDER — DOCUSATE SODIUM 50 MG/5ML PO LIQD: 100.0000 mg | Freq: Two times a day (BID) | ORAL | Status: DC

## 2022-06-04 MED ORDER — ORAL CARE MOUTH RINSE: 15.0000 mL | OROMUCOSAL | Status: DC | PRN

## 2022-06-04 MED ORDER — SUCCINYLCHOLINE CHLORIDE 200 MG/10ML IV SOSY
PREFILLED_SYRINGE | INTRAVENOUS | Status: DC | PRN
  Administered 2022-06-04: 100 mg via INTRAVENOUS

## 2022-06-04 MED ORDER — PHENYLEPHRINE HCL-NACL 20-0.9 MG/250ML-% IV SOLN
0.0000 ug/min | INTRAVENOUS | Status: DC
  Administered 2022-06-04: 20 ug/min via INTRAVENOUS

## 2022-06-04 MED ORDER — EPHEDRINE SULFATE-NACL 50-0.9 MG/10ML-% IV SOSY
PREFILLED_SYRINGE | INTRAVENOUS | Status: DC | PRN
  Administered 2022-06-04 (×2): 5 mg via INTRAVENOUS

## 2022-06-04 MED ORDER — PHENYLEPHRINE 80 MCG/ML (10ML) SYRINGE FOR IV PUSH (FOR BLOOD PRESSURE SUPPORT)
PREFILLED_SYRINGE | INTRAVENOUS | Status: DC | PRN
  Administered 2022-06-04 (×8): 80 ug via INTRAVENOUS

## 2022-06-04 MED ORDER — ROCURONIUM BROMIDE 50 MG/5ML IV SOSY
PREFILLED_SYRINGE | INTRAVENOUS | Status: DC | PRN
  Administered 2022-06-04: 20 mg via INTRAVENOUS
  Administered 2022-06-04: 50 mg via INTRAVENOUS

## 2022-06-04 MED ORDER — CHLORHEXIDINE GLUCONATE CLOTH 2 % EX PADS
6.0000 | MEDICATED_PAD | Freq: Every day | CUTANEOUS | Status: DC
  Administered 2022-06-04 – 2022-06-11 (×7): 6 via TOPICAL

## 2022-06-04 MED ORDER — SODIUM CHLORIDE 0.9 % IV SOLN: 250.0000 mL | INTRAVENOUS | Status: DC

## 2022-06-04 MED ORDER — ATORVASTATIN CALCIUM 80 MG PO TABS
80.0000 mg | ORAL_TABLET | Freq: Every day | ORAL | Status: DC
  Administered 2022-06-04 – 2022-06-05 (×2): 80 mg
  Filled 2022-06-04 (×3): qty 1

## 2022-06-04 MED ORDER — STROKE: EARLY STAGES OF RECOVERY BOOK
Freq: Once | Status: AC
  Filled 2022-06-04: qty 1

## 2022-06-04 NOTE — Anesthesia Procedure Notes (Signed)
Procedure Name: Intubation Date/Time: 06/04/2022 9:07 AM  Performed by: Adria Dill, CRNAPre-anesthesia Checklist: Patient identified, Emergency Drugs available, Suction available and Patient being monitored Patient Re-evaluated:Patient Re-evaluated prior to induction Oxygen Delivery Method: Circle system utilized Preoxygenation: Pre-oxygenation with 100% oxygen Induction Type: IV induction, Rapid sequence and Cricoid Pressure applied Laryngoscope Size: Miller and 3 Grade View: Grade I Tube type: Oral Tube size: 7.5 mm Number of attempts: 1 Airway Equipment and Method: Stylet Placement Confirmation: ETT inserted through vocal cords under direct vision, positive ETCO2 and breath sounds checked- equal and bilateral Secured at: 21 cm Tube secured with: Tape Dental Injury: Teeth and Oropharynx as per pre-operative assessment

## 2022-06-04 NOTE — Progress Notes (Signed)
Echo attempted at 9:30, patient is in IR at this time. Will re-attempt as schedule permits.  [facility] Darreld Hoffer RDCS

## 2022-06-04 NOTE — Consult Note (Signed)
NAME:  Rebekah Pope, MRN:  [id number], DOB:  [birth year], LOS: 0 ADMISSION DATE:  06/04/2022, CONSULTATION DATE:  12/28 REFERRING MD:  Dr. Wilford Corner, CHIEF COMPLAINT: L ICA ischemic stroke  History of Present Illness:  Patient is a 81 year old female with pertinent PMH of embolic strokes, A-fib on AC, HTN, HLD, sinus syndrome HFpEF, severe aortic stenosis post TAVR presents to [facility] on 12/28 with unresponsiveness.  Patient's LKN 7 AM on 12/28.  Per husband patient woke up and went to the bathroom and came back to bed and was noted to be unresponsive.  EMS called and was transferred to [facility] ED.  Noticed a leftward gaze and nonresponsive.  Not moving right side.  Upon arrival to [facility] ED on 12/28, patient altered not following commands.  Right hemiparesis and right facial droop appreciated w/aphasia.  Neuro consulted and exam consistent with L MCA stroke.  CT head showing occlusion of left ICA extending to M1 to M2 branches; occluded bilateral PCAs.  Patient taken to IR for mechanical thrombectomy.  Post thrombectomy patient remained intubated and transferred to neuro ICU.  PCCM consulted.  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 Excell Seltzer & Dr. Laneta Simmers   Severe aortic stenosis    Sinus bradycardia    Stroke (cerebrum) (HCC)      Significant Hospital  Events: Including procedures, antibiotic start and stop dates in addition to other pertinent events   12/28 admitted to [facility] ED L MCA ischemic stroke; went to IR for mechanical thrombectomy; intubated postop; PCCM consulted  Interim History / Subjective:  Patient recently received paralytics and is on propofol. On vent PRVC Neo at 20  Objective   Pulse 66, weight 87.8 kg.    Vent Mode: PRVC FiO2 (%):  [100 %] 100 % Set Rate:  [18 bmp] 18 bmp Vt Set:  [420 mL] 420 mL PEEP:  [5 cmH20] 5 cmH20 Plateau Pressure:  [17 cmH20] 17 cmH20   Intake/Output Summary (Last 24 hours) at 06/04/2022 1051 Last data filed at 06/04/2022 1029 Gross per 24 hour  Intake 650 ml  Output 350 ml  Net 300 ml   Filed Weights   06/04/22 0800  Weight: 87.8 kg    Examination: General:   critically ill elderly appearing female on mech vent HEENT: MM pink/moist; ETT in place Neuro: sedated CV: s1s2, brady 40s, no m/r/g PULM:  dim clear BS bilaterally; on mech vent PRVC GI: soft, bsx4 active  Extremities: warm/dry, no edema  Skin: no rashes or lesions appreciated   Resolved Hospital Problem list     Assessment & Plan:   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  -PT/OT/SLP when appropriate  Post op vent management Plan: -LTVV strategy with tidal volumes of 6-8 cc/kg ideal body weight -ABG and CXR reviewed -Wean PEEP/FiO2 for SpO2 >92% -VAP bundle in place -Daily SAT and SBT -PAD protocol in place -wean sedation for RASS goal 0 to -1  Hx of Bradycardia: Family states HR  40s to 50s at baseline; seen by cardiology last admission 05/15/2022 and did not recommend PPM at this time Sick sinus syndrome Paroxysmal A-fib on Eliquis Plan: -Telemetry monitoring -Hold home amiodarone while bradycardic -Hold home [facility]; SCDs for DVT prophylaxis -continue to f/u w/ cardiology outpt  HFpEF Severe arctic stenosis status post TAVR in September  Plan: -Echo pending -Daily weights; strict I/os  CAD HTN HLD Plan: -Cleviprex/levo for BP goal above -DAPT per neuro -Resume home statin  Hypothyroidism Plan: -Continue home Synthroid -Check TSH    Best Practice (right click and "Reselect all SmartList Selections" daily)   Diet/type: NPO w/ meds via tube DVT prophylaxis: SCD GI prophylaxis: H2B Lines: Arterial Line Foley:  N/A Code Status:  full code Last date of multidisciplinary goals of care discussion [Per primary]  Labs   CBC: Recent Labs  Lab 06/04/22 0835 06/04/22 0837  WBC 8.1  --   NEUTROABS 4.0  --   HGB 11.6* 11.2*  HCT 34.1* 33.0*  MCV 88.6  --   PLT 166  --     Basic Metabolic Panel: Recent Labs  Lab 06/04/22 0835 06/04/22 0837  NA 141 141  K 3.7 3.7  CL 109 107  CO2 24  --   GLUCOSE 108* 107*  BUN 20 22  CREATININE 0.91 0.90  CALCIUM 8.6*  --    GFR: Estimated Creatinine Clearance: 49.8 mL/min (by C-G formula based on SCr of 0.9 mg/dL). Recent Labs  Lab 06/04/22 0835  WBC 8.1    Liver Function Tests: Recent Labs  Lab 06/04/22 0835  AST 23  ALT 19  ALKPHOS 76  BILITOT 0.8  PROT 5.8*  ALBUMIN 3.2*   No results for input(s): "LIPASE", "AMYLASE" in the last 168 hours. No results for input(s): "AMMONIA" in the last 168 hours.  ABG    Component Value Date/Time   PHART 7.403 10/31/2021 0928   PCO2ART 38.0 10/31/2021 0928   PO2ART 88 10/31/2021 0928   HCO3 23.7 10/31/2021 0928   TCO2 23 06/04/2022 0837   ACIDBASEDEF 1.0 10/31/2021 0928   O2SAT 97 10/31/2021 0928     Coagulation Profile: Recent Labs  Lab  06/04/22 0835  INR 1.3*    Cardiac Enzymes: No results for input(s): "CKTOTAL", "CKMB", "CKMBINDEX", "TROPONINI" in the last 168 hours.  HbA1C: Hgb A1c MFr Bld  Date/Time Value Ref Range Status  05/10/2022 05:11 AM 6.0 (H) 4.8 - 5.6 % Final    Comment:    (NOTE)         Prediabetes: 5.7 - 6.4         Diabetes: >6.4         Glycemic control for adults with diabetes: <7.0     CBG: Recent Labs  Lab 06/04/22 0832  GLUCAP 105*    Review of Systems:   Patient is encephalopathic and/or intubated. Therefore history has been obtained from chart review.    Past Medical  History:  She,  has a past medical history of Allergic rhinitis, CAD (coronary artery disease), Chronic diastolic CHF (congestive heart failure) (HCC), Chronic kidney disease, stage 3b (HCC), Complex regional pain syndrome i of unspecified lower limb, Dizziness and giddiness, Essential (primary) hypertension, History of hiatal hernia, Hyperlipidemia, Hypothyroidism, LBBB (left bundle branch block), Localized edema, Paroxysmal atrial fibrillation (HCC), PONV (postoperative nausea and vomiting), S/P TAVR (transcatheter aortic valve replacement) (02/10/2022), Severe aortic stenosis, Sinus bradycardia, and Stroke (cerebrum) (HCC).   Surgical History:   Past Surgical History:  Procedure Laterality Date   CESAREAN SECTION     INTRAOPERATIVE TRANSTHORACIC ECHOCARDIOGRAM N/A 02/10/2022   Procedure: INTRAOPERATIVE TRANSTHORACIC ECHOCARDIOGRAM;  Surgeon: Tonny Bollman, MD;  Location: [facility] INVASIVE CV LAB;  Service: Open Heart Surgery;  Laterality: N/A;   RIGHT HEART CATH AND CORONARY ANGIOGRAPHY N/A 10/31/2021   Procedure: RIGHT HEART CATH AND CORONARY ANGIOGRAPHY;  Surgeon: Kathleene Hazel, MD;  Location: [facility] INVASIVE CV LAB;  Service: Cardiovascular;  Laterality: N/A;   TRANSCATHETER AORTIC VALVE REPLACEMENT, TRANSFEMORAL N/A 02/10/2022   Procedure: Transcatheter Aortic Valve Replacement, Transfemoral;  Surgeon: Tonny Bollman, MD;  Location: [facility] INVASIVE CV LAB;  Service: Open Heart Surgery;  Laterality: N/A;     Social History:   reports that she has never smoked. She has been exposed to tobacco smoke. She has never used smokeless tobacco. She reports that she does not drink alcohol and does not use drugs.   Family History:  Her family history includes Dementia in her mother; Heart disease in her father; Hip fracture in her mother; Pneumonia in her mother.   Allergies No Known Allergies   Home Medications  Prior to Admission medications   Medication Sig Start Date End Date Taking? Authorizing Provider  acetaminophen (TYLENOL) 325 MG tablet Take 2 tablets (650 mg total) by mouth every 6 (six) hours as needed for mild pain, fever or headache. 05/19/22   Angiulli, Mcarthur Rossetti, PA-C  amiodarone (PACERONE) 200 MG tablet Take 0.5 tablets (100 mg total) by mouth daily. 05/19/22   Angiulli, Mcarthur Rossetti, PA-C  amLODipine (NORVASC) 10 MG tablet Take 1 tablet (10 mg total) by mouth at bedtime. 05/19/22   Angiulli, Mcarthur Rossetti, PA-C  apixaban (ELIQUIS) 5 MG TABS tablet Take 1 tablet (5 mg total) by mouth 2 (two) times daily. 05/19/22   Angiulli, Mcarthur Rossetti, PA-C  atorvastatin (LIPITOR) 80 MG tablet Take 1 tablet (80 mg total) by mouth daily. 05/19/22   Angiulli, Mcarthur Rossetti, PA-C  ENTRESTO 24-26 MG Take 1 tablet by mouth 2 (two) times daily. 05/19/22   Angiulli, Mcarthur Rossetti, PA-C  hydrALAZINE (APRESOLINE) 25 MG tablet Take 1 tablet (25 mg total) by mouth 3 (three) times daily. 05/19/22   Angiulli, Mcarthur Rossetti, PA-C  levothyroxine (SYNTHROID) 112 MCG tablet Take 1 tablet (112 mcg total) by mouth daily before breakfast. 05/19/22   Angiulli, Mcarthur Rossetti, PA-C  oxyCODONE (OXY IR/ROXICODONE) 5 MG immediate release tablet Take 1 tablet (5 mg total) by mouth every 4 (four) hours as needed for moderate pain or severe pain. 05/19/22   Charlton Amor, PA-C     Critical care time: 45 minutes    JD Anselm Lis [facility] 06/04/2022, 10:51 AM  Please see [email or web address] for pager details.  From 7A-7P if no response, please call [phone]. After hours, please call ELink [phone].

## 2022-06-04 NOTE — Anesthesia Procedure Notes (Signed)
Arterial Line Insertion Start/End12/28/2023 9:02 AM, 06/04/2022 9:07 AM Performed by: CRNA  Patient location: OOR procedure area. Preanesthetic checklist: patient identified, IV checked, site marked, risks and benefits discussed, surgical consent, monitors and equipment checked, pre-op evaluation, timeout performed and anesthesia consent Emergency situation Left, radial was placed Catheter size: 20 G Hand hygiene performed , maximum sterile barriers used  and Seldinger technique used Allen's test indicative of satisfactory collateral circulation Attempts: 1 Procedure performed without using ultrasound guided technique. Following insertion, dressing applied and Biopatch. Post procedure assessment: normal  Patient tolerated the procedure well with no immediate complications.

## 2022-06-04 NOTE — ED Triage Notes (Signed)
Pt bib ems from home; LKW 0730 this am; per husband, pt walked to restroom this am, came back and became unresponsive; pt has L sided gaze, aphasia, R sided weakness; hx stroke, afib; pt on eliquis; pt able to follow some commands; bp 176/80, cbg 111, hr 60, 97% RA

## 2022-06-04 NOTE — Anesthesia Preprocedure Evaluation (Signed)
Anesthesia Evaluation  Patient identified by MRN, date of birth, ID band Patient awake    Reviewed: Allergy & Precautions, Patient's Chart, lab work & pertinent test resultsPreop documentation limited or incomplete due to emergent nature of procedure.  History of Anesthesia Complications (+) PONV  Airway        Dental   Pulmonary neg pulmonary ROS          Cardiovascular hypertension, + CAD and +CHF  + dysrhythmias + Valvular Problems/Murmurs AS   Echo 03/2022   1. Left ventricular ejection fraction, by estimation, is 60 to 65%. The left ventricle has normal function. The left ventricle has no regional wall motion abnormalities. Left ventricular diastolic function could not be evaluated.  2. Right ventricular systolic function is normal. The right ventricular size is normal. There is normal pulmonary artery systolic pressure. The estimated right ventricular systolic pressure is 28.4 mmHg.  3. Left atrial size was mild to moderately dilated.  4. Right atrial size was mildly dilated.  5. The mitral valve is grossly normal. Mild mitral valve regurgitation. Mild mitral stenosis. The mean mitral valve gradient is 4.0 mmHg with average heart rate of 46 bpm. Severe mitral annular calcification.  6. The aortic valve has been repaired/replaced. Aortic valve regurgitation is not visualized. There is a 26 mm Medtronic CoreValve-Evolut Pro prosthetic (TAVR) valve present in the aortic position. Procedure Date: 02/10/2022. Echo findings are consistent with normal structure and function of the aortic valve prosthesis. Aortic valve area, by VTI measures 2.27 cm. Aortic valve mean gradient measures 7.0 mmHg. Aortic valve Vmax measures 1.88 m/s.  7. The inferior vena cava is dilated in size with >50% respiratory variability, suggesting right atrial pressure of 8 mmHg.  Comparison(s): No significant change from prior study.      Neuro/Psych CVA    GI/Hepatic Neg liver ROS, hiatal hernia,,,  Endo/Other  Hypothyroidism    Renal/GU Renal disease     Musculoskeletal negative musculoskeletal ROS (+)    Abdominal   Peds  Hematology negative hematology ROS (+)   Anesthesia Other Findings   Reproductive/Obstetrics                             Anesthesia Physical Anesthesia Plan  ASA: 4 and emergent  Anesthesia Plan: General   Post-op Pain Management: Minimal or no pain anticipated   Induction:   PONV Risk Score and Plan: 4 or greater and Ondansetron, Dexamethasone and Treatment may vary due to age or medical condition  Airway Management Planned: Oral ETT  Additional Equipment: Arterial line  Intra-op Plan:   Post-operative Plan: Extubation in OR  Informed Consent:   Plan Discussed with:   Anesthesia Plan Comments:        Anesthesia Quick Evaluation

## 2022-06-04 NOTE — H&P (Signed)
Stroke Neurology Admission History & Physical   CC: AMS  History is obtained from: Daughter-Rita Manson Passey over the phone, chart, EMS  HPI: Rebekah Pope is a 81 y.o. 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 Last known well is about 7 AM when she woke up and went to the bathroom and upon coming back laid in bed and was noted to be unresponsive by the husband.  EMS was called.  She had a leftward gaze, was mute and not moving her right side-code stroke was activated and she was brought in for emergent evaluation. Seen at the bridge of the emergency room.  Exam consistent with a LVO-likely MCA on the left.  Noncontrast head CT unremarkable for bleed.  CT angiography head and neck With occlusion of the left intracranial ICA at the supraclinoid segment extending into the M1 segment with further occlusion of the M2 branches in the sylvian fissure.  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    LKW: 7 AM IV thrombolysis given?: no, on DOAC-last dose likely last night Premorbid modified Rankin scale (mRS):2-3 According to the daughter, she has been somewhat slow since her TAVR in September but made good progress with rehab after the recent strokes.  She used cane or walker for support due to deconditioning but is fairly independent otherwise.  ROS: Unable to obtain due to altered mental status.   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 Excell Seltzer & Dr. Laneta Simmers   Severe aortic stenosis    Sinus bradycardia    Stroke (cerebrum) (HCC)      Family History  Problem Relation Age of Onset   Dementia Mother    Pneumonia Mother    Hip fracture Mother    Heart disease Father      Social History:   reports that she has never smoked. She has been exposed to tobacco smoke. She has never used smokeless tobacco. She reports that she does not drink alcohol and does not use drugs.  Medications  Current Facility-Administered Medications:    [START ON 06/05/2022]  stroke: early stages of recovery book, , Does not apply, Once, Milon Dikes, MD   0.9 %  sodium chloride infusion, , Intravenous, Continuous, Milon Dikes, MD   acetaminophen (TYLENOL) tablet 650 mg, 650 mg, Oral, Q4H PRN **OR** acetaminophen (TYLENOL) 160 MG/5ML solution 650 mg, 650 mg, Per Tube, Q4H PRN **OR** acetaminophen (TYLENOL) suppository 650 mg, 650 mg, Rectal, Q4H PRN, Milon Dikes, MD   senna-docusate (Senokot-S) tablet 1 tablet, 1 tablet, Oral, QHS PRN, Milon Dikes, MD  Current Outpatient Medications:    acetaminophen (TYLENOL) 325 MG tablet, Take  2 tablets (650 mg total) by mouth every 6 (six) hours as needed for mild pain, fever or headache., Disp: , Rfl:    amiodarone (PACERONE) 200 MG tablet, Take 0.5 tablets (100 mg total) by mouth daily., Disp: 30 tablet, Rfl: 0   amLODipine (NORVASC) 10 MG tablet, Take 1 tablet (10 mg total) by mouth at bedtime., Disp: 30 tablet, Rfl: 0   apixaban (ELIQUIS) 5 MG TABS tablet, Take 1 tablet (5 mg total) by mouth 2 (two) times daily., Disp: 60 tablet, Rfl: 0   atorvastatin (LIPITOR) 80 MG tablet, Take 1 tablet (80 mg total) by mouth daily., Disp:  30 tablet, Rfl: 0   ENTRESTO 24-26 MG, Take 1 tablet by mouth 2 (two) times daily., Disp: 60 tablet, Rfl: 0   hydrALAZINE (APRESOLINE) 25 MG tablet, Take 1 tablet (25 mg total) by mouth 3 (three) times daily., Disp: 90 tablet, Rfl: 0   levothyroxine (SYNTHROID) 112 MCG tablet, Take 1 tablet (112 mcg total) by mouth daily before breakfast., Disp: 30 tablet, Rfl: 0   oxyCODONE (OXY IR/ROXICODONE) 5 MG immediate release tablet, Take 1 tablet (5 mg total) by mouth every 4 (four) hours as needed for moderate pain or severe pain., Disp: 30 tablet, Rfl: 0   Exam: Current vital signs: Wt 87.8 kg   BMI 34.29 kg/m  Vital signs in last 24 hours: Pulse Rate:  [52-53] 52 (12/27 1434) BP: (155)/(71) 155/71 (12/27 1417) SpO2:  [95 %] 95 % (12/27 1417) Weight:  [86.2 kg-87.8 kg] 87.8 kg (12/28 0800) General: She is drowsy HEENT: Normocephalic/atraumatic Lungs: Clear Vascular: Regular rate and rhythm Extremities warm perfused Neurological exam Drowsy, does not open eyes to voice Resists active eye opening Left gaze preference No blink to threat from the right, right facial weakness. Flaccid right upper extremity.  Minimal movement to noxious simulation in the right lower extremity.  Antigravity without drift in the left upper extremity.  Stronger yet not antigravity left lower extremity. Unable to assess coordination NIH stroke scale 1a Level of Conscious.: 1 1b LOC Questions: 2 1c LOC Commands: 2 2 Best Gaze: 2 3 Visual: 2 4 Facial Palsy: 2 5a Motor Arm - left: 0 5b Motor Arm - Right: 4 6a Motor Leg - Left: 2 6b Motor Leg - Right: 3 7 Limb Ataxia: 0 8 Sensory: 2 9 Best Language: 3 10 Dysarthria: 2 11 Extinct. and Inatten.: 0 TOTAL: 27   Labs I have reviewed labs in epic and the results pertinent to this consultation are:  CBC    Component Value Date/Time   WBC 8.1 06/04/2022 0835   RBC 3.85 (L) 06/04/2022 0835   HGB 11.2 (L) 06/04/2022 0837   HGB 13.0 03/11/2022 1131   HCT  33.0 (L) 06/04/2022 0837   HCT 39.6 03/11/2022 1131   PLT 166 06/04/2022 0835   PLT 221 03/11/2022 1131   MCV 88.6 06/04/2022 0835   MCV 88 03/11/2022 1131   MCH 30.1 06/04/2022 0835   MCHC 34.0 06/04/2022 0835   RDW 13.4 06/04/2022 0835   RDW 13.5 03/11/2022 1131   LYMPHSABS 2.9 06/04/2022 0835   MONOABS 0.5 06/04/2022 0835   EOSABS 0.5 06/04/2022 0835   BASOSABS 0.1 06/04/2022 0835    CMP     Component Value Date/Time   NA 141 06/04/2022 0837   NA 139 03/11/2022 1131   K 3.7 06/04/2022 0837   CL 107 06/04/2022 0837   CO2 26 05/21/2022 0924   GLUCOSE 107 (H) 06/04/2022 3299  BUN 22 06/04/2022 0837   BUN 25 03/11/2022 1131   CREATININE 0.90 06/04/2022 0837   CALCIUM 8.8 (L) 05/21/2022 0924   PROT 5.7 (L) 05/21/2022 0924   ALBUMIN 3.1 (L) 05/21/2022 0924   AST 22 05/21/2022 0924   ALT 16 05/21/2022 0924   ALKPHOS 60 05/21/2022 0924   BILITOT 0.2 (L) 05/21/2022 0924   GFRNONAA 45 (L) 05/21/2022 0924    Lipid Panel     Component Value Date/Time   CHOL 268 (H) 05/10/2022 0511   TRIG 27 05/10/2022 0511   HDL 87 05/10/2022 0511   CHOLHDL 3.1 05/10/2022 0511   VLDL 5 05/10/2022 0511   LDLCALC 176 (H) 05/10/2022 0511     Imaging I have reviewed the images obtained:  CT-head-no acute changes.  Question dense left MCA.  Aspects 10 CT angiography head and neck: occlusion of the left intracranial ICA at the supraclinoid segment extending into the M1 segment with further occlusion of the M2 branches in the sylvian fissure.  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  Assessment:  81 year old past history of recent embolic strokes with good functional recovery, sick sinus syndrome, paroxysmal A-fib on anticoagulation with DOAC, hypertension, hyperlipidemia, obstructive CAD, HFpEF, severe arctic stenosis post TAVR in September presenting with sudden onset of unresponsiveness.  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 I called the daughter, who is in [place].  She had recently seen the patient as late as yesterday before leaving back to [place].  I discussed the risks and benefits and alternatives of endovascular treatment-with the clear understanding that her recent strokes as well as her being on DOAC and poor cardiac function put her at an increased chance for adverse events as well as complications during the procedure as well as diminish chances for good outcome. That said, I did tell her that not doing the procedure would lead to complete right-sided paralysis and inability to speak or understand.  The daughter was rightfully very worried but decided to consent for endovascular intervention.  This was witnessed by the rapid response RN over the phone.  Attempts were made to reach the husband, who does not carry his cell phone and we were not able to locate him in the ER at the time of this decision.  Plan:  Acute Ischemic Stroke Cerebral infarction due to embolism of left middle cerebral artery  Possible cerebral infarction due to embolism of right posterior cerebral artery  Possible cerebral infarction due to embolism of left posterior cerebral artery    Acuity: Acute Current Suspected Etiology: Continue Evaluation:  -Admit to: -Continue Aspirin/ Statin -Continue Statin -Hold Aspirin until 24 hour post IV thrombolysis (tPA or TNKase) neuroimaging is stable and without evidence of bleeding -Blood pressure control, goal of SYS < -MRI/ECHO/A1C/Lipid panel. -Hyperglycemia management per SSI to maintain glucose 140-180mg /dL. -PT/OT/ST therapies and recommendations when able  CNS Close neuromonitoring N.p.o. until cleared by speech Workup as above. PT OT and PMR consult-for hemiplegia hemiparesis of  the dominant right side  RESP Intubated for procedure Attempt to extubate if possible If not, PCCM assistance will be requested for vent management in the ICU  CV Aortic stenosis status post TAVR Hypertension CAD HFpEF - Blood pressure goal if successful revascularization 120-140 systolic. - If unsuccessful revascularization, allow permissive hypertension. -  If any bleed, systolic blood pressure goal 130-150. - Transthoracic echo - Statin for LDL less than 70 - May need to involve cardiology given complicated cardiac history.  Will await procedure completion. -Hold DOAC for now.  May need to start IV heparin stroke protocol depending on the outcome of the intervention  HEME Iron Deficiency Anemia  -Monitor -transfuse for hgb < 7  Coagulopathy secondary to anticoagulation -Monitor labs.  ENDO -goal HgbA1c < 7  GI/GU No active issues  Fluid/Electrolyte Disorders No activity-check BMP in the morning  ID Possible Aspiration PNA -CXR -NPO -Monitor  Prophylaxis DVT: SCD GI: PPI Bowel: Docusate  Diet: NPO until cleared by speech/bedside swallow  Code Status: Full Code    THE FOLLOWING WERE PRESENT ON ADMISSION: Acute ischemic stroke, aortic stenosis status post TAVR, CAD, HFpEF, hemiplegia or hemiparesis, possible aspiration pneumonia  Risks, benefits, alternatives of DVT were discussed and daughter, Ms. Alonna Bucklerita Brown, agreed to proceed. CT imaging was reviewed personally, as well as with neuroradiology and interventional neuroradiology prior to consenting for the procedure.  -- Milon DikesAshish Solange Emry, MD Neurologist [facility] Pager: [phone]   CRITICAL CARE ATTESTATION Performed by: Milon DikesAshish Tyreek Clabo, MD Total critical care time: 40 minutes Critical care time was exclusive of separately billable procedures and treating other patients and/or supervising APPs/Residents/Students Critical care was necessary to treat or prevent imminent or life-threatening  deterioration. This patient is critically ill and at significant risk for neurological worsening and/or death and care requires constant monitoring. Critical care was time spent personally by me on the following activities: development of treatment plan with patient and/or surrogate as well as nursing, discussions with consultants, evaluation of patient's response to treatment, examination of patient, obtaining history from patient or surrogate, ordering and performing treatments and interventions, ordering and review of laboratory studies, ordering and review of radiographic studies, pulse oximetry, re-evaluation of patient's condition, participation in multidisciplinary rounds and medical decision making of high complexity in the care of this patient.

## 2022-06-04 NOTE — Progress Notes (Signed)
Echocardiogram 2D Echocardiogram has been performed.  Rebekah Pope 06/04/2022, 1:52 PM

## 2022-06-04 NOTE — ED Provider Notes (Signed)
[facility] EMERGENCY DEPARTMENT Provider Note  CSN: [id number] Arrival date & time: 06/04/22 [id number]  Chief Complaint(s) Code Stroke  HPI Rebekah Pope is a 81 y.o. female with history of CHF, CKD, hypertension, paroxysmal A-fib on Eliquis, prior TAVR presenting with altered mental status.  Patient last known well 7:30 AM, found by husband unresponsive, right-sided weakness, not speaking normally.  History severely limited as patient aphasic, acuity of condition   Past Medical History Past Medical History:  Diagnosis Date   Allergic rhinitis    CAD (coronary artery disease)    Chronic diastolic CHF (congestive heart failure) (HCC)    Chronic kidney disease, stage 3b (HCC)    Complex regional pain syndrome i of unspecified lower limb    Dizziness and giddiness    Essential (primary) hypertension    History of hiatal hernia    Hyperlipidemia    Hypothyroidism    LBBB (left bundle branch block)    Localized edema    Paroxysmal atrial fibrillation (HCC)    PONV (postoperative nausea and vomiting)    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 Burt Knack & Dr. 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 Authorizing Provider  acetaminophen (TYLENOL) 325 MG tablet Take 2  tablets (650 mg total) by mouth every 6 (six) hours as needed for mild pain, fever or headache. 05/19/22   Angiulli, Lavon Paganini, PA-C  amiodarone (PACERONE) 200 MG tablet Take 0.5 tablets (100 mg total) by mouth daily. 05/19/22   Angiulli, Lavon Paganini, PA-C  amLODipine (NORVASC) 10 MG tablet Take 1 tablet (10 mg total) by mouth at bedtime. 05/19/22   Angiulli, Lavon Paganini, PA-C  apixaban (ELIQUIS) 5 MG TABS tablet Take 1 tablet (5 mg total) by mouth 2 (two) times daily. 05/19/22   Angiulli, Lavon Paganini, PA-C  atorvastatin (LIPITOR) 80 MG tablet Take 1 tablet (80 mg total) by mouth daily. 05/19/22   Angiulli, Lavon Paganini, PA-C  ENTRESTO 24-26 MG Take 1 tablet by mouth 2 (two) times daily. 05/19/22   Angiulli, Lavon Paganini, PA-C  hydrALAZINE (APRESOLINE) 25 MG tablet Take 1 tablet (25 mg total) by mouth 3 (three) times daily. 05/19/22   Angiulli, Lavon Paganini, PA-C  levothyroxine (SYNTHROID) 112 MCG tablet Take 1 tablet (112 mcg total) by mouth daily before breakfast. 05/19/22   Angiulli, Lavon Paganini, PA-C  oxyCODONE (OXY IR/ROXICODONE) 5 MG immediate release tablet Take 1 tablet (5 mg total) by mouth every 4 (four) hours as needed for moderate pain or severe pain. 05/19/22   Angiulli, Lavon Paganini, PA-C  Past Surgical History Past Surgical History:  Procedure Laterality Date   CESAREAN SECTION     INTRAOPERATIVE TRANSTHORACIC ECHOCARDIOGRAM N/A 02/10/2022   Procedure: INTRAOPERATIVE TRANSTHORACIC ECHOCARDIOGRAM;  Surgeon: Sherren Mocha, MD;  Location: [place] [facility];  Service: Open Heart Surgery;  Laterality: N/A;   RIGHT HEART CATH AND CORONARY ANGIOGRAPHY N/A 10/31/2021   Procedure: RIGHT HEART CATH AND CORONARY ANGIOGRAPHY;  Surgeon: Burnell Blanks, MD;  Location: [place] [facility];  Service: Cardiovascular;  Laterality: N/A;   TRANSCATHETER AORTIC VALVE REPLACEMENT, TRANSFEMORAL N/A  02/10/2022   Procedure: Transcatheter Aortic Valve Replacement, Transfemoral;  Surgeon: Sherren Mocha, MD;  Location: [place] [facility];  Service: Open Heart Surgery;  Laterality: N/A;   Family History Family History  Problem Relation Age of Onset   Dementia Mother    Pneumonia Mother    Hip fracture Mother    Heart disease Father     Social History Social History   Tobacco Use   Smoking status: Never    Passive exposure: Past   Smokeless tobacco: Never  Vaping Use   Vaping Use: Never used  Substance Use Topics   Alcohol use: Never   Drug use: Never   Allergies Patient has no known allergies.  Review of Systems Review of Systems  Unable to perform ROS: Acuity of condition    Physical Exam Vital Signs  I have reviewed the triage vital signs Wt 87.8 kg   BMI 34.29 kg/m  Physical Exam Vitals and nursing note reviewed.  Constitutional:      General: She is in acute distress.  HENT:     Head: Normocephalic and atraumatic.     Mouth/Throat:     Mouth: Mucous membranes are moist.  Eyes:     Conjunctiva/sclera: Conjunctivae normal.  Cardiovascular:     Rate and Rhythm: Normal rate.  Pulmonary:     Effort: Pulmonary effort is normal. No respiratory distress.  Abdominal:     General: Abdomen is flat. There is no distension.  Skin:    General: Skin is warm and dry.     Capillary Refill: Capillary refill takes less than 2 seconds.  Neurological:     Mental Status: She is alert.     Comments: Left gaze deviation, right-sided weakness, severe aphasia  Psychiatric:     Comments: Unable to assess     ED Results and Treatments Labs (all labs ordered are listed, but only abnormal results are displayed) Labs Reviewed  PROTIME-INR - Abnormal; Notable for the following components:      Result Value   Prothrombin Time 16.4 (*)    INR 1.3 (*)    All other components within normal limits  CBC - Abnormal; Notable for the following components:   RBC 3.85 (*)     Hemoglobin 11.6 (*)    HCT 34.1 (*)    All other components within normal limits  CBG MONITORING, ED - Abnormal; Notable for the following components:   Glucose-Capillary 105 (*)    All other components within normal limits  I-STAT CHEM 8, ED - Abnormal; Notable for the following components:   Glucose, Bld 107 (*)    Hemoglobin 11.2 (*)    HCT 33.0 (*)    All other components within normal limits  APTT  DIFFERENTIAL  ETHANOL  COMPREHENSIVE METABOLIC PANEL  RAPID URINE DRUG SCREEN, HOSP PERFORMED  URINALYSIS, ROUTINE W REFLEX MICROSCOPIC  Radiology No results found.  Pertinent labs & imaging results that were available during my care of the patient were reviewed by me and considered in my medical decision making (see MDM for details).  Medications Ordered in ED Medications   stroke: early stages of recovery book (has no administration in time range)  0.9 %  sodium chloride infusion (has no administration in time range)  acetaminophen (TYLENOL) tablet 650 mg (has no administration in time range)    Or  acetaminophen (TYLENOL) 160 MG/5ML solution 650 mg (has no administration in time range)    Or  acetaminophen (TYLENOL) suppository 650 mg (has no administration in time range)  senna-docusate (Senokot-S) tablet 1 tablet (has no administration in time range)  iohexol (OMNIPAQUE) 350 MG/ML injection 75 mL (75 mLs Intravenous Contrast Given 06/04/22 0855)                                                                                                                                     Procedures .Critical Care  Performed by: Cristie Hem, MD Authorized by: Cristie Hem, MD   Critical care provider statement:    Critical care time (minutes):  30   Critical care was necessary to treat or prevent imminent or life-threatening deterioration of the  following conditions:  CNS failure or compromise   Critical care was time spent personally by me on the following activities:  Development of treatment plan with patient or surrogate, discussions with consultants, evaluation of patient's response to treatment, examination of patient, ordering and review of laboratory studies, ordering and review of radiographic studies, ordering and performing treatments and interventions, pulse oximetry, re-evaluation of patient's condition and review of old charts   (including critical care time)  Medical Decision Making / ED Course   MDM:  80 year old female brought in as a code stroke for mental status change and weakness.  Patient with right-sided weakness.  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  Patient on Eliquis so TNK not given.  Patient taken straight to IR from CT scanner for intervention and will be admitted to neuro ICU for post intervention care.      Additional history obtained: -Additional history obtained from ems    Lab Tests: -I ordered, reviewed, and interpreted labs.   The pertinent results include:   Labs Reviewed  PROTIME-INR - Abnormal; Notable for the following components:      Result Value   Prothrombin Time 16.4 (*)    INR 1.3 (*)    All other components within normal limits  CBC - Abnormal; Notable for the following components:   RBC 3.85 (*)    Hemoglobin 11.6 (*)    HCT 34.1 (*)    All other components within normal limits  CBG MONITORING, ED - Abnormal; Notable for the following components:   Glucose-Capillary 105 (*)    All other components within normal limits  I-STAT CHEM 8,  ED - Abnormal; Notable for the following components:   Glucose, Bld 107 (*)    Hemoglobin 11.2 (*)    HCT 33.0 (*)    All other components within normal limits  APTT  DIFFERENTIAL  ETHANOL  COMPREHENSIVE METABOLIC PANEL  RAPID URINE DRUG SCREEN, HOSP PERFORMED  URINALYSIS, ROUTINE W REFLEX MICROSCOPIC    Notable for  mild elevated INR, mild anemia   Imaging Studies ordered: I ordered imaging studies including CTA head On my interpretation imaging demonstrates left MCA stroke I independently visualized and interpreted imaging. I agree with the radiologist interpretation   Medicines ordered and prescription drug management: Meds ordered this encounter  Medications   iohexol (OMNIPAQUE) 350 MG/ML injection 75 mL    stroke: early stages of recovery book   0.9 %  sodium chloride infusion   OR Linked Order Group    acetaminophen (TYLENOL) tablet 650 mg    acetaminophen (TYLENOL) 160 MG/5ML solution 650 mg    acetaminophen (TYLENOL) suppository 650 mg   senna-docusate (Senokot-S) tablet 1 tablet    -I have reviewed the patients home medicines and have made adjustments as needed   Consultations Obtained: I requested consultation with the neurologist,  and discussed lab and imaging findings as well as pertinent plan - they recommend: thrombectomy with IR   Social Determinants of Health:  Diagnosis or treatment significantly limited by social determinants of health: obesity   Co morbidities that complicate the patient evaluation  Past Medical History:  Diagnosis Date   Allergic rhinitis    CAD (coronary artery disease)    Chronic diastolic CHF (congestive heart failure) (HCC)    Chronic kidney disease, stage 3b (HCC)    Complex regional pain syndrome i of unspecified lower limb    Dizziness and giddiness    Essential (primary) hypertension    History of hiatal hernia    Hyperlipidemia    Hypothyroidism    LBBB (left bundle branch block)    Localized edema    Paroxysmal atrial fibrillation (HCC)    PONV (postoperative nausea and vomiting)    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 Excell Seltzer & Dr. Laneta Simmers   Severe aortic stenosis    Sinus bradycardia    Stroke (cerebrum) (HCC)        Dispostion: Disposition decision including need for hospitalization was considered, and patient admitted to the hospital.    Final Clinical Impression(s) / ED Diagnoses Final diagnoses:  Acute ischemic stroke [facility])     This chart was dictated using voice recognition software.  Despite best efforts to proofread,  errors can occur which can change the documentation meaning.    Lonell Grandchild, MD 06/04/22 [phone]

## 2022-06-04 NOTE — Progress Notes (Signed)
PT Cancellation Note  Patient Details Name: Rebekah Pope MRN: [id number] DOB: 1940-11-23   Cancelled Treatment:    Reason Eval/Treat Not Completed: Medical issues which prohibited therapy.  Sedated/vented, HR in the 30's and 40's. 06/04/2022  Jacinto Halim., PT Acute Rehabilitation Services [phone]  (office)   [name] 06/04/2022, 4:35 PM

## 2022-06-04 NOTE — Anesthesia Postprocedure Evaluation (Signed)
Anesthesia Post Note  Patient: Rebekah Pope  Procedure(s) Performed: IR WITH ANESTHESIA     Patient location during evaluation: ICU Anesthesia Type: General Level of consciousness: sedated and patient remains intubated per anesthesia plan Pain management: pain level controlled Vital Signs Assessment: post-procedure vital signs reviewed and stable Respiratory status: patient remains intubated per anesthesia plan and patient on ventilator - see flowsheet for VS Cardiovascular status: stable Anesthetic complications: no   No notable events documented.  Last Vitals:  Vitals:   06/04/22 1330 06/04/22 1345  BP: (!) 109/48   Pulse: (!) 38 (!) 43  Resp: 18 18  SpO2: 98% 99%    Last Pain: There were no vitals filed for this visit.               Rebekah Pope

## 2022-06-04 NOTE — Procedures (Signed)
INTERVENTIONAL NEURORADIOLOGY BRIEF POSTPROCEDURE NOTE  DIAGNOSTIC CEREBRAL ANGIOGRAM AND MECHANICAL THROMBECTOMY   Attending: Dr. Baldemar Lenis  Diagnosis: Left ICA terminus occlusion   Access site: Right common femoral artery   Access closure: Perclose pro style   Anesthesia: General anesthesia   Medication used: 2 g of Ancef IV.  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  Mechanical thrombectomy performed with direct contact aspiration (x1) and combined aspiration and stent retriever (x1) achieving complete recanalization (cT3).  No thromboembolic or hemorrhagic complication.   The patient was transferred to ICU in stable condition.   PLANS: Bed rest x6 hours post femoral puncture;  Patient remained intubated due to preprocedural mental status;  SBP 120-148 mmHg.

## 2022-06-04 NOTE — Progress Notes (Signed)
  Transition of Care [facility]) Screening Note   Patient Details  Name: Rebekah Pope Date of Birth: 09/06/1940   Transition of Care [facility]) CM/SW Contact:    Glennon Mac, RN Phone Number: 06/04/2022, 5:17 PM    Transition of Care Department [facility]) has reviewed patient and no TOC needs have been identified at this time. We will continue to monitor patient advancement through interdisciplinary progression rounds. If new patient transition needs arise, please place a TOC consult.  Quintella Baton, RN, BSN  Trauma/Neuro ICU Case Manager [phone]

## 2022-06-04 NOTE — Sedation Documentation (Signed)
Right groin sheath removed, Perclose closure device deployed to groin.

## 2022-06-04 NOTE — Code Documentation (Signed)
Rebekah Pope is an 81 yr old female arriving to [facility] via EMS on 06/04/2022 with a PMH of prior CVA, TAVR, AF on Eliquis. Pt is from home where she was LKW when she went to the bathroom at 0730 this morning. Right after that her husband said she became altered and he called 911. She is now c/o left gaze, rt weakness and aphasia.     Stroke Team at bridge upon pt arrival. Labs and CBG drawn, airway cleared by EDP. Pt to CT with team. NIHSS 25. Pt with Left gaze, rt weakness and is mute. (Please see documentation for times and NIHSS details).      The following imaging was performed: CT, CTA. Per Dr Wilford Corner, CT neg for hemorrhage. CTA positive for Left ICA and MCA occlusion. Informed consent for mechanical thrombectomy obtained from pt's  daughter. Pt taken to IR at 0900. Bedside report given to IR staff. [facility] ICU notified of pt. Pt is not a thrombolytic candidate due to eliquis.

## 2022-06-04 NOTE — Transfer of Care (Signed)
Immediate Anesthesia Transfer of Care Note  Patient: Rebekah Pope  Procedure(s) Performed: IR WITH ANESTHESIA  Patient Location: ICU  Anesthesia Type:General  Level of Consciousness: sedated, unresponsive, and Patient remains intubated per anesthesia plan  Airway & Oxygen Therapy: Patient remains intubated per anesthesia plan and Patient placed on Ventilator (see vital sign flow sheet for setting)  Post-op Assessment: Report given to RN and Post -op Vital signs reviewed and stable  Post vital signs: Reviewed and stable  Last Vitals:  Vitals Value Taken Time  BP 131/65 06/04/22 1021  Temp    Pulse 49 06/04/22 1029  Resp 14 06/04/22 1029  SpO2 100 % 06/04/22 1029  Vitals shown include unvalidated device data.  Last Pain: There were no vitals filed for this visit.       Complications: No notable events documented.

## 2022-06-05 ENCOUNTER — Institutional Professional Consult (permissible substitution): Payer: Medicare PPO | Admitting: Internal Medicine

## 2022-06-05 ENCOUNTER — Other Ambulatory Visit: Payer: Self-pay | Admitting: Radiology

## 2022-06-05 ENCOUNTER — Telehealth: Payer: Self-pay | Admitting: Cardiology

## 2022-06-05 ENCOUNTER — Inpatient Hospital Stay (HOSPITAL_COMMUNITY): Payer: Medicare PPO

## 2022-06-05 ENCOUNTER — Encounter (HOSPITAL_COMMUNITY): Payer: Self-pay | Admitting: Radiology

## 2022-06-05 DIAGNOSIS — R001 Bradycardia, unspecified: Secondary | ICD-10-CM

## 2022-06-05 DIAGNOSIS — Z8673 Personal history of transient ischemic attack (TIA), and cerebral infarction without residual deficits: Secondary | ICD-10-CM | POA: Diagnosis not present

## 2022-06-05 DIAGNOSIS — I480 Paroxysmal atrial fibrillation: Secondary | ICD-10-CM

## 2022-06-05 DIAGNOSIS — I639 Cerebral infarction, unspecified: Secondary | ICD-10-CM | POA: Diagnosis not present

## 2022-06-05 DIAGNOSIS — J9601 Acute respiratory failure with hypoxia: Secondary | ICD-10-CM | POA: Diagnosis not present

## 2022-06-05 LAB — CBC
HCT: 34 % — ABNORMAL LOW (ref 36.0–46.0)
Hemoglobin: 11.1 g/dL — ABNORMAL LOW (ref 12.0–15.0)
MCH: 29.1 pg (ref 26.0–34.0)
MCHC: 32.6 g/dL (ref 30.0–36.0)
MCV: 89.2 fL (ref 80.0–100.0)
Platelets: 174 10*3/uL (ref 150–400)
RBC: 3.81 MIL/uL — ABNORMAL LOW (ref 3.87–5.11)
RDW: 13.7 % (ref 11.5–15.5)
WBC: 10 10*3/uL (ref 4.0–10.5)
nRBC: 0 % (ref 0.0–0.2)

## 2022-06-05 LAB — GLUCOSE, CAPILLARY
Glucose-Capillary: 117 mg/dL — ABNORMAL HIGH (ref 70–99)
Glucose-Capillary: 95 mg/dL (ref 70–99)
Glucose-Capillary: 106 mg/dL — ABNORMAL HIGH (ref 70–99)
Glucose-Capillary: 88 mg/dL (ref 70–99)
Glucose-Capillary: 104 mg/dL — ABNORMAL HIGH (ref 70–99)
Glucose-Capillary: 97 mg/dL (ref 70–99)

## 2022-06-05 LAB — LIPID PANEL
Cholesterol: 131 mg/dL (ref 0–200)
HDL: 65 mg/dL (ref >40)
LDL Cholesterol: 51 mg/dL (ref 0–99)
Total CHOL/HDL Ratio: 2 RATIO
Triglycerides: 74 mg/dL (ref <150)
VLDL: 15 mg/dL (ref 0–40)

## 2022-06-05 LAB — BASIC METABOLIC PANEL
Anion gap: 9 (ref 5–15)
BUN: 21 mg/dL (ref 8–23)
CO2: 22 mmol/L (ref 22–32)
Calcium: 8.5 mg/dL — ABNORMAL LOW (ref 8.9–10.3)
Chloride: 110 mmol/L (ref 98–111)
Creatinine, Ser: 0.98 mg/dL (ref 0.44–1.00)
GFR, Estimated: 57 mL/min — ABNORMAL LOW (ref >60)
Glucose, Bld: 98 mg/dL (ref 70–99)
Potassium: 3.6 mmol/L (ref 3.5–5.1)
Sodium: 141 mmol/L (ref 135–145)

## 2022-06-05 LAB — PROTIME-INR
INR: 1.2 (ref 0.8–1.2)
Prothrombin Time: 15.5 seconds — ABNORMAL HIGH (ref 11.4–15.2)

## 2022-06-05 LAB — MAGNESIUM: Magnesium: 2 mg/dL (ref 1.7–2.4)

## 2022-06-05 LAB — APTT: aPTT: 25 seconds (ref 24–36)

## 2022-06-05 MED ORDER — CLEVIDIPINE BUTYRATE 0.5 MG/ML IV EMUL
1.0000 mg/h | INTRAVENOUS | Status: DC
  Administered 2022-06-05 – 2022-06-06 (×3): 1 mg/h via INTRAVENOUS
  Filled 2022-06-05 (×3): qty 50

## 2022-06-05 MED ORDER — POTASSIUM CHLORIDE 20 MEQ PO PACK
40.0000 meq | PACK | Freq: Once | ORAL | Status: AC
  Administered 2022-06-05: 40 meq
  Filled 2022-06-05: qty 2

## 2022-06-05 MED ORDER — AMIODARONE HCL 200 MG PO TABS
100.0000 mg | ORAL_TABLET | Freq: Every day | ORAL | Status: DC
  Administered 2022-06-05 – 2022-06-11 (×7): 100 mg via ORAL
  Filled 2022-06-05 (×7): qty 1

## 2022-06-05 MED ORDER — SODIUM CHLORIDE 0.9 % IV BOLUS
500.0000 mL | Freq: Once | INTRAVENOUS | Status: AC
  Administered 2022-06-05: 500 mL via INTRAVENOUS

## 2022-06-05 MED ORDER — ASPIRIN 325 MG PO TBEC
325.0000 mg | DELAYED_RELEASE_TABLET | Freq: Every day | ORAL | Status: DC
  Administered 2022-06-05 – 2022-06-06 (×2): 325 mg via ORAL
  Filled 2022-06-05 (×2): qty 1

## 2022-06-05 MED ORDER — ORAL CARE MOUTH RINSE: 15.0000 mL | OROMUCOSAL | Status: DC | PRN

## 2022-06-05 MED ORDER — ENOXAPARIN SODIUM 40 MG/0.4ML IJ SOSY
40.0000 mg | PREFILLED_SYRINGE | INTRAMUSCULAR | Status: DC
  Administered 2022-06-05: 40 mg via SUBCUTANEOUS
  Filled 2022-06-05: qty 0.4

## 2022-06-05 NOTE — Evaluation (Signed)
Physical Therapy Evaluation Patient Details Name: Rebekah Pope MRN: [id number] DOB: [birth year] Today's Date: 06/05/2022  History of Present Illness  81 y.o. female presents to [facility] hospital on 06/04/2022 with sudden onset unresponsiveness. CTA revealed occlusion of L ICA. Pt underwent IR thrombectomy on 12/28, remaining intubated post-procedure. PMHx: complex regional pain syndrome, essential HTN, HLD, hypothyroidism, a fib, TVAR, embolic strokes.  Clinical Impression  Pt presents to PT with deficits in functional mobility, strength, power, communication, balance, gait. Pt currently demonstrates significant weakness with greater R sided weakness than left. Pt demonstrates qualities of expressive>receptive aphasias, but follows commands well with increased time. Pt is able to stand during session but demonstrates a posterior lean with incomplete hip and trunk extension. Pt is at a high risk for falls due to weakness and imbalance. PT recommends AIR consult.       Recommendations for follow up therapy are one component of a multi-disciplinary discharge planning process, led by the attending physician.  Recommendations may be updated based on patient status, additional functional criteria and insurance authorization.  Follow Up Recommendations Acute inpatient rehab (3hours/day)      Assistance Recommended at Discharge Frequent or constant Supervision/Assistance  Patient can return home with the following  Two people to help with walking and/or transfers;Two people to help with bathing/dressing/bathroom;Assistance with cooking/housework;Assistance with feeding;Direct supervision/assist for medications management;Assist for transportation;Help with stairs or ramp for entrance;Direct supervision/assist for financial management    Equipment Recommendations Wheelchair (measurements PT);BSC/3in1  Recommendations for Other Services  Rehab consult    Functional Status Assessment Patient has had a  recent decline in their functional status and demonstrates the ability to make significant improvements in function in a reasonable and predictable amount of time.     Precautions / Restrictions Precautions Precautions: Fall;Other (comment) Precaution Comments: mild diplopia with impaired oculomotor function Restrictions Weight Bearing Restrictions: No      Mobility  Bed Mobility Overal bed mobility: Needs Assistance Bed Mobility: Supine to Sit, Sit to Supine     Supine to sit: Mod assist, +2 for physical assistance, HOB elevated Sit to supine: Mod assist, +2 for physical assistance        Transfers Overall transfer level: Needs assistance Equipment used: 2 person hand held assist Transfers: Sit to/from Stand Sit to Stand: Mod assist, +2 physical assistance, From elevated surface                Ambulation/Gait                  Stairs            Wheelchair Mobility    Modified Rankin (Stroke Patients Only) Modified Rankin (Stroke Patients Only) Pre-Morbid Rankin Score: Moderate disability Modified Rankin: Moderately severe disability     Balance Overall balance assessment: Needs assistance Sitting-balance support: No upper extremity supported, Feet supported Sitting balance-Leahy Scale: Fair     Standing balance support: Bilateral upper extremity supported, Reliant on assistive device for balance Standing balance-Leahy Scale: Poor Standing balance comment: modA x2                             Pertinent Vitals/Pain Pain Assessment Pain Assessment: No/denies pain    Home Living Family/patient expects to be discharged to:: Private residence Living Arrangements: Spouse/significant other Available Help at Discharge: Family;Available 24 hours/day Type of Home: House Home Access: Stairs to enter Entrance Stairs-Rails: None Entrance Stairs-Number of Steps: 1   Home Layout:  One level;Laundry or work area in [employer]  Equipment: Agricultural consultant (2 wheels);Cane - single point Additional Comments: live wtih husband, son is 2 miles away    Prior Function Prior Level of Function : Independent/Modified Independent             Mobility Comments: ambulatory with RW, continued to have intermittent dizzy spells ADLs Comments: performing ADLs independently although not getting into bathtub     Hand Dominance   Dominant Hand: Left    Extremity/Trunk Assessment   Upper Extremity Assessment Upper Extremity Assessment: Defer to OT evaluation    Lower Extremity Assessment Lower Extremity Assessment: RLE deficits/detail;LLE deficits/detail RLE Deficits / Details: grossly 3+/5 LLE Deficits / Details: grossly 4-/5    Cervical / Trunk Assessment Cervical / Trunk Assessment: Kyphotic  Communication   Communication: Receptive difficulties;Expressive difficulties (expressive deficits are more significant than receptive)  Cognition Arousal/Alertness: Awake/alert Behavior During Therapy: Flat affect Overall Cognitive Status: Impaired/Different from baseline Area of Impairment: Following commands, Problem solving                       Following Commands: Follows one step commands with increased time     Problem Solving: Slow processing, Decreased initiation, Requires verbal cues, Difficulty sequencing          General Comments General comments (skin integrity, edema, etc.): VSS on 2L [place] initially, PT weans to room air during mobility with gradual desat to 93% during actvitiy. PT returns pt to 2L [place] at end of session    Exercises     Assessment/Plan    PT Assessment Patient needs continued PT services  PT Problem List Decreased strength;Decreased activity tolerance;Decreased balance;Decreased mobility;Cardiopulmonary status limiting activity;Decreased cognition       PT Treatment Interventions DME instruction;Gait training;Functional mobility training;Therapeutic activities;Balance  training;Neuromuscular re-education;Stair training;Patient/family education    PT Goals (Current goals can be found in the Care Plan section)  Acute Rehab PT Goals Patient Stated Goal: to improve mobility quality, return to walking PT Goal Formulation: With patient/family Time For Goal Achievement: 06/19/22 Potential to Achieve Goals: Fair    Frequency Min 4X/week     Co-evaluation PT/OT/SLP Co-Evaluation/Treatment: Yes Reason for Co-Treatment: Complexity of the patient's impairments (multi-system involvement);Necessary to address cognition/behavior during functional activity;For patient/therapist safety;To address functional/ADL transfers PT goals addressed during session: Mobility/safety with mobility;Balance;Strengthening/ROM         AM-PAC PT "6 Clicks" Mobility  Outcome Measure Help needed turning from your back to your side while in a flat bed without using bedrails?: A Lot Help needed moving from lying on your back to sitting on the side of a flat bed without using bedrails?: A Lot Help needed moving to and from a bed to a chair (including a wheelchair)?: A Lot Help needed standing up from a chair using your arms (e.g., wheelchair or bedside chair)?: Total Help needed to walk in hospital room?: Total Help needed climbing 3-5 steps with a railing? : Total 6 Click Score: 9    End of Session Equipment Utilized During Treatment: Oxygen Activity Tolerance: Patient tolerated treatment well Patient left: in bed;with call bell/phone within reach;with bed alarm set;with family/visitor present Nurse Communication: Mobility status;Need for lift equipment PT Visit Diagnosis: Other abnormalities of gait and mobility (R26.89);Other symptoms and signs involving the nervous system (R29.898);Muscle weakness (generalized) (M62.81)    Time: 2919-1660 PT Time Calculation (min) (ACUTE ONLY): 31 min   Charges:   PT Evaluation $PT Eval Moderate Complexity: 1  Mod          Arlyss Gandy,  PT, DPT Acute Rehabilitation Office [phone]   Arlyss Gandy 06/05/2022, 2:25 PM

## 2022-06-05 NOTE — Progress Notes (Signed)
NAME:  Rebekah Pope, MRN:  [id number], DOB:  11-Aug-1940, LOS: 1 ADMISSION DATE:  06/04/2022, CONSULTATION DATE:  12/28 REFERRING MD:  Wilford Corner, CHIEF COMPLAINT:  L ICA ischemic stroke   History of Present Illness:  81 y/o female admitted for acute left ICA stroke extending to M1/2 and posterior cerebral arteries, underwent mechanical thrombectomy in IR.  PCCM consulted for vent management.  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 Excell Seltzer & Dr. Laneta Simmers   Severe aortic stenosis    Sinus bradycardia    Stroke (cerebrum) (HCC)      Significant Hospital Events: Including procedures, antibiotic start and stop dates in addition to other pertinent events   12/28 admitted to [facility] ED L MCA ischemic stroke; went to IR for mechanical thrombectomy; intubated postop; PCCM consulted  12/29 MRI brain > few scattered punctate foci of new/acute early ischemia in multiple vascular terribories, supraclinoid L ICA and L MCA patent, prox bilaterl posterior cerebral arteries remain occluded  Interim History / Subjective:  MRI brain last night Weaning on vent this morning bradycardia Had some oliguria, given IV fluids  Objective   Blood pressure (!) 132/53, pulse (!) 41, temperature 97.7 F (36.5 C), temperature source Oral, resp. rate 18, weight 87.8 kg, SpO2 100 %.     Vent Mode: PSV;CPAP FiO2 (%):  [30 %-100 %] 40 % Set Rate:  [18 bmp] 18 bmp Vt Set:  [420 mL] 420 mL PEEP:  [5 cmH20] 5 cmH20 Pressure Support:  [5 cmH20] 5 cmH20 Plateau Pressure:  [13 cmH20-18 cmH20] 13 cmH20   Intake/Output Summary (Last 24 hours) at 06/05/2022 0810 Last data filed at 06/05/2022 0700 Gross per 24 hour  Intake 2442.78 ml  Output 800 ml  Net 1642.78 ml   Filed Weights   06/04/22 0800  Weight: 87.8 kg    Examination:  General:  In bed on vent HENT: NCAT ETT in place PULM: CTA B, vent supported breathing CV: RRR, no mgr GI: BS+, soft, nontender MSK: normal bulk and tone Neuro: awake, on vent nodding head to quesitons approproatel   Resolved Hospital Problem list     Assessment & Plan:  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    Plan: Start weaning attempts on vent this morning VAP prevention Secondary stroke prevention, BP goals per neurology/stroke service Glucose goal 140-180 Continue synthroid Continue statin Anticoagulation per neuro  Best Practice (right click and "Reselect all SmartList Selections" daily)   Diet/type: NPO DVT prophylaxis: SCD GI prophylaxis: H2B Lines: N/A Foley:  Yes, and it is no  longer needed Code Status:  full code Last date of multidisciplinary goals of care discussion [per primary]  Critical care time: 32 minutes     Heber Albion, MD [facility] Pager: [phone] Cell: [phone] After 7:00 pm call Elink  [phone]

## 2022-06-05 NOTE — Progress Notes (Signed)
[facility] ADULT ICU REPLACEMENT PROTOCOL   The patient does apply for the [facility] Adult ICU Electrolyte Replacment Protocol based on the criteria listed below:   1.Exclusion criteria: TCTS, ECMO, Dialysis, and Myasthenia Gravis patients 2. Is GFR >/= 30 ml/min? Yes.    Patient's GFR today is 57 3. Is SCr </= 2? Yes.   Patient's SCr is 0.98 mg/dL 4. Did SCr increase >/= 0.5 in 24 hours? No. 5.Pt's weight >40kg  Yes.   6. Abnormal electrolyte(s): K+3.6  7. Electrolytes replaced per protocol 8.  Call MD STAT for K+ </= 2.5, Phos </= 1, or Mag </= 1 Physician:  [facility] 06/05/2022 5:35 AM

## 2022-06-05 NOTE — Progress Notes (Signed)
eLink Physician-Brief Progress Note Patient Name: Rebekah Pope DOB: December 24, 1940 MRN: [id number]   Date of Service  06/05/2022  HPI/Events of Note  Oliguria, blood tinged respiratory secretions.  eICU Interventions  NS 500 ml fluid bolus over 2 hours, CBC, PT-INR, PTT.        Thomasene Lot [name] 06/05/2022, 2:17 AM

## 2022-06-05 NOTE — Progress Notes (Signed)
? ?  Inpatient Rehab Admissions Coordinator : ? ?Per therapy recommendations, patient was screened for CIR candidacy by [facility] RN MSN.  At this time patient appears to be a potential candidate for CIR. I will place a rehab consult per protocol for full assessment. Please call me with any questions. ? ?Anastasia Tompson RN MSN ?Admissions Coordinator ?[phone] ?  ?

## 2022-06-05 NOTE — Progress Notes (Addendum)
STROKE TEAM PROGRESS NOTE   INTERVAL HISTORY Her daughter  is at the bedside.  RN at the bedside. Propofol and fentanyl has been off, in anticipation of extubation today.  She has been extubated.   She is resting comfortably, she is awake and alert. Right gaze, crosses midline,  right hemianopia, PERRL. Can follow some commands, some difficulty with complex commands. Right  arm and right leg with drift, can lift antigravity  BP goal < 160.  Daughter requesting cardiology consult as patient had a followup appt with Dr. Caryl Comes planned for today. Recent stroke admission 3 weeks ago on Xarelto and was switched to Eliquis; now with another stroke; ? Eliquis failure?  May need to consider Pradaxa or Coumadin  Vitals:   06/05/22 0645 06/05/22 0700 06/05/22 0715 06/05/22 0740  BP: (!) 146/51 (!) 128/50 (!) 132/53   Pulse: (!) 44 (!) 43 (!) 42 (!) 41  Resp: _0 Temp:      TempSrc:      SpO2: 100% 100% 100%   Weight:       CBC:  Recent Labs  Lab 06/04/22 0835 06/04/22 0837 06/04/22 1146 06/05/22 0318  WBC 8.1  --   --  10.0  NEUTROABS 4.0  --   --   --   HGB 11.6*   < > 10.2* 11.1*  HCT 34.1*   < > 30.0* 34.0*  MCV 88.6  --   --  89.2  PLT 166  --   --  174   < > = values in this interval not displayed.   Basic Metabolic Panel:  Recent Labs  Lab 06/04/22 0835 06/04/22 0837 06/04/22 1146 06/05/22 0318  NA 141 141 140 141  K 3.7 3.7 3.4* 3.6  CL 109 107  --  110  CO2 24  --   --  22  GLUCOSE 108* 107*  --  98  BUN 20 22  --  21  CREATININE 0.91 0.90  --  0.98  CALCIUM 8.6*  --   --  8.5*  MG  --   --   --  2.0   Lipid Panel:  Recent Labs  Lab 06/05/22 0316  CHOL 131  TRIG 74  HDL 65  CHOLHDL 2.0  VLDL 15  LDLCALC 51   HgbA1c: No results for input(s): "HGBA1C" in the last 168 hours. Urine Drug Screen:  Recent Labs  Lab 06/04/22 1029  LABOPIA NONE DETECTED  COCAINSCRNUR NONE DETECTED  LABBENZ NONE DETECTED  AMPHETMU NONE DETECTED  THCU NONE DETECTED   LABBARB NONE DETECTED    Alcohol Level  Recent Labs  Lab 06/04/22 0835  ETH <10    IMAGING past 24 hours MR BRAIN WO CONTRAST  Result Date: 06/05/2022 CLINICAL DATA:  Stroke follow-up EXAM: MRI HEAD WITHOUT CONTRAST MRA HEAD WITHOUT CONTRAST TECHNIQUE: Multiplanar, multi-echo pulse sequences of the brain and surrounding structures were acquired without intravenous contrast. Angiographic images of the Circle of Willis were acquired using MRA technique without intravenous contrast. COMPARISON:  05/10/2022 brain MRI 06/04/2022 CTA head neck. FINDINGS: MRI HEAD FINDINGS Brain: Multiple punctate foci of abnormal diffusion restriction within both hemispheres, some of which are new since 05/10/2022 (right corona radiata, left caudate, left mesial temporal lobe). No large area of acute ischemia. No acute hemorrhage or other extra-axial collection. No chronic microhemorrhage. There is multifocal hyperintense T2-weighted signal within the periventricular and deep white matter. Mild generalized atrophy. Midline structures are normal. Old bilateral cerebellar and left  corona radiata small vessel infarcts. Vascular: Unchanged abnormal right vertebral artery flow void. The dominant left vertebral artery flow void remains normal. Normal ICA flow voids. Skull and upper cervical spine: Normal marrow signal. Sinuses/Orbits: Negative Other: None MRA HEAD FINDINGS POSTERIOR CIRCULATION: --Vertebral arteries: Left dominant.  Both are patent. --Inferior cerebellar arteries: Normal. --Basilar artery: Normal. --Superior cerebellar arteries: Normal. --Posterior cerebral arteries: Occluded proximally bilaterally. ANTERIOR CIRCULATION: --Intracranial internal carotid arteries: The supraclinoid left ICA is now patent. --Anterior cerebral arteries (ACA): Normal. --Middle cerebral arteries (MCA): Normal flow related enhancement bilaterally. Anatomic variants: None IMPRESSION: 1. A few scattered punctate foci of new acute/early  subacute ischemia within multiple vascular territories. Multiple other recent foci of subacute ischemia as seen on 05/10/2022. 2. The supraclinoid left ICA and the left MCA are now patent. 3. 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 Electronically Signed   By: Ulyses Jarred M.D.   On: 06/05/2022 02:00   MR ANGIO HEAD WO CONTRAST  Result Date: 06/05/2022 CLINICAL DATA:  Stroke follow-up EXAM: MRI HEAD WITHOUT CONTRAST MRA HEAD WITHOUT CONTRAST TECHNIQUE: Multiplanar, multi-echo pulse sequences of the brain and surrounding structures were acquired without intravenous contrast. Angiographic images of the Circle of Willis were acquired using MRA technique without intravenous contrast. COMPARISON:  05/10/2022 brain MRI 06/04/2022 CTA head neck. FINDINGS: MRI HEAD FINDINGS Brain: Multiple punctate foci of abnormal diffusion restriction within both hemispheres, some of which are new since 05/10/2022 (right corona radiata, left caudate, left mesial temporal lobe). No large area of acute ischemia. No acute hemorrhage or other extra-axial collection. No chronic microhemorrhage. There is multifocal hyperintense T2-weighted signal within the periventricular and deep white matter. Mild generalized atrophy. Midline structures are normal. Old bilateral cerebellar and left corona radiata small vessel infarcts. Vascular: Unchanged abnormal right vertebral artery flow void. The dominant left vertebral artery flow void remains normal. Normal ICA flow voids. Skull and upper cervical spine: Normal marrow signal. Sinuses/Orbits: Negative Other: None MRA HEAD FINDINGS POSTERIOR CIRCULATION: --Vertebral arteries: Left dominant.  Both are patent. --Inferior cerebellar arteries: Normal. --Basilar artery: Normal. --Superior cerebellar arteries: Normal. --Posterior cerebral arteries: Occluded proximally bilaterally. ANTERIOR CIRCULATION: --Intracranial internal carotid arteries: The supraclinoid left ICA  is now patent. --Anterior cerebral arteries (ACA): Normal. --Middle cerebral arteries (MCA): Normal flow related enhancement bilaterally. Anatomic variants: None IMPRESSION: 1. A few scattered punctate foci of new acute/early subacute ischemia within multiple vascular territories. Multiple other recent foci of subacute ischemia as seen on 05/10/2022. 2. The supraclinoid left ICA and the left MCA are now patent. 3. 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 Electronically Signed   By: Ulyses Jarred M.D.   On: 06/05/2022 02:00   ECHOCARDIOGRAM COMPLETE  Result Date: 06/04/2022    ECHOCARDIOGRAM REPORT   Patient Name:   Rebekah Pope Date of Exam: 06/04/2022 Medical Rec #:  [id number]            Height:       63.0 in Accession #:    [id number]           Weight:       193.6 lb Date of Birth:  [birth year]             BSA:          1.907 m Patient Age:    4 years             BP:           128/54 mmHg Patient Gender:  F                    HR:           40 bpm. Exam Location:  Inpatient Procedure: 2D Echo, Cardiac Doppler and Color Doppler Indications:    Stroke I63.9  History:        Patient has prior history of Echocardiogram examinations, most                 recent 05/11/2022. CHF, CAD, Stroke, Arrythmias:Atrial                 Fibrillation and Bradycardia; Risk Factors:Hypertension and                 Dyslipidemia. CKD, stage III.                  Aortic Valve: valve is present in the aortic position. Procedure                 Date: 02/10/22.  Sonographer:    Ronny Flurry Referring Phys: [id number] ASHISH ARORA IMPRESSIONS  1. Left ventricular ejection fraction, by estimation, is 60 to 65%. The left ventricle has normal function. The left ventricle has no regional wall motion abnormalities. There is moderate concentric left ventricular hypertrophy. Left ventricular diastolic function could not be evaluated.  2. Right ventricular systolic function is normal. The right ventricular  size is normal. There is normal pulmonary artery systolic pressure.  3. The mitral valve is degenerative. Mild to moderate mitral valve regurgitation. No evidence of mitral stenosis. Moderate to severe mitral annular calcification.  4. Tricuspid valve regurgitation is mild to moderate.  5. S/p TAVR (45m Medtronic CoreValue-Evolut pro) with well-seated bioprosthetic valve. Aortic valve regurgitation is not visualized. No aortic stenosis is present. There is a valve present in the aortic position. Procedure Date: 02/10/22.  6. The inferior vena cava is dilated in size with <50% respiratory variability, suggesting right atrial pressure of 15 mmHg. FINDINGS  Left Ventricle: Left ventricular ejection fraction, by estimation, is 60 to 65%. The left ventricle has normal function. The left ventricle has no regional wall motion abnormalities. The left ventricular internal cavity size was normal in size. There is  moderate concentric left ventricular hypertrophy. Abnormal (paradoxical) septal motion, consistent with left bundle branch block. Left ventricular diastolic function could not be evaluated due to mitral annular calcification (moderate or greater). Left ventricular diastolic function could not be evaluated. Right Ventricle: The right ventricular size is normal. No increase in right ventricular wall thickness. Right ventricular systolic function is normal. There is normal pulmonary artery systolic pressure. The tricuspid regurgitant velocity is 2.26 m/s, and  with an assumed right atrial pressure of 15 mmHg, the estimated right ventricular systolic pressure is 346.9mmHg. Left Atrium: Left atrial size was normal in size. Right Atrium: Right atrial size was normal in size. Pericardium: There is no evidence of pericardial effusion. Presence of epicardial fat layer. Mitral Valve: The mitral valve is degenerative in appearance. Moderate to severe mitral annular calcification. Mild to moderate mitral valve regurgitation. No  evidence of mitral valve stenosis. Tricuspid Valve: The tricuspid valve is normal in structure. Tricuspid valve regurgitation is mild to moderate. No evidence of tricuspid stenosis. Aortic Valve: S/p TAVR (215mMedtronic CoreValue-Evolut pro) with well-seated bioprosthetic valve. Aortic valve regurgitation is not visualized. No aortic stenosis is present. Aortic valve mean gradient measures 7.0 mmHg. Aortic valve peak gradient measures 15.8 mmHg. Aortic valve area, by  VTI measures 1.81 cm. There is a valve present in the aortic position. Procedure Date: 02/10/22. Pulmonic Valve: The pulmonic valve was normal in structure. Pulmonic valve regurgitation is not visualized. No evidence of pulmonic stenosis. Aorta: The aortic root is normal in size and structure. Venous: The inferior vena cava is dilated in size with less than 50% respiratory variability, suggesting right atrial pressure of 15 mmHg. IAS/Shunts: No atrial level shunt detected by color flow Doppler.  LEFT VENTRICLE PLAX 2D LVIDd:         4.30 cm   Diastology LVIDs:         2.50 cm   LV e' medial:    4.32 cm/s LV PW:         1.70 cm   LV E/e' medial:  25.2 LV IVS:        1.30 cm   LV e' lateral:   5.54 cm/s LVOT diam:     1.70 cm   LV E/e' lateral: 19.7 LV SV:         101 LV SV Index:   53 LVOT Area:     2.27 cm  RIGHT VENTRICLE             IVC RV S prime:     16.00 cm/s  IVC diam: 2.50 cm TAPSE (M-mode): 2.7 cm LEFT ATRIUM             Index        RIGHT ATRIUM           Index LA diam:        3.40 cm 1.78 cm/m   RA Area:     17.00 cm LA Vol (A2C):   39.4 ml 20.66 ml/m  RA Volume:   43.50 ml  22.81 ml/m LA Vol (A4C):   56.1 ml 29.42 ml/m LA Biplane Vol: 50.4 ml 26.43 ml/m  AORTIC VALVE AV Area (Vmax):    1.75 cm AV Area (Vmean):   2.01 cm AV Area (VTI):     1.81 cm AV Vmax:           199.00 cm/s AV Vmean:          119.000 cm/s AV VTI:            0.557 m AV Peak Grad:      15.8 mmHg AV Mean Grad:      7.0 mmHg LVOT Vmax:         153.50 cm/s LVOT  Vmean:        105.500 cm/s LVOT VTI:          0.444 m LVOT/AV VTI ratio: 0.80  AORTA Ao Root diam: 2.20 cm Ao Asc diam:  2.80 cm MITRAL VALVE                TRICUSPID VALVE MV Area (PHT): 2.69 cm     TR Peak grad:   20.4 mmHg MV Decel Time: 282 msec     TR Vmax:        226.00 cm/s MV E velocity: 109.00 cm/s MV A velocity: 118.50 cm/s  SHUNTS MV E/A ratio:  0.92         Systemic VTI:  0.44 m                             Systemic Diam: 1.70 cm Kardie Tobb DO Electronically signed by Berniece Salines DO Signature Date/Time: 06/04/2022/2:31:40 PM    Final  IR PERCUTANEOUS ART THROMBECTOMY/INFUSION INTRACRANIAL INC DIAG ANGIO  Result Date: 06/04/2022 INDICATION: 81 year old female with past medical history significant for recent embolic strokes, sick sinus syndrome, paroxysmal A-fib on anticoagulation with a DOAC, hypertension, hyperlipidemia, obstructive CAD, HFpEF, 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 She was brought to ED after being found unresponsive; last known well 7 a.m. on 06/04/2022. Upon evaluation, leftward gaze, right-sided weakness and aphasia were noted; NIHSS. Head CT showed no large acute infarct or hemorrhage. CT angiogram of the head and neck showed a left ICA terminus occlusion. 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 She was brought to our service for emergency mechanical thrombectomy. EXAM: ULTRASOUND-GUIDED VASCULAR ACCESS DIAGNOSTIC CEREBRAL ANGIOGRAM MECHANICAL THROMBECTOMY FLAT PANEL HEAD CT COMPARISON:  CT/CT angiogram of the head and neck June 04, 2022. MEDICATIONS: 2 g of Ancef IV. The antibiotic was administered within 1 hour of the procedure ANESTHESIA/SEDATION: The procedure was performed under general anesthesia. CONTRAST:  50 mL of Omnipaque 300 milligram/mL FLUOROSCOPY: Radiation Exposure Index (as provided by the fluoroscopic device): 213 mGy Kerma COMPLICATIONS: None immediate. TECHNIQUE:  Informed written consent was obtained from the patient's daughter after a thorough discussion of the procedural risks, benefits and alternatives. All questions were addressed. Maximal Sterile Barrier Technique was utilized including caps, mask, sterile gowns, sterile gloves, sterile drape, hand hygiene and skin antiseptic. A timeout was performed prior to the initiation of the procedure. The right groin was prepped and draped in the usual sterile fashion. Using a micropuncture kit and the modified Seldinger technique, access was gained to the right common femoral artery and an 8 French sheath was placed. Real-time ultrasound guidance was utilized for vascular access including the acquisition of a permanent ultrasound image documenting patency of the accessed vessel. Under fluoroscopy, a Zoom 88 guide catheter was navigated over a 6 [place] VTK catheter and a 0.035" Terumo Glidewire into the aortic arch. The catheter was placed into the left common carotid artery and then advanced into the left internal carotid artery. The diagnostic catheter was removed. Frontal and lateral angiograms of the head were obtained. Mechanical thrombectomy was then carried out as described below. After mechanical thrombectomy, a 5 [place] Berenstein 2 catheter was navigated over a Terumo Glidewire into the aortic arch. The catheter was placed into the left subclavian artery and then advanced into the left vertebral artery. Frontal and lateral angiograms of the head obtained. The catheter was subsequently withdrawn. Right common femoral artery angiogram was obtained in right anterior oblique view. The puncture is at the level of the common femoral artery. The artery has normal caliber, adequate for closure device. The sheath was exchanged over the wire for a Perclose prostyle which was utilized for access closure. Immediate hemostasis was achieved. FINDINGS: 1. Normal caliber of the right common femoral artery, likely for vascular access.  2. Atherosclerotic changes of the left carotid bifurcation without hemodynamically significant stenosis. 3. 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 4. Near occlusive and chronic appearing stenosis of the bilateral P1/PCA segments. PROCEDURE: Using biplane roadmap guidance, a zoom 71 aspiration catheter was navigated over Colossus 35 microguidewire into the cavernous segment of the left ICA. The aspiration catheter was then advanced to the level of occlusion and connected to an aspiration pump. Continuous aspiration was performed for 2 minutes. The guide catheter was connected to a VacLok syringe. The aspiration catheter was subsequently removed under constant aspiration. The guide catheter was  aspirated for debris. 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 Using biplane roadmap guidance, a zoom 71 aspiration catheter was navigated over a phenom 21 microcatheter and an Aristotle 14 microguidewire into the cavernous segment of the left ICA. The microcatheter was then navigated over the wire into the left M2/MCA posterior division branch. Then, a 4 x 40 mm solitaire stent retriever was deployed spanning the ICA terminus, M1 and proximal M2 segments. The device was allowed to intercalated with the clot for 4 minutes. The microcatheter was removed. The aspiration catheter was advanced to the level of occlusion and connected to an aspiration pump. The thrombectomy device and aspiration catheter were removed under constant aspiration. The guide catheter was aspirated for debris. Left internal carotid artery angiograms with frontal lateral views of the head showed complete recanalization of the left ICA, MCA/ACA vascular trees. Flat panel CT of the head was obtained and post processed in a separate workstation with concurrent attending physician  supervision. Selected images were sent to PACS. No evidence of hemorrhagic complication. IMPRESSION: Successful mechanical thrombectomy for treatment of a left ICA terminus occlusion with complete recanalization at the 2 passes (TICI 3). PLAN: Patient transferred to ICU for continued post stroke care. Electronically Signed   By: Pedro Earls M.D.   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 She was brought to ED after being found unresponsive; last known well 7 a.m. on 06/04/2022. Upon evaluation, leftward gaze, right-sided weakness and aphasia were noted; NIHSS. Head CT showed no large acute infarct or hemorrhage. CT angiogram of the head and neck showed a left ICA terminus occlusion. 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 She was brought to our service for emergency mechanical thrombectomy. EXAM: ULTRASOUND-GUIDED VASCULAR ACCESS DIAGNOSTIC CEREBRAL ANGIOGRAM MECHANICAL THROMBECTOMY FLAT PANEL HEAD CT COMPARISON:  CT/CT angiogram of the head and neck June 04, 2022. MEDICATIONS: 2 g of Ancef IV. The antibiotic was administered within 1 hour of the procedure ANESTHESIA/SEDATION: The procedure was performed under general anesthesia. CONTRAST:  50 mL of Omnipaque 300 milligram/mL FLUOROSCOPY: Radiation Exposure Index (as provided by the fluoroscopic device): 458 mGy Kerma COMPLICATIONS: None immediate. TECHNIQUE: Informed written consent was obtained from the patient's daughter after a thorough discussion of the procedural risks, benefits and alternatives. All questions were addressed. Maximal Sterile Barrier  Technique was utilized including caps, mask, sterile gowns, sterile gloves, sterile drape, hand hygiene and skin antiseptic. A timeout was performed prior to the initiation of the procedure. The right groin was prepped and draped in the usual sterile fashion. Using a micropuncture kit and the modified Seldinger technique, access was gained to the right common femoral artery and an 8 French sheath was placed. Real-time ultrasound guidance was utilized for vascular access including the acquisition of a permanent ultrasound image documenting patency of the accessed vessel. Under fluoroscopy, a Zoom 88 guide catheter was navigated over a 6 [place] VTK catheter and a 0.035" Terumo Glidewire into the aortic arch. The catheter was placed into the left common carotid artery and then advanced into the left  internal carotid artery. The diagnostic catheter was removed. Frontal and lateral angiograms of the head were obtained. Mechanical thrombectomy was then carried out as described below. After mechanical thrombectomy, a 5 [place] Berenstein 2 catheter was navigated over a Terumo Glidewire into the aortic arch. The catheter was placed into the left subclavian artery and then advanced into the left vertebral artery. Frontal and lateral angiograms of the head obtained. The catheter was subsequently withdrawn. Right common femoral artery angiogram was obtained in right anterior oblique view. The puncture is at the level of the common femoral artery. The artery has normal caliber, adequate for closure device. The sheath was exchanged over the wire for a Perclose prostyle which was utilized for access closure. Immediate hemostasis was achieved. FINDINGS: 1. Normal caliber of the right common femoral artery, likely for vascular access. 2. Atherosclerotic changes of the left carotid bifurcation without hemodynamically significant stenosis. 3. 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 4. Near occlusive and  chronic appearing stenosis of the bilateral P1/PCA segments. PROCEDURE: Using biplane roadmap guidance, a zoom 71 aspiration catheter was navigated over Colossus 35 microguidewire into the cavernous segment of the left ICA. The aspiration catheter was then advanced to the level of occlusion and connected to an aspiration pump. Continuous aspiration was performed for 2 minutes. The guide catheter was connected to a VacLok syringe. The aspiration catheter was subsequently removed under constant aspiration. The guide catheter was aspirated for debris. 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 Using biplane roadmap guidance, a zoom 71 aspiration catheter was navigated over a phenom 21 microcatheter and an Aristotle 14 microguidewire into the cavernous segment of the left ICA. The microcatheter was then navigated over the wire into the left M2/MCA posterior division branch. Then, a 4 x 40 mm solitaire stent retriever was deployed spanning the ICA terminus, M1 and proximal M2 segments. The device was allowed to intercalated with the clot for 4 minutes. The microcatheter was removed. The aspiration catheter was advanced to the level of occlusion and connected to an aspiration pump. The thrombectomy device and aspiration catheter were removed under constant aspiration. The guide catheter was aspirated for debris. Left internal carotid artery angiograms with frontal lateral views of the head showed complete recanalization of the left ICA, MCA/ACA vascular trees. Flat panel CT of the head was obtained and post processed in a separate workstation with concurrent attending physician supervision. Selected images were sent to PACS. No evidence of hemorrhagic complication. IMPRESSION: Successful mechanical thrombectomy for treatment of a left ICA terminus occlusion with complete  recanalization at the 2 passes (TICI 3). PLAN: Patient transferred to ICU for continued post stroke care. Electronically Signed   By: Pedro Earls M.D.   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 She was brought to ED after being found unresponsive; last known well 7 a.m. on 06/04/2022. Upon evaluation, leftward gaze, right-sided weakness and aphasia were noted; NIHSS. Head CT showed no large acute infarct or hemorrhage. CT angiogram of the head and neck showed a left ICA terminus occlusion. Bilateral PCA occlusion is were  also seen, however, these are likely chronic. She was brought to our service for emergency mechanical thrombectomy. EXAM: ULTRASOUND-GUIDED VASCULAR ACCESS DIAGNOSTIC CEREBRAL ANGIOGRAM MECHANICAL THROMBECTOMY FLAT PANEL HEAD CT COMPARISON:  CT/CT angiogram of the head and neck June 04, 2022. MEDICATIONS: 2 g of Ancef IV. The antibiotic was administered within 1 hour of the procedure ANESTHESIA/SEDATION: The procedure was performed under general anesthesia. CONTRAST:  50 mL of Omnipaque 300 milligram/mL FLUOROSCOPY: Radiation Exposure Index (as provided by the fluoroscopic device): 828 mGy Kerma COMPLICATIONS: None immediate. TECHNIQUE: Informed written consent was obtained from the patient's daughter after a thorough discussion of the procedural risks, benefits and alternatives. All questions were addressed. Maximal Sterile Barrier Technique was utilized including caps, mask, sterile gowns, sterile gloves, sterile drape, hand hygiene and skin antiseptic. A timeout was performed prior to the initiation of the procedure. The right groin was  prepped and draped in the usual sterile fashion. Using a micropuncture kit and the modified Seldinger technique, access was gained to the right common femoral artery and an 8 French sheath was placed. Real-time ultrasound guidance was utilized for vascular access including the acquisition of a permanent ultrasound image documenting patency of the accessed vessel. Under fluoroscopy, a Zoom 88 guide catheter was navigated over a 6 [place] VTK catheter and a 0.035" Terumo Glidewire into the aortic arch. The catheter was placed into the left common carotid artery and then advanced into the left internal carotid artery. The diagnostic catheter was removed. Frontal and lateral angiograms of the head were obtained. Mechanical thrombectomy was then carried out as described below. After mechanical thrombectomy, a 5 [place] Berenstein 2 catheter was navigated over a Terumo Glidewire into the aortic arch. The catheter was placed into the left subclavian artery and then advanced into the left vertebral artery. Frontal and lateral angiograms of the head obtained. The catheter was subsequently withdrawn. Right common femoral artery angiogram was obtained in right anterior oblique view. The puncture is at the level of the common femoral artery. The artery has normal caliber, adequate for closure device. The sheath was exchanged over the wire for a Perclose prostyle which was utilized for access closure. Immediate hemostasis was achieved. FINDINGS: 1. Normal caliber of the right common femoral artery, likely for vascular access. 2. Atherosclerotic changes of the left carotid bifurcation without hemodynamically significant stenosis. 3. 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 4. Near occlusive and chronic appearing stenosis of the bilateral P1/PCA segments. PROCEDURE: Using biplane roadmap guidance, a zoom 71 aspiration catheter was navigated over Colossus 35 microguidewire into the cavernous segment of the  left ICA. The aspiration catheter was then advanced to the level of occlusion and connected to an aspiration pump. Continuous aspiration was performed for 2 minutes. The guide catheter was connected to a VacLok syringe. The aspiration catheter was subsequently removed under constant aspiration. The guide catheter was aspirated for debris. 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 Using biplane roadmap guidance, a zoom 71 aspiration catheter was navigated over a phenom 21 microcatheter and an Aristotle 14 microguidewire into the cavernous segment of the left ICA. The microcatheter was then navigated over the wire into the left M2/MCA posterior division branch. Then, a 4 x 40 mm solitaire stent retriever was deployed spanning the ICA terminus, M1 and proximal M2 segments. The device was allowed to intercalated with the clot  for 4 minutes. The microcatheter was removed. The aspiration catheter was advanced to the level of occlusion and connected to an aspiration pump. The thrombectomy device and aspiration catheter were removed under constant aspiration. The guide catheter was aspirated for debris. Left internal carotid artery angiograms with frontal lateral views of the head showed complete recanalization of the left ICA, MCA/ACA vascular trees. Flat panel CT of the head was obtained and post processed in a separate workstation with concurrent attending physician supervision. Selected images were sent to PACS. No evidence of hemorrhagic complication. IMPRESSION: Successful mechanical thrombectomy for treatment of a left ICA terminus occlusion with complete recanalization at the 2 passes (TICI 3). PLAN: Patient transferred to ICU for continued post stroke care. Electronically Signed   By: Pedro Earls M.D.   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 She was brought to ED after being found unresponsive; last known well 7 a.m. on 06/04/2022. Upon evaluation, leftward gaze, right-sided weakness and aphasia were noted; NIHSS. Head CT showed no large acute infarct or hemorrhage. CT angiogram of the head and neck showed a left ICA terminus occlusion. 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 She was brought to our service for emergency mechanical thrombectomy. EXAM: ULTRASOUND-GUIDED VASCULAR ACCESS DIAGNOSTIC CEREBRAL ANGIOGRAM MECHANICAL THROMBECTOMY FLAT PANEL HEAD CT COMPARISON:  CT/CT angiogram of the head and neck June 04, 2022. MEDICATIONS: 2 g of Ancef IV. The antibiotic was administered within 1 hour of the procedure ANESTHESIA/SEDATION: The procedure was performed under general anesthesia. CONTRAST:  50 mL of Omnipaque 300 milligram/mL FLUOROSCOPY: Radiation Exposure Index (as provided by the fluoroscopic device): 657 mGy Kerma COMPLICATIONS: None immediate. TECHNIQUE: Informed written consent was obtained from the patient's daughter after a thorough discussion of the procedural risks, benefits and alternatives. All questions were addressed. Maximal Sterile Barrier Technique was utilized including caps, mask, sterile gowns, sterile gloves, sterile drape, hand hygiene and skin antiseptic. A timeout was performed prior to the initiation of the procedure. The right groin was prepped and draped in the usual sterile fashion. Using a micropuncture kit and the modified Seldinger technique, access was gained to the right common femoral artery and an 8 French sheath was  placed. Real-time ultrasound guidance was utilized for vascular access including the acquisition of a permanent ultrasound image documenting patency of the accessed vessel. Under fluoroscopy, a Zoom 88 guide catheter was navigated over a 6 [place] VTK catheter and a 0.035" Terumo Glidewire into the aortic arch. The catheter was placed into the left common carotid artery and then advanced into the left internal carotid artery. The diagnostic catheter was removed. Frontal and lateral angiograms of the head were obtained. Mechanical thrombectomy was then carried out as described below. After mechanical thrombectomy, a 5 [place] Berenstein 2 catheter was navigated over a Terumo Glidewire into the aortic arch. The catheter was placed into the left subclavian artery and then advanced into the left vertebral artery. Frontal and lateral angiograms of the head obtained. The catheter was subsequently withdrawn. Right common femoral artery angiogram was obtained in right anterior oblique view. The puncture is at the level of the common femoral artery. The artery has normal caliber,  adequate for closure device. The sheath was exchanged over the wire for a Perclose prostyle which was utilized for access closure. Immediate hemostasis was achieved. FINDINGS: 1. Normal caliber of the right common femoral artery, likely for vascular access. 2. Atherosclerotic changes of the left carotid bifurcation without hemodynamically significant stenosis. 3. 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 4. Near occlusive and chronic appearing stenosis of the bilateral P1/PCA segments. PROCEDURE: Using biplane roadmap guidance, a zoom 71 aspiration catheter was navigated over Colossus 35 microguidewire into the cavernous segment of the left ICA. The aspiration catheter was then advanced to the level of occlusion and connected to an aspiration pump. Continuous aspiration was performed for 2 minutes. The guide catheter was  connected to a VacLok syringe. The aspiration catheter was subsequently removed under constant aspiration. The guide catheter was aspirated for debris. 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 Using biplane roadmap guidance, a zoom 71 aspiration catheter was navigated over a phenom 21 microcatheter and an Aristotle 14 microguidewire into the cavernous segment of the left ICA. The microcatheter was then navigated over the wire into the left M2/MCA posterior division branch. Then, a 4 x 40 mm solitaire stent retriever was deployed spanning the ICA terminus, M1 and proximal M2 segments. The device was allowed to intercalated with the clot for 4 minutes. The microcatheter was removed. The aspiration catheter was advanced to the level of occlusion and connected to an aspiration pump. The thrombectomy device and aspiration catheter were removed under constant aspiration. The guide catheter was aspirated for debris. Left internal carotid artery angiograms with frontal lateral views of the head showed complete recanalization of the left ICA, MCA/ACA vascular trees. Flat panel CT of the head was obtained and post processed in a separate workstation with concurrent attending physician supervision. Selected images were sent to PACS. No evidence of hemorrhagic complication. IMPRESSION: Successful mechanical thrombectomy for treatment of a left ICA terminus occlusion with complete recanalization at the 2 passes (TICI 3). PLAN: Patient transferred to ICU for continued post stroke care. Electronically Signed   By: Pedro Earls M.D.   On: 06/04/2022 12:19   DG CHEST PORT 1 VIEW  Result Date: 06/04/2022 CLINICAL DATA:  Endotracheal tube placement. EXAM: PORTABLE CHEST 1 VIEW COMPARISON:  May 10, 2022. FINDINGS: Stable cardiomegaly. Endotracheal and nasogastric  tubes are in grossly good position. Lungs are clear. Bony thorax is unremarkable. Status post transcatheter aortic valve repair. IMPRESSION: Endotracheal and nasogastric tubes are in grossly good position. Electronically Signed   By: Marijo Conception M.D.   On: 06/04/2022 11:01   DG Abd 1 View  Result Date: 06/04/2022 CLINICAL DATA:  Enteric tube placement EXAM: ABDOMEN - 1 VIEW COMPARISON:  None Available. FINDINGS: Enteric tube tip projects over the distal stomach. Endotracheal tube tip projects 1.5 cm above the carina. Status post aortic valve replacement. Mitral annular calcifications. Partially imaged lung bases appear clear. Excreted renal contrast within the collecting systems. Paucity of bowel gas in the partially imaged upper abdomen. IMPRESSION: Enteric tube tip projects over the distal stomach. Electronically Signed   By: Darrin Nipper M.D.   On: 06/04/2022 11:00   CT ANGIO HEAD NECK W WO CM (CODE STROKE)  Addendum Date: 06/04/2022   ADDENDUM REPORT: 06/04/2022 09:29 ADDENDUM: ASPECTS score on the initial noncontrast head CT is 10. Electronically  Signed   By: Valetta Mole M.D.   On: 06/04/2022 09:29   Result Date: 06/04/2022 CLINICAL DATA:  Code stroke.  Neuro deficit EXAM: CT ANGIOGRAPHY HEAD AND NECK TECHNIQUE: Multidetector CT imaging of the head and neck was performed using the standard protocol during bolus administration of intravenous contrast. Multiplanar CT image reconstructions and MIPs were obtained to evaluate the vascular anatomy. Carotid stenosis measurements (when applicable) are obtained utilizing NASCET criteria, using the distal internal carotid diameter as the denominator. RADIATION DOSE REDUCTION: This exam was performed according to the departmental dose-optimization program which includes automated exposure control, adjustment of the mA and/or kV according to patient size and/or use of iterative reconstruction technique. CONTRAST:  75 cc Omnipaque 350 COMPARISON:  Brain MRI  05/10/2022 FINDINGS: CT HEAD FINDINGS Image quality is degraded by motion artifact. Brain: There is no evidence of acute intracranial hemorrhage, extra-axial fluid collection, or acute territorial infarct. Parenchymal volume is stable. The ventricles are stable in size. Small remote infarcts in the bilateral corona radiata and cerebellar hemispheres are again seen. Additional patchy hypodensity in the supratentorial white matter likely reflects underlying chronic small vessel ischemic change. There is no mass lesion.  There is no mass effect or midline shift. Vascular: See below. Skull: Normal. Negative for fracture or focal lesion. Sinuses/Orbits: The paranasal sinuses are clear. The globes and orbits are unremarkable. Other: None. Review of the MIP images confirms the above findings CTA NECK FINDINGS Aortic arch: There is calcified plaque in the aortic arch. The origins of the major branch vessels are patent. The subclavian arteries are patent to the level imaged. Right carotid system: The right common, internal, and external carotid arteries are patent, with mild plaque of the bifurcation but no hemodynamically significant stenosis or occlusion. There is no dissection or aneurysm. Left carotid system: The left common, internal, and external carotid arteries are patent with mild plaque of the bifurcation but no hemodynamically significant stenosis or occlusion. There is no dissection or aneurysm. Vertebral arteries: There is calcified plaque of the vertebral artery origins without hemodynamically significant stenosis or occlusion. The left vertebral artery is dominant, a normal variant. There is no hemodynamically significant stenosis or occlusion. The vertebral arteries are patent, with no dissection or aneurysm. Skeleton: There is no acute osseous abnormality or suspicious osseous lesion. Other neck: The soft tissues of the neck are unremarkable. Upper chest: There is a right pleural effusion, incompletely  imaged. There is interlobular septal thickening which may reflect pulmonary interstitial edema. Review of the MIP images confirms the above findings CTA HEAD FINDINGS Anterior circulation: The left ICA is occluded at the supraclinoid segment extending to the M1 segment. There is reconstitution of flow in the distal M1 segment with further occlusion of M2 branches in the sylvian fissure (8-104). There is diminutive reconstitution of flow in more distal MCA branches. The right intracranial ICA is patent with calcified plaque but no hemodynamically significant stenosis or occlusion. The right MCA is patent, without proximal high-grade stenosis or occlusion. The bilateral ACAs are patent. There is focal high-grade stenosis of the left A3 segment (8-96). The left A1 segment is likely fills retrograde via the anterior communicating artery. There is no aneurysm or AVM. Posterior circulation: There is calcified plaque in the left V4 segment resulting in moderate stenosis. The non dominant right V4 segment is patent. The basilar artery is patent. The major cerebellar arteries are patent. Both PCAs appear occluded shortly after their origins with diminutive reconstitution of flow in distal vessels.  There is no aneurysm or AVM. Venous sinuses: As permitted by contrast timing, patent. Anatomic variants: None. Review of the MIP images confirms the above findings IMPRESSION: 1. No definite evidence of evolved left MCA infarct on motion degraded initial noncontrast head CT. 2. 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 There is diminutive reconstitution of flow in more distal MCA branches. 3. Occluded bilateral PCAs shortly after their origins with diminutive reconstitution of flow in distal vessels, favored chronic. 4. Focal high-grade stenosis of the left A3 segment. 5. Calcified plaque at the carotid bifurcations,  left worse than right, and the bilateral vertebral artery origins without hemodynamically significant stenosis. 6. Incompletely imaged right pleural effusion and mild pulmonary interstitial edema. Findings communicated to Dr. Rory Percy 8:53 am. Electronically Signed: By: Valetta Mole M.D. On: 06/04/2022 09:09    PHYSICAL EXAM  Temp:  [97.4 F (36.3 C)-98.4 F (36.9 C)] 98 F (36.7 C) (12/29 1200) Pulse Rate:  [31-59] 52 (12/29 1600) Resp:  [12-21] 19 (12/29 1600) BP: (100-176)/(38-99) 155/66 (12/29 1600) SpO2:  [88 %-100 %] 98 % (12/29 1600) Arterial Line BP: (102-130)/(33-48) 102/33 (12/28 1815) FiO2 (%):  [40 %] 40 % (12/29 0751)  General - Well nourished, well developed, in no apparent distress. Cardiovascular - Regular rhythm and rate.  Mental Status -  she is awake and alert. Intubated. Right gaze, crosses midline,  right hemianopia, PERRL. Can follow some commands, some difficulty with complex commands. Right  arm and right leg with drift, can lift antigravity  Sensory - decreased sensation  Coordination - The patient had normal movements in the hands and feet with no ataxia or dysmetria.  Tremor was absent. Gait and Station - deferred.  ASSESSMENT/PLAN [name] is a 81 y.o. 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 Last known well is about 7 AM when she woke up and went to the bathroom and upon coming back laid in bed and was noted to be unresponsive by the husband. To IR for emergent mechanical thrombectomy of left ICA terminus occlusion   Stroke: bilateral L>R acute scattered punctate infarcts due to left ICA and MCA occlusion s/p IR with TICI 3 revascularization, etiology likely Afib failed eliquis Code Stroke CT head  No definite evidence  of evolved left MCA infarct on motion degraded initial noncontrast head CT. ASPECTS 10.  CTA head & neck Occlusion of the left intracranial ICA at the supraclinoid segment extending to the M1 segment with further occlusion of M2 branches in the sylvian fissure. There is diminutive reconstitution of flow in more distal MCA branches. Occluded bilateral PCAs shortly after their origins with diminutive reconstitution of flow in distal vessels, favored chronic. Focal high-grade stenosis of the left A3 segment. MRI /MRA - A few scattered punctate foci of new acute/early subacute ischemia within multiple vascular territories. Multiple other recent foci of subacute ischemia as seen on 05/10/2022. The supraclinoid left ICA and the left MCA are now patent. 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 Post IR CT head No thromboembolic or hemorrhagic complication  2D Echo EF 60-65%  LDL 51 HgbA1c 6.0 VTE prophylaxis - Lovenox Eliquis prior to admission, now on aspirin 325 mg daily. OK to start anticoagulation in  am given small size of stroke, but will consider either pradaxa or coumadin given failure of Xarelto and eliquis recently. Family to decide the preference of regimen Therapy recommendations:  CIR Disposition:  pending  Sinus Bradycardia  PAF  S/p TAVR 02/10/22 CAD  HFpEF - Zio patch - No AFib has been detected; HRs 37-64bpm - On Eliquis at home, reports compliance with med  - Resume home amio  - Entresto on hold, restart when appropriate  - cardiology on board - failed Xarelto and Eliquis recently, family to decide preference on Pradaxa or coumadin in am  Recent stroke  05/12/2022 admitted for nausea vomiting, dizziness, weakness and diplopia.  MRI showed multifocal scattered embolic infarcts.  EF 65 to 70%.  LDL 176, A1c 6.0.  Xarelto switched to Eliquis and put on Lipitor 80 on discharge.  Hypertension Home meds:  norvasc 40m, hydralazine 25 mg   Stable Long-term BP goal normotensive  Hyperlipidemia Home meds:  atorvastatin 831m resumed in hospital LDL 51, goal < 70 Continue statin at discharge  Hypothyroidism  - Restart home synthroid   Other Stroke Risk Factors Advanced Age >/= 6561Obesity, Body mass index is 34.29 kg/m., BMI >/= 30 associated with increased stroke risk, recommend weight loss, diet and exercise as appropriate   Other Active Problems CKD, stage 3 Anemia -HGB 11.1. Monitor. Check in am   Hospital day # 1 [place], ACNPC-AG  [facility]  ATTENDING NOTE: I reviewed above note and agree with the assessment and plan. Pt was seen and examined.   [age]ear old female with history of SSS, PAF on Eliquis, hypertension, hyperlipidemia, CAD, CHF, status post TAVR in 02/2022, recent stroke admitted for unresponsiveness, left gaze and right-sided weakness.  CT no acute abnormality.  CTA head and neck showed left ICA and MCA occlusion, bilateral PCA chronic occlusion.  Send post IR with TICI3 reperfusion.  EF 60 to 65%.  LDL 51.  UDS negative.  Creatinine 0.90.  MRI showed scattered punctate bilateral, lateral border right, infarcts.  MRA showed patent left ICA and MCA.  Continue to have sinus bradycardia.  On exam, patient still intubated, however eyes open, nodding or shaking head to answer questions.  Left gaze preference, cross midline, right gaze incomplete.  Blinking to visual threat bilaterally.  Facial symmetry noted to assess due to ET tube.  Right upper extremity mild drift, left upper extremity no drift.  Left lower extremity 4/5 knee flexion, right lower extremity 3/5 knee flexion.  Sensation subjectively symmetrical.  Etiology for patient current stroke again likely due to A-fib failed anticoagulation.  Recent stroke changed from Xarelto to Eliquis.  Patient indicated compliance with Eliquis at home.  Current options for further anticoagulation including Pradaxa or Coumadin.  Patient  daughter will discuss with her husband who is a [place] will give us[place]areference tomorrow.  Currently on aspirin 325.  Continue Lipitor 80.  Extubate per CCM.  BP goal 120-140 within 24 hours post IR.  Still has bradycardia, cardiology on board.  No pacemaker needed.  PT/OT recommend CIR.  For detailed assessment and plan, please refer to above/below as I have made changes wherever appropriate.   This patient is critically ill due to stroke from left ICA and MCA occlusion status post thrombectomy, sinus bradycardia, A-fib and at significant risk of neurological worsening, death form recurrent stroke, hemorrhagic transformation, heart failure, cardiac arrest. This patient's care requires constant monitoring of vital signs, hemodynamics, respiratory and cardiac monitoring, review of  multiple databases, neurological assessment, discussion with family, other specialists and medical decision making of high complexity. I spent 45 minutes of neurocritical care time in the care of this patient. I had long discussion with daughter at bedside, updated pt current condition, treatment plan and potential prognosis, and answered all the questions.  She expressed understanding and appreciation.   Rosalin Hawking, MD PhD Stroke Neurology 06/05/2022 7:33 PM    To contact Stroke Continuity provider, please refer to [email or web address] After hours, contact General Neurology

## 2022-06-05 NOTE — Progress Notes (Signed)
Referring Physician(s): Code Stroke  Supervising Physician: Pedro Earls  Patient Status:  [facility] - In-pt  Chief Complaint: left ICA stroke s/p cerebral angiogram with direct contact aspiration (x1) and combined aspiration and stent retriever (x1) achieving with Dr. Raliegh Ip. Rebekah Pope   Subjective:  Unable to assess due to expressive aphasia  Allergies: Patient has no known allergies.  Medications: Prior to Admission medications   Medication Sig Start Date End Date Taking? Authorizing Provider  acetaminophen (TYLENOL) 325 MG tablet Take 2 tablets (650 mg total) by mouth every 6 (six) hours as needed for mild pain, fever or headache. 05/19/22  Yes Angiulli, Lavon Paganini, PA-C  amiodarone (PACERONE) 200 MG tablet Take 0.5 tablets (100 mg total) by mouth daily. 05/19/22  Yes Angiulli, Lavon Paganini, PA-C  amLODipine (NORVASC) 10 MG tablet Take 1 tablet (10 mg total) by mouth at bedtime. 05/19/22  Yes Angiulli, Lavon Paganini, PA-C  apixaban (ELIQUIS) 5 MG TABS tablet Take 1 tablet (5 mg total) by mouth 2 (two) times daily. 05/19/22  Yes Angiulli, Lavon Paganini, PA-C  atorvastatin (LIPITOR) 80 MG tablet Take 1 tablet (80 mg total) by mouth daily. 05/19/22  Yes Angiulli, Lavon Paganini, PA-C  ENTRESTO 24-26 MG Take 1 tablet by mouth 2 (two) times daily. 05/19/22  Yes Angiulli, Lavon Paganini, PA-C  hydrALAZINE (APRESOLINE) 25 MG tablet Take 1 tablet (25 mg total) by mouth 3 (three) times daily. 05/19/22  Yes Angiulli, Lavon Paganini, PA-C  levothyroxine (SYNTHROID) 112 MCG tablet Take 1 tablet (112 mcg total) by mouth daily before breakfast. 05/19/22  Yes Angiulli, Lavon Paganini, PA-C  oxyCODONE (OXY IR/ROXICODONE) 5 MG immediate release tablet Take 1 tablet (5 mg total) by mouth every 4 (four) hours as needed for moderate pain or severe pain. 05/19/22  Yes Angiulli, Lavon Paganini, PA-C     Vital Signs: BP (!) 131/45   Pulse (!) 45   Temp 98.4 F (36.9 C) (Axillary)   Resp 18   Wt 193 lb 9 oz (87.8 kg)    SpO2 99%   BMI 34.29 kg/m   Physical Exam Vitals and nursing note reviewed.  Constitutional:      Appearance: She is well-developed.  HENT:     Head: Normocephalic and atraumatic.  Eyes:     Conjunctiva/sclera: Conjunctivae normal.  Cardiovascular:     Rate and Rhythm: Regular rhythm. Bradycardia present.     Comments: right groin access site is soft with no active bleeding and no appreciable pseudoaneurysm. Dressing is C/D/I Pulmonary:     Effort: Pulmonary effort is normal.  Musculoskeletal:        General: Normal range of motion.     Cervical back: Normal range of motion.  Skin:    General: Skin is warm.  Neurological:     Mental Status: She is alert.     Comments: Alert, aware unable to assess orientation due to patient's aphasia No facial droop noted Tongue midline Can spontaneously move all 4 extremities. Right upper and lower extremity drift present Distal pulses (DP's) palpable bilaterally.  Gait not assessed Romberg not assessed Heel to toe not assessed         Imaging: MR BRAIN WO CONTRAST  Result Date: 06/05/2022 CLINICAL DATA:  Stroke follow-up EXAM: MRI HEAD WITHOUT CONTRAST MRA HEAD WITHOUT CONTRAST TECHNIQUE: Multiplanar, multi-echo pulse sequences of the brain and surrounding structures were acquired without intravenous contrast. Angiographic images of the Circle of Willis were acquired using MRA technique without intravenous contrast.  COMPARISON:  05/10/2022 brain MRI 06/04/2022 CTA head neck. FINDINGS: MRI HEAD FINDINGS Brain: Multiple punctate foci of abnormal diffusion restriction within both hemispheres, some of which are new since 05/10/2022 (right corona radiata, left caudate, left mesial temporal lobe). No large area of acute ischemia. No acute hemorrhage or other extra-axial collection. No chronic microhemorrhage. There is multifocal hyperintense T2-weighted signal within the periventricular and deep white matter. Mild generalized atrophy. Midline  structures are normal. Old bilateral cerebellar and left corona radiata small vessel infarcts. Vascular: Unchanged abnormal right vertebral artery flow void. The dominant left vertebral artery flow void remains normal. Normal ICA flow voids. Skull and upper cervical spine: Normal marrow signal. Sinuses/Orbits: Negative Other: None MRA HEAD FINDINGS POSTERIOR CIRCULATION: --Vertebral arteries: Left dominant.  Both are patent. --Inferior cerebellar arteries: Normal. --Basilar artery: Normal. --Superior cerebellar arteries: Normal. --Posterior cerebral arteries: Occluded proximally bilaterally. ANTERIOR CIRCULATION: --Intracranial internal carotid arteries: The supraclinoid left ICA is now patent. --Anterior cerebral arteries (ACA): Normal. --Middle cerebral arteries (MCA): Normal flow related enhancement bilaterally. Anatomic variants: None IMPRESSION: 1. A few scattered punctate foci of new acute/early subacute ischemia within multiple vascular territories. Multiple other recent foci of subacute ischemia as seen on 05/10/2022. 2. The supraclinoid left ICA and the left MCA are now patent. 3. 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 Electronically Signed   By: Ulyses Jarred M.D.   On: 06/05/2022 02:00   MR ANGIO HEAD WO CONTRAST  Result Date: 06/05/2022 CLINICAL DATA:  Stroke follow-up EXAM: MRI HEAD WITHOUT CONTRAST MRA HEAD WITHOUT CONTRAST TECHNIQUE: Multiplanar, multi-echo pulse sequences of the brain and surrounding structures were acquired without intravenous contrast. Angiographic images of the Circle of Willis were acquired using MRA technique without intravenous contrast. COMPARISON:  05/10/2022 brain MRI 06/04/2022 CTA head neck. FINDINGS: MRI HEAD FINDINGS Brain: Multiple punctate foci of abnormal diffusion restriction within both hemispheres, some of which are new since 05/10/2022 (right corona radiata, left caudate, left mesial temporal lobe). No large area of acute  ischemia. No acute hemorrhage or other extra-axial collection. No chronic microhemorrhage. There is multifocal hyperintense T2-weighted signal within the periventricular and deep white matter. Mild generalized atrophy. Midline structures are normal. Old bilateral cerebellar and left corona radiata small vessel infarcts. Vascular: Unchanged abnormal right vertebral artery flow void. The dominant left vertebral artery flow void remains normal. Normal ICA flow voids. Skull and upper cervical spine: Normal marrow signal. Sinuses/Orbits: Negative Other: None MRA HEAD FINDINGS POSTERIOR CIRCULATION: --Vertebral arteries: Left dominant.  Both are patent. --Inferior cerebellar arteries: Normal. --Basilar artery: Normal. --Superior cerebellar arteries: Normal. --Posterior cerebral arteries: Occluded proximally bilaterally. ANTERIOR CIRCULATION: --Intracranial internal carotid arteries: The supraclinoid left ICA is now patent. --Anterior cerebral arteries (ACA): Normal. --Middle cerebral arteries (MCA): Normal flow related enhancement bilaterally. Anatomic variants: None IMPRESSION: 1. A few scattered punctate foci of new acute/early subacute ischemia within multiple vascular territories. Multiple other recent foci of subacute ischemia as seen on 05/10/2022. 2. The supraclinoid left ICA and the left MCA are now patent. 3. 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 Electronically Signed   By: Ulyses Jarred M.D.   On: 06/05/2022 02:00   ECHOCARDIOGRAM COMPLETE  Result Date: 06/04/2022    ECHOCARDIOGRAM REPORT   Patient Name:   ZONIA CAPLIN Date of Exam: 06/04/2022 Medical Rec #:  [id number]            Height:       63.0 in Accession #:    [id number]  Weight:       193.6 lb Date of Birth:  September 07, 1940             BSA:          1.907 m Patient Age:    81 years             BP:           128/54 mmHg Patient Gender: F                    HR:           40 bpm. Exam Location:   Inpatient Procedure: 2D Echo, Cardiac Doppler and Color Doppler Indications:    Stroke I63.9  History:        Patient has prior history of Echocardiogram examinations, most                 recent 05/11/2022. CHF, CAD, Stroke, Arrythmias:Atrial                 Fibrillation and Bradycardia; Risk Factors:Hypertension and                 Dyslipidemia. CKD, stage III.                  Aortic Valve: valve is present in the aortic position. Procedure                 Date: 02/10/22.  Sonographer:    Ronny Flurry Referring Phys: [id number] ASHISH ARORA IMPRESSIONS  1. Left ventricular ejection fraction, by estimation, is 60 to 65%. The left ventricle has normal function. The left ventricle has no regional wall motion abnormalities. There is moderate concentric left ventricular hypertrophy. Left ventricular diastolic function could not be evaluated.  2. Right ventricular systolic function is normal. The right ventricular size is normal. There is normal pulmonary artery systolic pressure.  3. The mitral valve is degenerative. Mild to moderate mitral valve regurgitation. No evidence of mitral stenosis. Moderate to severe mitral annular calcification.  4. Tricuspid valve regurgitation is mild to moderate.  5. 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 Aortic valve regurgitation is not visualized. No aortic stenosis is present. There is a valve present in the aortic position. Procedure Date: 02/10/22.  6. The inferior vena cava is dilated in size with <50% respiratory variability, suggesting right atrial pressure of 15 mmHg. FINDINGS  Left Ventricle: Left ventricular ejection fraction, by estimation, is 60 to 65%. The left ventricle has normal function. The left ventricle has no regional wall motion abnormalities. The left ventricular internal cavity size was normal in size. There is  moderate concentric left ventricular hypertrophy. Abnormal (paradoxical) septal motion, consistent with left  bundle branch block. Left ventricular diastolic function could not be evaluated due to mitral annular calcification (moderate or greater). Left ventricular diastolic function could not be evaluated. Right Ventricle: The right ventricular size is normal. No increase in right ventricular wall thickness. Right ventricular systolic function is normal. There is normal pulmonary artery systolic pressure. The tricuspid regurgitant velocity is 2.26 m/s, and  with an assumed right atrial pressure of 15 mmHg, the estimated right ventricular systolic pressure is 383.1mmHg. Left Atrium: Left atrial size was normal in size. Right Atrium: Right atrial size was normal in size. Pericardium: There is no evidence of pericardial effusion. Presence of epicardial fat layer. Mitral Valve: The mitral valve is degenerative in appearance. Moderate to severe mitral annular calcification. Mild to  moderate mitral valve regurgitation. No evidence of mitral valve stenosis. Tricuspid Valve: The tricuspid valve is normal in structure. Tricuspid valve regurgitation is mild to moderate. No evidence of tricuspid stenosis. Aortic Valve: S/p TAVR (60m Medtronic CoreValue-Evolut pro) with well-seated bioprosthetic valve. Aortic valve regurgitation is not visualized. No aortic stenosis is present. Aortic valve mean gradient measures 7.0 mmHg. Aortic valve peak gradient measures 15.8 mmHg. Aortic valve area, by VTI measures 1.81 cm. There is a valve present in the aortic position. Procedure Date: 02/10/22. Pulmonic Valve: The pulmonic valve was normal in structure. Pulmonic valve regurgitation is not visualized. No evidence of pulmonic stenosis. Aorta: The aortic root is normal in size and structure. Venous: The inferior vena cava is dilated in size with less than 50% respiratory variability, suggesting right atrial pressure of 15 mmHg. IAS/Shunts: No atrial level shunt detected by color flow Doppler.  LEFT VENTRICLE PLAX 2D LVIDd:         4.30 cm    Diastology LVIDs:         2.50 cm   LV e' medial:    4.32 cm/s LV PW:         1.70 cm   LV E/e' medial:  25.2 LV IVS:        1.30 cm   LV e' lateral:   5.54 cm/s LVOT diam:     1.70 cm   LV E/e' lateral: 19.7 LV SV:         101 LV SV Index:   53 LVOT Area:     2.27 cm  RIGHT VENTRICLE             IVC RV S prime:     16.00 cm/s  IVC diam: 2.50 cm TAPSE (M-mode): 2.7 cm LEFT ATRIUM             Index        RIGHT ATRIUM           Index LA diam:        3.40 cm 1.78 cm/m   RA Area:     17.00 cm LA Vol (A2C):   39.4 ml 20.66 ml/m  RA Volume:   43.50 ml  22.81 ml/m LA Vol (A4C):   56.1 ml 29.42 ml/m LA Biplane Vol: 50.4 ml 26.43 ml/m  AORTIC VALVE AV Area (Vmax):    1.75 cm AV Area (Vmean):   2.01 cm AV Area (VTI):     1.81 cm AV Vmax:           199.00 cm/s AV Vmean:          119.000 cm/s AV VTI:            0.557 m AV Peak Grad:      15.8 mmHg AV Mean Grad:      7.0 mmHg LVOT Vmax:         153.50 cm/s LVOT Vmean:        105.500 cm/s LVOT VTI:          0.444 m LVOT/AV VTI ratio: 0.80  AORTA Ao Root diam: 2.20 cm Ao Asc diam:  2.80 cm MITRAL VALVE                TRICUSPID VALVE MV Area (PHT): 2.69 cm     TR Peak grad:   20.4 mmHg MV Decel Time: 282 msec     TR Vmax:        226.00 cm/s MV E velocity: 109.00 cm/s MV A  velocity: 118.50 cm/s  SHUNTS MV E/A ratio:  0.92         Systemic VTI:  0.44 m                             Systemic Diam: 1.70 cm Kardie Tobb DO Electronically signed by Berniece Salines DO Signature Date/Time: 06/04/2022/2:31:40 PM    Final    IR PERCUTANEOUS ART THROMBECTOMY/INFUSION INTRACRANIAL INC DIAG ANGIO  Result Date: 06/04/2022 INDICATION: 81 year old female with past medical history significant for recent embolic strokes, sick sinus syndrome, paroxysmal A-fib on anticoagulation with a DOAC, hypertension, hyperlipidemia, obstructive CAD, HFpEF, 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 She was brought to ED after being found unresponsive; last  known well 7 a.m. on 06/04/2022. Upon evaluation, leftward gaze, right-sided weakness and aphasia were noted; NIHSS. Head CT showed no large acute infarct or hemorrhage. CT angiogram of the head and neck showed a left ICA terminus occlusion. 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 She was brought to our service for emergency mechanical thrombectomy. EXAM: ULTRASOUND-GUIDED VASCULAR ACCESS DIAGNOSTIC CEREBRAL ANGIOGRAM MECHANICAL THROMBECTOMY FLAT PANEL HEAD CT COMPARISON:  CT/CT angiogram of the head and neck June 04, 2022. MEDICATIONS: 2 g of Ancef IV. The antibiotic was administered within 1 hour of the procedure ANESTHESIA/SEDATION: The procedure was performed under general anesthesia. CONTRAST:  50 mL of Omnipaque 300 milligram/mL FLUOROSCOPY: Radiation Exposure Index (as provided by the fluoroscopic device): 009 mGy Kerma COMPLICATIONS: None immediate. TECHNIQUE: Informed written consent was obtained from the patient's daughter after a thorough discussion of the procedural risks, benefits and alternatives. All questions were addressed. Maximal Sterile Barrier Technique was utilized including caps, mask, sterile gowns, sterile gloves, sterile drape, hand hygiene and skin antiseptic. A timeout was performed prior to the initiation of the procedure. The right groin was prepped and draped in the usual sterile fashion. Using a micropuncture kit and the modified Seldinger technique, access was gained to the right common femoral artery and an 8 French sheath was placed. Real-time ultrasound guidance was utilized for vascular access including the acquisition of a permanent ultrasound image documenting patency of the accessed vessel. Under fluoroscopy, a Zoom 88 guide catheter was navigated over a 6 [place] VTK catheter and a 0.035" Terumo Glidewire into the aortic arch. The catheter was placed into the left common carotid artery and then advanced into the left internal carotid  artery. The diagnostic catheter was removed. Frontal and lateral angiograms of the head were obtained. Mechanical thrombectomy was then carried out as described below. After mechanical thrombectomy, a 5 [place] Berenstein 2 catheter was navigated over a Terumo Glidewire into the aortic arch. The catheter was placed into the left subclavian artery and then advanced into the left vertebral artery. Frontal and lateral angiograms of the head obtained. The catheter was subsequently withdrawn. Right common femoral artery angiogram was obtained in right anterior oblique view. The puncture is at the level of the common femoral artery. The artery has normal caliber, adequate for closure device. The sheath was exchanged over the wire for a Perclose prostyle which was utilized for access closure. Immediate hemostasis was achieved. FINDINGS: 1. Normal caliber of the right common femoral artery, likely for vascular access. 2. Atherosclerotic changes of the left carotid bifurcation without hemodynamically significant stenosis. 3. 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 4. Near occlusive and chronic appearing stenosis  of the bilateral P1/PCA segments. PROCEDURE: Using biplane roadmap guidance, a zoom 71 aspiration catheter was navigated over Colossus 35 microguidewire into the cavernous segment of the left ICA. The aspiration catheter was then advanced to the level of occlusion and connected to an aspiration pump. Continuous aspiration was performed for 2 minutes. The guide catheter was connected to a VacLok syringe. The aspiration catheter was subsequently removed under constant aspiration. The guide catheter was aspirated for debris. 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 Using biplane roadmap guidance, a zoom 71 aspiration catheter was navigated over  a phenom 21 microcatheter and an Aristotle 14 microguidewire into the cavernous segment of the left ICA. The microcatheter was then navigated over the wire into the left M2/MCA posterior division branch. Then, a 4 x 40 mm solitaire stent retriever was deployed spanning the ICA terminus, M1 and proximal M2 segments. The device was allowed to intercalated with the clot for 4 minutes. The microcatheter was removed. The aspiration catheter was advanced to the level of occlusion and connected to an aspiration pump. The thrombectomy device and aspiration catheter were removed under constant aspiration. The guide catheter was aspirated for debris. Left internal carotid artery angiograms with frontal lateral views of the head showed complete recanalization of the left ICA, MCA/ACA vascular trees. Flat panel CT of the head was obtained and post processed in a separate workstation with concurrent attending physician supervision. Selected images were sent to PACS. No evidence of hemorrhagic complication. IMPRESSION: Successful mechanical thrombectomy for treatment of a left ICA terminus occlusion with complete recanalization at the 2 passes (TICI 3). PLAN: Patient transferred to ICU for continued post stroke care. Electronically Signed   By: Pedro Earls M.D.   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 She was brought to ED after being found unresponsive; last known well 7 a.m. on 06/04/2022. Upon evaluation, leftward gaze, right-sided weakness and aphasia were noted; NIHSS. Head CT showed no large acute infarct or hemorrhage. CT angiogram of the head and neck  showed a left ICA terminus occlusion. 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 She was brought to our service for emergency mechanical thrombectomy. EXAM: ULTRASOUND-GUIDED VASCULAR ACCESS DIAGNOSTIC CEREBRAL ANGIOGRAM MECHANICAL THROMBECTOMY FLAT PANEL HEAD CT COMPARISON:  CT/CT angiogram of the head and neck June 04, 2022. MEDICATIONS: 2 g of Ancef IV. The antibiotic was administered within 1 hour of the procedure ANESTHESIA/SEDATION: The procedure was performed under general anesthesia. CONTRAST:  50 mL of Omnipaque 300 milligram/mL FLUOROSCOPY: Radiation Exposure Index (as provided by the fluoroscopic device): 621 mGy Kerma COMPLICATIONS: None immediate. TECHNIQUE: Informed written consent was obtained from the patient's daughter after a thorough discussion of the procedural risks, benefits and alternatives. All questions were addressed. Maximal Sterile Barrier Technique was utilized including caps, mask, sterile gowns, sterile gloves, sterile drape, hand hygiene and skin antiseptic. A timeout was performed prior to the initiation of the procedure. The right groin was prepped and draped in the usual sterile fashion. Using a micropuncture kit and the modified Seldinger technique, access was gained  to the right common femoral artery and an 8 French sheath was placed. Real-time ultrasound guidance was utilized for vascular access including the acquisition of a permanent ultrasound image documenting patency of the accessed vessel. Under fluoroscopy, a Zoom 88 guide catheter was navigated over a 6 [place] VTK catheter and a 0.035" Terumo Glidewire into the aortic arch. The catheter was placed into the left common carotid artery and then advanced into the left internal carotid artery. The diagnostic catheter was removed. Frontal and lateral angiograms of the head were obtained. Mechanical thrombectomy was then carried out as described below. After mechanical thrombectomy, a 5  [place] Berenstein 2 catheter was navigated over a Terumo Glidewire into the aortic arch. The catheter was placed into the left subclavian artery and then advanced into the left vertebral artery. Frontal and lateral angiograms of the head obtained. The catheter was subsequently withdrawn. Right common femoral artery angiogram was obtained in right anterior oblique view. The puncture is at the level of the common femoral artery. The artery has normal caliber, adequate for closure device. The sheath was exchanged over the wire for a Perclose prostyle which was utilized for access closure. Immediate hemostasis was achieved. FINDINGS: 1. Normal caliber of the right common femoral artery, likely for vascular access. 2. Atherosclerotic changes of the left carotid bifurcation without hemodynamically significant stenosis. 3. 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 4. Near occlusive and chronic appearing stenosis of the bilateral P1/PCA segments. PROCEDURE: Using biplane roadmap guidance, a zoom 71 aspiration catheter was navigated over Colossus 35 microguidewire into the cavernous segment of the left ICA. The aspiration catheter was then advanced to the level of occlusion and connected to an aspiration pump. Continuous aspiration was performed for 2 minutes. The guide catheter was connected to a VacLok syringe. The aspiration catheter was subsequently removed under constant aspiration. The guide catheter was aspirated for debris. 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 Using biplane roadmap guidance, a zoom 71 aspiration catheter was navigated over a phenom 21 microcatheter and an Aristotle 14 microguidewire into the cavernous segment of the left ICA. The microcatheter was then navigated over the wire into the left M2/MCA posterior division branch.  Then, a 4 x 40 mm solitaire stent retriever was deployed spanning the ICA terminus, M1 and proximal M2 segments. The device was allowed to intercalated with the clot for 4 minutes. The microcatheter was removed. The aspiration catheter was advanced to the level of occlusion and connected to an aspiration pump. The thrombectomy device and aspiration catheter were removed under constant aspiration. The guide catheter was aspirated for debris. Left internal carotid artery angiograms with frontal lateral views of the head showed complete recanalization of the left ICA, MCA/ACA vascular trees. Flat panel CT of the head was obtained and post processed in a separate workstation with concurrent attending physician supervision. Selected images were sent to PACS. No evidence of hemorrhagic complication. IMPRESSION: Successful mechanical thrombectomy for treatment of a left ICA terminus occlusion with complete recanalization at the 2 passes (TICI 3). PLAN: Patient transferred to ICU for continued post stroke care. Electronically Signed   By: Pedro Earls M.D.   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 She was brought to ED after being found unresponsive; last known well 7 a.m. on 06/04/2022. Upon evaluation, leftward gaze, right-sided weakness and aphasia were noted; NIHSS. Head CT showed no large acute infarct or hemorrhage. CT angiogram of the head and neck showed a left ICA terminus occlusion. 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 She was brought to our service for emergency mechanical thrombectomy. EXAM: ULTRASOUND-GUIDED VASCULAR  ACCESS DIAGNOSTIC CEREBRAL ANGIOGRAM MECHANICAL THROMBECTOMY FLAT PANEL HEAD CT COMPARISON:  CT/CT angiogram of the head and neck June 04, 2022. MEDICATIONS: 2 g of Ancef IV. The antibiotic was administered within 1 hour of the procedure ANESTHESIA/SEDATION: The procedure was performed under general anesthesia. CONTRAST:  50 mL of Omnipaque 300 milligram/mL FLUOROSCOPY: Radiation Exposure Index (as provided by the fluoroscopic device): 478 mGy Kerma COMPLICATIONS: None immediate. TECHNIQUE: Informed written consent was obtained from the patient's daughter after a thorough discussion of the procedural risks, benefits and alternatives. All questions were addressed. Maximal Sterile Barrier Technique was utilized including caps, mask, sterile gowns, sterile gloves, sterile drape, hand hygiene and skin antiseptic. A timeout was performed prior to the initiation of the procedure. The right groin was prepped and draped in the usual sterile fashion. Using a micropuncture kit and the modified Seldinger technique, access was gained to the right common femoral artery and an 8 French sheath was placed. Real-time ultrasound guidance was utilized for vascular access including the acquisition of a permanent ultrasound image documenting patency of the accessed vessel. Under fluoroscopy, a Zoom 88 guide catheter was navigated over a 6 [place] VTK catheter and a 0.035" Terumo Glidewire into the aortic arch. The catheter was placed into the left common carotid artery and then advanced into the left internal carotid artery. The diagnostic catheter was removed. Frontal and lateral angiograms of the head were obtained. Mechanical thrombectomy was then carried out as described below. After mechanical thrombectomy, a 5 [place] Berenstein 2 catheter was navigated over a Terumo Glidewire into the aortic arch. The catheter was placed into the left subclavian artery and then advanced into the left vertebral artery. Frontal and lateral  angiograms of the head obtained. The catheter was subsequently withdrawn. Right common femoral artery angiogram was obtained in right anterior oblique view. The puncture is at the level of the common femoral artery. The artery has normal caliber, adequate for closure device. The sheath was exchanged over the wire for a Perclose prostyle which was utilized for access closure. Immediate hemostasis was achieved. FINDINGS: 1. Normal caliber of the right common femoral artery, likely for vascular access. 2. Atherosclerotic changes of the left carotid bifurcation without hemodynamically significant stenosis. 3. 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 4. Near occlusive and chronic appearing stenosis of the bilateral P1/PCA segments. PROCEDURE: Using biplane roadmap guidance, a zoom 71 aspiration catheter was navigated over Colossus 35 microguidewire into the cavernous segment of the left ICA. The aspiration catheter was then advanced to the level of occlusion and connected to an aspiration pump. Continuous aspiration was performed for 2 minutes. The guide catheter was connected to a VacLok syringe. The aspiration catheter was subsequently removed under constant aspiration. The guide catheter was aspirated for debris. The left internal carotid artery angiograms with frontal lateral views of the head showed persistent occlusion of the left ICA terminus distal to the origin of the anterior  choroidal artery. Using biplane roadmap guidance, a zoom 71 aspiration catheter was navigated over a phenom 21 microcatheter and an Aristotle 14 microguidewire into the cavernous segment of the left ICA. The microcatheter was then navigated over the wire into the left M2/MCA posterior division branch. Then, a 4 x 40 mm solitaire stent retriever was deployed spanning the ICA terminus, M1 and proximal M2 segments. The device was allowed to intercalated with the clot for 4 minutes. The microcatheter was removed.  The aspiration catheter was advanced to the level of occlusion and connected to an aspiration pump. The thrombectomy device and aspiration catheter were removed under constant aspiration. The guide catheter was aspirated for debris. Left internal carotid artery angiograms with frontal lateral views of the head showed complete recanalization of the left ICA, MCA/ACA vascular trees. Flat panel CT of the head was obtained and post processed in a separate workstation with concurrent attending physician supervision. Selected images were sent to PACS. No evidence of hemorrhagic complication. IMPRESSION: Successful mechanical thrombectomy for treatment of a left ICA terminus occlusion with complete recanalization at the 2 passes (TICI 3). PLAN: Patient transferred to ICU for continued post stroke care. Electronically Signed   By: Pedro Earls M.D.   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 She was brought to ED after being found unresponsive; last known well 7 a.m. on 06/04/2022. Upon evaluation, leftward gaze, right-sided weakness and aphasia were noted; NIHSS. Head CT showed no large acute infarct or hemorrhage. CT angiogram of the head and neck showed a left ICA terminus occlusion. 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 She was brought to our service for emergency mechanical thrombectomy. EXAM: ULTRASOUND-GUIDED VASCULAR ACCESS DIAGNOSTIC CEREBRAL ANGIOGRAM MECHANICAL THROMBECTOMY FLAT PANEL HEAD CT COMPARISON:  CT/CT angiogram of the head and neck June 04, 2022. MEDICATIONS: 2 g of Ancef IV. The  antibiotic was administered within 1 hour of the procedure ANESTHESIA/SEDATION: The procedure was performed under general anesthesia. CONTRAST:  50 mL of Omnipaque 300 milligram/mL FLUOROSCOPY: Radiation Exposure Index (as provided by the fluoroscopic device): 497 mGy Kerma COMPLICATIONS: None immediate. TECHNIQUE: Informed written consent was obtained from the patient's daughter after a thorough discussion of the procedural risks, benefits and alternatives. All questions were addressed. Maximal Sterile Barrier Technique was utilized including caps, mask, sterile gowns, sterile gloves, sterile drape, hand hygiene and skin antiseptic. A timeout was performed prior to the initiation of the procedure. The right groin was prepped and draped in the usual sterile fashion. Using a micropuncture kit and the modified Seldinger technique, access was gained to the right common femoral artery and an 8 French sheath was placed. Real-time ultrasound guidance was utilized for vascular access including the acquisition of a permanent ultrasound image documenting patency of the accessed vessel. Under fluoroscopy, a Zoom 88 guide catheter was navigated over a 6 [place] VTK catheter and a 0.035" Terumo Glidewire into the aortic arch. The catheter was placed into the left common carotid artery and then advanced into the left internal carotid artery. The diagnostic catheter was removed. Frontal and lateral angiograms of the head were obtained. Mechanical thrombectomy was then carried out as described below. After mechanical  thrombectomy, a 5 [place] Berenstein 2 catheter was navigated over a Terumo Glidewire into the aortic arch. The catheter was placed into the left subclavian artery and then advanced into the left vertebral artery. Frontal and lateral angiograms of the head obtained. The catheter was subsequently withdrawn. Right common femoral artery angiogram was obtained in right anterior oblique view. The puncture is at the level of  the common femoral artery. The artery has normal caliber, adequate for closure device. The sheath was exchanged over the wire for a Perclose prostyle which was utilized for access closure. Immediate hemostasis was achieved. FINDINGS: 1. Normal caliber of the right common femoral artery, likely for vascular access. 2. Atherosclerotic changes of the left carotid bifurcation without hemodynamically significant stenosis. 3. 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 4. Near occlusive and chronic appearing stenosis of the bilateral P1/PCA segments. PROCEDURE: Using biplane roadmap guidance, a zoom 71 aspiration catheter was navigated over Colossus 35 microguidewire into the cavernous segment of the left ICA. The aspiration catheter was then advanced to the level of occlusion and connected to an aspiration pump. Continuous aspiration was performed for 2 minutes. The guide catheter was connected to a VacLok syringe. The aspiration catheter was subsequently removed under constant aspiration. The guide catheter was aspirated for debris. 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 Using biplane roadmap guidance, a zoom 71 aspiration catheter was navigated over a phenom 21 microcatheter and an Aristotle 14 microguidewire into the cavernous segment of the left ICA. The microcatheter was then navigated over the wire into the left M2/MCA posterior division branch. Then, a 4 x 40 mm solitaire stent retriever was deployed spanning the ICA terminus, M1 and proximal M2 segments. The device was allowed to intercalated with the clot for 4 minutes. The microcatheter was removed. The aspiration catheter was advanced to the level of occlusion and connected to an aspiration pump. The thrombectomy device and aspiration catheter were removed under constant aspiration. The  guide catheter was aspirated for debris. Left internal carotid artery angiograms with frontal lateral views of the head showed complete recanalization of the left ICA, MCA/ACA vascular trees. Flat panel CT of the head was obtained and post processed in a separate workstation with concurrent attending physician supervision. Selected images were sent to PACS. No evidence of hemorrhagic complication. IMPRESSION: Successful mechanical thrombectomy for treatment of a left ICA terminus occlusion with complete recanalization at the 2 passes (TICI 3). PLAN: Patient transferred to ICU for continued post stroke care. Electronically Signed   By: Pedro Earls M.D.   On: 06/04/2022 12:19   DG CHEST PORT 1 VIEW  Result Date: 06/04/2022 CLINICAL DATA:  Endotracheal tube placement. EXAM: PORTABLE CHEST 1 VIEW COMPARISON:  May 10, 2022. FINDINGS: Stable cardiomegaly. Endotracheal and nasogastric tubes are in grossly good position. Lungs are clear. Bony thorax is unremarkable. Status post transcatheter aortic valve repair. IMPRESSION: Endotracheal and nasogastric tubes are in grossly good position. Electronically Signed   By: Marijo Conception M.D.   On: 06/04/2022 11:01   DG Abd 1 View  Result Date: 06/04/2022 CLINICAL DATA:  Enteric tube placement EXAM: ABDOMEN - 1 VIEW COMPARISON:  None Available. FINDINGS: Enteric tube tip projects over the distal stomach. Endotracheal tube tip projects 1.5 cm above the carina. Status post aortic valve replacement. Mitral annular calcifications. Partially imaged lung bases  appear clear. Excreted renal contrast within the collecting systems. Paucity of bowel gas in the partially imaged upper abdomen. IMPRESSION: Enteric tube tip projects over the distal stomach. Electronically Signed   By: Darrin Nipper M.D.   On: 06/04/2022 11:00   CT HEAD CODE STROKE WO CONTRAST  Addendum Date: 06/04/2022   ADDENDUM REPORT: 06/04/2022 09:29 ADDENDUM: ASPECTS score on the initial  noncontrast head CT is 10. Electronically Signed   By: Valetta Mole M.D.   On: 06/04/2022 09:29   Result Date: 06/04/2022 CLINICAL DATA:  Code stroke.  Neuro deficit EXAM: CT ANGIOGRAPHY HEAD AND NECK TECHNIQUE: Multidetector CT imaging of the head and neck was performed using the standard protocol during bolus administration of intravenous contrast. Multiplanar CT image reconstructions and MIPs were obtained to evaluate the vascular anatomy. Carotid stenosis measurements (when applicable) are obtained utilizing NASCET criteria, using the distal internal carotid diameter as the denominator. RADIATION DOSE REDUCTION: This exam was performed according to the departmental dose-optimization program which includes automated exposure control, adjustment of the mA and/or kV according to patient size and/or use of iterative reconstruction technique. CONTRAST:  75 cc Omnipaque 350 COMPARISON:  Brain MRI 05/10/2022 FINDINGS: CT HEAD FINDINGS Image quality is degraded by motion artifact. Brain: There is no evidence of acute intracranial hemorrhage, extra-axial fluid collection, or acute territorial infarct. Parenchymal volume is stable. The ventricles are stable in size. Small remote infarcts in the bilateral corona radiata and cerebellar hemispheres are again seen. Additional patchy hypodensity in the supratentorial white matter likely reflects underlying chronic small vessel ischemic change. There is no mass lesion.  There is no mass effect or midline shift. Vascular: See below. Skull: Normal. Negative for fracture or focal lesion. Sinuses/Orbits: The paranasal sinuses are clear. The globes and orbits are unremarkable. Other: None. Review of the MIP images confirms the above findings CTA NECK FINDINGS Aortic arch: There is calcified plaque in the aortic arch. The origins of the major branch vessels are patent. The subclavian arteries are patent to the level imaged. Right carotid system: The right common, internal, and  external carotid arteries are patent, with mild plaque of the bifurcation but no hemodynamically significant stenosis or occlusion. There is no dissection or aneurysm. Left carotid system: The left common, internal, and external carotid arteries are patent with mild plaque of the bifurcation but no hemodynamically significant stenosis or occlusion. There is no dissection or aneurysm. Vertebral arteries: There is calcified plaque of the vertebral artery origins without hemodynamically significant stenosis or occlusion. The left vertebral artery is dominant, a normal variant. There is no hemodynamically significant stenosis or occlusion. The vertebral arteries are patent, with no dissection or aneurysm. Skeleton: There is no acute osseous abnormality or suspicious osseous lesion. Other neck: The soft tissues of the neck are unremarkable. Upper chest: There is a right pleural effusion, incompletely imaged. There is interlobular septal thickening which may reflect pulmonary interstitial edema. Review of the MIP images confirms the above findings CTA HEAD FINDINGS Anterior circulation: The left ICA is occluded at the supraclinoid segment extending to the M1 segment. There is reconstitution of flow in the distal M1 segment with further occlusion of M2 branches in the sylvian fissure (8-104). There is diminutive reconstitution of flow in more distal MCA branches. The right intracranial ICA is patent with calcified plaque but no hemodynamically significant stenosis or occlusion. The right MCA is patent, without proximal high-grade stenosis or occlusion. The bilateral ACAs are patent. There is focal high-grade stenosis of the left  A3 segment (8-96). The left A1 segment is likely fills retrograde via the anterior communicating artery. There is no aneurysm or AVM. Posterior circulation: There is calcified plaque in the left V4 segment resulting in moderate stenosis. The non dominant right V4 segment is patent. The basilar  artery is patent. The major cerebellar arteries are patent. Both PCAs appear occluded shortly after their origins with diminutive reconstitution of flow in distal vessels. There is no aneurysm or AVM. Venous sinuses: As permitted by contrast timing, patent. Anatomic variants: None. Review of the MIP images confirms the above findings IMPRESSION: 1. No definite evidence of evolved left MCA infarct on motion degraded initial noncontrast head CT. 2. 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 There is diminutive reconstitution of flow in more distal MCA branches. 3. Occluded bilateral PCAs shortly after their origins with diminutive reconstitution of flow in distal vessels, favored chronic. 4. Focal high-grade stenosis of the left A3 segment. 5. Calcified plaque at the carotid bifurcations, left worse than right, and the bilateral vertebral artery origins without hemodynamically significant stenosis. 6. Incompletely imaged right pleural effusion and mild pulmonary interstitial edema. Findings communicated to Dr. Rory Percy 8:53 am. Electronically Signed: By: Valetta Mole M.D. On: 06/04/2022 09:09   CT ANGIO HEAD NECK W WO CM (CODE STROKE)  Addendum Date: 06/04/2022   ADDENDUM REPORT: 06/04/2022 09:29 ADDENDUM: ASPECTS score on the initial noncontrast head CT is 10. Electronically Signed   By: Valetta Mole M.D.   On: 06/04/2022 09:29   Result Date: 06/04/2022 CLINICAL DATA:  Code stroke.  Neuro deficit EXAM: CT ANGIOGRAPHY HEAD AND NECK TECHNIQUE: Multidetector CT imaging of the head and neck was performed using the standard protocol during bolus administration of intravenous contrast. Multiplanar CT image reconstructions and MIPs were obtained to evaluate the vascular anatomy. Carotid stenosis measurements (when applicable) are obtained utilizing NASCET criteria, using the distal internal carotid diameter  as the denominator. RADIATION DOSE REDUCTION: This exam was performed according to the departmental dose-optimization program which includes automated exposure control, adjustment of the mA and/or kV according to patient size and/or use of iterative reconstruction technique. CONTRAST:  75 cc Omnipaque 350 COMPARISON:  Brain MRI 05/10/2022 FINDINGS: CT HEAD FINDINGS Image quality is degraded by motion artifact. Brain: There is no evidence of acute intracranial hemorrhage, extra-axial fluid collection, or acute territorial infarct. Parenchymal volume is stable. The ventricles are stable in size. Small remote infarcts in the bilateral corona radiata and cerebellar hemispheres are again seen. Additional patchy hypodensity in the supratentorial white matter likely reflects underlying chronic small vessel ischemic change. There is no mass lesion.  There is no mass effect or midline shift. Vascular: See below. Skull: Normal. Negative for fracture or focal lesion. Sinuses/Orbits: The paranasal sinuses are clear. The globes and orbits are unremarkable. Other: None. Review of the MIP images confirms the above findings CTA NECK FINDINGS Aortic arch: There is calcified plaque in the aortic arch. The origins of the major branch vessels are patent. The subclavian arteries are patent to the level imaged. Right carotid system: The right common, internal, and external carotid arteries are patent, with mild plaque of the bifurcation but no hemodynamically significant stenosis or occlusion. There is no dissection or aneurysm. Left carotid system: The left common, internal, and external carotid arteries are patent with mild plaque of the bifurcation but no hemodynamically significant stenosis or occlusion. There is no  dissection or aneurysm. Vertebral arteries: There is calcified plaque of the vertebral artery origins without hemodynamically significant stenosis or occlusion. The left vertebral artery is dominant, a normal variant.  There is no hemodynamically significant stenosis or occlusion. The vertebral arteries are patent, with no dissection or aneurysm. Skeleton: There is no acute osseous abnormality or suspicious osseous lesion. Other neck: The soft tissues of the neck are unremarkable. Upper chest: There is a right pleural effusion, incompletely imaged. There is interlobular septal thickening which may reflect pulmonary interstitial edema. Review of the MIP images confirms the above findings CTA HEAD FINDINGS Anterior circulation: The left ICA is occluded at the supraclinoid segment extending to the M1 segment. There is reconstitution of flow in the distal M1 segment with further occlusion of M2 branches in the sylvian fissure (8-104). There is diminutive reconstitution of flow in more distal MCA branches. The right intracranial ICA is patent with calcified plaque but no hemodynamically significant stenosis or occlusion. The right MCA is patent, without proximal high-grade stenosis or occlusion. The bilateral ACAs are patent. There is focal high-grade stenosis of the left A3 segment (8-96). The left A1 segment is likely fills retrograde via the anterior communicating artery. There is no aneurysm or AVM. Posterior circulation: There is calcified plaque in the left V4 segment resulting in moderate stenosis. The non dominant right V4 segment is patent. The basilar artery is patent. The major cerebellar arteries are patent. Both PCAs appear occluded shortly after their origins with diminutive reconstitution of flow in distal vessels. There is no aneurysm or AVM. Venous sinuses: As permitted by contrast timing, patent. Anatomic variants: None. Review of the MIP images confirms the above findings IMPRESSION: 1. No definite evidence of evolved left MCA infarct on motion degraded initial noncontrast head CT. 2. Occlusion of the left intracranial ICA at the supraclinoid segment extending to the M1 segment with further occlusion of M2 branches  in the sylvian fissure. There is diminutive reconstitution of flow in more distal MCA branches. 3. Occluded bilateral PCAs shortly after their origins with diminutive reconstitution of flow in distal vessels, favored chronic. 4. Focal high-grade stenosis of the left A3 segment. 5. Calcified plaque at the carotid bifurcations, left worse than right, and the bilateral vertebral artery origins without hemodynamically significant stenosis. 6. Incompletely imaged right pleural effusion and mild pulmonary interstitial edema. Findings communicated to Dr. Rory Percy 8:53 am. Electronically Signed: By: Valetta Mole M.D. On: 06/04/2022 09:09    Labs:  CBC: Recent Labs    05/13/22 0606 05/21/22 0924 06/04/22 0835 06/04/22 0837 06/04/22 1146 06/05/22 0318  WBC 6.1 5.5 8.1  --   --  10.0  HGB 11.8* 11.6* 11.6* 11.2* 10.2* 11.1*  HCT 35.3* 34.9* 34.1* 33.0* 30.0* 34.0*  PLT 186 199 166  --   --  174    COAGS: Recent Labs    02/06/22 0956 05/10/22 0511 06/04/22 0835 06/05/22 0318  INR 2.2* 1.6* 1.3* 1.2  APTT  --  _0 BMP: Recent Labs    05/13/22 0606 05/21/22 0924 06/04/22 0835 06/04/22 0837 06/04/22 1146 06/05/22 0318  NA 136 136 141 141 140 141  K 4.1 4.1 3.7 3.7 3.4* 3.6  CL 107 104 109 107  --  110  CO2 _1 --   --  22  GLUCOSE 102* 123* 108* 107*  --  98  BUN 30* _2 --  21  CALCIUM 8.7* 8.8* 8.6*  --   --  8.5*  CREATININE 1.34* 1.19* 0.91 0.90  --  0.98  GFRNONAA 39* 45* >60  --   --  57*    LIVER FUNCTION TESTS: Recent Labs    05/10/22 0043 05/13/22 0606 05/21/22 0924 06/04/22 0835  BILITOT 0.5 0.7 0.2* 0.8  AST _0 ALT _1 ALKPHOS 60 53 60 76  PROT 7.0 5.6* 5.7* 5.8*  ALBUMIN 3.9 3.1* 3.1* 3.2*    Assessment and Plan:  81 y.o. female inpatient. History of CHF, CKD, a fib ( on eliquis) s/p TAVR. Presented to the ED at [facility] on 12.28.23 with AMS, right sided weakness and aphasia.  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 Raliegh Ip. Rebekah Pope.  Patient seen at bedside. No family present. Now extubated. Able to move all 4 extremities spontaneously. Right upper and lower extremity drift present. Expressive aphasia present.  Patient is able to shake and nod her head appropriately. Can say the word okay. When asked to state her name Patient's speech is unintelligible. Right groin access site is soft with no active bleeding and no appreciable pseudoaneurysm. Dressing is C/D/I.  MRA heads from 12.29.23 reads A few scattered punctate foci of new acute/early subacute ischemia within multiple vascular territories. Multiple other recent foci of subacute ischemia as seen on 05/10/2022. The supraclinoid left ICA and the left MCA are now patent. 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  Patient to be seen in [facility]. In 8 weeks time with carotid US. Follow up order placed. IR scheduler will call with appointment date and time. Please call [phone] with any questions or concerns    Electronically Signed: Jacqualine Mau, NP 06/05/2022, 12:36 PM   I spent a total of 15 Minutes at the patient's bedside AND on the patient's hospital floor or unit, greater than 50% of which was counseling/coordinating care for left ICA stroke s/p cerebral angiogram with direct contact aspiration (x1) and combined aspiration and stent retriever (x1) achieving

## 2022-06-05 NOTE — Progress Notes (Signed)
240 cc of fentanyl wasted with Harriett Sine RN

## 2022-06-05 NOTE — Telephone Encounter (Signed)
Pt daughter calling concerned about pt being in the hospital and would like to speak with Dr. Dulce Sellar about it. Informed her that he is out of the office right now but will forward to the nurse for advice.

## 2022-06-05 NOTE — Progress Notes (Signed)
SLP Cancellation Note  Patient Details Name: Alsie Younes MRN: [id number] DOB: [birth year]   Cancelled treatment:       Reason Eval/Treat Not Completed: Patient not medically ready (on vent this am). Will f/u for SLP cognitive-linguistic evaluation as able.     Mahala Menghini., M.A. CCC-SLP Acute Rehabilitation Services Office [phone]  Secure chat preferred  06/05/2022, 7:41 AM

## 2022-06-05 NOTE — Telephone Encounter (Signed)
I spoke to Dr. Bing Matter regarding looking at the patients chart and he did. He stated that per the notes it doesn't look like A-fib caused her stroke but she should reach out to the physicians taking care of her mother. I called the patient's daughter back and she had already been called by Dr. Clifton James who have talked with her and explained her mothers condition to her. The patient's daughter was much more calm and she thanked me for following up with her. She had no further questions and thanked me for the call.

## 2022-06-05 NOTE — Procedures (Signed)
Extubation Procedure Note  Patient Details:   Name: Rebekah Pope DOB: 1941/04/24 MRN: [id number]   Airway Documentation:    Vent end date: 06/05/22 Vent end time: 1003   Evaluation  O2 sats: stable throughout Complications: No apparent complications Patient did tolerate procedure well. Bilateral Breath Sounds: Clear, Diminished   Yes  Patient extubated per MD order. Positive cuff leak. Extubated to 2 LPM [place]. Strong cough, no stridor noted. Vitals stable. RT will continue monitor.   Alycen Mack M 06/05/2022, 10:36 AM

## 2022-06-05 NOTE — Progress Notes (Signed)
Patient transported to and from MRI on 100% Fi02 through the vent without incident. Patient was maintained on 420/18/100%/5+ throughout.

## 2022-06-05 NOTE — Telephone Encounter (Signed)
Called Rebekah Pope the patient's daughter and she stated that the patient had a major stroke and she was very concerned regarding the condition of her mother. She asked if we could have someone from our office look at the chart and give her a straight answer as to why her mother had, had a stroke. I explained that since her mother was already in the hospital she needed to reach out to the physicians that were taking care of her mother as they would be more familiar with her mothers plan of care. I also stated that I would see if I could speak to one of our doctors here to look at her chart She was agreeable with this plan.

## 2022-06-05 NOTE — Consult Note (Addendum)
Cardiology Consultation   Patient ID: Reily Treloar MRN: [id number]; DOB: 1940/09/19  Admit date: 06/04/2022 Date of Consult: 06/05/2022  PCP:  Rhea Bleacher, NP   [facility] Cardiologist:  Shirlee More, MD  Electrophysiologist:  Virl Axe, MD       Patient Profile:   Letisia Schwalb is a 81 y.o. 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 Erlinda Hong.  History of Present Illness:   Ms. Jimenez underwent TAVR 02/10/22, at his f/u visit he mentioned some dizzy spells, One episode was while sitting outside in the heat. The other was in the setting of bending over. Both were associated with nausea. ZIO monitor was placed. This showed 11 episodes of SVT with longest run at 4 beats. Minimum HR at 33bpm with an average at 45bpm. There was no VT, high grade blocks, or pauses    At subsequent visit dizziness was better but c/o fatigue, worse then pre-TAVR. Discussed bradycardia (40's) though not new and pre-dated TAVR. Chronically on amiodarone. Discussed longstanding LBBB, known SB in the 40's at her baseline.   AT her visit with Dr. Bettina Gavia 04/28/22 he felt fatigue was 2/2 her bradycardia with SSSx and referred to EP (scheduled to see Dr. Caryl Comes 05/20/22)   She was admitted to [facility] 05/10/22 by EMS with sudden onset nausea, dizziness, weakness, and emesis.  found with scattered tiny acute to subacute infarcts suggesting central embolic disease, brain atrophy and chronic small vessel ischemia with small chronic cerebellar infarcts bilaterally.  Cardiology consulted for elevated HS Trop and bradycardia. Known CAD, no anginal symptoms, no further from cardiology was recommended. Discharged 05/12/22, transitioned from Xarelto to Eliquis in d/w neurology and Dr. Bettina Gavia, and planned to keep her appt with EP out patient as  planned.   EP was asked to see her in CIR with family concerns she would miss her EP consult outpt We saw her 05/15/22 Appears to be progressing with rehab Pt and family inquired about HRs and pending EP evaluation, and we are asked to see her while here. Her VS flowsheet reviewed, HRs 40's mostly some higher towards 60's BPs stable No symptoms of bradycardia appreciated, with no urgent need for PPM, planned to let her complete rehab and her EP visit with Dr. Caryl Comes rescheduled.  Cardiology consulted during her rehab stay, 05/21/22, with reports of an orthostatic event of nausea, NO DIZZINESS, BP machine HR reported as jumping around but once said 29.  Unclear if this was machine error, though mildly + orthostatic BPs, planned for placement of a monitor with outpatient f/u  Discharged same day  Readmitted 06/04/22 with stroke upon getting back to bed after using the bathroom found unresponsive, EMS was called Found with  Exam consistent with a LVO-likely MCA on the left.  Noncontrast head CT unremarkable for bleed.  CT angiography head and neck With occlusion of the left intracranial ICA at the supraclinoid segment extending into the M1 segment with further occlusion of the M2 branches in the sylvian fissure.  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   Initially on cleviprex >> levophed and Neo >> both off this AM  EP is asked on board given her bradycardia.  LABS K+  3.6 BUN/Creat 21/0.98 Mag 2.0 WBC 10 H/H 11/34 Plts 174  The patient this AM is extubated and awake, alert, soft voice, hard to understand what she is saying. She shakes her head yes/no appropriately. Her husband, daughter and grand son are at bedside. Her husband states that everything was fine, she got up about 0730 to use the bathroom and went back to bed, about 32mn later  started shaking/grabbing at her head, not responsive otherwise and he called 911.  They report that a couple days prior she had a mechanical fall letting the cat out, hir her head/ R face (noting ecchymosis)  EMS record reviewed BP elevated/stable, HRs 60's-90's They found her pink, warm, dry, semi-alert, oriented to name, R sided deficit noted  EKG SB 59, LBBB, LAD There is one strip, significant artifact  ZIO Reviewed by myself No AFib has been detected HRs 37-64bpm On 12/28 there is data up until 0600, HR 39-47, no AFib  Past Medical History:  Diagnosis Date   Allergic rhinitis    CAD (coronary artery disease)    Chronic diastolic CHF (congestive heart failure) (HCC)    Chronic kidney disease, stage 3b (HCC)    Complex regional pain syndrome i of unspecified lower limb    Dizziness and giddiness    Essential (primary) hypertension    History of hiatal hernia    Hyperlipidemia    Hypothyroidism    LBBB (left bundle branch block)    Localized edema    Paroxysmal atrial fibrillation (HCC)    PONV (postoperative nausea and vomiting)    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 CBurt Knack& Dr. BCyndia Bent  Severe aortic stenosis    Sinus bradycardia    Stroke (cerebrum) ([facility]     Past Surgical History:  Procedure Laterality Date   CESAREAN SECTION     INTRAOPERATIVE TRANSTHORACIC ECHOCARDIOGRAM N/A 02/10/2022   Procedure: INTRAOPERATIVE TRANSTHORACIC ECHOCARDIOGRAM;  Surgeon: CSherren Mocha MD;  [facility];  Service: Open Heart Surgery;  Laterality: N/A;   IR ANGIO VERTEBRAL SEL VERTEBRAL UNI L MOD SED  06/04/2022   IR CT HEAD LTD  06/04/2022   IR PERCUTANEOUS ART THROMBECTOMY/INFUSION INTRACRANIAL INC DIAG ANGIO  06/04/2022   IR [place] VASC ACCESS RIGHT  06/04/2022   RIGHT HEART CATH AND CORONARY ANGIOGRAPHY N/A 10/31/2021   Procedure: RIGHT HEART CATH AND CORONARY ANGIOGRAPHY;   Surgeon: MBurnell Blanks MD;  [facility];  Service: Cardiovascular;  Laterality: N/A;   TRANSCATHETER AORTIC VALVE REPLACEMENT, TRANSFEMORAL N/A 02/10/2022   Procedure: Transcatheter Aortic Valve Replacement, Transfemoral;  Surgeon: CSherren Mocha MD;  [facility];  Service: Open Heart Surgery;  Laterality: N/A;     Home Medications:  Prior to Admission medications   Medication Sig Start Date End Date Taking? Authorizing Provider  acetaminophen (TYLENOL) 325 MG tablet Take 2 tablets (650 mg total) by mouth every 6 (six) hours as needed for mild pain, fever or headache. 05/19/22  Yes Angiulli, DLavon Paganini PA-C  amiodarone (PACERONE) 200 MG tablet Take 0.5 tablets (100 mg total) by mouth daily. 05/19/22  Yes Angiulli, DLavon Paganini PA-C  amLODipine (NORVASC) 10 MG tablet Take 1 tablet (10 mg total) by mouth at bedtime. 05/19/22  Yes Angiulli, DLavon Paganini PA-C  apixaban (ELIQUIS) 5 MG TABS tablet Take 1 tablet (5 mg total) by mouth 2 (two) times daily. 05/19/22  Yes Angiulli, Lavon Paganini, PA-C  atorvastatin (LIPITOR) 80 MG tablet Take 1 tablet (80 mg total) by mouth daily. 05/19/22  Yes Angiulli, Lavon Paganini, PA-C  ENTRESTO 24-26 MG Take 1 tablet by mouth 2 (two) times daily. 05/19/22  Yes Angiulli, Lavon Paganini, PA-C  hydrALAZINE (APRESOLINE) 25 MG tablet Take 1 tablet (25 mg total) by mouth 3 (three) times daily. 05/19/22  Yes Angiulli, Lavon Paganini, PA-C  levothyroxine (SYNTHROID) 112 MCG tablet Take 1 tablet (112 mcg total) by mouth daily before breakfast. 05/19/22  Yes Angiulli, Lavon Paganini, PA-C  oxyCODONE (OXY IR/ROXICODONE) 5 MG immediate release tablet Take 1 tablet (5 mg total) by mouth every 4 (four) hours as needed for moderate pain or severe pain. 05/19/22  Yes Angiulli, Lavon Paganini, PA-C    Inpatient Medications: Scheduled Meds:  atorvastatin  80 mg Per Tube Daily   Chlorhexidine Gluconate Cloth  6 each Topical Daily   enoxaparin (LOVENOX) injection  40 mg  Subcutaneous Q24H   famotidine  20 mg Per Tube Daily   levothyroxine  112 mcg Per Tube QAC breakfast   mouth rinse  15 mL Mouth Rinse Q2H   Continuous Infusions:  sodium chloride 75 mL/hr at 06/05/22 1100   sodium chloride     PRN Meds: acetaminophen **OR** acetaminophen (TYLENOL) oral liquid 160 mg/5 mL **OR** acetaminophen, iohexol, mouth rinse, senna-docusate  Allergies:   No Known Allergies  Social History:   Social History   Socioeconomic History   Marital status: Married    Spouse name: Richard   Number of children: 4   Years of education: Not on file   Highest education level: Not on file  Occupational History   Occupation: Midwife  Tobacco Use   Smoking status: Never    Passive exposure: Past   Smokeless tobacco: Never  Vaping Use   Vaping Use: Never used  Substance and Sexual Activity   Alcohol use: Never   Drug use: Never   Sexual activity: Not on file  Other Topics Concern   Not on file  Social History Narrative   Not on file   Social Determinants of Health   Financial Resource Strain: Not on file  Food Insecurity: No Food Insecurity (05/11/2022)   Hunger Vital Sign    Worried About Running Out of Food in the Last Year: Never true    Ran Out of Food in the Last Year: Never true  Transportation Needs: No Transportation Needs (05/11/2022)   PRAPARE - Hydrologist (Medical): No    Lack of Transportation (Non-Medical): No  Physical Activity: Not on file  Stress: Not on file  Social Connections: Not on file  Intimate Partner Violence: Not At Risk (05/11/2022)   Humiliation, Afraid, Rape, and Kick questionnaire    Fear of Current or Ex-Partner: No    Emotionally Abused: No    Physically Abused: No    Sexually Abused: No    Family History:   Family History  Problem Relation Age of Onset   Dementia Mother    Pneumonia Mother    Hip fracture Mother    Heart disease Father      ROS:  Please see the  history of present illness.  All other ROS reviewed and negative.     Physical Exam/Data:   Vitals:   06/05/22 0900 06/05/22 1000 06/05/22 1003 06/05/22 1100  BP: (!) 123/42 (!) 111/99  (!) 131/45  Pulse: (!) 38 (!) 50 (!) 55 (!) 45  Resp: _0 Temp:      TempSrc:      SpO2: 100% 99% 99% 99%  Weight:        Intake/Output Summary (Last 24 hours) at 06/05/2022 1210 Last data filed at 06/05/2022 1100 Gross per 24 hour  Intake 2078.97 ml  Output 450 ml  Net 1628.97 ml      06/04/2022    8:00 AM 06/03/2022    2:17 PM 05/18/2022    5:52 AM  Last 3 Weights  Weight (lbs) 193 lb 9 oz 190 lb 186 lb 11.2 oz  Weight (kg) 87.8 kg 86.183 kg 84.687 kg     Body mass index is 34.29 kg/m.  General:  Well nourished, well developed, in no acute distress HEENT: normal Neck: no JVD Vascular: No carotid bruits; Distal pulses 2+ bilaterally Cardiac:  RRR; no murmurs, gallops or rubs Lungs: CTA b/l, no wheezing, rhonchi or rales  Abd: soft, nontender Ext: no edema Musculoskeletal:  No deformities Skin: warm and dry  Neuro:  She is awake and alert, nod/shakes her head appropriately, very soft voice and hard to hear currently Full neuro evaluation not completed Psych:  quite pleasant  EKG:  The EKG was personally reviewed and demonstrates:  no new EKG in epic Telemetry:  Telemetry was personally reviewed and demonstrates:  SB lowest mid-high 30's > 50's currently., mostly 40's-50's  Relevant CV Studies:  06/04/22: TTE  1. Left ventricular ejection fraction, by estimation, is 60 to 65%. The  left ventricle has normal function. The left ventricle has no regional  wall motion abnormalities. There is moderate concentric left ventricular  hypertrophy. Left ventricular  diastolic function could not be evaluated.   2. Right ventricular systolic function is normal. The right ventricular  size is normal. There is normal pulmonary artery systolic pressure.   3. The mitral valve is  degenerative. Mild to moderate mitral valve  regurgitation. No evidence of mitral stenosis. Moderate to severe mitral  annular calcification.   4. Tricuspid valve regurgitation is mild to moderate.   5. S/p TAVR (46m Medtronic CoreValue-Evolut pro) with well-seated  bioprosthetic valve. Aortic valve regurgitation is not visualized. No  aortic stenosis is present. There is a valve present in the aortic  position. Procedure Date: 02/10/22.   6. The inferior vena cava is dilated in size with <50% respiratory  variability, suggesting right atrial pressure of 15 mmHg.   03/11/22: TTE 1. Left ventricular ejection fraction, by estimation, is 60 to 65%. The  left ventricle has normal function. The left ventricle has no regional  wall motion abnormalities. Left ventricular diastolic function could not  be evaluated.   2. Right ventricular systolic function is normal. The right ventricular  size is normal. There is normal pulmonary artery systolic pressure. The  estimated right ventricular systolic pressure is 243.3mmHg.   3. Left atrial size was mild to moderately dilated.   4. Right atrial size was mildly dilated.   5. The mitral valve is grossly normal. Mild mitral valve regurgitation.  Mild mitral stenosis. The mean mitral valve gradient is 4.0 mmHg with  average heart rate of 46 bpm. Severe mitral annular calcification.   6. The aortic valve has been repaired/replaced. Aortic valve  regurgitation is not visualized. There is a 26 mm Medtronic  CoreValve-Evolut Pro prosthetic (TAVR) valve present in the aortic  position. Procedure Date: 02/10/2022. Echo findings are consistent  with normal structure and function of the aortic valve prosthesis. Aortic  valve area, by VTI measures 2.27 cm. Aortic valve mean gradient measures  7.0 mmHg. Aortic valve Vmax measures 1.88 m/s.   7. The inferior vena cava is dilated in size with >50% respiratory  variability, suggesting right atrial pressure of 8 mmHg.    Comparison(s): No significant change from prior study.   Conclusion(s)/Recommendation(s): Stable TAVR. Sinus bradycardia in the 40s  throughout study.      02/10/22: LHC   Prox RCA lesion is 20% stenosed.   Dist RCA lesion is 20% stenosed.   RPDA lesion is 30% stenosed.   RPAV lesion is 30% stenosed.   Prox Cx lesion is 30% stenosed.   Ramus lesion is 99% stenosed.   Mid LAD lesion is 60% stenosed.   2nd Diag lesion is 70% stenosed.   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   The LAD has moderate mid vessel stenosis. The small diagonal branch has a moderately severe stenosis The Circumflex has mild non-obstructive disease. The small caliber intermediate branch has severe stenosis but is too small for PCI The RCA is a large dominant artery with mild non-obstructive disease in the proximal and distal vessel. The PDA and posterolateral branches have mild non-obstructive disease.  Severe aortic stenosis by echo. I did not cross the aortic valve today.  Elevated right heart pressures    Laboratory Data:  High Sensitivity Troponin:   Recent Labs  Lab 05/10/22 0043 05/10/22 0236  TROPONINIHS 105* 114*     Chemistry Recent Labs  Lab 06/04/22 0835 06/04/22 0837 06/04/22 1146 06/05/22 0318  NA 141 141 140 141  K 3.7 3.7 3.4* 3.6  CL 109 107  --  110  CO2 24  --   --  22  GLUCOSE 108* 107*  --  98  BUN 20 22  --  21  CREATININE 0.91 0.90  --  0.98  CALCIUM 8.6*  --   --  8.5*  MG  --   --   --  2.0  GFRNONAA >60  --   --  57*  ANIONGAP 8  --   --  9    Recent Labs  Lab 06/04/22 0835  PROT 5.8*  ALBUMIN 3.2*  AST 23  ALT 19  ALKPHOS 76  BILITOT 0.8   Lipids  Recent Labs  Lab 06/05/22 0316  CHOL 131  TRIG 74  HDL 65  LDLCALC 51  CHOLHDL 2.0    Hematology Recent Labs  Lab 06/04/22 0835 06/04/22 0837 06/04/22 1146 06/05/22 0318  WBC 8.1  --   --  10.0  RBC 3.85*  --   --  3.81*  HGB 11.6* 11.2* 10.2* 11.1*  HCT 34.1* 33.0*  30.0* 34.0*  MCV 88.6  --   --  89.2  MCH 30.1  --   --  29.1  MCHC 34.0  --   --  32.6  RDW 13.4  --   --  13.7  PLT 166  --   --  174   Thyroid  Recent Labs  Lab 06/04/22 0837  TSH 0.926    BNPNo results for input(s): "BNP", "PROBNP" in the last 168 hours.  DDimer No results for input(s): "DDIMER" in the last 168 hours.   Radiology/Studies:  MR BRAIN WO CONTRAST Result Date: 06/05/2022 CLINICAL DATA:  Stroke follow-up EXAM: MRI HEAD WITHOUT CONTRAST MRA HEAD WITHOUT CONTRAST TECHNIQUE: Multiplanar, multi-echo pulse sequences of the brain and surrounding structures were acquired without intravenous contrast. Angiographic images of the Circle of  [place] were acquired using MRA technique without intravenous contrast. COMPARISON:  05/10/2022 brain MRI 06/04/2022 CTA head neck. FINDINGS: MRI HEAD FINDINGS Brain: Multiple punctate foci of abnormal diffusion restriction within both hemispheres, some of which are new since 05/10/2022 (right corona radiata, left caudate, left mesial temporal lobe). No large area of acute ischemia. No acute hemorrhage or other extra-axial collection. No chronic microhemorrhage. There is multifocal hyperintense T2-weighted signal within the periventricular and deep white matter. Mild generalized atrophy. Midline structures are normal. Old bilateral cerebellar and left corona radiata small vessel infarcts. Vascular: Unchanged abnormal right vertebral artery flow void. The dominant left vertebral artery flow void remains normal. Normal ICA flow voids. Skull and upper cervical spine: Normal marrow signal. Sinuses/Orbits: Negative Other: None MRA HEAD FINDINGS POSTERIOR CIRCULATION: --Vertebral arteries: Left dominant.  Both are patent. --Inferior cerebellar arteries: Normal. --Basilar artery: Normal. --Superior cerebellar arteries: Normal. --Posterior cerebral arteries: Occluded proximally bilaterally. ANTERIOR CIRCULATION: --Intracranial internal carotid arteries: The  supraclinoid left ICA is now patent. --Anterior cerebral arteries (ACA): Normal. --Middle cerebral arteries (MCA): Normal flow related enhancement bilaterally. Anatomic variants: None IMPRESSION: 1. A few scattered punctate foci of new acute/early subacute ischemia within multiple vascular territories. Multiple other recent foci of subacute ischemia as seen on 05/10/2022. 2. The supraclinoid left ICA and the left MCA are now patent. 3. 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 Electronically Signed   By: Ulyses Jarred M.D.   On: 06/05/2022 02:00   MR ANGIO HEAD WO CONTRAST Result Date: 06/05/2022 CLINICAL DATA:  Stroke follow-up EXAM: MRI HEAD WITHOUT CONTRAST MRA HEAD WITHOUT CONTRAST TECHNIQUE: Multiplanar, multi-echo pulse sequences of the brain and surrounding structures were acquired without intravenous contrast. Angiographic images of the Circle of Willis were acquired using MRA technique without intravenous contrast. COMPARISON:  05/10/2022 brain MRI 06/04/2022 CTA head neck. FINDINGS: MRI HEAD FINDINGS Brain: Multiple punctate foci of abnormal diffusion restriction within both hemispheres, some of which are new since 05/10/2022 (right corona radiata, left caudate, left mesial temporal lobe). No large area of acute ischemia. No acute hemorrhage or other extra-axial collection. No chronic microhemorrhage. There is multifocal hyperintense T2-weighted signal within the periventricular and deep white matter. Mild generalized atrophy. Midline structures are normal. Old bilateral cerebellar and left corona radiata small vessel infarcts. Vascular: Unchanged abnormal right vertebral artery flow void. The dominant left vertebral artery flow void remains normal. Normal ICA flow voids. Skull and upper cervical spine: Normal marrow signal. Sinuses/Orbits: Negative Other: None MRA HEAD FINDINGS POSTERIOR CIRCULATION: --Vertebral arteries: Left dominant.  Both are patent. --Inferior  cerebellar arteries: Normal. --Basilar artery: Normal. --Superior cerebellar arteries: Normal. --Posterior cerebral arteries: Occluded proximally bilaterally. ANTERIOR CIRCULATION: --Intracranial internal carotid arteries: The supraclinoid left ICA is now patent. --Anterior cerebral arteries (ACA): Normal. --Middle cerebral arteries (MCA): Normal flow related enhancement bilaterally. Anatomic variants: None IMPRESSION: 1. A few scattered punctate foci of new acute/early subacute ischemia within multiple vascular territories. Multiple other recent foci of subacute ischemia as seen on 05/10/2022. 2. The supraclinoid left ICA and the left MCA are now patent. 3. 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 Electronically Signed   By: Ulyses Jarred M.D.   On: 06/05/2022 02:00    IR PERCUTANEOUS ART THROMBECTOMY/INFUSION INTRACRANIAL INC DIAG ANGIO Result Date: 06/04/2022 INDICATION: 81 year old female with past medical history significant for recent embolic strokes, sick sinus syndrome, paroxysmal A-fib on anticoagulation with a DOAC, hypertension, hyperlipidemia, obstructive CAD, HFpEF, severe arctic stenosis status post TAVR in September; baseline  modified Rankin scale 2-3. She was brought to ED after being found unresponsive; last known well 7 a.m. on 06/04/2022. Upon evaluation, leftward gaze, right-sided weakness and aphasia were noted; NIHSS. Head CT showed no large acute infarct or hemorrhage. CT angiogram of the head and neck showed a left ICA terminus occlusion. 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 She was brought to our service for emergency mechanical thrombectomy. EXAM: ULTRASOUND-GUIDED VASCULAR ACCESS DIAGNOSTIC CEREBRAL ANGIOGRAM MECHANICAL THROMBECTOMY FLAT PANEL HEAD CT COMPARISON:  CT/CT angiogram of the head and neck June 04, 2022. MEDICATIONS: 2 g of Ancef IV. The antibiotic was administered within 1 hour of the procedure  ANESTHESIA/SEDATION: The procedure was performed under general anesthesia. CONTRAST:  50 mL of Omnipaque 300 milligram/mL FLUOROSCOPY: Radiation Exposure Index (as provided by the fluoroscopic device): 109 mGy Kerma COMPLICATIONS: None immediate. TECHNIQUE: Informed written consent was obtained from the patient's daughter after a thorough discussion of the procedural risks, benefits and alternatives. All questions were addressed. Maximal Sterile Barrier Technique was utilized including caps, mask, sterile gowns, sterile gloves, sterile drape, hand hygiene and skin antiseptic. A timeout was performed prior to the initiation of the procedure. The right groin was prepped and draped in the usual sterile fashion. Using a micropuncture kit and the modified Seldinger technique, access was gained to the right common femoral artery and an 8 French sheath was placed. Real-time ultrasound guidance was utilized for vascular access including the acquisition of a permanent ultrasound image documenting patency of the accessed vessel. Under fluoroscopy, a Zoom 88 guide catheter was navigated over a 6 [place] VTK catheter and a 0.035" Terumo Glidewire into the aortic arch. The catheter was placed into the left common carotid artery and then advanced into the left internal carotid artery. The diagnostic catheter was removed. Frontal and lateral angiograms of the head were obtained. Mechanical thrombectomy was then carried out as described below. After mechanical thrombectomy, a 5 [place] Berenstein 2 catheter was navigated over a Terumo Glidewire into the aortic arch. The catheter was placed into the left subclavian artery and then advanced into the left vertebral artery. Frontal and lateral angiograms of the head obtained. The catheter was subsequently withdrawn. Right common femoral artery angiogram was obtained in right anterior oblique view. The puncture is at the level of the common femoral artery. The artery has normal caliber,  adequate for closure device. The sheath was exchanged over the wire for a Perclose prostyle which was utilized for access closure. Immediate hemostasis was achieved. FINDINGS: 1. Normal caliber of the right common femoral artery, likely for vascular access. 2. Atherosclerotic changes of the left carotid bifurcation without hemodynamically significant stenosis. 3. 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 4. Near occlusive and chronic appearing stenosis of the bilateral P1/PCA segments. PROCEDURE: Using biplane roadmap guidance, a zoom 71 aspiration catheter was navigated over Colossus 35 microguidewire into the cavernous segment of the left ICA. The aspiration catheter was then advanced to the level of occlusion and connected to an aspiration pump. Continuous aspiration was performed for 2 minutes. The guide catheter was connected to a VacLok syringe. The aspiration catheter was subsequently removed under constant aspiration. The guide catheter was aspirated for debris. 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 Using biplane roadmap guidance, a zoom 71 aspiration catheter was navigated over a phenom 21  microcatheter and an Aristotle 14 microguidewire into the cavernous segment of the left ICA. The microcatheter was then navigated over the wire into the left M2/MCA posterior division branch. Then, a 4 x 40 mm solitaire stent retriever was deployed spanning the ICA terminus, M1 and proximal M2 segments. The device was allowed to intercalated with the clot for 4 minutes. The microcatheter was removed. The aspiration catheter was advanced to the level of occlusion and connected to an aspiration pump. The thrombectomy device and aspiration catheter were removed under constant aspiration. The guide catheter was aspirated for debris. Left internal  carotid artery angiograms with frontal lateral views of the head showed complete recanalization of the left ICA, MCA/ACA vascular trees. Flat panel CT of the head was obtained and post processed in a separate workstation with concurrent attending physician supervision. Selected images were sent to PACS. No evidence of hemorrhagic complication. IMPRESSION: Successful mechanical thrombectomy for treatment of a left ICA terminus occlusion with complete recanalization at the 2 passes (TICI 3). PLAN: Patient transferred to ICU for continued post stroke care. Electronically Signed   By: Pedro Earls M.D.   On: 06/04/2022 12:19    DG CHEST PORT 1 VIEW Result Date: 06/04/2022 CLINICAL DATA:  Endotracheal tube placement. EXAM: PORTABLE CHEST 1 VIEW COMPARISON:  May 10, 2022. FINDINGS: Stable cardiomegaly. Endotracheal and nasogastric tubes are in grossly good position. Lungs are clear. Bony thorax is unremarkable. Status post transcatheter aortic valve repair. IMPRESSION: Endotracheal and nasogastric tubes are in grossly good position. Electronically Signed   By: Marijo Conception M.D.   On: 06/04/2022 11:01   DG Abd 1 View Result Date: 06/04/2022 CLINICAL DATA:  Enteric tube placement EXAM: ABDOMEN - 1 VIEW COMPARISON:  None Available. FINDINGS: Enteric tube tip projects over the distal stomach. Endotracheal tube tip projects 1.5 cm above the carina. Status post aortic valve replacement. Mitral annular calcifications. Partially imaged lung bases appear clear. Excreted renal contrast within the collecting systems. Paucity of bowel gas in the partially imaged upper abdomen. IMPRESSION: Enteric tube tip projects over the distal stomach. Electronically Signed   By: Darrin Nipper M.D.   On: 06/04/2022 11:00   CT HEAD CODE STROKE WO CONTRAST Addendum Date: 06/04/2022   ADDENDUM REPORT: 06/04/2022 09:29 ADDENDUM: ASPECTS score on the initial noncontrast head CT is 10. Electronically Signed   By: Valetta Mole M.D.   On: 06/04/2022 09:29  Result Date: 06/04/2022 CLINICAL DATA:  Code stroke.  Neuro deficit EXAM: CT ANGIOGRAPHY HEAD AND NECK TECHNIQUE: Multidetector CT imaging of the head and neck was performed using the standard protocol during bolus administration of intravenous contrast. Multiplanar CT image reconstructions and MIPs were obtained to evaluate the vascular anatomy. Carotid stenosis measurements (when applicable) are obtained utilizing NASCET criteria, using the distal internal carotid diameter as the denominator. RADIATION DOSE REDUCTION: This exam was performed according to the departmental dose-optimization program which includes automated exposure control, adjustment of the mA and/or kV according to patient size and/or use of iterative reconstruction technique. CONTRAST:  75 cc Omnipaque 350 COMPARISON:  Brain MRI 05/10/2022 FINDINGS: CT HEAD FINDINGS Image quality is degraded by motion artifact. Brain: There is no evidence of acute intracranial hemorrhage, extra-axial fluid collection, or acute territorial infarct. Parenchymal volume is stable. The ventricles are stable in size. Small remote infarcts in the bilateral corona radiata and cerebellar hemispheres are again seen. Additional patchy hypodensity in the supratentorial white matter likely reflects underlying chronic small vessel ischemic change. There  is no mass lesion.  There is no mass effect or midline shift. Vascular: See below. Skull: Normal. Negative for fracture or focal lesion. Sinuses/Orbits: The paranasal sinuses are clear. The globes and orbits are unremarkable. Other: None. Review of the MIP images confirms the above findings CTA NECK FINDINGS Aortic arch: There is calcified plaque in the aortic arch. The origins of the major branch vessels are patent. The subclavian arteries are patent to the level imaged. Right carotid system: The right common, internal, and external carotid arteries are patent, with mild plaque of the  bifurcation but no hemodynamically significant stenosis or occlusion. There is no dissection or aneurysm. Left carotid system: The left common, internal, and external carotid arteries are patent with mild plaque of the bifurcation but no hemodynamically significant stenosis or occlusion. There is no dissection or aneurysm. Vertebral arteries: There is calcified plaque of the vertebral artery origins without hemodynamically significant stenosis or occlusion. The left vertebral artery is dominant, a normal variant. There is no hemodynamically significant stenosis or occlusion. The vertebral arteries are patent, with no dissection or aneurysm. Skeleton: There is no acute osseous abnormality or suspicious osseous lesion. Other neck: The soft tissues of the neck are unremarkable. Upper chest: There is a right pleural effusion, incompletely imaged. There is interlobular septal thickening which may reflect pulmonary interstitial edema. Review of the MIP images confirms the above findings CTA HEAD FINDINGS Anterior circulation: The left ICA is occluded at the supraclinoid segment extending to the M1 segment. There is reconstitution of flow in the distal M1 segment with further occlusion of M2 branches in the sylvian fissure (8-104). There is diminutive reconstitution of flow in more distal MCA branches. The right intracranial ICA is patent with calcified plaque but no hemodynamically significant stenosis or occlusion. The right MCA is patent, without proximal high-grade stenosis or occlusion. The bilateral ACAs are patent. There is focal high-grade stenosis of the left A3 segment (8-96). The left A1 segment is likely fills retrograde via the anterior communicating artery. There is no aneurysm or AVM. Posterior circulation: There is calcified plaque in the left V4 segment resulting in moderate stenosis. The non dominant right V4 segment is patent. The basilar artery is patent. The major cerebellar arteries are patent. Both  PCAs appear occluded shortly after their origins with diminutive reconstitution of flow in distal vessels. There is no aneurysm or AVM. Venous sinuses: As permitted by contrast timing, patent. Anatomic variants: None. Review of the MIP images confirms the above findings IMPRESSION: 1. No definite evidence of evolved left MCA infarct on motion degraded initial noncontrast head CT. 2. 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 There is diminutive reconstitution of flow in more distal MCA branches. 3. Occluded bilateral PCAs shortly after their origins with diminutive reconstitution of flow in distal vessels, favored chronic. 4. Focal high-grade stenosis of the left A3 segment. 5. Calcified plaque at the carotid bifurcations, left worse than right, and the bilateral vertebral artery origins without hemodynamically significant stenosis. 6. Incompletely imaged right pleural effusion and mild pulmonary interstitial edema. Findings communicated to Dr. Rory Percy 8:53 am. Electronically Signed: By: Valetta Mole M.D. 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 CAD 10/31/21, 99% ramus, 60% mLAD, D2 70% without intervention  She denies any kind of CP< husband confirms that she had been feeling quite well Resume home meds when able  6. HFpEF Does not appear volume OL Resume home meds as/when able    For questions or updates, please contact [place] Please consult [email or web address] for contact info under    Signed, Baldwin [place], PA-C  06/05/2022 12:10 PM  I have personally seen and examined this patient. I agree with the assessment and plan as outlined above.  81 yo female with HTN, HLD, hypothyroidism, PAF, LBBB, chronic diastolic CHF, AS s/p TAVR, long standing sinus bradycardia who is well known to our EP service and is now admitted following a stroke. She has been wearing a Zio monitor since discharge two weeks ago. No evidence of atrial fibrillation. She has been on daily anti-coagulation with Eliquis and has not missed any doses. Sinus bradycardia on telemetry while admitted.  Unclear why she had a CVA but it seems less likely that it would be an embolic event from atrial fib since she has been on Eliquis with no documented recurrence of atrial fib over the past 2 weeks.   Labs reviewed by me My exam: She is awake and in NAD. CV:RRR. Lungs:clear bilaterally. Ext: no edema  Plan: No changes in regards to her sinus bradycardia. I do not think this is contributory to her presentation in any way. She has no evidence of recurrence of atrial fib on the Zio monitor she has been wearing at home. It is hard to explain an embolic event from atrial fib when she has had no recurrence of atrial fib over the past 14 days and has been on full dose Eliquis without missing any doses. I do not think this is a failure of Eliquis therapy. Her embolic event may be from atherosclerotic plaque. I would resume Eliquis when felt to be safe from a Neuro standpoint.   15 minute phone call with her daughter to explain my thoughts.   Lauree Chandler, MD, [facility] 06/05/2022 2:42 PM

## 2022-06-05 NOTE — Evaluation (Signed)
Occupational Therapy Evaluation Patient Details Name: Rebekah Pope MRN: [id number] DOB: [birth year] Today's Date: 06/05/2022   History of Present Illness 81 y.o. female presents to [facility] hospital on 06/04/2022 with sudden onset unresponsiveness. CTA revealed occlusion of L ICA. Pt underwent IR thrombectomy on 12/28, remaining intubated post-procedure. PMHx: complex regional pain syndrome, essential HTN, HLD, hypothyroidism, a fib, TVAR, embolic strokes.   Clinical Impression   PTA pt had recently discharged home after rehab at [facility] and family states she was receiving HH services, using her cane or RW for mobility and completing her bathing and dressing independently. Pt demonstrates a significant decline in functional status requiring mod A +2 for bed mobility and Max A for ADL tasks due to deficits listed below. Anticipate pt will make good progress with rehab/AIR and be able to DC home with 24/7 assistance of family. Acute OT to follow.      Recommendations for follow up therapy are one component of a multi-disciplinary discharge planning process, led by the attending physician.  Recommendations may be updated based on patient status, additional functional criteria and insurance authorization.   Follow Up Recommendations  Acute inpatient rehab (3hours/day)     Assistance Recommended at Discharge Frequent or constant Supervision/Assistance  Patient can return home with the following Two people to help with walking and/or transfers;Two people to help with bathing/dressing/bathroom;Assistance with feeding;Assistance with cooking/housework;Direct supervision/assist for medications management;Direct supervision/assist for financial management;Assist for transportation;Help with stairs or ramp for entrance    Functional Status Assessment  Patient has had a recent decline in their functional status and demonstrates the ability to make significant improvements in function in a reasonable and  predictable amount of time.  Equipment Recommendations  None recommended by OT    Recommendations for Other Services Rehab consult     Precautions / Restrictions Precautions Precautions: Fall;Other (comment) Precaution Comments: mild diplopia with impaired oculomotor function Restrictions Weight Bearing Restrictions: No      Mobility Bed Mobility Overal bed mobility: Needs Assistance Bed Mobility: Supine to Sit, Sit to Supine     Supine to sit: Mod assist, +2 for physical assistance, HOB elevated Sit to supine: Mod assist, +2 for physical assistance        Transfers Overall transfer level: Needs assistance Equipment used: 2 person hand held assist Transfers: Sit to/from Stand Sit to Stand: +2 physical assistance, From elevated surface, Max assist                  Balance Overall balance assessment: Needs assistance Sitting-balance support: No upper extremity supported, Feet supported Sitting balance-Leahy Scale: Fair     Standing balance support: Bilateral upper extremity supported, Reliant on assistive device for balance Standing balance-Leahy Scale: Poor Standing balance comment: modA x2                           ADL either performed or assessed with clinical judgement   ADL Overall ADL's : Needs assistance/impaired Eating/Feeding: NPO   Grooming: Moderate assistance   Upper Body Bathing: Moderate assistance   Lower Body Bathing: Maximal assistance   Upper Body Dressing : Maximal assistance   Lower Body Dressing: Total assistance Lower Body Dressing Details (indicate cue type and reason): attmepted to help donn socks at bed leve; appropriate movement pattern however unabl eto reach feet     Toileting- Clothing Manipulation and Hygiene: Total assistance       Functional mobility during ADLs: +2 for physical assistance;Moderate assistance (  to sit EOB; unable to clear hips off bed when attempting to stand)       Vision Baseline  Vision/History: 1 Wears glasses (not in room; family states diplopia at baseline which was being addressed at AIR; will further assess) Ability to See in Adequate Light: 1 Impaired Patient Visual Report: Blurring of vision;Diplopia Vision Assessment?: Yes Eye Alignment: Impaired (comment) Ocular Range of Motion: Impaired-to be further tested in functional context (R eye appears to have difficulty with abducting) Tracking/Visual Pursuits: Decreased smoothness of horizontal tracking;Decreased smoothness of vertical tracking Saccades: Additional eye shifts occurred during testing;Additional head turns occurred during testing Depth Perception: Undershoots Additional Comments: will continue to assess     Perception Perception Comments: will assess   Praxis Praxis Praxis tested?: Deficits Deficits: Perseveration;Organization Praxis-Other Comments: will continue to assess; repetitive movements at times; difficulty shifting movemetn patterns    Pertinent Vitals/Pain Pain Assessment Pain Assessment: Faces Faces Pain Scale: No hurt     Hand Dominance Left   Extremity/Trunk Assessment Upper Extremity Assessment Upper Extremity Assessment: RUE deficits/detail RUE Deficits / Details: moving out of synergy pattern; gross grasp/release; able to give thumbs up, "ok sign"; extend index finger; ableto hold sock with R hand; weaker proximally, PROM WFL elbow/shoulder. unable to touch hand to mouth in supine or sitting; able to lift R hand off bed @ 30 degrees; abnomral scapular humeral rythm RUE Sensation: decreased light touch RUE Coordination: decreased fine motor;decreased gross motor   Lower Extremity Assessment Lower Extremity Assessment: Defer to PT evaluation RLE Deficits / Details: grossly 3+/5 LLE Deficits / Details: grossly 4-/5   Cervical / Trunk Assessment Cervical / Trunk Assessment: Kyphotic (R bias with fatigue)   Communication Communication Communication: Receptive  difficulties;Expressive difficulties (expressive deficits are more significant than receptive)   Cognition Arousal/Alertness: Awake/alert Behavior During Therapy: Flat affect Overall Cognitive Status: Difficult to assess Area of Impairment: Following commands, Problem solving                       Following Commands: Follows one step commands with increased time     Problem Solving: Slow processing, Decreased initiation, Requires verbal cues, Difficulty sequencing, Requires tactile cues       General Comments  VSS on 2L    Exercises     Shoulder Instructions      Home Living Family/patient expects to be discharged to:: Private residence Living Arrangements: Spouse/significant other Available Help at Discharge: Family;Available 24 hours/day Type of Home: House Home Access: Stairs to enter [employer] of Steps: 1 Entrance Stairs-Rails: None Home Layout: One level;Laundry or work area in basement     [employer] Shower/Tub: Chief Strategy Officer: Handicapped height Bathroom Accessibility: Yes   Home Equipment: Agricultural consultant (2 wheels);Cane - single point   Additional Comments: live wtih husband, son is 2 miles away      Prior Functioning/Environment Prior Level of Function : Independent/Modified Independent             Mobility Comments: ambulatory with RW, continued to have intermittent dizzy spells ADLs Comments: performing ADLs independently although not getting into bathtub; family states she was having HH?        OT Problem List: Decreased strength;Decreased range of motion;Decreased activity tolerance;Impaired balance (sitting and/or standing);Impaired vision/perception;Decreased coordination;Decreased cognition;Decreased safety awareness;Decreased knowledge of use of DME or AE;Impaired sensation;Obesity;Impaired UE functional use      OT Treatment/Interventions: Self-care/ADL training;Therapeutic exercise;Neuromuscular  education;DME and/or AE instruction;Therapeutic activities;Cognitive remediation/compensation;Visual/perceptual remediation/compensation;Patient/family education;Balance training  OT Goals(Current goals can be found in the care plan section) Acute Rehab OT Goals Patient Stated Goal: to get better OT Goal Formulation: With patient/family Time For Goal Achievement: 06/19/22 Potential to Achieve Goals: Good  OT Frequency: Min 2X/week    Co-evaluation PT/OT/SLP Co-Evaluation/Treatment: Yes Reason for Co-Treatment: Complexity of the patient's impairments (multi-system involvement);For patient/therapist safety;To address functional/ADL transfers PT goals addressed during session: Mobility/safety with mobility;Balance;Strengthening/ROM OT goals addressed during session: ADL's and self-care      AM-PAC OT "6 Clicks" Daily Activity     Outcome Measure Help from another person eating meals?: Total (NPO) Help from another person taking care of personal grooming?: A Lot Help from another person toileting, which includes using toliet, bedpan, or urinal?: Total Help from another person bathing (including washing, rinsing, drying)?: A Lot Help from another person to put on and taking off regular upper body clothing?: A Lot Help from another person to put on and taking off regular lower body clothing?: Total 6 Click Score: 9   End of Session Equipment Utilized During Treatment: Gait belt Nurse Communication: Mobility status  Activity Tolerance: Patient tolerated treatment well Patient left: in bed;with call bell/phone within reach;with bed alarm set;with family/visitor present;with SCD's reapplied  OT Visit Diagnosis: Unsteadiness on feet (R26.81);Other abnormalities of gait and mobility (R26.89);Muscle weakness (generalized) (M62.81);Other symptoms and signs involving the nervous system (R29.898);Other symptoms and signs involving cognitive function;Hemiplegia and hemiparesis Hemiplegia -  Right/Left: Right Hemiplegia - dominant/non-dominant: Non-Dominant Hemiplegia - caused by: Cerebral infarction                Time: 1228-1300 OT Time Calculation (min): 32 min Charges:  OT General Charges $OT Visit: 1 Visit OT Evaluation $OT Eval Moderate Complexity: 1 Mod  Shadee Rathod, OT/L   Acute OT Clinical Specialist Acute Rehabilitation Services Pager [phone] Office [phone]   [facility] 06/05/2022, 3:34 PM

## 2022-06-06 DIAGNOSIS — J9601 Acute respiratory failure with hypoxia: Secondary | ICD-10-CM | POA: Diagnosis not present

## 2022-06-06 DIAGNOSIS — I495 Sick sinus syndrome: Secondary | ICD-10-CM | POA: Diagnosis not present

## 2022-06-06 DIAGNOSIS — I639 Cerebral infarction, unspecified: Secondary | ICD-10-CM | POA: Diagnosis not present

## 2022-06-06 DIAGNOSIS — I480 Paroxysmal atrial fibrillation: Secondary | ICD-10-CM | POA: Diagnosis not present

## 2022-06-06 LAB — CBC
HCT: 32.3 % — ABNORMAL LOW (ref 36.0–46.0)
Hemoglobin: 10.4 g/dL — ABNORMAL LOW (ref 12.0–15.0)
MCH: 28.9 pg (ref 26.0–34.0)
MCHC: 32.2 g/dL (ref 30.0–36.0)
MCV: 89.7 fL (ref 80.0–100.0)
Platelets: 186 10*3/uL (ref 150–400)
RBC: 3.6 MIL/uL — ABNORMAL LOW (ref 3.87–5.11)
RDW: 13.6 % (ref 11.5–15.5)
WBC: 7.2 10*3/uL (ref 4.0–10.5)
nRBC: 0 % (ref 0.0–0.2)

## 2022-06-06 LAB — GLUCOSE, CAPILLARY
Glucose-Capillary: 95 mg/dL (ref 70–99)
Glucose-Capillary: 105 mg/dL — ABNORMAL HIGH (ref 70–99)
Glucose-Capillary: 86 mg/dL (ref 70–99)
Glucose-Capillary: 97 mg/dL (ref 70–99)
Glucose-Capillary: 114 mg/dL — ABNORMAL HIGH (ref 70–99)
Glucose-Capillary: 104 mg/dL — ABNORMAL HIGH (ref 70–99)

## 2022-06-06 LAB — BASIC METABOLIC PANEL
Anion gap: 7 (ref 5–15)
BUN: 15 mg/dL (ref 8–23)
CO2: 22 mmol/L (ref 22–32)
Calcium: 8.4 mg/dL — ABNORMAL LOW (ref 8.9–10.3)
Chloride: 112 mmol/L — ABNORMAL HIGH (ref 98–111)
Creatinine, Ser: 0.89 mg/dL (ref 0.44–1.00)
GFR, Estimated: >60 mL/min (ref >60)
Glucose, Bld: 92 mg/dL (ref 70–99)
Potassium: 3.9 mmol/L (ref 3.5–5.1)
Sodium: 141 mmol/L (ref 135–145)

## 2022-06-06 MED ORDER — HYDRALAZINE HCL 20 MG/ML IJ SOLN
10.0000 mg | INTRAMUSCULAR | Status: DC | PRN
  Administered 2022-06-06 – 2022-06-10 (×6): 10 mg via INTRAVENOUS
  Filled 2022-06-06 (×7): qty 1

## 2022-06-06 MED ORDER — ATORVASTATIN CALCIUM 80 MG PO TABS
80.0000 mg | ORAL_TABLET | Freq: Every day | ORAL | Status: DC
  Administered 2022-06-06 – 2022-06-11 (×6): 80 mg via ORAL
  Filled 2022-06-06 (×5): qty 1

## 2022-06-06 MED ORDER — HYDRALAZINE HCL 25 MG PO TABS
25.0000 mg | ORAL_TABLET | Freq: Three times a day (TID) | ORAL | Status: DC
  Administered 2022-06-06 – 2022-06-09 (×10): 25 mg via ORAL
  Filled 2022-06-06 (×10): qty 1

## 2022-06-06 MED ORDER — APIXABAN 5 MG PO TABS
5.0000 mg | ORAL_TABLET | Freq: Two times a day (BID) | ORAL | Status: DC
  Administered 2022-06-06 – 2022-06-11 (×11): 5 mg via ORAL
  Filled 2022-06-06 (×11): qty 1

## 2022-06-06 MED ORDER — AMLODIPINE BESYLATE 10 MG PO TABS
10.0000 mg | ORAL_TABLET | Freq: Every day | ORAL | Status: DC
  Filled 2022-06-06: qty 1

## 2022-06-06 MED ORDER — FAMOTIDINE 20 MG PO TABS
20.0000 mg | ORAL_TABLET | Freq: Every day | ORAL | Status: DC
  Administered 2022-06-06 – 2022-06-11 (×6): 20 mg via ORAL
  Filled 2022-06-06 (×6): qty 1

## 2022-06-06 NOTE — Progress Notes (Cosign Needed)
Referring Physician(s): Code Stroke   Supervising Physician: Pedro Earls  Patient Status:  Rebekah Pope - In-pt  Chief Complaint:  Left ICA stroke s/p cerebral angiogram with direct contact aspiration (x1) and combined aspiration and stent retriever (x1) achieving with Dr. Raliegh Ip. [name]   Subjective:  Unable to assess due to ongoing expressive aphasa  Allergies: Patient has no known allergies.  Medications: Prior to Admission medications   Medication Sig Start Date End Date Taking? Authorizing Provider  acetaminophen (TYLENOL) 325 MG tablet Take 2 tablets (650 mg total) by mouth every 6 (six) hours as needed for mild pain, fever or headache. 05/19/22  Yes Angiulli, Lavon Paganini, PA-C  amiodarone (PACERONE) 200 MG tablet Take 0.5 tablets (100 mg total) by mouth daily. 05/19/22  Yes Angiulli, Lavon Paganini, PA-C  amLODipine (NORVASC) 10 MG tablet Take 1 tablet (10 mg total) by mouth at bedtime. 05/19/22  Yes Angiulli, Lavon Paganini, PA-C  apixaban (ELIQUIS) 5 MG TABS tablet Take 1 tablet (5 mg total) by mouth 2 (two) times daily. 05/19/22  Yes Angiulli, Lavon Paganini, PA-C  atorvastatin (LIPITOR) 80 MG tablet Take 1 tablet (80 mg total) by mouth daily. 05/19/22  Yes Angiulli, Lavon Paganini, PA-C  ENTRESTO 24-26 MG Take 1 tablet by mouth 2 (two) times daily. 05/19/22  Yes Angiulli, Lavon Paganini, PA-C  hydrALAZINE (APRESOLINE) 25 MG tablet Take 1 tablet (25 mg total) by mouth 3 (three) times daily. 05/19/22  Yes Angiulli, Lavon Paganini, PA-C  levothyroxine (SYNTHROID) 112 MCG tablet Take 1 tablet (112 mcg total) by mouth daily before breakfast. 05/19/22  Yes Angiulli, Lavon Paganini, PA-C  oxyCODONE (OXY IR/ROXICODONE) 5 MG immediate release tablet Take 1 tablet (5 mg total) by mouth every 4 (four) hours as needed for moderate pain or severe pain. 05/19/22  Yes Angiulli, Lavon Paganini, PA-C     Vital Signs: BP (!) 133/49   Pulse (!) 59   Temp 97.7 F (36.5 C) (Axillary)   Resp 19   Wt 193 lb 9 oz  (87.8 kg)   SpO2 97%   BMI 34.29 kg/m   Physical Exam Vitals and nursing note reviewed.  Constitutional:      Appearance: She is well-developed.  HENT:     Head: Normocephalic and atraumatic.  Eyes:     Conjunctiva/sclera: Conjunctivae normal.  Cardiovascular:     Rate and Rhythm: Regular rhythm. Bradycardia present.  Pulmonary:     Effort: Pulmonary effort is normal.  Musculoskeletal:        General: Normal range of motion.     Cervical back: Normal range of motion.  Skin:    General: Skin is warm.  Neurological:     Mental Status: She is alert.     Comments: Alert, aware  Expressive aphasia   No facial droop noted Tongue midline Can spontaneously move all 4 extremities. Right upper and lower drift  Gait not assessed Romberg not assessed Heel to toe not assessed Distal pulses not assessed      Imaging: MR BRAIN WO CONTRAST  Result Date: 06/05/2022 CLINICAL DATA:  Stroke follow-up EXAM: MRI HEAD WITHOUT CONTRAST MRA HEAD WITHOUT CONTRAST TECHNIQUE: Multiplanar, multi-echo pulse sequences of the brain and surrounding structures were acquired without intravenous contrast. Angiographic images of the Circle of Willis were acquired using MRA technique without intravenous contrast. COMPARISON:  05/10/2022 brain MRI 06/04/2022 CTA head neck. FINDINGS: MRI HEAD FINDINGS Brain: Multiple punctate foci of abnormal diffusion restriction within both hemispheres, some of which  are new since 05/10/2022 (right corona radiata, left caudate, left mesial temporal lobe). No large area of acute ischemia. No acute hemorrhage or other extra-axial collection. No chronic microhemorrhage. There is multifocal hyperintense T2-weighted signal within the periventricular and deep white matter. Mild generalized atrophy. Midline structures are normal. Old bilateral cerebellar and left corona radiata small vessel infarcts. Vascular: Unchanged abnormal right vertebral artery flow void. The dominant left  vertebral artery flow void remains normal. Normal ICA flow voids. Skull and upper cervical spine: Normal marrow signal. Sinuses/Orbits: Negative Other: None MRA HEAD FINDINGS POSTERIOR CIRCULATION: --Vertebral arteries: Left dominant.  Both are patent. --Inferior cerebellar arteries: Normal. --Basilar artery: Normal. --Superior cerebellar arteries: Normal. --Posterior cerebral arteries: Occluded proximally bilaterally. ANTERIOR CIRCULATION: --Intracranial internal carotid arteries: The supraclinoid left ICA is now patent. --Anterior cerebral arteries (ACA): Normal. --Middle cerebral arteries (MCA): Normal flow related enhancement bilaterally. Anatomic variants: None IMPRESSION: 1. A few scattered punctate foci of new acute/early subacute ischemia within multiple vascular territories. Multiple other recent foci of subacute ischemia as seen on 05/10/2022. 2. The supraclinoid left ICA and the left MCA are now patent. 3. 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 Electronically Signed   By: Ulyses Jarred M.D.   On: 06/05/2022 02:00   MR ANGIO HEAD WO CONTRAST  Result Date: 06/05/2022 CLINICAL DATA:  Stroke follow-up EXAM: MRI HEAD WITHOUT CONTRAST MRA HEAD WITHOUT CONTRAST TECHNIQUE: Multiplanar, multi-echo pulse sequences of the brain and surrounding structures were acquired without intravenous contrast. Angiographic images of the Circle of Willis were acquired using MRA technique without intravenous contrast. COMPARISON:  05/10/2022 brain MRI 06/04/2022 CTA head neck. FINDINGS: MRI HEAD FINDINGS Brain: Multiple punctate foci of abnormal diffusion restriction within both hemispheres, some of which are new since 05/10/2022 (right corona radiata, left caudate, left mesial temporal lobe). No large area of acute ischemia. No acute hemorrhage or other extra-axial collection. No chronic microhemorrhage. There is multifocal hyperintense T2-weighted signal within the periventricular and  deep white matter. Mild generalized atrophy. Midline structures are normal. Old bilateral cerebellar and left corona radiata small vessel infarcts. Vascular: Unchanged abnormal right vertebral artery flow void. The dominant left vertebral artery flow void remains normal. Normal ICA flow voids. Skull and upper cervical spine: Normal marrow signal. Sinuses/Orbits: Negative Other: None MRA HEAD FINDINGS POSTERIOR CIRCULATION: --Vertebral arteries: Left dominant.  Both are patent. --Inferior cerebellar arteries: Normal. --Basilar artery: Normal. --Superior cerebellar arteries: Normal. --Posterior cerebral arteries: Occluded proximally bilaterally. ANTERIOR CIRCULATION: --Intracranial internal carotid arteries: The supraclinoid left ICA is now patent. --Anterior cerebral arteries (ACA): Normal. --Middle cerebral arteries (MCA): Normal flow related enhancement bilaterally. Anatomic variants: None IMPRESSION: 1. A few scattered punctate foci of new acute/early subacute ischemia within multiple vascular territories. Multiple other recent foci of subacute ischemia as seen on 05/10/2022. 2. The supraclinoid left ICA and the left MCA are now patent. 3. 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 Electronically Signed   By: Ulyses Jarred M.D.   On: 06/05/2022 02:00   ECHOCARDIOGRAM COMPLETE  Result Date: 06/04/2022    ECHOCARDIOGRAM REPORT   Patient Name:   Rebekah Pope Date of Exam: 06/04/2022 Medical Rec #:  [id number]            Height:       63.0 in Accession #:    [id number]           Weight:       193.6 lb Date of Birth:  10-20-1940  BSA:          1.907 m Patient Age:    35 years             BP:           128/54 mmHg Patient Gender: F                    HR:           40 bpm. Exam Location:  Inpatient Procedure: 2D Echo, Cardiac Doppler and Color Doppler Indications:    Stroke I63.9  History:        Patient has prior history of Echocardiogram examinations, most                  recent 05/11/2022. CHF, CAD, Stroke, Arrythmias:Atrial                 Fibrillation and Bradycardia; Risk Factors:Hypertension and                 Dyslipidemia. CKD, stage III.                  Aortic Valve: valve is present in the aortic position. Procedure                 Date: 02/10/22.  Sonographer:    Ronny Flurry Referring Phys: [id number] ASHISH ARORA IMPRESSIONS  1. Left ventricular ejection fraction, by estimation, is 60 to 65%. The left ventricle has normal function. The left ventricle has no regional wall motion abnormalities. There is moderate concentric left ventricular hypertrophy. Left ventricular diastolic function could not be evaluated.  2. Right ventricular systolic function is normal. The right ventricular size is normal. There is normal pulmonary artery systolic pressure.  3. The mitral valve is degenerative. Mild to moderate mitral valve regurgitation. No evidence of mitral stenosis. Moderate to severe mitral annular calcification.  4. Tricuspid valve regurgitation is mild to moderate.  5. 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 Aortic valve regurgitation is not visualized. No aortic stenosis is present. There is a valve present in the aortic position. Procedure Date: 02/10/22.  6. The inferior vena cava is dilated in size with <50% respiratory variability, suggesting right atrial pressure of 15 mmHg. FINDINGS  Left Ventricle: Left ventricular ejection fraction, by estimation, is 60 to 65%. The left ventricle has normal function. The left ventricle has no regional wall motion abnormalities. The left ventricular internal cavity size was normal in size. There is  moderate concentric left ventricular hypertrophy. Abnormal (paradoxical) septal motion, consistent with left bundle branch block. Left ventricular diastolic function could not be evaluated due to mitral annular calcification (moderate or greater). Left ventricular diastolic function could  not be evaluated. Right Ventricle: The right ventricular size is normal. No increase in right ventricular wall thickness. Right ventricular systolic function is normal. There is normal pulmonary artery systolic pressure. The tricuspid regurgitant velocity is 2.26 m/s, and  with an assumed right atrial pressure of 15 mmHg, the estimated right ventricular systolic pressure is 354.6mmHg. Left Atrium: Left atrial size was normal in size. Right Atrium: Right atrial size was normal in size. Pericardium: There is no evidence of pericardial effusion. Presence of epicardial fat layer. Mitral Valve: The mitral valve is degenerative in appearance. Moderate to severe mitral annular calcification. Mild to moderate mitral valve regurgitation. No evidence of mitral valve stenosis. Tricuspid Valve: The tricuspid valve is normal in structure. Tricuspid valve regurgitation is mild to moderate.  No evidence of tricuspid stenosis. Aortic Valve: S/p TAVR (36m Medtronic CoreValue-Evolut pro) with well-seated bioprosthetic valve. Aortic valve regurgitation is not visualized. No aortic stenosis is present. Aortic valve mean gradient measures 7.0 mmHg. Aortic valve peak gradient measures 15.8 mmHg. Aortic valve area, by VTI measures 1.81 cm. There is a valve present in the aortic position. Procedure Date: 02/10/22. Pulmonic Valve: The pulmonic valve was normal in structure. Pulmonic valve regurgitation is not visualized. No evidence of pulmonic stenosis. Aorta: The aortic root is normal in size and structure. Venous: The inferior vena cava is dilated in size with less than 50% respiratory variability, suggesting right atrial pressure of 15 mmHg. IAS/Shunts: No atrial level shunt detected by color flow Doppler.  LEFT VENTRICLE PLAX 2D LVIDd:         4.30 cm   Diastology LVIDs:         2.50 cm   LV e' medial:    4.32 cm/s LV PW:         1.70 cm   LV E/e' medial:  25.2 LV IVS:        1.30 cm   LV e' lateral:   5.54 cm/s LVOT diam:     1.70 cm    LV E/e' lateral: 19.7 LV SV:         101 LV SV Index:   53 LVOT Area:     2.27 cm  RIGHT VENTRICLE             IVC RV S prime:     16.00 cm/s  IVC diam: 2.50 cm TAPSE (M-mode): 2.7 cm LEFT ATRIUM             Index        RIGHT ATRIUM           Index LA diam:        3.40 cm 1.78 cm/m   RA Area:     17.00 cm LA Vol (A2C):   39.4 ml 20.66 ml/m  RA Volume:   43.50 ml  22.81 ml/m LA Vol (A4C):   56.1 ml 29.42 ml/m LA Biplane Vol: 50.4 ml 26.43 ml/m  AORTIC VALVE AV Area (Vmax):    1.75 cm AV Area (Vmean):   2.01 cm AV Area (VTI):     1.81 cm AV Vmax:           199.00 cm/s AV Vmean:          119.000 cm/s AV VTI:            0.557 m AV Peak Grad:      15.8 mmHg AV Mean Grad:      7.0 mmHg LVOT Vmax:         153.50 cm/s LVOT Vmean:        105.500 cm/s LVOT VTI:          0.444 m LVOT/AV VTI ratio: 0.80  AORTA Ao Root diam: 2.20 cm Ao Asc diam:  2.80 cm MITRAL VALVE                TRICUSPID VALVE MV Area (PHT): 2.69 cm     TR Peak grad:   20.4 mmHg MV Decel Time: 282 msec     TR Vmax:        226.00 cm/s MV E velocity: 109.00 cm/s MV A velocity: 118.50 cm/s  SHUNTS MV E/A ratio:  0.92         Systemic VTI:  0.44 m  Systemic Diam: 1.70 cm Kardie Tobb DO Electronically signed by Berniece Salines DO Signature Date/Time: 06/04/2022/2:31:40 PM    Final    IR PERCUTANEOUS ART THROMBECTOMY/INFUSION INTRACRANIAL INC DIAG ANGIO  Result Date: 06/04/2022 INDICATION: 81 year old female with past medical history significant for recent embolic strokes, sick sinus syndrome, paroxysmal A-fib on anticoagulation with a DOAC, hypertension, hyperlipidemia, obstructive CAD, HFpEF, 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 She was brought to ED after being found unresponsive; last known well 7 a.m. on 06/04/2022. Upon evaluation, leftward gaze, right-sided weakness and aphasia were noted; NIHSS. Head CT showed no large acute infarct or hemorrhage. CT angiogram of  the head and neck showed a left ICA terminus occlusion. 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 She was brought to our service for emergency mechanical thrombectomy. EXAM: ULTRASOUND-GUIDED VASCULAR ACCESS DIAGNOSTIC CEREBRAL ANGIOGRAM MECHANICAL THROMBECTOMY FLAT PANEL HEAD CT COMPARISON:  CT/CT angiogram of the head and neck June 04, 2022. MEDICATIONS: 2 g of Ancef IV. The antibiotic was administered within 1 hour of the procedure ANESTHESIA/SEDATION: The procedure was performed under general anesthesia. CONTRAST:  50 mL of Omnipaque 300 milligram/mL FLUOROSCOPY: Radiation Exposure Index (as provided by the fluoroscopic device): 156 mGy Kerma COMPLICATIONS: None immediate. TECHNIQUE: Informed written consent was obtained from the patient's daughter after a thorough discussion of the procedural risks, benefits and alternatives. All questions were addressed. Maximal Sterile Barrier Technique was utilized including caps, mask, sterile gowns, sterile gloves, sterile drape, hand hygiene and skin antiseptic. A timeout was performed prior to the initiation of the procedure. The right groin was prepped and draped in the usual sterile fashion. Using a micropuncture kit and the modified Seldinger technique, access was gained to the right common femoral artery and an 8 French sheath was placed. Real-time ultrasound guidance was utilized for vascular access including the acquisition of a permanent ultrasound image documenting patency of the accessed vessel. Under fluoroscopy, a Zoom 88 guide catheter was navigated over a 6 [place] VTK catheter and a 0.035" Terumo Glidewire into the aortic arch. The catheter was placed into the left common carotid artery and then advanced into the left internal carotid artery. The diagnostic catheter was removed. Frontal and lateral angiograms of the head were obtained. Mechanical thrombectomy was then carried out as described below. After mechanical  thrombectomy, a 5 [place] Berenstein 2 catheter was navigated over a Terumo Glidewire into the aortic arch. The catheter was placed into the left subclavian artery and then advanced into the left vertebral artery. Frontal and lateral angiograms of the head obtained. The catheter was subsequently withdrawn. Right common femoral artery angiogram was obtained in right anterior oblique view. The puncture is at the level of the common femoral artery. The artery has normal caliber, adequate for closure device. The sheath was exchanged over the wire for a Perclose prostyle which was utilized for access closure. Immediate hemostasis was achieved. FINDINGS: 1. Normal caliber of the right common femoral artery, likely for vascular access. 2. Atherosclerotic changes of the left carotid bifurcation without hemodynamically significant stenosis. 3. 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 4. Near occlusive and chronic appearing stenosis of the bilateral P1/PCA segments. PROCEDURE: Using biplane roadmap guidance, a zoom 71 aspiration catheter was navigated over Colossus 35 microguidewire into the cavernous segment of the left ICA. The aspiration catheter was then advanced to the level of occlusion and connected to an aspiration pump. Continuous aspiration was performed for  2 minutes. The guide catheter was connected to a VacLok syringe. The aspiration catheter was subsequently removed under constant aspiration. The guide catheter was aspirated for debris. 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 Using biplane roadmap guidance, a zoom 71 aspiration catheter was navigated over a phenom 21 microcatheter and an Aristotle 14 microguidewire into the cavernous segment of the left ICA. The microcatheter was then navigated over the wire into the left M2/MCA  posterior division branch. Then, a 4 x 40 mm solitaire stent retriever was deployed spanning the ICA terminus, M1 and proximal M2 segments. The device was allowed to intercalated with the clot for 4 minutes. The microcatheter was removed. The aspiration catheter was advanced to the level of occlusion and connected to an aspiration pump. The thrombectomy device and aspiration catheter were removed under constant aspiration. The guide catheter was aspirated for debris. Left internal carotid artery angiograms with frontal lateral views of the head showed complete recanalization of the left ICA, MCA/ACA vascular trees. Flat panel CT of the head was obtained and post processed in a separate workstation with concurrent attending physician supervision. Selected images were sent to PACS. No evidence of hemorrhagic complication. IMPRESSION: Successful mechanical thrombectomy for treatment of a left ICA terminus occlusion with complete recanalization at the 2 passes (TICI 3). PLAN: Patient transferred to ICU for continued post stroke care. Electronically Signed   By: Pedro Earls M.D.   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 She was brought to ED after being found unresponsive; last known well 7 a.m. on 06/04/2022. Upon evaluation, leftward gaze, right-sided weakness and aphasia were noted; NIHSS. Head CT showed no large acute infarct or hemorrhage. CT angiogram of the head and neck showed a left ICA terminus occlusion. 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 She was brought to our service for emergency mechanical  thrombectomy. EXAM: ULTRASOUND-GUIDED VASCULAR ACCESS DIAGNOSTIC CEREBRAL ANGIOGRAM MECHANICAL THROMBECTOMY FLAT PANEL HEAD CT COMPARISON:  CT/CT angiogram of the head and neck June 04, 2022. MEDICATIONS: 2 g of Ancef IV. The antibiotic was administered within 1 hour of the procedure ANESTHESIA/SEDATION: The procedure was performed under general anesthesia. CONTRAST:  50 mL of Omnipaque 300 milligram/mL FLUOROSCOPY: Radiation Exposure Index (as provided by the fluoroscopic device): 902 mGy Kerma COMPLICATIONS: None immediate. TECHNIQUE: Informed written consent was obtained from the patient's daughter after a thorough discussion of the procedural risks, benefits and alternatives. All questions were addressed. Maximal Sterile Barrier Technique was utilized including caps, mask, sterile gowns, sterile gloves, sterile drape, hand hygiene and skin antiseptic. A timeout was performed prior to the initiation of the procedure. The right groin was prepped and draped in the usual sterile fashion. Using a micropuncture kit and the modified Seldinger technique, access was gained to the right common femoral artery and an 8 French sheath was placed. Real-time ultrasound guidance was utilized for vascular access including the acquisition of a permanent ultrasound image documenting patency of the accessed vessel. Under fluoroscopy, a Zoom 88 guide catheter was navigated over a 6 [place] VTK catheter and  a 0.035" Terumo Glidewire into the aortic arch. The catheter was placed into the left common carotid artery and then advanced into the left internal carotid artery. The diagnostic catheter was removed. Frontal and lateral angiograms of the head were obtained. Mechanical thrombectomy was then carried out as described below. After mechanical thrombectomy, a 5 [place] Berenstein 2 catheter was navigated over a Terumo Glidewire into the aortic arch. The catheter was placed into the left subclavian artery and then advanced into the  left vertebral artery. Frontal and lateral angiograms of the head obtained. The catheter was subsequently withdrawn. Right common femoral artery angiogram was obtained in right anterior oblique view. The puncture is at the level of the common femoral artery. The artery has normal caliber, adequate for closure device. The sheath was exchanged over the wire for a Perclose prostyle which was utilized for access closure. Immediate hemostasis was achieved. FINDINGS: 1. Normal caliber of the right common femoral artery, likely for vascular access. 2. Atherosclerotic changes of the left carotid bifurcation without hemodynamically significant stenosis. 3. 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 4. Near occlusive and chronic appearing stenosis of the bilateral P1/PCA segments. PROCEDURE: Using biplane roadmap guidance, a zoom 71 aspiration catheter was navigated over Colossus 35 microguidewire into the cavernous segment of the left ICA. The aspiration catheter was then advanced to the level of occlusion and connected to an aspiration pump. Continuous aspiration was performed for 2 minutes. The guide catheter was connected to a VacLok syringe. The aspiration catheter was subsequently removed under constant aspiration. The guide catheter was aspirated for debris. 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 Using biplane roadmap guidance, a zoom 71 aspiration catheter was navigated over a phenom 21 microcatheter and an Aristotle 14 microguidewire into the cavernous segment of the left ICA. The microcatheter was then navigated over the wire into the left M2/MCA posterior division branch. Then, a 4 x 40 mm solitaire stent retriever was deployed spanning the ICA terminus, M1 and proximal M2 segments. The device was allowed to intercalated with the clot for 4  minutes. The microcatheter was removed. The aspiration catheter was advanced to the level of occlusion and connected to an aspiration pump. The thrombectomy device and aspiration catheter were removed under constant aspiration. The guide catheter was aspirated for debris. Left internal carotid artery angiograms with frontal lateral views of the head showed complete recanalization of the left ICA, MCA/ACA vascular trees. Flat panel CT of the head was obtained and post processed in a separate workstation with concurrent attending physician supervision. Selected images were sent to PACS. No evidence of hemorrhagic complication. IMPRESSION: Successful mechanical thrombectomy for treatment of a left ICA terminus occlusion with complete recanalization at the 2 passes (TICI 3). PLAN: Patient transferred to ICU for continued post stroke care. Electronically Signed   By: Pedro Earls M.D.   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 She was brought to ED after being found unresponsive; last known well 7 a.m. on 06/04/2022. Upon evaluation, leftward gaze, right-sided weakness and aphasia were noted; NIHSS. Head CT showed  no large acute infarct or hemorrhage. CT angiogram of the head and neck showed a left ICA terminus occlusion. 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 She was brought to our service for emergency mechanical thrombectomy. EXAM: ULTRASOUND-GUIDED VASCULAR ACCESS DIAGNOSTIC CEREBRAL ANGIOGRAM MECHANICAL THROMBECTOMY FLAT PANEL HEAD CT COMPARISON:  CT/CT angiogram of the head and neck June 04, 2022. MEDICATIONS: 2 g of Ancef  IV. The antibiotic was administered within 1 hour of the procedure ANESTHESIA/SEDATION: The procedure was performed under general anesthesia. CONTRAST:  50 mL of Omnipaque 300 milligram/mL FLUOROSCOPY: Radiation Exposure Index (as provided by the fluoroscopic device): 546 mGy Kerma COMPLICATIONS: None immediate. TECHNIQUE: Informed written consent was obtained from the patient's daughter after a thorough discussion of the procedural risks, benefits and alternatives. All questions were addressed. Maximal Sterile Barrier Technique was utilized including caps, mask, sterile gowns, sterile gloves, sterile drape, hand hygiene and skin antiseptic. A timeout was performed prior to the initiation of the procedure. The right groin was prepped and draped in the usual sterile fashion. Using a micropuncture kit and the modified Seldinger technique, access was gained to the right common femoral artery and an 8 French sheath was placed. Real-time ultrasound guidance was utilized for vascular access including the acquisition of a permanent ultrasound image documenting patency of the accessed vessel. Under fluoroscopy, a Zoom 88 guide catheter was navigated over a 6 [place] VTK catheter and a 0.035" Terumo Glidewire into the aortic arch. The catheter was placed into the left common carotid artery and then advanced into the left internal carotid artery. The diagnostic catheter was removed. Frontal and lateral angiograms of the head were obtained. Mechanical thrombectomy was then carried out as described below. After mechanical thrombectomy, a 5 [place] Berenstein 2 catheter was navigated over a Terumo Glidewire into the aortic arch. The catheter was placed into the left subclavian artery and then advanced into the left vertebral artery. Frontal and lateral angiograms of the head obtained. The catheter was subsequently withdrawn. Right common femoral artery angiogram was obtained in right anterior oblique view. The puncture is at the  level of the common femoral artery. The artery has normal caliber, adequate for closure device. The sheath was exchanged over the wire for a Perclose prostyle which was utilized for access closure. Immediate hemostasis was achieved. FINDINGS: 1. Normal caliber of the right common femoral artery, likely for vascular access. 2. Atherosclerotic changes of the left carotid bifurcation without hemodynamically significant stenosis. 3. 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 4. Near occlusive and chronic appearing stenosis of the bilateral P1/PCA segments. PROCEDURE: Using biplane roadmap guidance, a zoom 71 aspiration catheter was navigated over Colossus 35 microguidewire into the cavernous segment of the left ICA. The aspiration catheter was then advanced to the level of occlusion and connected to an aspiration pump. Continuous aspiration was performed for 2 minutes. The guide catheter was connected to a VacLok syringe. The aspiration catheter was subsequently removed under constant aspiration. The guide catheter was aspirated for debris. 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 Using biplane roadmap guidance, a zoom 71 aspiration catheter was navigated over a phenom 21 microcatheter and an Aristotle 14 microguidewire into the cavernous segment of the left ICA. The microcatheter was then navigated over the wire into the left M2/MCA posterior division branch. Then, a 4 x 40  mm solitaire stent retriever was deployed spanning the ICA terminus, M1 and proximal M2 segments. The device was allowed to intercalated with the clot for 4 minutes. The microcatheter was removed. The aspiration catheter was advanced to the level of occlusion and connected to an aspiration pump. The thrombectomy device and aspiration catheter were removed under constant  aspiration. The guide catheter was aspirated for debris. Left internal carotid artery angiograms with frontal lateral views of the head showed complete recanalization of the left ICA, MCA/ACA vascular trees. Flat panel CT of the head was obtained and post processed in a separate workstation with concurrent attending physician supervision. Selected images were sent to PACS. No evidence of hemorrhagic complication. IMPRESSION: Successful mechanical thrombectomy for treatment of a left ICA terminus occlusion with complete recanalization at the 2 passes (TICI 3). PLAN: Patient transferred to ICU for continued post stroke care. Electronically Signed   By: Pedro Earls M.D.   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 She was brought to ED after being found unresponsive; last known well 7 a.m. on 06/04/2022. Upon evaluation, leftward gaze, right-sided weakness and aphasia were noted; NIHSS. Head CT showed no large acute infarct or hemorrhage. CT angiogram of the head and neck showed a left ICA terminus occlusion. 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 She was brought to our service for emergency mechanical thrombectomy. EXAM: ULTRASOUND-GUIDED VASCULAR ACCESS DIAGNOSTIC CEREBRAL ANGIOGRAM MECHANICAL THROMBECTOMY FLAT PANEL HEAD CT COMPARISON:  CT/CT angiogram of the head and neck June 04, 2022. MEDICATIONS: 2 g of Ancef IV. The antibiotic was administered within 1 hour of the procedure ANESTHESIA/SEDATION: The procedure was performed under general anesthesia. CONTRAST:  50 mL of Omnipaque 300  milligram/mL FLUOROSCOPY: Radiation Exposure Index (as provided by the fluoroscopic device): 051 mGy Kerma COMPLICATIONS: None immediate. TECHNIQUE: Informed written consent was obtained from the patient's daughter after a thorough discussion of the procedural risks, benefits and alternatives. All questions were addressed. Maximal Sterile Barrier Technique was utilized including caps, mask, sterile gowns, sterile gloves, sterile drape, hand hygiene and skin antiseptic. A timeout was performed prior to the initiation of the procedure. The right groin was prepped and draped in the usual sterile fashion. Using a micropuncture kit and the modified Seldinger technique, access was gained to the right common femoral artery and an 8 French sheath was placed. Real-time ultrasound guidance was utilized for vascular access including the acquisition of a permanent ultrasound image documenting patency of the accessed vessel. Under fluoroscopy, a Zoom 88 guide catheter was navigated over a 6 [place] VTK catheter and a 0.035" Terumo Glidewire into the aortic arch. The catheter was placed into the left common carotid artery and then advanced into the left internal carotid artery. The diagnostic catheter was removed. Frontal and lateral angiograms of the head were obtained. Mechanical thrombectomy was then carried out as described below. After mechanical thrombectomy, a 5 [place] Berenstein 2 catheter was navigated over a Terumo Glidewire into the aortic arch. The catheter was placed into the left subclavian artery and then advanced into the left vertebral artery. Frontal and lateral angiograms of the head obtained. The catheter was subsequently withdrawn. Right common femoral artery  angiogram was obtained in right anterior oblique view. The puncture is at the level of the common femoral artery. The artery has normal caliber, adequate for closure device. The sheath was exchanged over the wire for a Perclose prostyle which was utilized  for access closure. Immediate hemostasis was achieved. FINDINGS: 1. Normal caliber of the right common femoral artery, likely for vascular access. 2. Atherosclerotic changes of the left carotid bifurcation without hemodynamically significant stenosis. 3. 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 4. Near occlusive and chronic appearing stenosis of the bilateral P1/PCA segments. PROCEDURE: Using biplane roadmap guidance, a zoom 71 aspiration catheter was navigated over Colossus 35 microguidewire into the cavernous segment of the left ICA. The aspiration catheter was then advanced to the level of occlusion and connected to an aspiration pump. Continuous aspiration was performed for 2 minutes. The guide catheter was connected to a VacLok syringe. The aspiration catheter was subsequently removed under constant aspiration. The guide catheter was aspirated for debris. 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 Using biplane roadmap guidance, a zoom 71 aspiration catheter was navigated over a phenom 21 microcatheter and an Aristotle 14 microguidewire into the cavernous segment of the left ICA. The microcatheter was then navigated over the wire into the left M2/MCA posterior division branch. Then, a 4 x 40 mm solitaire stent retriever was deployed spanning the ICA terminus, M1 and proximal M2 segments. The device was allowed to intercalated with the clot for 4 minutes. The microcatheter was removed. The aspiration catheter was advanced to the level of occlusion and connected to an aspiration pump. The thrombectomy device and aspiration catheter were removed under constant aspiration. The guide catheter was aspirated for debris. Left internal carotid artery angiograms with frontal lateral views of the head showed complete recanalization of the left ICA,  MCA/ACA vascular trees. Flat panel CT of the head was obtained and post processed in a separate workstation with concurrent attending physician supervision. Selected images were sent to PACS. No evidence of hemorrhagic complication. IMPRESSION: Successful mechanical thrombectomy for treatment of a left ICA terminus occlusion with complete recanalization at the 2 passes (TICI 3). PLAN: Patient transferred to ICU for continued post stroke care. Electronically Signed   By: Pedro Earls M.D.   On: 06/04/2022 12:19   DG CHEST PORT 1 VIEW  Result Date: 06/04/2022 CLINICAL DATA:  Endotracheal tube placement. EXAM: PORTABLE CHEST 1 VIEW COMPARISON:  May 10, 2022. FINDINGS: Stable cardiomegaly. Endotracheal and nasogastric tubes are in grossly good position. Lungs are clear. Bony thorax is unremarkable. Status post transcatheter aortic valve repair. IMPRESSION: Endotracheal and nasogastric tubes are in grossly good position. Electronically Signed   By: Marijo Conception M.D.   On: 06/04/2022 11:01   DG Abd 1 View  Result Date: 06/04/2022 CLINICAL DATA:  Enteric tube placement EXAM: ABDOMEN - 1 VIEW COMPARISON:  None Available. FINDINGS: Enteric tube tip projects over the distal stomach. Endotracheal tube tip projects 1.5 cm above the carina. Status post aortic valve replacement. Mitral annular calcifications. Partially imaged lung bases appear clear. Excreted renal contrast within the collecting systems. Paucity of bowel gas in the partially imaged upper abdomen. IMPRESSION: Enteric tube tip projects over the distal stomach. Electronically Signed   By: Darrin Nipper M.D.   On: 06/04/2022 11:00   CT HEAD CODE STROKE WO CONTRAST  Addendum Date: 06/04/2022   ADDENDUM REPORT: 06/04/2022 09:29 ADDENDUM: ASPECTS score on the initial noncontrast head CT is 10. Electronically Signed   By: Valetta Mole M.D.   On: 06/04/2022 09:29   Result Date: 06/04/2022 CLINICAL DATA:  Code stroke.  Neuro deficit  EXAM: CT ANGIOGRAPHY HEAD AND NECK TECHNIQUE: Multidetector CT imaging of the head and neck was performed using the standard protocol during bolus administration of intravenous contrast. Multiplanar CT image reconstructions and MIPs were obtained to evaluate the vascular anatomy. Carotid stenosis measurements (when applicable) are obtained utilizing NASCET criteria, using the distal internal carotid diameter as the denominator. RADIATION DOSE REDUCTION: This exam was performed according to the departmental dose-optimization program which includes automated exposure control, adjustment of the mA and/or kV according to patient size and/or use of iterative reconstruction technique. CONTRAST:  75 cc Omnipaque 350 COMPARISON:  Brain MRI 05/10/2022 FINDINGS: CT HEAD FINDINGS Image quality is degraded by motion artifact. Brain: There is no evidence of acute intracranial hemorrhage, extra-axial fluid collection, or acute territorial infarct. Parenchymal volume is stable. The ventricles are stable in size. Small remote infarcts in the bilateral corona radiata and cerebellar hemispheres are again seen. Additional patchy hypodensity in the supratentorial white matter likely reflects underlying chronic small vessel ischemic change. There is no mass lesion.  There is no mass effect or midline shift. Vascular: See below. Skull: Normal. Negative for fracture or focal lesion. Sinuses/Orbits: The paranasal sinuses are clear. The globes and orbits are unremarkable. Other: None. Review of the MIP images confirms the above findings CTA NECK FINDINGS Aortic arch: There is calcified plaque in the aortic arch. The origins of the major branch vessels are patent. The subclavian arteries are patent to the level imaged. Right carotid system: The right common, internal, and external carotid arteries are patent, with mild plaque of the bifurcation but no hemodynamically significant stenosis or occlusion. There is no dissection or aneurysm. Left  carotid system: The left common, internal, and external carotid arteries are patent with mild plaque of the bifurcation but no hemodynamically significant stenosis or occlusion. There is no dissection or aneurysm. Vertebral arteries: There is calcified plaque of the vertebral artery origins without hemodynamically significant stenosis or occlusion. The left vertebral artery is dominant, a normal variant. There is no hemodynamically significant stenosis or occlusion. The vertebral arteries are patent, with no dissection or aneurysm. Skeleton: There is no acute osseous abnormality or suspicious osseous lesion. Other neck: The soft tissues of the neck are unremarkable. Upper chest: There is a right pleural effusion, incompletely imaged. There is interlobular septal thickening which may reflect pulmonary interstitial edema. Review of the MIP images confirms the above findings CTA HEAD FINDINGS Anterior circulation: The left ICA is occluded at the supraclinoid segment extending to the M1 segment. There is reconstitution of flow in the distal M1 segment with further occlusion of M2 branches in the sylvian fissure (8-104). There is diminutive reconstitution of flow in more distal MCA branches. The right intracranial ICA is patent with calcified plaque but no hemodynamically significant stenosis or occlusion. The right MCA is patent, without proximal high-grade stenosis or occlusion. The bilateral ACAs are patent. There is focal high-grade stenosis of the left A3 segment (8-96). The left A1 segment is likely fills retrograde via the anterior communicating artery. There is no aneurysm or AVM. Posterior circulation: There is calcified plaque in the left V4 segment resulting in moderate stenosis. The non dominant right V4 segment is patent. The basilar artery is patent. The  major cerebellar arteries are patent. Both PCAs appear occluded shortly after their origins with diminutive reconstitution of flow in distal vessels. There  is no aneurysm or AVM. Venous sinuses: As permitted by contrast timing, patent. Anatomic variants: None. Review of the MIP images confirms the above findings IMPRESSION: 1. No definite evidence of evolved left MCA infarct on motion degraded initial noncontrast head CT. 2. 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 There is diminutive reconstitution of flow in more distal MCA branches. 3. Occluded bilateral PCAs shortly after their origins with diminutive reconstitution of flow in distal vessels, favored chronic. 4. Focal high-grade stenosis of the left A3 segment. 5. Calcified plaque at the carotid bifurcations, left worse than right, and the bilateral vertebral artery origins without hemodynamically significant stenosis. 6. Incompletely imaged right pleural effusion and mild pulmonary interstitial edema. Findings communicated to Dr. Rory Percy 8:53 am. Electronically Signed: By: Valetta Mole M.D. On: 06/04/2022 09:09   CT ANGIO HEAD NECK W WO CM (CODE STROKE)  Addendum Date: 06/04/2022   ADDENDUM REPORT: 06/04/2022 09:29 ADDENDUM: ASPECTS score on the initial noncontrast head CT is 10. Electronically Signed   By: Valetta Mole M.D.   On: 06/04/2022 09:29   Result Date: 06/04/2022 CLINICAL DATA:  Code stroke.  Neuro deficit EXAM: CT ANGIOGRAPHY HEAD AND NECK TECHNIQUE: Multidetector CT imaging of the head and neck was performed using the standard protocol during bolus administration of intravenous contrast. Multiplanar CT image reconstructions and MIPs were obtained to evaluate the vascular anatomy. Carotid stenosis measurements (when applicable) are obtained utilizing NASCET criteria, using the distal internal carotid diameter as the denominator. RADIATION DOSE REDUCTION: This exam was performed according to the departmental dose-optimization program which includes automated exposure control,  adjustment of the mA and/or kV according to patient size and/or use of iterative reconstruction technique. CONTRAST:  75 cc Omnipaque 350 COMPARISON:  Brain MRI 05/10/2022 FINDINGS: CT HEAD FINDINGS Image quality is degraded by motion artifact. Brain: There is no evidence of acute intracranial hemorrhage, extra-axial fluid collection, or acute territorial infarct. Parenchymal volume is stable. The ventricles are stable in size. Small remote infarcts in the bilateral corona radiata and cerebellar hemispheres are again seen. Additional patchy hypodensity in the supratentorial white matter likely reflects underlying chronic small vessel ischemic change. There is no mass lesion.  There is no mass effect or midline shift. Vascular: See below. Skull: Normal. Negative for fracture or focal lesion. Sinuses/Orbits: The paranasal sinuses are clear. The globes and orbits are unremarkable. Other: None. Review of the MIP images confirms the above findings CTA NECK FINDINGS Aortic arch: There is calcified plaque in the aortic arch. The origins of the major branch vessels are patent. The subclavian arteries are patent to the level imaged. Right carotid system: The right common, internal, and external carotid arteries are patent, with mild plaque of the bifurcation but no hemodynamically significant stenosis or occlusion. There is no dissection or aneurysm. Left carotid system: The left common, internal, and external carotid arteries are patent with mild plaque of the bifurcation but no hemodynamically significant stenosis or occlusion. There is no dissection or aneurysm. Vertebral arteries: There is calcified plaque of the vertebral artery origins without hemodynamically significant stenosis or occlusion. The left vertebral artery is dominant, a normal variant. There is no hemodynamically significant stenosis or occlusion. The vertebral arteries are patent, with no dissection or aneurysm. Skeleton: There is no  acute osseous  abnormality or suspicious osseous lesion. Other neck: The soft tissues of the neck are unremarkable. Upper chest: There is a right pleural effusion, incompletely imaged. There is interlobular septal thickening which may reflect pulmonary interstitial edema. Review of the MIP images confirms the above findings CTA HEAD FINDINGS Anterior circulation: The left ICA is occluded at the supraclinoid segment extending to the M1 segment. There is reconstitution of flow in the distal M1 segment with further occlusion of M2 branches in the sylvian fissure (8-104). There is diminutive reconstitution of flow in more distal MCA branches. The right intracranial ICA is patent with calcified plaque but no hemodynamically significant stenosis or occlusion. The right MCA is patent, without proximal high-grade stenosis or occlusion. The bilateral ACAs are patent. There is focal high-grade stenosis of the left A3 segment (8-96). The left A1 segment is likely fills retrograde via the anterior communicating artery. There is no aneurysm or AVM. Posterior circulation: There is calcified plaque in the left V4 segment resulting in moderate stenosis. The non dominant right V4 segment is patent. The basilar artery is patent. The major cerebellar arteries are patent. Both PCAs appear occluded shortly after their origins with diminutive reconstitution of flow in distal vessels. There is no aneurysm or AVM. Venous sinuses: As permitted by contrast timing, patent. Anatomic variants: None. Review of the MIP images confirms the above findings IMPRESSION: 1. No definite evidence of evolved left MCA infarct on motion degraded initial noncontrast head CT. 2. 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 There is diminutive reconstitution of flow in more distal MCA branches. 3. Occluded bilateral PCAs shortly after their origins with diminutive  reconstitution of flow in distal vessels, favored chronic. 4. Focal high-grade stenosis of the left A3 segment. 5. Calcified plaque at the carotid bifurcations, left worse than right, and the bilateral vertebral artery origins without hemodynamically significant stenosis. 6. Incompletely imaged right pleural effusion and mild pulmonary interstitial edema. Findings communicated to Dr. Rory Percy 8:53 am. Electronically Signed: By: Valetta Mole M.D. On: 06/04/2022 09:09    Labs:  CBC: Recent Labs    05/21/22 0924 06/04/22 0835 06/04/22 0837 06/04/22 1146 06/05/22 0318 06/06/22 0320  WBC 5.5 8.1  --   --  10.0 7.2  HGB 11.6* 11.6* 11.2* 10.2* 11.1* 10.4*  HCT 34.9* 34.1* 33.0* 30.0* 34.0* 32.3*  PLT 199 166  --   --  174 186    COAGS: Recent Labs    02/06/22 0956 05/10/22 0511 06/04/22 0835 06/05/22 0318  INR 2.2* 1.6* 1.3* 1.2  APTT  --  _0 BMP: Recent Labs    05/21/22 0924 06/04/22 0835 06/04/22 0837 06/04/22 1146 06/05/22 0318 06/06/22 0320  NA 136 141 141 140 141 141  K 4.1 3.7 3.7 3.4* 3.6 3.9  CL 104 109 107  --  110 112*  CO2 26 24  --   --  22 22  GLUCOSE 123* 108* 107*  --  98 92  BUN _1 --  21 15  CALCIUM 8.8* 8.6*  --   --  8.5* 8.4*  CREATININE 1.19* 0.91 0.90  --  0.98 0.89  GFRNONAA 45* >60  --   --  57* >60    LIVER FUNCTION TESTS: Recent Labs    05/10/22 0043 05/13/22 0606 05/21/22 0924 06/04/22 0835  BILITOT 0.5 0.7 0.2* 0.8  AST _0 ALT _1 ALKPHOS 60 53 60 76  PROT 7.0 5.6* 5.7* 5.8*  ALBUMIN 3.9 3.1* 3.1* 3.2*    Assessment and Plan:  81 y.o. female inpatient. History of CHF, HTN, HLD, CKD, a fib (on eliquis) s/p TAVR. Presented to the ED at [facility] on 12.28.23 with AMS, right sided weakness and aphasia.  Found to have a left ICA stroke s/p cerebral angiogram with direct contact aspiration (x1) and combined aspiration and stent retriever (x1) achieving complete recanalization by Dr. Raliegh Ip. [name].    Patient seen at bedside. Husband present.  Able to move all 4 extremities spontaneously. Right upper and lower extremity drift present  but improving compared to yesterday. Patient appears more alert today. She is  able to shake and nod her head appropriately. Expressive aphasia ongoing.    Patient to be seen in [facility]. In 8 weeks time with carotid US. Follow up order placed. IR scheduler will call with appointment date and time. Please call [phone] with any questions or concerns   Patient to stable from [facility] perspective further plans per Stroke Team. Please call IR with questions or concerns.   Electronically Signed: Jacqualine Mau, NP 06/06/2022, 7:38 PM   I spent a total of 15 Minutes at the patient's bedside AND on the patient's hospital floor or unit, greater than 50% of which was counseling/coordinating care for left ICA stroke s/p cerebral angiogram with direct contact aspiration (x1) and combined aspiration and stent retriever (x1) achieving

## 2022-06-06 NOTE — Progress Notes (Signed)
NAME:  Rebekah Pope, MRN:  [id number], DOB:  January 13, 1941, LOS: 2 ADMISSION DATE:  06/04/2022, CONSULTATION DATE:  12/28 REFERRING MD:  Wilford Corner, CHIEF COMPLAINT:  L ICA ischemic stroke   History of Present Illness:  81 y/o female admitted for acute left ICA stroke extending to M1/2 and posterior cerebral arteries, underwent mechanical thrombectomy in IR.  PCCM consulted for vent management.  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 Excell Seltzer & Dr. Laneta Simmers   Severe aortic stenosis    Sinus bradycardia    Stroke (cerebrum) (HCC)      Significant Hospital Events: Including procedures, antibiotic start and stop dates in addition to other pertinent events   12/28 admitted to [facility] ED L MCA ischemic stroke; went to IR for mechanical thrombectomy; intubated postop; PCCM consulted  12/29 MRI brain > few scattered punctate foci of new/acute early ischemia in multiple vascular terribories, supraclinoid L ICA and L MCA patent, prox bilaterl posterior cerebral arteries remain occluded  Interim History / Subjective:   Extubated Seen by cardiology, sinus brady. Does not have atrial fibrillation  Objective   Blood pressure (!) 152/111, pulse (!) 54, temperature 99 F (37.2 C), temperature source Oral, resp. rate (!) 21, weight 87.8 kg, SpO2 92 %.    Vent Mode:  PSV;CPAP FiO2 (%):  [40 %] 40 % Set Rate:  [18 bmp] 18 bmp Vt Set:  [420 mL] 420 mL PEEP:  [5 cmH20] 5 cmH20 Pressure Support:  [5 cmH20] 5 cmH20 Plateau Pressure:  [13 cmH20] 13 cmH20   Intake/Output Summary (Last 24 hours) at 06/06/2022 0865 Last data filed at 06/06/2022 7846 Gross per 24 hour  Intake 832.01 ml  Output 2100 ml  Net -1267.99 ml   Filed Weights   06/04/22 0800  Weight: 87.8 kg    Examination:  General:  Resting comfortably in bed HENT: NCAT OP clear bruising R face PULM: CTA B, normal effort CV: RRR, no mgr GI: BS+, soft, nontender MSK: normal bulk and tone Neuro: awake, alert, no distress, MAEW    Resolved Hospital Problem list     Assessment & Plan:  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   Tolerating extubation well.  Plan: PT/OT/SLP today Advance diet per SLP evaluation Out of bed as tolerated Tele Appreciate cardiology eval Restart anticoagulation when OK by neuro  PCCM will sign off, call if questions  Best Practice (right click and "Reselect all SmartList Selections" daily)   Diet/type: NPO DVT prophylaxis: SCD GI prophylaxis: H2B Lines: N/A Foley:  Yes,  and it is no longer needed Code Status:  full code Last date of multidisciplinary goals of care discussion [per primary]  Critical care time: n/a minutes     Heber Salem, MD [facility] Pager: [phone] Cell: [phone] After 7:00 pm call Elink  [phone]

## 2022-06-06 NOTE — Progress Notes (Addendum)
STROKE TEAM PROGRESS NOTE   INTERVAL HISTORY Her daughter and grandson are at the bedside.  Her daughter had many questions for me in regards to possible recurrence of stroke.  They are okay with resuming Eliquis as there is no atrial fibrillation on Zio patch during her new stroke.    She is sleeping, awakens easily. She follows simple commands, has trouble with 2 step commands. Speech is gabled, has aphasia.  Bilateral uppers 5/5, bilateral lowers 5/5.   Zio patch did not show Afib during stroke event. Etiology thought to be less of an eliquis failure and more of a thromboembolic event. Eliquis restarted.  Cleviprex @ 4mg , will start home hydralazine 25mg  in effort to come off gtt.    Vitals:   06/06/22 1215 06/06/22 1230 06/06/22 1245 06/06/22 1300  BP:   (!) 119/51 (!) 154/58  Pulse: (!) 55 (!) 57 (!) 54 (!) 49  Resp: (!) 22 (!) 27 17 18   Temp:      TempSrc:      SpO2: 94% 95% 95% 95%  Weight:       CBC:  Recent Labs  Lab 06/04/22 0835 06/04/22 0837 06/05/22 0318 06/06/22 0320  WBC 8.1  --  10.0 7.2  NEUTROABS 4.0  --   --   --   HGB 11.6*   < > 11.1* 10.4*  HCT 34.1*   < > 34.0* 32.3*  MCV 88.6  --  89.2 89.7  PLT 166  --  174 186   < > = values in this interval not displayed.    Basic Metabolic Panel:  Recent Labs  Lab 06/05/22 0318 06/06/22 0320  NA 141 141  K 3.6 3.9  CL 110 112*  CO2 22 22  GLUCOSE 98 92  BUN 21 15  CREATININE 0.98 0.89  CALCIUM 8.5* 8.4*  MG 2.0  --     Lipid Panel:  Recent Labs  Lab 06/05/22 0316  CHOL 131  TRIG 74  HDL 65  CHOLHDL 2.0  VLDL 15  LDLCALC 51    HgbA1c: No results for input(s): "HGBA1C" in the last 168 hours. Urine Drug Screen:  Recent Labs  Lab 06/04/22 1029  LABOPIA NONE DETECTED  COCAINSCRNUR NONE DETECTED  LABBENZ NONE DETECTED  AMPHETMU NONE DETECTED  THCU NONE DETECTED  LABBARB NONE DETECTED     Alcohol Level  Recent Labs  Lab 06/04/22 0835  ETH <10    IMAGING past 24 hours No  results found.  PHYSICAL EXAM  Temp:  [98.4 F (36.9 C)-99.3 F (37.4 C)] 98.9 F (37.2 C) (12/30 0800) Pulse Rate:  [47-65] 49 (12/30 1300) Resp:  [12-27] 18 (12/30 1300) BP: (87-167)/(49-135) 154/58 (12/30 1300) SpO2:  [91 %-99 %] 95 % (12/30 1300)  General she is well-developed well-nourished.  No acute distress.  Mental Status -  Awake and alert.  She follows simple commands such as lifting her right arm up, stick her tongue out open close her eyes., has trouble with 2 step commands for example she cannot show me her thumb on her right hand but was able to mimic this action eventually. Right gaze preference but will cross midline briefly.  Right hemianopia, pupils equal round reactive to light. Sensory -intact in all 4 extremities. Motor-she is able to move all 4 extremities off of the bed with no drift. Coordination -no ataxia noted. Gait deferred.  ASSESSMENT/PLAN Rebekah Pope is a 81 y.o. female with history of ecent embolic strokes, sick sinus  syndrome, paroxysmal A-fib on anticoagulation with a DOAC, hypertension, hyperlipidemia, obstructive CAD, HFpEF, severe arctic stenosis status post TAVR in September presenting with sudden onset of unresponsiveness. Last known well is about 7 AM when she woke up and went to the bathroom and upon coming back laid in bed and was noted to be unresponsive by the husband. To IR for emergent mechanical thrombectomy of left ICA terminus occlusion    Stroke: bilateral L>R acute scattered punctate infarcts due to left ICA and MCA occlusion s/p IR with TICI 3 revascularization, etiology likely Afib failed eliquis Code Stroke CT head  No definite evidence of evolved left MCA infarct on motion degraded initial noncontrast head CT. ASPECTS 10.  CTA head & neck Occlusion of the left intracranial ICA at the supraclinoid segment extending to the M1 segment with further occlusion of M2 branches in the sylvian fissure. There is diminutive  reconstitution of flow in more distal MCA branches. Occluded bilateral PCAs shortly after their origins with diminutive reconstitution of flow in distal vessels, favored chronic. Focal high-grade stenosis of the left A3 segment. MRI /MRA - A few scattered punctate foci of new acute/early subacute ischemia within multiple vascular territories. Multiple other recent foci of subacute ischemia as seen on 05/10/2022. The supraclinoid left ICA and the left MCA are now patent. 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 Post IR CT head No thromboembolic or hemorrhagic complication  2D Echo EF 60-65%  LDL 51 HgbA1c 6.0 VTE prophylaxis - Lovenox Eliquis prior to admission, restarted today.  Therapy recommendations:  CIR Disposition:  pending  Sinus Bradycardia  PAF  S/p TAVR 02/10/22 CAD  HFpEF - Zio patch - No AFib has been detected; HRs 37-64bpm - On Eliquis at home, reports compliance with med  - Resume home amio  - Entresto on hold, restart when appropriate per cardio - cardiology on board   Recent stroke  05/12/2022 admitted for nausea vomiting, dizziness, weakness and diplopia.  MRI showed multifocal scattered embolic infarcts.  EF 65 to 70%.  LDL 176, A1c 6.0.  Xarelto switched to Eliquis and put on Lipitor 80 on discharge.  Hypertension Home meds:  norvasc 10mg , hydralazine 25 mg  Stable Long-term BP goal normotensive  Hyperlipidemia Home meds:  atorvastatin 80mg , resumed in hospital LDL 51, goal < 70 Continue statin at discharge  Hypothyroidism  - Restart home synthroid   Other Stroke Risk Factors Advanced Age >/= 40  Obesity, Body mass index is 34.29 kg/m., BMI >/= 30 associated with increased stroke risk, recommend weight loss, diet and exercise as appropriate   Other Active Problems CKD, stage 3 Anemia -HGB 11.1-->10.4. Monitor. Check in am   Hospital day # 2  Pt seen by Neuro NP/APP and later by MD. Note/plan to be edited by MD as  needed.    Otelia Santee, DNP, AGACNP-BC [facility] Please use AMION for pager and EPIC for messaging  ATTENDING ATTESTATION:  81 year old with history of PAF on Eliquis hypertension hyperlipidemia coronary disease CHF status post TAVR on 02/2022 had a recent admission for stroke with left gaze and right-sided weakness with left ICA MCA occlusion and bilateral PCA occlusion.  Status post thrombectomy.  MRI shows scattered punctate bilateral infarcts.  Initially there was some discussion of possible Eliquis failure however there is no atrial fibrillation on Zio patch when the stroke occurred.  This was reviewed by cardiology.  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  At this point I agree with restarting Eliquis which the family agrees to.  Continue statin medication.  She is on low-dose Cleviprex overnight.  Goal systolic blood pressure is less than 160 p.o. medications at home dose hydralazine added and possibly transfer out of the ICU if stable in the afternoon.  Plan was discussed with the patient's daughter and she is in agreement.  Dr. Reeves Forth evaluated pt independently, reviewed imaging, chart, labs. Discussed and formulated plan with the Resident/APP. Changes were made to the note where appropriate. Please see APP/resident note above for details.     This patient is critically ill due to stroke from left ICA and MCA occlusion status post thrombectomy, sinus bradycardia, A-fib and at significant risk of neurological worsening, death form recurrent stroke, hemorrhagic transformation, heart failure, cardiac arrest. This patient's care requires constant monitoring of vital signs, hemodynamics, respiratory and cardiac monitoring, review of multiple databases, neurological assessment, discussion with family, other specialists and medical decision making of high complexity. I spent 40  minutes of neurocritical care time in the care of this patient. I had long discussion with daughter at bedside, updated pt current condition, treatment plan and potential prognosis, and answered all the questions.     Uzziah Rigg,MD     To contact Stroke Continuity provider, please refer to [email or web address] After hours, contact General Neurology

## 2022-06-06 NOTE — Progress Notes (Signed)
Inpatient Rehab Admissions:  Inpatient Rehab Consult received.  I met with patient, daughter and grandson at the bedside for rehabilitation assessment and to discuss goals and expectations of an inpatient rehab admission.  Discussed average length of stay, insurance authorization requirement, discharge home after completion of CIR. Daughter acknowledged understanding. Daughter interested in pt pursuing CIR. Daughter gave permission to contact father/pt's husband. Spoke with Richard on the telephone. He also acknowledged understanding of CIR goals and expectations. He is interested in pt pursuing CIR. He confirmed that he and son would be able to provide 24/7 support for pt after discharge. Will continue to follow.  Signed: Gayland Curry, [place], [place] Admissions Coordinator [phone]

## 2022-06-06 NOTE — PMR Pre-admission (Incomplete)
PMR Admission Coordinator Pre-Admission Assessment  Patient: Rebekah Pope is an 81 y.o., female MRN: [id number] DOB: [birth year] Height:   Weight: 87.8 kg  Insurance Information HMO: ***    PPO: ***     PCP:      IPA:      80/20:      OTHER:  PRIMARY: [employer]      Policy#: [id number]      Subscriber: patient CM Name: ***      Phone#: ***     Fax#: *** Pre-Cert#: ***      Employer: *** Benefits:  Phone #: ***     Name: *** Eff. Date: ***     Deduct: ***      Out of Pocket Max: ***      Life Max: *** CIR: ***      SNF: *** Outpatient: ***     Co-Pay: *** Home Health: ***      Co-Pay: *** DME: ***     Co-Pay: *** Providers: in-network SECONDARY:       Policy#:      Phone#:   Financial Counselor:       Phone#:   The Engineer, petroleum" for patients in Inpatient Rehabilitation Facilities with attached "Privacy Act [place] Records" was provided and verbally reviewed with: {CHL IP Patient Family ZH:[id number]}  Emergency Contact Information Contact Information     Name Relation Home Work Mobile   Whang,RICHARD Spouse [place] Daughter   [phone]   Brinley, Rosete   [phone]   Jimya, Ciani   [phone]       Current Medical History  Patient Admitting Diagnosis: CVA History of Present Illness: Pt is an 81 year old female with medical hx significant for: recent embolic strokes, sick sinus syndrome, paroxysmal A-fib, HTN, hyperlipemia, obstructive CAD, HFpEF, 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 Pt [facility] to [facility] on 06[facility]22 d/t sudden onsent of unresponsiveness. Code stroke activated by EMS. CT unremarkable for bleed. 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 TNK not given. Pt underwent mechanical thrombectomy by Dr. [name] on 06/04/22. Pt  remained intubated after procedure. Extubated 12/29. MRI on 12/29 showed few scattered punctate foci of new acute/early subacute ischemia within multiple vascular territories. Supraclinoid left ICA and left MCA are patent. Cardiology consulted on 12/29; no changes recommended. Therapy evaluations completed and CIR recommended d/t pt's deficits in functional mobility, speech-language deficits, and inability to complete ADLs independently. *** Complete NIHSS TOTAL: 9  Patient's medical record from [facility] has been reviewed by the rehabilitation admission coordinator and physician.  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 Burt Knack & Dr. Cyndia Bent   Severe aortic stenosis    Sinus bradycardia    Stroke (cerebrum) [facility])     Has the patient had major surgery during 100 days prior to admission? Yes  Family History   family history includes Dementia in her mother; Heart disease in her father; Hip fracture in her mother; Pneumonia in her mother.  Current Medications  Current Facility-Administered Medications:    0.9 %  sodium chloride infusion, 250 mL, Intravenous, Continuous, Rollene Rotunda, John D, PA-C   acetaminophen (TYLENOL) tablet 650 mg, 650 mg, Oral, Q4H PRN **OR** acetaminophen (TYLENOL) 160 MG/5ML solution 650 mg, 650 mg, Per Tube, Q4H PRN **OR** acetaminophen  (TYLENOL) suppository 650 mg, 650 mg, Rectal, Q4H PRN, Amie Portland, MD   amiodarone (PACERONE) tablet 100 mg, 100 mg, Oral, Daily, Rosalin Hawking, MD, 100 mg at 06/06/22 3662   apixaban (ELIQUIS) tablet 5 mg, 5 mg, Oral, BID, Beulah Gandy A, NP, 5 mg at 06/06/22 1030   atorvastatin (LIPITOR) tablet 80 mg, 80 mg, Oral, Daily, Mick Sell, PA-C, 80 mg at 06/06/22 9476   Chlorhexidine Gluconate Cloth 2 % PADS 6 each, 6 each, Topical, Daily, Amie Portland, MD, 6 each at 06/05/22 0940   clevidipine (CLEVIPREX) infusion 0.5 mg/mL, 1 mg/hr, Intravenous, Continuous, Greta Doom, MD, Last Rate: 6 mL/hr at 06/06/22 1300, 3 mg/hr at 06/06/22 1300   famotidine (PEPCID) tablet 20 mg, 20 mg, Oral, Daily, Rollene Rotunda, John D, PA-C, 20 mg at 06/06/22 1030   hydrALAZINE (APRESOLINE) injection 10 mg, 10 mg, Intravenous, Q4H PRN, Beulah Gandy A, NP   hydrALAZINE (APRESOLINE) tablet 25 mg, 25 mg, Oral, TID, Beulah Gandy A, NP, 25 mg at 06/06/22 1531   iohexol (OMNIPAQUE) 350 MG/ML injection 75 mL, 75 mL, Intravenous, Once PRN, Amie Portland, MD   levothyroxine (SYNTHROID) tablet 112 mcg, 112 mcg, Per Tube, QAC breakfast, Mick Sell, PA-C, 112 mcg at 06/06/22 5465   Oral care mouth rinse, 15 mL, Mouth Rinse, PRN, Rosalin Hawking, MD   senna-docusate (Senokot-S) tablet 1 tablet, 1 tablet, Oral, QHS PRN, Amie Portland, MD  Patients Current Diet:  Diet Order             Diet Heart Room service appropriate? Yes; Fluid consistency: Thin  Diet effective now                   Precautions / Restrictions Precautions Precautions: Fall, Other (comment) Precaution Comments: mild diplopia with impaired oculomotor function Restrictions Weight Bearing Restrictions: No   Has the patient had 2 or more falls or a fall with injury in the past year? Yes  Prior Activity Level Limited Community (1-2x/wk): doctor's appointments  Prior Functional Level Self Care: Did the patient need help bathing, dressing, using  the toilet or eating? Independent  Indoor Mobility: Did the patient need assistance with walking from room to room (with or without device)? Independent  Stairs: Did the patient need assistance with internal or external stairs (with or without device)? Needed some help  Functional Cognition: Did the patient need help planning regular tasks such as shopping or remembering to take medications? Independent  Patient Information Are you of Hispanic, Latino/a,or Spanish origin?: X. Patient unable to respond, A. No, not of Hispanic, Latino/a, or Spanish origin What is your race?: X. Patient unable to respond, A. White Do you need or want an interpreter to communicate with a doctor or health care staff?: 9. Unable to respond  Patient's Response  To:  Health Literacy and Transportation Is the patient able to respond to health literacy and transportation needs?: No Health Literacy - How often do you need to have someone help you when you read instructions, pamphlets, or other written material from your doctor or pharmacy?: Patient unable to respond (per daughter, pt was able to read medical information independently) In the past 12 months, has lack of transportation kept you from medical appointments or from getting medications?:  (per daughter, pt has not missed any medical appointments d/t transportation issues) In the past 12 months, has lack of transportation kept you from meetings, work, or from getting things needed for daily living?:  (per daughter, pt has not missed any non-medical appointments d/t transportation issues)  Home Assistive Devices / Equipment Home Equipment: Conservation officer, nature (2 wheels), [employer] - single point  Prior Device Use: Indicate devices/aids used by the patient prior to current illness, exacerbation or injury? Walker  Current Functional Level Cognition  Overall Cognitive Status: Difficult to assess Difficult to assess due to: Impaired communication Orientation Level: (P)  Oriented to person, Oriented to place Following Commands: Follows one step commands with increased time    Extremity Assessment (includes Sensation/Coordination)  Upper Extremity Assessment: RUE deficits/detail RUE Deficits / Details: moving out of synergy pattern; gross grasp/release; able to give thumbs up, "ok sign"; extend index finger; ableto hold sock with R hand; weaker proximally, PROM WFL elbow/shoulder. unable to touch hand to mouth in supine or sitting; able to lift R hand off bed @ 30 degrees; abnomral scapular humeral rythm RUE Sensation: decreased light touch RUE Coordination: decreased fine motor, decreased gross motor  Lower Extremity Assessment: Defer to PT evaluation RLE Deficits / Details: grossly 3+/5 LLE Deficits / Details: grossly 4-/5    ADLs  Overall ADL's : Needs assistance/impaired Eating/Feeding: NPO Grooming: Moderate assistance Upper Body Bathing: Moderate assistance Lower Body Bathing: Maximal assistance Upper Body Dressing : Maximal assistance Lower Body Dressing: Total assistance Lower Body Dressing Details (indicate cue type and reason): attmepted to help donn socks at bed leve; appropriate movement pattern however unabl eto reach feet Toileting- Clothing Manipulation and Hygiene: Total assistance Functional mobility during ADLs: +2 for physical assistance, Moderate assistance (to sit EOB; unable to clear hips off bed when attempting to stand)    Mobility  Overal bed mobility: Needs Assistance Bed Mobility: Supine to Sit, Sit to Supine Supine to sit: Mod assist, +2 for physical assistance, HOB elevated Sit to supine: Mod assist, +2 for physical assistance    Transfers  Overall transfer level: Needs assistance Equipment used: 2 person hand held assist Transfers: Sit to/from Stand Sit to Stand: +2 physical assistance, From elevated surface, Max assist    Ambulation / Gait / Stairs / Wheelchair Mobility       Posture / Balance Balance Overall  balance assessment: Needs assistance Sitting-balance support: No upper extremity supported, Feet supported Sitting balance-Leahy Scale: Fair Standing balance support: Bilateral upper extremity supported, Reliant on assistive device for balance Standing balance-Leahy Scale: Poor Standing balance comment: modA x2    Special needs/care consideration Continuous Drip IV  0.9% sodium chloride infusion, Urethral catheter and Skin Surgical incision: groin/right; MASD: anus/medial; Ecchymosis: arm, face/right   Previous Home Environment (from acute therapy documentation) Living Arrangements: Spouse/significant other  Lives With: Spouse Available Help at Discharge: Family, Available 24 hours/day Type of Home: House Home Layout: One level, Laundry or work area in basement Home Access: Stairs to enter Entrance Stairs-Rails: None Technical brewer of Steps: 3  Bathroom Shower/Tub: Chiropodist: Handicapped height Bathroom Accessibility: Yes How Accessible: Accessible via walker Home Care Services: Yes Type of Home Care Services: Home OT, Home PT, [place] (if known): [facility] Additional Comments: live wtih husband, son is 2 miles away  Discharge Living Setting Plans for Discharge Living Setting: Patient's home Type of Home at Discharge: House Discharge Home Layout: One level, Laundry or work area in basement Discharge Home Access: Stairs to enter Entrance Stairs-Rails: None Technical brewer of Steps: 3 Discharge Bathroom Shower/Tub: Tub/shower unit Discharge Bathroom Toilet: Handicapped height Discharge Bathroom Accessibility: Yes How Accessible: Accessible via walker Does the patient have any problems obtaining your medications?: No  Social/Family/Support Systems Patient Roles: Spouse Anticipated Caregiver: Yenty Bloch, husband Anticipated Caregiver's Contact Information: [phone] Caregiver Availability: 24/7 Discharge Plan  Discussed with Primary Caregiver: Yes Is Caregiver In Agreement with Plan?: Yes Does Caregiver/Family have Issues with Lodging/Transportation while Pt is in Rehab?: No  Goals Patient/Family Goal for Rehab: *** Expected length of stay: *** Pt/Family Agrees to Admission and willing to participate: Yes Program Orientation Provided & Reviewed with Pt/Caregiver Including Roles  & Responsibilities: Yes  Decrease burden of Care through IP rehab admission: NA  Possible need for SNF placement upon discharge: Not anticipated  Patient Condition: I have reviewed medical records from [facility], spoken with CM, and patient, spouse, daughter, and family member. I met with patient at the bedside and discussed via phone for inpatient rehabilitation assessment.  Patient will benefit from ongoing PT, OT, and SLP, can actively participate in 3 hours of therapy a day 5 days of the week, and can make measurable gains during the admission.  Patient will also benefit from the coordinated team approach during an Inpatient Acute Rehabilitation admission.  The patient will receive intensive therapy as well as Rehabilitation physician, nursing, social worker, and care management interventions.  Due to bladder management, safety, skin/wound care, disease management, medication administration, pain management, and patient education the patient requires 24 hour a day rehabilitation nursing.  The patient is currently *** with mobility and basic ADLs.  Discharge setting and therapy post discharge at home with home health is anticipated.  Patient has agreed to participate in the Acute Inpatient Rehabilitation Program and will admit {Time; today/tomorrow:10263}.  Preadmission Screen Completed By:  Bethel Born, 06/06/2022 4:25 PM ______________________________________________________________________   Discussed status with Dr. Marland Kitchen on *** at *** and received approval for admission today.  Admission Coordinator:   Bethel Born, CCC-SLP, time ***/Date ***   Assessment/Plan: Diagnosis: Does the need for close, 24 hr/day Medical supervision in concert with the patient's rehab needs make it unreasonable for this patient to be served in a less intensive setting? {yes_no_potentially:3041433} Co-Morbidities requiring supervision/potential complications: *** Due to {due XI:3382505}, does the patient require 24 hr/day rehab nursing? {yes_no_potentially:3041433} Does the patient require coordinated care of a physician, rehab nurse, PT, OT, and SLP to address physical and functional deficits in the context of the above medical diagnosis(es)? {yes_no_potentially:3041433} Addressing deficits in the following areas: {deficits:3041436} Can the patient actively participate in an intensive therapy program of at least 3 hrs of therapy 5 days a week? {yes_no_potentially:3041433} The potential for patient to make measurable gains while on inpatient rehab is {potential:3041437} Anticipated functional outcomes upon discharge from inpatient rehab: {functional outcomes:304600100} PT, {functional outcomes:304600100} OT, {functional outcomes:304600100} SLP Estimated rehab length of stay to reach the above functional goals is: *** Anticipated discharge destination: {anticipated dc setting:21604} 10. Overall  Rehab/Functional Prognosis: {potential:3041437}   MD Signature: ***

## 2022-06-06 NOTE — Progress Notes (Signed)
Rounding Note    Patient Name: Rebekah Pope Date of Encounter: 06/06/2022  [place] Cardiologist: Shirlee More, MD and Berlinda Last  Subjective   Still with some expressive aphasia, improved.   Inpatient Medications    Scheduled Meds:  amiodarone  100 mg Oral Daily   apixaban  5 mg Oral BID   atorvastatin  80 mg Oral Daily   Chlorhexidine Gluconate Cloth  6 each Topical Daily   famotidine  20 mg Oral Daily   hydrALAZINE  25 mg Oral TID   levothyroxine  112 mcg Per Tube QAC breakfast   Continuous Infusions:  sodium chloride     clevidipine 2 mg/hr (06/06/22 0800)   PRN Meds: acetaminophen **OR** acetaminophen (TYLENOL) oral liquid 160 mg/5 mL **OR** acetaminophen, hydrALAZINE, iohexol, mouth rinse, senna-docusate   Vital Signs    Vitals:   06/06/22 0800 06/06/22 0815 06/06/22 0830 06/06/22 0845  BP: (!) 147/49 (!) 151/58 (!) 147/53 (!) 148/57  Pulse: (!) 58 (!) 59 (!) 53 (!) 58  Resp: (!) 24 (!) 22 19 (!) 24  Temp: 98.9 F (37.2 C)     TempSrc: Axillary     SpO2: 94% 92% 92% 92%  Weight:        Intake/Output Summary (Last 24 hours) at 06/06/2022 1037 Last data filed at 06/06/2022 0616 Gross per 24 hour  Intake 583.69 ml  Output 2100 ml  Net -1516.31 ml      06/04/2022    8:00 AM 06/03/2022    2:17 PM 05/18/2022    5:52 AM  Last 3 Weights  Weight (lbs) 193 lb 9 oz 190 lb 186 lb 11.2 oz  Weight (kg) 87.8 kg 86.183 kg 84.687 kg      Telemetry    Sinus brady - Personally Reviewed  ECG    none - Personally Reviewed  Physical Exam   GEN: No acute distress.   Neck: No JVD Cardiac: Reg brady, no murmurs, rubs, or gallops.  Respiratory: Clear to auscultation bilaterally. GI: Soft, nontender, non-distended  MS: No edema; No deformity. Neuro:  Nonfocal  Psych: Normal affect   Labs    High Sensitivity Troponin:   Recent Labs  Lab 05/10/22 0043 05/10/22 0236  TROPONINIHS 105* 114*     Chemistry Recent Labs  Lab  06/04/22 0835 06/04/22 0837 06/04/22 1146 06/05/22 0318 06/06/22 0320  NA 141 141 140 141 141  K 3.7 3.7 3.4* 3.6 3.9  CL 109 107  --  110 112*  CO2 24  --   --  22 22  GLUCOSE 108* 107*  --  98 92  BUN 20 22  --  21 15  CREATININE 0.91 0.90  --  0.98 0.89  CALCIUM 8.6*  --   --  8.5* 8.4*  MG  --   --   --  2.0  --   PROT 5.8*  --   --   --   --   ALBUMIN 3.2*  --   --   --   --   AST 23  --   --   --   --   ALT 19  --   --   --   --   ALKPHOS 76  --   --   --   --   BILITOT 0.8  --   --   --   --   GFRNONAA >60  --   --  57* >60  ANIONGAP 8  --   --  9 7    Lipids  Recent Labs  Lab 06/05/22 0316  CHOL 131  TRIG 74  HDL 65  LDLCALC 51  CHOLHDL 2.0    Hematology Recent Labs  Lab 06/04/22 0835 06/04/22 0837 06/04/22 1146 06/05/22 0318 06/06/22 0320  WBC 8.1  --   --  10.0 7.2  RBC 3.85*  --   --  3.81* 3.60*  HGB 11.6*   < > 10.2* 11.1* 10.4*  HCT 34.1*   < > 30.0* 34.0* 32.3*  MCV 88.6  --   --  89.2 89.7  MCH 30.1  --   --  29.1 28.9  MCHC 34.0  --   --  32.6 32.2  RDW 13.4  --   --  13.7 13.6  PLT 166  --   --  174 186   < > = values in this interval not displayed.   Thyroid  Recent Labs  Lab 06/04/22 0837  TSH 0.926    BNPNo results for input(s): "BNP", "PROBNP" in the last 168 hours.  DDimer No results for input(s): "DDIMER" in the last 168 hours.   Radiology    MR BRAIN WO CONTRAST  Result Date: 06/05/2022 CLINICAL DATA:  Stroke follow-up EXAM: MRI HEAD WITHOUT CONTRAST MRA HEAD WITHOUT CONTRAST TECHNIQUE: Multiplanar, multi-echo pulse sequences of the brain and surrounding structures were acquired without intravenous contrast. Angiographic images of the Circle of Willis were acquired using MRA technique without intravenous contrast. COMPARISON:  05/10/2022 brain MRI 06/04/2022 CTA head neck. FINDINGS: MRI HEAD FINDINGS Brain: Multiple punctate foci of abnormal diffusion restriction within both hemispheres, some of which are new since 05/10/2022  (right corona radiata, left caudate, left mesial temporal lobe). No large area of acute ischemia. No acute hemorrhage or other extra-axial collection. No chronic microhemorrhage. There is multifocal hyperintense T2-weighted signal within the periventricular and deep white matter. Mild generalized atrophy. Midline structures are normal. Old bilateral cerebellar and left corona radiata small vessel infarcts. Vascular: Unchanged abnormal right vertebral artery flow void. The dominant left vertebral artery flow void remains normal. Normal ICA flow voids. Skull and upper cervical spine: Normal marrow signal. Sinuses/Orbits: Negative Other: None MRA HEAD FINDINGS POSTERIOR CIRCULATION: --Vertebral arteries: Left dominant.  Both are patent. --Inferior cerebellar arteries: Normal. --Basilar artery: Normal. --Superior cerebellar arteries: Normal. --Posterior cerebral arteries: Occluded proximally bilaterally. ANTERIOR CIRCULATION: --Intracranial internal carotid arteries: The supraclinoid left ICA is now patent. --Anterior cerebral arteries (ACA): Normal. --Middle cerebral arteries (MCA): Normal flow related enhancement bilaterally. Anatomic variants: None IMPRESSION: 1. A few scattered punctate foci of new acute/early subacute ischemia within multiple vascular territories. Multiple other recent foci of subacute ischemia as seen on 05/10/2022. 2. The supraclinoid left ICA and the left MCA are now patent. 3. 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 Electronically Signed   By: Ulyses Jarred M.D.   On: 06/05/2022 02:00   MR ANGIO HEAD WO CONTRAST  Result Date: 06/05/2022 CLINICAL DATA:  Stroke follow-up EXAM: MRI HEAD WITHOUT CONTRAST MRA HEAD WITHOUT CONTRAST TECHNIQUE: Multiplanar, multi-echo pulse sequences of the brain and surrounding structures were acquired without intravenous contrast. Angiographic images of the Circle of Willis were acquired using MRA technique without intravenous  contrast. COMPARISON:  05/10/2022 brain MRI 06/04/2022 CTA head neck. FINDINGS: MRI HEAD FINDINGS Brain: Multiple punctate foci of abnormal diffusion restriction within both hemispheres, some of which are new since 05/10/2022 (right corona radiata, left caudate, left mesial temporal lobe). No large area of acute ischemia. No acute  hemorrhage or other extra-axial collection. No chronic microhemorrhage. There is multifocal hyperintense T2-weighted signal within the periventricular and deep white matter. Mild generalized atrophy. Midline structures are normal. Old bilateral cerebellar and left corona radiata small vessel infarcts. Vascular: Unchanged abnormal right vertebral artery flow void. The dominant left vertebral artery flow void remains normal. Normal ICA flow voids. Skull and upper cervical spine: Normal marrow signal. Sinuses/Orbits: Negative Other: None MRA HEAD FINDINGS POSTERIOR CIRCULATION: --Vertebral arteries: Left dominant.  Both are patent. --Inferior cerebellar arteries: Normal. --Basilar artery: Normal. --Superior cerebellar arteries: Normal. --Posterior cerebral arteries: Occluded proximally bilaterally. ANTERIOR CIRCULATION: --Intracranial internal carotid arteries: The supraclinoid left ICA is now patent. --Anterior cerebral arteries (ACA): Normal. --Middle cerebral arteries (MCA): Normal flow related enhancement bilaterally. Anatomic variants: None IMPRESSION: 1. A few scattered punctate foci of new acute/early subacute ischemia within multiple vascular territories. Multiple other recent foci of subacute ischemia as seen on 05/10/2022. 2. The supraclinoid left ICA and the left MCA are now patent. 3. 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 Electronically Signed   By: Ulyses Jarred M.D.   On: 06/05/2022 02:00   ECHOCARDIOGRAM COMPLETE  Result Date: 06/04/2022    ECHOCARDIOGRAM REPORT   Patient Name:   DECIMA WIGAND Date of Exam: 06/04/2022 Medical Rec  #:  [id number]            Height:       63.0 in Accession #:    [id number]           Weight:       193.6 lb Date of Birth:  [birth year]             BSA:          1.907 m Patient Age:    81 years             BP:           128/54 mmHg Patient Gender: F                    HR:           40 bpm. Exam Location:  Inpatient Procedure: 2D Echo, Cardiac Doppler and Color Doppler Indications:    Stroke I63.9  History:        Patient has prior history of Echocardiogram examinations, most                 recent 05/11/2022. CHF, CAD, Stroke, Arrythmias:Atrial                 Fibrillation and Bradycardia; Risk Factors:Hypertension and                 Dyslipidemia. CKD, stage III.                  Aortic Valve: valve is present in the aortic position. Procedure                 Date: 02/10/22.  Sonographer:    Ronny Flurry Referring Phys: [id number] ASHISH ARORA IMPRESSIONS  1. Left ventricular ejection fraction, by estimation, is 60 to 65%. The left ventricle has normal function. The left ventricle has no regional wall motion abnormalities. There is moderate concentric left ventricular hypertrophy. Left ventricular diastolic function could not be evaluated.  2. Right ventricular systolic function is normal. The right ventricular size is normal. There is normal pulmonary artery systolic pressure.  3. The mitral valve is degenerative. Mild to moderate mitral valve regurgitation.  No evidence of mitral stenosis. Moderate to severe mitral annular calcification.  4. Tricuspid valve regurgitation is mild to moderate.  5. 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 Aortic valve regurgitation is not visualized. No aortic stenosis is present. There is a valve present in the aortic position. Procedure Date: 02/10/22.  6. The inferior vena cava is dilated in size with <50% respiratory variability, suggesting right atrial pressure of 15 mmHg. FINDINGS  Left Ventricle: Left ventricular ejection fraction, by  estimation, is 60 to 65%. The left ventricle has normal function. The left ventricle has no regional wall motion abnormalities. The left ventricular internal cavity size was normal in size. There is  moderate concentric left ventricular hypertrophy. Abnormal (paradoxical) septal motion, consistent with left bundle branch block. Left ventricular diastolic function could not be evaluated due to mitral annular calcification (moderate or greater). Left ventricular diastolic function could not be evaluated. Right Ventricle: The right ventricular size is normal. No increase in right ventricular wall thickness. Right ventricular systolic function is normal. There is normal pulmonary artery systolic pressure. The tricuspid regurgitant velocity is 2.26 m/s, and  with an assumed right atrial pressure of 15 mmHg, the estimated right ventricular systolic pressure is [id number] mmHg. Left Atrium: Left atrial size was normal in size. Right Atrium: Right atrial size was normal in size. Pericardium: There is no evidence of pericardial effusion. Presence of epicardial fat layer. Mitral Valve: The mitral valve is degenerative in appearance. Moderate to severe mitral annular calcification. Mild to moderate mitral valve regurgitation. No evidence of mitral valve stenosis. Tricuspid Valve: The tricuspid valve is normal in structure. Tricuspid valve regurgitation is mild to moderate. No evidence of tricuspid stenosis. Aortic Valve: S/p TAVR (53mm Medtronic CoreValue-Evolut pro) with well-seated bioprosthetic valve. Aortic valve regurgitation is not visualized. No aortic stenosis is present. Aortic valve mean gradient measures 7.0 mmHg. Aortic valve peak gradient measures 15.8 mmHg. Aortic valve area, by VTI measures 1.81 cm. There is a valve present in the aortic position. Procedure Date: 02/10/22. Pulmonic Valve: The pulmonic valve was normal in structure. Pulmonic valve regurgitation is not visualized. No evidence of pulmonic stenosis. Aorta:  The aortic root is normal in size and structure. Venous: The inferior vena cava is dilated in size with less than 50% respiratory variability, suggesting right atrial pressure of 15 mmHg. IAS/Shunts: No atrial level shunt detected by color flow Doppler.  LEFT VENTRICLE PLAX 2D LVIDd:         4.30 cm   Diastology LVIDs:         2.50 cm   LV e' medial:    4.32 cm/s LV PW:         1.70 cm   LV E/e' medial:  25.2 LV IVS:        1.30 cm   LV e' lateral:   5.54 cm/s LVOT diam:     1.70 cm   LV E/e' lateral: 19.7 LV SV:         101 LV SV Index:   53 LVOT Area:     2.27 cm  RIGHT VENTRICLE             IVC RV S prime:     16.00 cm/s  IVC diam: 2.50 cm TAPSE (M-mode): 2.7 cm LEFT ATRIUM             Index        RIGHT ATRIUM           Index LA  diam:        3.40 cm 1.78 cm/m   RA Area:     17.00 cm LA Vol (A2C):   39.4 ml 20.66 ml/m  RA Volume:   43.50 ml  22.81 ml/m LA Vol (A4C):   56.1 ml 29.42 ml/m LA Biplane Vol: 50.4 ml 26.43 ml/m  AORTIC VALVE AV Area (Vmax):    1.75 cm AV Area (Vmean):   2.01 cm AV Area (VTI):     1.81 cm AV Vmax:           199.00 cm/s AV Vmean:          119.000 cm/s AV VTI:            0.557 m AV Peak Grad:      15.8 mmHg AV Mean Grad:      7.0 mmHg LVOT Vmax:         153.50 cm/s LVOT Vmean:        105.500 cm/s LVOT VTI:          0.444 m LVOT/AV VTI ratio: 0.80  AORTA Ao Root diam: 2.20 cm Ao Asc diam:  2.80 cm MITRAL VALVE                TRICUSPID VALVE MV Area (PHT): 2.69 cm     TR Peak grad:   20.4 mmHg MV Decel Time: 282 msec     TR Vmax:        226.00 cm/s MV E velocity: 109.00 cm/s MV A velocity: 118.50 cm/s  SHUNTS MV E/A ratio:  0.92         Systemic VTI:  0.44 m                             Systemic Diam: 1.70 cm Kardie Tobb DO Electronically signed by Thomasene Ripple DO Signature Date/Time: 06/04/2022/2:31:40 PM    Final    DG CHEST PORT 1 VIEW  Result Date: 06/04/2022 CLINICAL DATA:  Endotracheal tube placement. EXAM: PORTABLE CHEST 1 VIEW COMPARISON:  May 10, 2022. FINDINGS:  Stable cardiomegaly. Endotracheal and nasogastric tubes are in grossly good position. Lungs are clear. Bony thorax is unremarkable. Status post transcatheter aortic valve repair. IMPRESSION: Endotracheal and nasogastric tubes are in grossly good position. Electronically Signed   By: Lupita Raider M.D.   On: 06/04/2022 11:01   DG Abd 1 View  Result Date: 06/04/2022 CLINICAL DATA:  Enteric tube placement EXAM: ABDOMEN - 1 VIEW COMPARISON:  None Available. FINDINGS: Enteric tube tip projects over the distal stomach. Endotracheal tube tip projects 1.5 cm above the carina. Status post aortic valve replacement. Mitral annular calcifications. Partially imaged lung bases appear clear. Excreted renal contrast within the collecting systems. Paucity of bowel gas in the partially imaged upper abdomen. IMPRESSION: Enteric tube tip projects over the distal stomach. Electronically Signed   By: Agustin Cree M.D.   On: 06/04/2022 11:00   CT ANGIO HEAD NECK W WO CM (CODE STROKE)  Addendum Date: 06/04/2022   ADDENDUM REPORT: 06/04/2022 09:29 ADDENDUM: ASPECTS score on the initial noncontrast head CT is 10. Electronically Signed   By: Lesia Hausen M.D.   On: 06/04/2022 09:29   Result Date: 06/04/2022 CLINICAL DATA:  Code stroke.  Neuro deficit EXAM: CT ANGIOGRAPHY HEAD AND NECK TECHNIQUE: Multidetector CT imaging of the head and neck was performed using the standard protocol during bolus administration of intravenous contrast. Multiplanar CT image reconstructions and  MIPs were obtained to evaluate the vascular anatomy. Carotid stenosis measurements (when applicable) are obtained utilizing NASCET criteria, using the distal internal carotid diameter as the denominator. RADIATION DOSE REDUCTION: This exam was performed according to the departmental dose-optimization program which includes automated exposure control, adjustment of the mA and/or kV according to patient size and/or use of iterative reconstruction technique.  CONTRAST:  75 cc Omnipaque 350 COMPARISON:  Brain MRI 05/10/2022 FINDINGS: CT HEAD FINDINGS Image quality is degraded by motion artifact. Brain: There is no evidence of acute intracranial hemorrhage, extra-axial fluid collection, or acute territorial infarct. Parenchymal volume is stable. The ventricles are stable in size. Small remote infarcts in the bilateral corona radiata and cerebellar hemispheres are again seen. Additional patchy hypodensity in the supratentorial white matter likely reflects underlying chronic small vessel ischemic change. There is no mass lesion.  There is no mass effect or midline shift. Vascular: See below. Skull: Normal. Negative for fracture or focal lesion. Sinuses/Orbits: The paranasal sinuses are clear. The globes and orbits are unremarkable. Other: None. Review of the MIP images confirms the above findings CTA NECK FINDINGS Aortic arch: There is calcified plaque in the aortic arch. The origins of the major branch vessels are patent. The subclavian arteries are patent to the level imaged. Right carotid system: The right common, internal, and external carotid arteries are patent, with mild plaque of the bifurcation but no hemodynamically significant stenosis or occlusion. There is no dissection or aneurysm. Left carotid system: The left common, internal, and external carotid arteries are patent with mild plaque of the bifurcation but no hemodynamically significant stenosis or occlusion. There is no dissection or aneurysm. Vertebral arteries: There is calcified plaque of the vertebral artery origins without hemodynamically significant stenosis or occlusion. The left vertebral artery is dominant, a normal variant. There is no hemodynamically significant stenosis or occlusion. The vertebral arteries are patent, with no dissection or aneurysm. Skeleton: There is no acute osseous abnormality or suspicious osseous lesion. Other neck: The soft tissues of the neck are unremarkable. Upper chest:  There is a right pleural effusion, incompletely imaged. There is interlobular septal thickening which may reflect pulmonary interstitial edema. Review of the MIP images confirms the above findings CTA HEAD FINDINGS Anterior circulation: The left ICA is occluded at the supraclinoid segment extending to the M1 segment. There is reconstitution of flow in the distal M1 segment with further occlusion of M2 branches in the sylvian fissure (8-104). There is diminutive reconstitution of flow in more distal MCA branches. The right intracranial ICA is patent with calcified plaque but no hemodynamically significant stenosis or occlusion. The right MCA is patent, without proximal high-grade stenosis or occlusion. The bilateral ACAs are patent. There is focal high-grade stenosis of the left A3 segment (8-96). The left A1 segment is likely fills retrograde via the anterior communicating artery. There is no aneurysm or AVM. Posterior circulation: There is calcified plaque in the left V4 segment resulting in moderate stenosis. The non dominant right V4 segment is patent. The basilar artery is patent. The major cerebellar arteries are patent. Both PCAs appear occluded shortly after their origins with diminutive reconstitution of flow in distal vessels. There is no aneurysm or AVM. Venous sinuses: As permitted by contrast timing, patent. Anatomic variants: None. Review of the MIP images confirms the above findings IMPRESSION: 1. No definite evidence of evolved left MCA infarct on motion degraded initial noncontrast head CT. 2. 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 There is diminutive reconstitution of flow in more distal MCA branches. 3. Occluded bilateral PCAs shortly after their origins with diminutive reconstitution of flow in distal vessels, favored chronic. 4. Focal high-grade stenosis of the left A3 segment. 5.  Calcified plaque at the carotid bifurcations, left worse than right, and the bilateral vertebral artery origins without hemodynamically significant stenosis. 6. Incompletely imaged right pleural effusion and mild pulmonary interstitial edema. Findings communicated to Dr. Wilford Corner 8:53 am. Electronically Signed: By: Lesia Hausen M.D. On: 06/04/2022 09:09    Cardiac Studies   none  Patient Profile     81 y.o. female admitted with a stroke and noted to have sinus brady with LBBB.   Assessment & Plan    Stroke - no evidenc on her zio monitor that she was having atrial fib. Sinus node dysfunction/LBBB - her HR today is in the upper 50's. No indication for PPM based on her ECG today. However, she has had brady in the past. I discussed the treatment options. For now I would recommend watchful waiting and continued supportive care with the stroke.  PAF - she carries this diagnosis and she has not had any atrial fib since the zio monitor was placed.  Coags - ok to continue eliquis if ok from neuro perspective.       For questions or updates, please contact [facility] Please consult [email or web address] for contact info under        Signed, Lewayne Bunting, MD  06/06/2022, 10:37 AM

## 2022-06-07 DIAGNOSIS — I639 Cerebral infarction, unspecified: Secondary | ICD-10-CM | POA: Diagnosis not present

## 2022-06-07 LAB — BASIC METABOLIC PANEL
Anion gap: 9 (ref 5–15)
BUN: 15 mg/dL (ref 8–23)
CO2: 23 mmol/L (ref 22–32)
Calcium: 8.5 mg/dL — ABNORMAL LOW (ref 8.9–10.3)
Chloride: 106 mmol/L (ref 98–111)
Creatinine, Ser: 0.9 mg/dL (ref 0.44–1.00)
GFR, Estimated: >60 mL/min (ref >60)
Glucose, Bld: 96 mg/dL (ref 70–99)
Potassium: 3.7 mmol/L (ref 3.5–5.1)
Sodium: 138 mmol/L (ref 135–145)

## 2022-06-07 LAB — CBC
HCT: 31.5 % — ABNORMAL LOW (ref 36.0–46.0)
Hemoglobin: 10.6 g/dL — ABNORMAL LOW (ref 12.0–15.0)
MCH: 29.6 pg (ref 26.0–34.0)
MCHC: 33.7 g/dL (ref 30.0–36.0)
MCV: 88 fL (ref 80.0–100.0)
Platelets: 182 10*3/uL (ref 150–400)
RBC: 3.58 MIL/uL — ABNORMAL LOW (ref 3.87–5.11)
RDW: 13.5 % (ref 11.5–15.5)
WBC: 6.9 10*3/uL (ref 4.0–10.5)
nRBC: 0 % (ref 0.0–0.2)

## 2022-06-07 LAB — GLUCOSE, CAPILLARY
Glucose-Capillary: 102 mg/dL — ABNORMAL HIGH (ref 70–99)
Glucose-Capillary: 116 mg/dL — ABNORMAL HIGH (ref 70–99)
Glucose-Capillary: 119 mg/dL — ABNORMAL HIGH (ref 70–99)
Glucose-Capillary: 121 mg/dL — ABNORMAL HIGH (ref 70–99)
Glucose-Capillary: 141 mg/dL — ABNORMAL HIGH (ref 70–99)
Glucose-Capillary: 99 mg/dL (ref 70–99)

## 2022-06-07 MED ORDER — DOCUSATE SODIUM 100 MG PO CAPS
100.0000 mg | ORAL_CAPSULE | Freq: Every day | ORAL | Status: DC
  Administered 2022-06-07 – 2022-06-08 (×2): 100 mg via ORAL
  Filled 2022-06-07 (×2): qty 1

## 2022-06-07 MED ORDER — POTASSIUM CHLORIDE CRYS ER 20 MEQ PO TBCR
20.0000 meq | EXTENDED_RELEASE_TABLET | Freq: Two times a day (BID) | ORAL | Status: DC
  Administered 2022-06-07 – 2022-06-11 (×9): 20 meq via ORAL
  Filled 2022-06-07 (×9): qty 1

## 2022-06-07 NOTE — Progress Notes (Addendum)
STROKE TEAM PROGRESS NOTE   INTERVAL HISTORY Daughter is at bedside. Patient is awake and alert. Confused to year and age. Improved speech and ability to follow commands.  Overall doing better than yesterday.  Eliquis restarted 12/30. Cleivprex gtt has been off since 12/30 pm.   Transfer out of ICU.    Vitals:   06/07/22 0600 06/07/22 0800 06/07/22 0900 06/07/22 1100  BP: (!) 166/54 (!) 164/52 (!) 156/51 (!) 133/44  Pulse: (!) 51 (!) 55 (!) 53 (!) 51  Resp: 19 18 19  (!) 26  Temp:  98.9 F (37.2 C)    TempSrc:  Oral    SpO2: 95% 95% 96% 94%  Weight:       CBC:  Recent Labs  Lab 06/04/22 0835 06/04/22 0837 06/06/22 0320 06/07/22 0253  WBC 8.1   < > 7.2 6.9  NEUTROABS 4.0  --   --   --   HGB 11.6*   < > 10.4* 10.6*  HCT 34.1*   < > 32.3* 31.5*  MCV 88.6   < > 89.7 88.0  PLT 166   < > 186 182   < > = values in this interval not displayed.    Basic Metabolic Panel:  Recent Labs  Lab 06/05/22 0318 06/06/22 0320 06/07/22 0253  NA 141 141 138  K 3.6 3.9 3.7  CL 110 112* 106  CO2 22 22 23   GLUCOSE 98 92 96  BUN 21 15 15   CREATININE 0.98 0.89 0.90  CALCIUM 8.5* 8.4* 8.5*  MG 2.0  --   --     Lipid Panel:  Recent Labs  Lab 06/05/22 0316  CHOL 131  TRIG 74  HDL 65  CHOLHDL 2.0  VLDL 15  LDLCALC 51    HgbA1c: No results for input(s): "HGBA1C" in the last 168 hours. Urine Drug Screen:  Recent Labs  Lab 06/04/22 1029  LABOPIA NONE DETECTED  COCAINSCRNUR NONE DETECTED  LABBENZ NONE DETECTED  AMPHETMU NONE DETECTED  THCU NONE DETECTED  LABBARB NONE DETECTED     Alcohol Level  Recent Labs  Lab 06/04/22 0835  ETH <10    IMAGING past 24 hours No results found.  PHYSICAL EXAM  Temp:  [97.7 F (36.5 C)-99.4 F (37.4 C)] 98.9 F (37.2 C) (12/31 0800) Pulse Rate:  [42-59] 51 (12/31 1100) Resp:  [16-27] 26 (12/31 1100) BP: (110-182)/(44-98) 133/44 (12/31 1100) SpO2:  [93 %-98 %] 94 % (12/31 1100)  General she is well-developed  well-nourished.  No acute distress.  Mental Status -  Awake and alert.  Oriented to person and place.  She is a little confused on the name of her daughter.  Confused to year and age.  Able to follow commands. Showed thumb and two fingers without mimic.  Naming intact.  Right hemianopia, pupils equal round reactive to light. Sensory -intact in all 4 extremities. Motor-she is able to move all 4 extremities off of the bed with no drift. Coordination -no ataxia noted. Gait deferred.  ASSESSMENT/PLAN Ms. Nirvi Boehler is a 82 y.o. 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 Last known well is about 7 AM when she woke up and went to the bathroom and upon coming back laid in bed and was noted to be unresponsive by the husband. To IR for emergent mechanical thrombectomy of  left ICA terminus occlusion   Stroke: bilateral L>R acute scattered punctate infarcts due to left ICA and MCA occlusion s/p IR with TICI 3 revascularization, etiology likely Afib failed eliquis Code Stroke CT head  No definite evidence of evolved left MCA infarct on motion degraded initial noncontrast head CT. ASPECTS 10.  CTA head & neck Occlusion of the left intracranial ICA at the supraclinoid segment extending to the M1 segment with further occlusion of M2 branches in the sylvian fissure. There is diminutive reconstitution of flow in more distal MCA branches. Occluded bilateral PCAs shortly after their origins with diminutive reconstitution of flow in distal vessels, favored chronic. Focal high-grade stenosis of the left A3 segment. MRI /MRA - A few scattered punctate foci of new acute/early subacute ischemia within multiple vascular territories. Multiple other recent foci of subacute ischemia as seen  on 05/10/2022. The supraclinoid left ICA and the left MCA are now patent. 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 Post IR CT head No thromboembolic or hemorrhagic complication  2D Echo EF 60-65%  LDL 51 HgbA1c 6.0 VTE prophylaxis - Lovenox Eliquis prior to admission, restarted today.  Therapy recommendations:  CIR Disposition:  pending  Sinus Bradycardia  PAF  S/p TAVR 02/10/22 CAD  HFpEF - Zio patch - No AFib has been detected; HRs 37-64bpm - On Eliquis at home, reports compliance with med  - Resume home amio  - Entresto on hold, restart when appropriate per cardio - cardiology on board  Recent stroke  05/12/2022 admitted for nausea vomiting, dizziness, weakness and diplopia.  MRI showed multifocal scattered embolic infarcts.  EF 65 to 70%.  LDL 176, A1c 6.0.  Xarelto switched to Eliquis and put on Lipitor 80 on discharge.  Hypertension Home meds:  norvasc 10mg , hydralazine 25 mg  Stable Long-term BP goal normotensive  Hyperlipidemia Home meds:  atorvastatin 80mg , resumed in hospital LDL 51, goal < 70 Continue statin at discharge  Hypothyroidism  - Restart home synthroid   Other Stroke Risk Factors Advanced Age >/= 34  Obesity, Body mass index is 34.29 kg/m., BMI >/= 30 associated with increased stroke risk, recommend weight loss, diet and exercise as appropriate   Other Active Problems CKD, stage 3 Anemia -HGB 11.1-->10.4--> 10.6. Monitor. Check in am   Hospital day # 3  Pt seen by Neuro NP/APP and later by MD. Note/plan to be edited by MD as needed.    , DNP, AGACNP-BC [facility] Please use AMION for pager and EPIC for messaging  ATTENDING ATTESTATION:   81 year old with history of PAF on Eliquis hypertension hyperlipidemia coronary disease CHF status post TAVR on 02/2022 had a recent admission for stroke with left gaze and right-sided weakness with left ICA MCA occlusion and bilateral PCA occlusion.   Status post thrombectomy.  MRI shows scattered punctate bilateral infarcts.    Her exam is improved today and she is able to follow commands more consistently without having to mimic.  She is off Cleviprex since yesterday afternoon.  Will transition her to a floor bed later today if she continues to be stable.  Eliquis resumed yesterday.  Patient's daughters questions were answered to her satisfaction.  Continue therapy and awaiting rehab placement.   Dr. 91 evaluated pt independently, reviewed imaging, chart, labs. Discussed and formulated plan with the Resident/APP. Changes were made to the note where appropriate. Please see APP/resident note above for details.     This patient is critically ill due to stroke from left  ICA and MCA occlusion status post thrombectomy, sinus bradycardia, A-fib and at significant risk of neurological worsening, death form recurrent stroke, hemorrhagic transformation, heart failure, cardiac arrest. This patient's care requires constant monitoring of vital signs, hemodynamics, respiratory and cardiac monitoring, review of multiple databases, neurological assessment, discussion with family, other specialists and medical decision making of high complexity. I spent 35 minutes of neurocritical care time in the care of this patient. I had long discussion with daughter at bedside, updated pt current condition, treatment plan and potential prognosis, and answered all the questions.      Yusra Ravert,MD    To contact Stroke Continuity provider, please refer to [email or web address]. After hours, contact General Neurology

## 2022-06-07 NOTE — Progress Notes (Signed)
Pt transferred to [address]. Bedside handoff complete. Belongings and husband, son at bedside.

## 2022-06-08 ENCOUNTER — Encounter (HOSPITAL_COMMUNITY): Payer: Self-pay | Admitting: Student in an Organized Health Care Education/Training Program

## 2022-06-08 ENCOUNTER — Other Ambulatory Visit: Payer: Self-pay | Admitting: Radiology

## 2022-06-08 ENCOUNTER — Other Ambulatory Visit: Payer: Self-pay

## 2022-06-08 DIAGNOSIS — N183 Chronic kidney disease, stage 3 unspecified: Secondary | ICD-10-CM | POA: Diagnosis present

## 2022-06-08 DIAGNOSIS — I639 Cerebral infarction, unspecified: Secondary | ICD-10-CM

## 2022-06-08 DIAGNOSIS — I25810 Atherosclerosis of coronary artery bypass graft(s) without angina pectoris: Secondary | ICD-10-CM | POA: Insufficient documentation

## 2022-06-08 DIAGNOSIS — R001 Bradycardia, unspecified: Secondary | ICD-10-CM | POA: Diagnosis present

## 2022-06-08 DIAGNOSIS — I5030 Unspecified diastolic (congestive) heart failure: Secondary | ICD-10-CM | POA: Diagnosis present

## 2022-06-08 LAB — CBC WITH DIFFERENTIAL/PLATELET
Abs Immature Granulocytes: 0.02 10*3/uL (ref 0.00–0.07)
Basophils Absolute: 0 10*3/uL (ref 0.0–0.1)
Basophils Relative: 1 %
Eosinophils Absolute: 0.5 10*3/uL (ref 0.0–0.5)
Eosinophils Relative: 7 %
HCT: 34.5 % — ABNORMAL LOW (ref 36.0–46.0)
Hemoglobin: 11.5 g/dL — ABNORMAL LOW (ref 12.0–15.0)
Immature Granulocytes: 0 %
Lymphocytes Relative: 15 %
Lymphs Abs: 1.1 10*3/uL (ref 0.7–4.0)
MCH: 29.6 pg (ref 26.0–34.0)
MCHC: 33.3 g/dL (ref 30.0–36.0)
MCV: 88.9 fL (ref 80.0–100.0)
Monocytes Absolute: 0.6 10*3/uL (ref 0.1–1.0)
Monocytes Relative: 8 %
Neutro Abs: 4.8 10*3/uL (ref 1.7–7.7)
Neutrophils Relative %: 69 %
Platelets: 217 10*3/uL (ref 150–400)
RBC: 3.88 MIL/uL (ref 3.87–5.11)
RDW: 13.4 % (ref 11.5–15.5)
WBC: 7 10*3/uL (ref 4.0–10.5)
nRBC: 0 % (ref 0.0–0.2)

## 2022-06-08 LAB — BASIC METABOLIC PANEL
Anion gap: 8 (ref 5–15)
BUN: 16 mg/dL (ref 8–23)
CO2: 25 mmol/L (ref 22–32)
Calcium: 8.6 mg/dL — ABNORMAL LOW (ref 8.9–10.3)
Chloride: 104 mmol/L (ref 98–111)
Creatinine, Ser: 0.95 mg/dL (ref 0.44–1.00)
GFR, Estimated: 59 mL/min — ABNORMAL LOW (ref >60)
Glucose, Bld: 106 mg/dL — ABNORMAL HIGH (ref 70–99)
Potassium: 3.9 mmol/L (ref 3.5–5.1)
Sodium: 137 mmol/L (ref 135–145)

## 2022-06-08 LAB — GLUCOSE, CAPILLARY
Glucose-Capillary: 103 mg/dL — ABNORMAL HIGH (ref 70–99)
Glucose-Capillary: 134 mg/dL — ABNORMAL HIGH (ref 70–99)
Glucose-Capillary: 125 mg/dL — ABNORMAL HIGH (ref 70–99)
Glucose-Capillary: 114 mg/dL — ABNORMAL HIGH (ref 70–99)
Glucose-Capillary: 105 mg/dL — ABNORMAL HIGH (ref 70–99)
Glucose-Capillary: 104 mg/dL — ABNORMAL HIGH (ref 70–99)

## 2022-06-08 MED ORDER — LISINOPRIL 10 MG PO TABS
10.0000 mg | ORAL_TABLET | Freq: Every day | ORAL | Status: DC
  Administered 2022-06-08: 10 mg via ORAL
  Filled 2022-06-08: qty 1

## 2022-06-08 MED ORDER — POLYETHYLENE GLYCOL 3350 17 G PO PACK
17.0000 g | PACK | Freq: Every day | ORAL | Status: DC
  Administered 2022-06-08 – 2022-06-11 (×2): 17 g via ORAL
  Filled 2022-06-08 (×3): qty 1

## 2022-06-08 MED ORDER — DOCUSATE SODIUM 100 MG PO CAPS
100.0000 mg | ORAL_CAPSULE | Freq: Two times a day (BID) | ORAL | Status: DC
  Administered 2022-06-08 – 2022-06-11 (×3): 100 mg via ORAL
  Filled 2022-06-08 (×5): qty 1

## 2022-06-08 NOTE — Care Management Important Message (Signed)
Important Message  Patient Details  Name: Rebekah Pope MRN: [id number] Date of Birth: [birth year]   Medicare Important Message Given:  Yes     Hannah Beat 06/08/2022, 2:50 PM

## 2022-06-08 NOTE — Progress Notes (Addendum)
STROKE TEAM PROGRESS NOTE   INTERVAL HISTORY Daughter is at bedside. Transferred out of ICU 12/31.  Sitting up in bed eating breakfast.  Patient has no specific complaints.  Patient asleep, but arousable to voice. Patient was not able to answer orientation questions correctly. Answers were mostly garbled and non-sensical. When asked her name, she responded with other people's names. Able to follow commands.   Entresto restarted.  Continue PT/OT/ST. Probable CIR disposition.   Vitals:   06/08/22 0626 06/08/22 0643 06/08/22 0812 06/08/22 1114  BP:  (!) 160/51 (!) 157/51 (!) 147/47  Pulse:  (!) 53 (!) 50 (!) 45  Resp:   16 16  Temp:  98.2 F (36.8 C) 98 F (36.7 C) 98 F (36.7 C)  TempSrc:  Oral    SpO2:   95% 97%  Weight: 87.8 kg     Height: 5\' 3"  (1.6 m)      CBC:  Recent Labs  Lab 06/04/22 0835 06/04/22 0837 06/07/22 0253 06/08/22 0833  WBC 8.1   < > 6.9 7.0  NEUTROABS 4.0  --   --  4.8  HGB 11.6*   < > 10.6* 11.5*  HCT 34.1*   < > 31.5* 34.5*  MCV 88.6   < > 88.0 88.9  PLT 166   < > 182 217   < > = values in this interval not displayed.    Basic Metabolic Panel:  Recent Labs  Lab 06/05/22 0318 06/06/22 0320 06/07/22 0253 06/08/22 0833  NA 141   < > 138 137  K 3.6   < > 3.7 3.9  CL 110   < > 106 104  CO2 22   < > 23 25  GLUCOSE 98   < > 96 106*  BUN 21   < > 15 16  CREATININE 0.98   < > 0.90 0.95  CALCIUM 8.5*   < > 8.5* 8.6*  MG 2.0  --   --   --    < > = values in this interval not displayed.    Lipid Panel:  Recent Labs  Lab 06/05/22 0316  CHOL 131  TRIG 74  HDL 65  CHOLHDL 2.0  VLDL 15  LDLCALC 51    HgbA1c: No results for input(s): "HGBA1C" in the last 168 hours. Urine Drug Screen:  Recent Labs  Lab 06/04/22 1029  LABOPIA NONE DETECTED  COCAINSCRNUR NONE DETECTED  LABBENZ NONE DETECTED  AMPHETMU NONE DETECTED  THCU NONE DETECTED  LABBARB NONE DETECTED     Alcohol Level  Recent Labs  Lab 06/04/22 0835  ETH <10     IMAGING past 24 hours No results found.  PHYSICAL EXAM  Temp:  [97.5 F (36.4 C)-98.6 F (37 C)] 98 F (36.7 C) (01/01 1114) Pulse Rate:  [45-53] 45 (01/01 1114) Resp:  [16-20] 16 (01/01 1114) BP: (139-175)/(47-56) 147/47 (01/01 1114) SpO2:  [94 %-99 %] 97 % (01/01 1114) Weight:  [87.8 kg] 87.8 kg (01/01 0626)  General she is well-developed well-nourished.  No acute distress.  Mental Status -  Asleep, arousable to voice. Confused to self, place, time.  Garbled speech, non-sensical words.  She was able to name objects for me today. Able to follow commands.  Right hemianopia, pupils equal round reactive to light. Sensory -intact in all 4 extremities. Motor-nonfocal can move all 4 extremities off bed. Coordination -no ataxia noted. Gait deferred.  ASSESSMENT/PLAN [name] is a 82 y.o. 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 Last known well is about 7 AM when she woke up and went to the bathroom and upon coming back laid in bed and was noted to be unresponsive by the husband. To IR for emergent mechanical thrombectomy of left ICA terminus occlusion   Stroke: bilateral L>R acute scattered punctate infarcts due to left ICA and MCA occlusion s/p IR with TICI 3 revascularization, etiology likely Afib failed eliquis Code Stroke CT head  No definite evidence of evolved left MCA infarct on motion degraded initial noncontrast head CT. ASPECTS 10.  CTA head & neck Occlusion of the left intracranial ICA at the supraclinoid segment extending to the M1 segment with further occlusion of M2 branches in the sylvian fissure. There is diminutive reconstitution of flow in more distal MCA branches. Occluded bilateral PCAs shortly after their origins with  diminutive reconstitution of flow in distal vessels, favored chronic. Focal high-grade stenosis of the left A3 segment. MRI /MRA - A few scattered punctate foci of new acute/early subacute ischemia within multiple vascular territories. Multiple other recent foci of subacute ischemia as seen on 05/10/2022. The supraclinoid left ICA and the left MCA are now patent. 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  MRI showed multifocal scattered embolic infarcts.  EF 65 to 70%.  LDL 176, A1c 6.0.  Xarelto switched to Eliquis and put on Lipitor 80 on discharge.  Hypertension Home meds:  norvasc 10mg , hydralazine 25 mg  Continue current SBP goal < 160 Long-term BP goal normotensive  Hyperlipidemia Home meds:  atorvastatin 80mg , resumed in hospital LDL 51, goal < 70 Continue statin at discharge  Hypothyroidism  - Restart home synthroid   Other Stroke Risk Factors Advanced Age >/= 43  Obesity, Body mass index is 34.29 kg/m., BMI >/= 30 associated with increased stroke risk, recommend weight loss, diet and exercise as appropriate   Other Active Problems CKD, stage 3 Anemia -HGB 11.1-->10.4--> 10.6--> 11.5. Resolved.   Hospital day # 4  Pt seen by Neuro NP/APP and later by MD. Note/plan to be edited by MD as needed.    Otelia Santee, DNP, AGACNP-BC [facility] Please use AMION for pager and EPIC for messaging  ATTENDING  ATTESTATION:  82 year old with history of atrial fibrillation on Eliquis.  Transferred to floor bed yesterday and doing well.  A bit more difficulty with naming objects today than yesterday with nonsensical speech but per daughter she was doing better earlier this morning prior to arrival.  Patient's daughter had many questions about rehab and how long she will be there.  All her questions were answered to her satisfaction.  Dr. Reeves Forth evaluated pt independently, reviewed imaging, chart, labs. Discussed and formulated plan with the Resident/APP. Changes were made to the note where appropriate. Please see APP/resident note above for  details.  Rebekah Allbright,MD   To contact Stroke Continuity provider, please refer to [email or web address]. After hours, contact General Neurology

## 2022-06-08 NOTE — Plan of Care (Signed)
  Problem: Education: Goal: Knowledge of disease or condition will improve Outcome: Progressing Goal: Knowledge of secondary prevention will improve (MUST DOCUMENT ALL) Outcome: Progressing Goal: Knowledge of patient specific risk factors will improve (Mark N/A or DELETE if not current risk factor) Outcome: Progressing   Problem: Ischemic Stroke/TIA Tissue Perfusion: Goal: Complications of ischemic stroke/TIA will be minimized Outcome: Progressing   Problem: Coping: Goal: Will verbalize positive feelings about self Outcome: Progressing Goal: Will identify appropriate support needs Outcome: Progressing   Problem: Health Behavior/Discharge Planning: Goal: Ability to manage health-related needs will improve Outcome: Progressing Goal: Goals will be collaboratively established with patient/family Outcome: Progressing   Problem: Self-Care: Goal: Ability to participate in self-care as condition permits will improve Outcome: Progressing Goal: Verbalization of feelings and concerns over difficulty with self-care will improve Outcome: Progressing Goal: Ability to communicate needs accurately will improve Outcome: Progressing   Problem: Nutrition: Goal: Risk of aspiration will decrease Outcome: Progressing Goal: Dietary intake will improve Outcome: Progressing   Problem: Education: Goal: Understanding of CV disease, CV risk reduction, and recovery process will improve Outcome: Progressing Goal: Individualized Educational Video(s) Outcome: Progressing   Problem: Activity: Goal: Ability to return to baseline activity level will improve Outcome: Progressing   Problem: Cardiovascular: Goal: Ability to achieve and maintain adequate cardiovascular perfusion will improve Outcome: Progressing Goal: Vascular access site(s) Level 0-1 will be maintained Outcome: Progressing   Problem: Health Behavior/Discharge Planning: Goal: Ability to safely manage health-related needs after  discharge will improve Outcome: Progressing   Problem: Education: Goal: Knowledge of General Education information will improve Description: Including pain rating scale, medication(s)/side effects and non-pharmacologic comfort measures Outcome: Progressing   Problem: Health Behavior/Discharge Planning: Goal: Ability to manage health-related needs will improve Outcome: Progressing   Problem: Clinical Measurements: Goal: Ability to maintain clinical measurements within normal limits will improve Outcome: Progressing Goal: Will remain free from infection Outcome: Progressing Goal: Diagnostic test results will improve Outcome: Progressing Goal: Respiratory complications will improve Outcome: Progressing Goal: Cardiovascular complication will be avoided Outcome: Progressing   Problem: Activity: Goal: Risk for activity intolerance will decrease Outcome: Progressing   Problem: Nutrition: Goal: Adequate nutrition will be maintained Outcome: Progressing   Problem: Coping: Goal: Level of anxiety will decrease Outcome: Progressing   Problem: Elimination: Goal: Will not experience complications related to bowel motility Outcome: Progressing Goal: Will not experience complications related to urinary retention Outcome: Progressing   Problem: Pain Managment: Goal: General experience of comfort will improve Outcome: Progressing   Problem: Safety: Goal: Ability to remain free from injury will improve Outcome: Progressing   Problem: Skin Integrity: Goal: Risk for impaired skin integrity will decrease Outcome: Progressing   

## 2022-06-08 NOTE — Discharge Summary (Shared)
Stroke Discharge Summary  Patient ID: Rebekah Pope   MRN: [id number]      DOB: [birth year]  Date of Admission: 06/04/2022 Date of Discharge: 06/11/2022  Attending Physician:  Stroke, Md, MD, Stroke MD Consultant(s):   cardiology Patient's PCP:  Rhea Bleacher, NP  Discharge Diagnoses:  Principal Problem:   Acute ischemic strokes(HCC)-bilateral acute scattered punctate infarcts due to left terminal ICA and MCA occlusion s/p mechanical thrombectomy with TICI 3 revascularization.  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   amiodarone 200 MG tablet Commonly known as: PACERONE Take 0.5 tablets (100 mg total) by mouth daily.   apixaban 5 MG Tabs tablet Commonly known as: ELIQUIS Take 1 tablet (5 mg total) by mouth 2 (two) times daily.   atorvastatin 80 MG tablet Commonly known as: LIPITOR Take 1 tablet (80 mg total) by mouth daily.   Entresto 24-26 MG Generic drug: sacubitril-valsartan Take 1 tablet by mouth 2 (two) times daily.   hydrALAZINE 25 MG tablet Commonly known as: APRESOLINE Take 1 tablet (25 mg total) by mouth 3 (three) times  daily.   levothyroxine 112 MCG tablet Commonly known as: SYNTHROID Take 1 tablet (112 mcg total) by mouth daily before breakfast.        LABORATORY STUDIES CBC    Component Value Date/Time   WBC 7.0 06/08/2022 0833   RBC 3.88 06/08/2022 0833   HGB 11.5 (L) 06/08/2022 0833   HGB 13.0 03/11/2022 1131   HCT 34.5 (L) 06/08/2022 0833   HCT 39.6 03/11/2022 1131   PLT 217 06/08/2022 0833   PLT 221 03/11/2022 1131   MCV 88.9 06/08/2022 0833   MCV 88 03/11/2022 1131   MCH 29.6 06/08/2022 0833   MCHC 33.3 06/08/2022 0833   RDW 13.4 06/08/2022 0833   RDW 13.5 03/11/2022 1131   LYMPHSABS 1.1 06/08/2022 0833   MONOABS 0.6 06/08/2022 0833   EOSABS 0.5 06/08/2022 0833   BASOSABS 0.0 06/08/2022 0833   CMP    Component Value Date/Time   NA 137 06/08/2022 0833   NA 139 03/11/2022 1131   K 3.9 06/08/2022 0833   CL 104 06/08/2022 0833   CO2 25 06/08/2022 0833   GLUCOSE 106 (H) 06/08/2022 0833   BUN 16 06/08/2022 0833   BUN 25 03/11/2022 1131   CREATININE 0.95 06/08/2022 0833   CALCIUM 8.6 (L) 06/08/2022 0833   PROT 5.8 (L) 06/04/2022 0835   ALBUMIN 3.2 (L) 06/04/2022 0835   AST 23 06/04/2022 0835   ALT 19 06/04/2022 0835   ALKPHOS 76 06/04/2022 0835   BILITOT 0.8 06/04/2022  Grant (L) 06/08/2022 0833   COAGS Lab Results  Component Value Date   INR 1.2 06/05/2022   INR 1.3 (H) 06/04/2022   INR 1.6 (H) 05/10/2022   Lipid Panel    Component Value Date/Time   CHOL 131 06/05/2022 0316   TRIG 74 06/05/2022 0316   HDL 65 06/05/2022 0316   CHOLHDL 2.0 06/05/2022 0316   VLDL 15 06/05/2022 0316   LDLCALC 51 06/05/2022 0316   HgbA1C  Lab Results  Component Value Date   HGBA1C 6.0 (H) 05/10/2022   Urinalysis    Component Value Date/Time   COLORURINE STRAW (A) 06/04/2022 1029   APPEARANCEUR CLEAR 06/04/2022 1029   LABSPEC 1.030 06/04/2022 1029   PHURINE 6.0 06/04/2022 1029   GLUCOSEU NEGATIVE 06/04/2022 1029   HGBUR SMALL (A) 06/04/2022 1029    BILIRUBINUR NEGATIVE 06/04/2022 1029   KETONESUR NEGATIVE 06/04/2022 1029   PROTEINUR NEGATIVE 06/04/2022 1029   NITRITE NEGATIVE 06/04/2022 1029   LEUKOCYTESUR SMALL (A) 06/04/2022 1029   Urine Drug Screen     Component Value Date/Time   LABOPIA NONE DETECTED 06/04/2022 1029   COCAINSCRNUR NONE DETECTED 06/04/2022 1029   LABBENZ NONE DETECTED 06/04/2022 1029   AMPHETMU NONE DETECTED 06/04/2022 1029   THCU NONE DETECTED 06/04/2022 1029   LABBARB NONE DETECTED 06/04/2022 1029    Alcohol Level    Component Value Date/Time   ETH <10 06/04/2022 0835     SIGNIFICANT DIAGNOSTIC STUDIES MR BRAIN WO CONTRAST  Result Date: 06/05/2022 CLINICAL DATA:  Stroke follow-up EXAM: MRI HEAD WITHOUT CONTRAST MRA HEAD WITHOUT CONTRAST TECHNIQUE: Multiplanar, multi-echo pulse sequences of the brain and surrounding structures were acquired without intravenous contrast. Angiographic images of the Circle of Willis were acquired using MRA technique without intravenous contrast. COMPARISON:  05/10/2022 brain MRI 06/04/2022 CTA head neck. FINDINGS: MRI HEAD FINDINGS Brain: Multiple punctate foci of abnormal diffusion restriction within both hemispheres, some of which are new since 05/10/2022 (right corona radiata, left caudate, left mesial temporal lobe). No large area of acute ischemia. No acute hemorrhage or other extra-axial collection. No chronic microhemorrhage. There is multifocal hyperintense T2-weighted signal within the periventricular and deep white matter. Mild generalized atrophy. Midline structures are normal. Old bilateral cerebellar and left corona radiata small vessel infarcts. Vascular: Unchanged abnormal right vertebral artery flow void. The dominant left vertebral artery flow void remains normal. Normal ICA flow voids. Skull and upper cervical spine: Normal marrow signal. Sinuses/Orbits: Negative Other: None MRA HEAD FINDINGS POSTERIOR CIRCULATION: --Vertebral arteries: Left dominant.  Both are  patent. --Inferior cerebellar arteries: Normal. --Basilar artery: Normal. --Superior cerebellar arteries: Normal. --Posterior cerebral arteries: Occluded proximally bilaterally. ANTERIOR CIRCULATION: --Intracranial internal carotid arteries: The supraclinoid left ICA is now patent. --Anterior cerebral arteries (ACA): Normal. --Middle cerebral arteries (MCA): Normal flow related enhancement bilaterally. Anatomic variants: None IMPRESSION: 1. A few scattered punctate foci of new acute/early subacute ischemia within multiple vascular territories. Multiple other recent foci of subacute ischemia as seen on 05/10/2022. 2. The supraclinoid left ICA and the left MCA are now patent. 3. 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 Electronically Signed   By: Ulyses Jarred M.D.   On: 06/05/2022 02:00   MR ANGIO HEAD WO CONTRAST  Result Date: 06/05/2022 CLINICAL DATA:  Stroke follow-up EXAM: MRI HEAD WITHOUT CONTRAST MRA HEAD WITHOUT CONTRAST TECHNIQUE: Multiplanar, multi-echo pulse sequences of the brain and surrounding structures were acquired without intravenous contrast. Angiographic images of the Circle of Willis were acquired using  MRA technique without intravenous contrast. COMPARISON:  05/10/2022 brain MRI 06/04/2022 CTA head neck. FINDINGS: MRI HEAD FINDINGS Brain: Multiple punctate foci of abnormal diffusion restriction within both hemispheres, some of which are new since 05/10/2022 (right corona radiata, left caudate, left mesial temporal lobe). No large area of acute ischemia. No acute hemorrhage or other extra-axial collection. No chronic microhemorrhage. There is multifocal hyperintense T2-weighted signal within the periventricular and deep white matter. Mild generalized atrophy. Midline structures are normal. Old bilateral cerebellar and left corona radiata small vessel infarcts. Vascular: Unchanged abnormal right vertebral artery flow void. The dominant left vertebral artery  flow void remains normal. Normal ICA flow voids. Skull and upper cervical spine: Normal marrow signal. Sinuses/Orbits: Negative Other: None MRA HEAD FINDINGS POSTERIOR CIRCULATION: --Vertebral arteries: Left dominant.  Both are patent. --Inferior cerebellar arteries: Normal. --Basilar artery: Normal. --Superior cerebellar arteries: Normal. --Posterior cerebral arteries: Occluded proximally bilaterally. ANTERIOR CIRCULATION: --Intracranial internal carotid arteries: The supraclinoid left ICA is now patent. --Anterior cerebral arteries (ACA): Normal. --Middle cerebral arteries (MCA): Normal flow related enhancement bilaterally. Anatomic variants: None IMPRESSION: 1. A few scattered punctate foci of new acute/early subacute ischemia within multiple vascular territories. Multiple other recent foci of subacute ischemia as seen on 05/10/2022. 2. The supraclinoid left ICA and the left MCA are now patent. 3. 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 Electronically Signed   By: Ulyses Jarred M.D.   On: 06/05/2022 02:00   ECHOCARDIOGRAM COMPLETE  Result Date: 06/04/2022    ECHOCARDIOGRAM REPORT   Patient Name:   Rebekah Pope Date of Exam: 06/04/2022 Medical Rec #:  [id number]            Height:       63.0 in Accession #:    [id number]           Weight:       193.6 lb Date of Birth:  05-30-1941             BSA:          1.907 m Patient Age:    25 years             BP:           128/54 mmHg Patient Gender: F                    HR:           40 bpm. Exam Location:  Inpatient Procedure: 2D Echo, Cardiac Doppler and Color Doppler Indications:    Stroke I63.9  History:        Patient has prior history of Echocardiogram examinations, most                 recent 05/11/2022. CHF, CAD, Stroke, Arrythmias:Atrial                 Fibrillation and Bradycardia; Risk Factors:Hypertension and                 Dyslipidemia. CKD, stage III.                  Aortic Valve: valve is present in the aortic  position. Procedure                 Date: 02/10/22.  Sonographer:    Ronny Flurry Referring Phys: [id number] ASHISH ARORA IMPRESSIONS  1. Left ventricular ejection fraction, by estimation, is 60 to 65%. The left ventricle has normal function. The left ventricle has  no regional wall motion abnormalities. There is moderate concentric left ventricular hypertrophy. Left ventricular diastolic function could not be evaluated.  2. Right ventricular systolic function is normal. The right ventricular size is normal. There is normal pulmonary artery systolic pressure.  3. The mitral valve is degenerative. Mild to moderate mitral valve regurgitation. No evidence of mitral stenosis. Moderate to severe mitral annular calcification.  4. Tricuspid valve regurgitation is mild to moderate.  5. S/p TAVR (2m Medtronic CoreValue-Evolut pro) with well-seated bioprosthetic valve. Aortic valve regurgitation is not visualized. No aortic stenosis is present. There is a valve present in the aortic position. Procedure Date: 02/10/22.  6. The inferior vena cava is dilated in size with <50% respiratory variability, suggesting right atrial pressure of 15 mmHg. FINDINGS  Left Ventricle: Left ventricular ejection fraction, by estimation, is 60 to 65%. The left ventricle has normal function. The left ventricle has no regional wall motion abnormalities. The left ventricular internal cavity size was normal in size. There is  moderate concentric left ventricular hypertrophy. Abnormal (paradoxical) septal motion, consistent with left bundle branch block. Left ventricular diastolic function could not be evaluated due to mitral annular calcification (moderate or greater). Left ventricular diastolic function could not be evaluated. Right Ventricle: The right ventricular size is normal. No increase in right ventricular wall thickness. Right ventricular systolic function is normal. There is normal pulmonary artery systolic pressure. The tricuspid regurgitant  velocity is 2.26 m/s, and  with an assumed right atrial pressure of 15 mmHg, the estimated right ventricular systolic pressure is 336.1mmHg. Left Atrium: Left atrial size was normal in size. Right Atrium: Right atrial size was normal in size. Pericardium: There is no evidence of pericardial effusion. Presence of epicardial fat layer. Mitral Valve: The mitral valve is degenerative in appearance. Moderate to severe mitral annular calcification. Mild to moderate mitral valve regurgitation. No evidence of mitral valve stenosis. Tricuspid Valve: The tricuspid valve is normal in structure. Tricuspid valve regurgitation is mild to moderate. No evidence of tricuspid stenosis. Aortic Valve: S/p TAVR (252mMedtronic CoreValue-Evolut pro) with well-seated bioprosthetic valve. Aortic valve regurgitation is not visualized. No aortic stenosis is present. Aortic valve mean gradient measures 7.0 mmHg. Aortic valve peak gradient measures 15.8 mmHg. Aortic valve area, by VTI measures 1.81 cm. There is a valve present in the aortic position. Procedure Date: 02/10/22. Pulmonic Valve: The pulmonic valve was normal in structure. Pulmonic valve regurgitation is not visualized. No evidence of pulmonic stenosis. Aorta: The aortic root is normal in size and structure. Venous: The inferior vena cava is dilated in size with less than 50% respiratory variability, suggesting right atrial pressure of 15 mmHg. IAS/Shunts: No atrial level shunt detected by color flow Doppler.  LEFT VENTRICLE PLAX 2D LVIDd:         4.30 cm   Diastology LVIDs:         2.50 cm   LV e' medial:    4.32 cm/s LV PW:         1.70 cm   LV E/e' medial:  25.2 LV IVS:        1.30 cm   LV e' lateral:   5.54 cm/s LVOT diam:     1.70 cm   LV E/e' lateral: 19.7 LV SV:         101 LV SV Index:   53 LVOT Area:     2.27 cm  RIGHT VENTRICLE             IVC RV  S prime:     16.00 cm/s  IVC diam: 2.50 cm TAPSE (M-mode): 2.7 cm LEFT ATRIUM             Index        RIGHT ATRIUM            Index LA diam:        3.40 cm 1.78 cm/m   RA Area:     17.00 cm LA Vol (A2C):   39.4 ml 20.66 ml/m  RA Volume:   43.50 ml  22.81 ml/m LA Vol (A4C):   56.1 ml 29.42 ml/m LA Biplane Vol: 50.4 ml 26.43 ml/m  AORTIC VALVE AV Area (Vmax):    1.75 cm AV Area (Vmean):   2.01 cm AV Area (VTI):     1.81 cm AV Vmax:           199.00 cm/s AV Vmean:          119.000 cm/s AV VTI:            0.557 m AV Peak Grad:      15.8 mmHg AV Mean Grad:      7.0 mmHg LVOT Vmax:         153.50 cm/s LVOT Vmean:        105.500 cm/s LVOT VTI:          0.444 m LVOT/AV VTI ratio: 0.80  AORTA Ao Root diam: 2.20 cm Ao Asc diam:  2.80 cm MITRAL VALVE                TRICUSPID VALVE MV Area (PHT): 2.69 cm     TR Peak grad:   20.4 mmHg MV Decel Time: 282 msec     TR Vmax:        226.00 cm/s MV E velocity: 109.00 cm/s MV A velocity: 118.50 cm/s  SHUNTS MV E/A ratio:  0.92         Systemic VTI:  0.44 m                             Systemic Diam: 1.70 cm Kardie Tobb DO Electronically signed by Berniece Salines DO Signature Date/Time: 06/04/2022/2:31:40 PM    Final    IR PERCUTANEOUS ART THROMBECTOMY/INFUSION INTRACRANIAL INC DIAG ANGIO  Result Date: 06/04/2022 INDICATION: 82 year old female with past medical history significant for recent embolic strokes, sick sinus syndrome, paroxysmal A-fib on anticoagulation with a DOAC, hypertension, hyperlipidemia, obstructive CAD, HFpEF, 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 She was brought to ED after being found unresponsive; last known well 7 a.m. on 06/04/2022. Upon evaluation, leftward gaze, right-sided weakness and aphasia were noted; NIHSS. Head CT showed no large acute infarct or hemorrhage. CT angiogram of the head and neck showed a left ICA terminus occlusion. 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 She was brought to our service for emergency mechanical thrombectomy. EXAM: ULTRASOUND-GUIDED VASCULAR ACCESS  DIAGNOSTIC CEREBRAL ANGIOGRAM MECHANICAL THROMBECTOMY FLAT PANEL HEAD CT COMPARISON:  CT/CT angiogram of the head and neck June 04, 2022. MEDICATIONS: 2 g of Ancef IV. The antibiotic was administered within 1 hour of the procedure ANESTHESIA/SEDATION: The procedure was performed under general anesthesia. CONTRAST:  50 mL of Omnipaque 300 milligram/mL FLUOROSCOPY: Radiation Exposure Index (as provided by the fluoroscopic device): 416 mGy Kerma COMPLICATIONS: None immediate. TECHNIQUE: Informed written consent was obtained from the patient's daughter after a thorough  discussion of the procedural risks, benefits and alternatives. All questions were addressed. Maximal Sterile Barrier Technique was utilized including caps, mask, sterile gowns, sterile gloves, sterile drape, hand hygiene and skin antiseptic. A timeout was performed prior to the initiation of the procedure. The right groin was prepped and draped in the usual sterile fashion. Using a micropuncture kit and the modified Seldinger technique, access was gained to the right common femoral artery and an 8 French sheath was placed. Real-time ultrasound guidance was utilized for vascular access including the acquisition of a permanent ultrasound image documenting patency of the accessed vessel. Under fluoroscopy, a Zoom 88 guide catheter was navigated over a 6 [place] VTK catheter and a 0.035" Terumo Glidewire into the aortic arch. The catheter was placed into the left common carotid artery and then advanced into the left internal carotid artery. The diagnostic catheter was removed. Frontal and lateral angiograms of the head were obtained. Mechanical thrombectomy was then carried out as described below. After mechanical thrombectomy, a 5 [place] Berenstein 2 catheter was navigated over a Terumo Glidewire into the aortic arch. The catheter was placed into the left subclavian artery and then advanced into the left vertebral artery. Frontal and lateral angiograms of  the head obtained. The catheter was subsequently withdrawn. Right common femoral artery angiogram was obtained in right anterior oblique view. The puncture is at the level of the common femoral artery. The artery has normal caliber, adequate for closure device. The sheath was exchanged over the wire for a Perclose prostyle which was utilized for access closure. Immediate hemostasis was achieved. FINDINGS: 1. Normal caliber of the right common femoral artery, likely for vascular access. 2. Atherosclerotic changes of the left carotid bifurcation without hemodynamically significant stenosis. 3. 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 4. Near occlusive and chronic appearing stenosis of the bilateral P1/PCA segments. PROCEDURE: Using biplane roadmap guidance, a zoom 71 aspiration catheter was navigated over Colossus 35 microguidewire into the cavernous segment of the left ICA. The aspiration catheter was then advanced to the level of occlusion and connected to an aspiration pump. Continuous aspiration was performed for 2 minutes. The guide catheter was connected to a VacLok syringe. The aspiration catheter was subsequently removed under constant aspiration. The guide catheter was aspirated for debris. 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 Using biplane roadmap guidance, a zoom 71 aspiration catheter was navigated over a phenom 21 microcatheter and an Aristotle 14 microguidewire into the cavernous segment of the left ICA. The microcatheter was then navigated over the wire into the left M2/MCA posterior division branch. Then, a 4 x 40 mm solitaire stent retriever was deployed spanning the ICA terminus, M1 and proximal M2 segments. The device was allowed to intercalated with the clot for 4 minutes. The microcatheter was removed. The aspiration  catheter was advanced to the level of occlusion and connected to an aspiration pump. The thrombectomy device and aspiration catheter were removed under constant aspiration. The guide catheter was aspirated for debris. Left internal carotid artery angiograms with frontal lateral views of the head showed complete recanalization of the left ICA, MCA/ACA vascular trees. Flat panel CT of the head was obtained and post processed in a separate workstation with concurrent attending physician supervision. Selected images were sent to PACS. No evidence of hemorrhagic complication. IMPRESSION: Successful mechanical thrombectomy for treatment of a left  ICA terminus occlusion with complete recanalization at the 2 passes (TICI 3). PLAN: Patient transferred to ICU for continued post stroke care. Electronically Signed   By: Pedro Earls M.D.   On: 06/04/2022 12:19   IR US Guide Vasc Access Right  Result Date: 06/04/2022 INDICATION: 82 year old female with past medical history significant for recent embolic strokes, sick sinus syndrome, paroxysmal A-fib on anticoagulation with a DOAC, hypertension, hyperlipidemia, obstructive CAD, HFpEF, 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 She was brought to ED after being found unresponsive; last known well 7 a.m. on 06/04/2022. Upon evaluation, leftward gaze, right-sided weakness and aphasia were noted; NIHSS. Head CT showed no large acute infarct or hemorrhage. CT angiogram of the head and neck showed a left ICA terminus occlusion. 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 She was brought to our service for emergency mechanical thrombectomy. EXAM: ULTRASOUND-GUIDED VASCULAR ACCESS DIAGNOSTIC CEREBRAL ANGIOGRAM MECHANICAL THROMBECTOMY FLAT PANEL HEAD CT COMPARISON:  CT/CT angiogram of the head and neck June 04, 2022. MEDICATIONS: 2 g of Ancef IV. The antibiotic was administered  within 1 hour of the procedure ANESTHESIA/SEDATION: The procedure was performed under general anesthesia. CONTRAST:  50 mL of Omnipaque 300 milligram/mL FLUOROSCOPY: Radiation Exposure Index (as provided by the fluoroscopic device): 262 mGy Kerma COMPLICATIONS: None immediate. TECHNIQUE: Informed written consent was obtained from the patient's daughter after a thorough discussion of the procedural risks, benefits and alternatives. All questions were addressed. Maximal Sterile Barrier Technique was utilized including caps, mask, sterile gowns, sterile gloves, sterile drape, hand hygiene and skin antiseptic. A timeout was performed prior to the initiation of the procedure. The right groin was prepped and draped in the usual sterile fashion. Using a micropuncture kit and the modified Seldinger technique, access was gained to the right common femoral artery and an 8 French sheath was placed. Real-time ultrasound guidance was utilized for vascular access including the acquisition of a permanent ultrasound image documenting patency of the accessed vessel. Under fluoroscopy, a Zoom 88 guide catheter was navigated over a 6 [place] VTK catheter and a 0.035" Terumo Glidewire into the aortic arch. The catheter was placed into the left common carotid artery and then advanced into the left internal carotid artery. The diagnostic catheter was removed. Frontal and lateral angiograms of the head were obtained. Mechanical thrombectomy was then carried out as described below. After mechanical thrombectomy, a 5 [place] Berenstein 2 catheter was navigated over a Terumo Glidewire into the aortic arch. The catheter was placed into the left subclavian artery and then advanced into the left vertebral artery. Frontal and lateral angiograms of the head obtained. The catheter was subsequently withdrawn. Right common femoral artery angiogram was obtained in right anterior oblique view. The puncture is at the level of the common femoral artery.  The artery has normal caliber, adequate for closure device. The sheath was exchanged over the wire for a Perclose prostyle which was utilized for access closure. Immediate hemostasis was achieved. FINDINGS: 1. Normal caliber of the right common femoral artery, likely for vascular access. 2. Atherosclerotic changes of the left carotid bifurcation without hemodynamically significant stenosis. 3. 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 4. Near occlusive and chronic appearing stenosis of the bilateral P1/PCA segments. PROCEDURE: Using biplane roadmap guidance, a zoom 71 aspiration catheter was navigated over Colossus 35 microguidewire into the cavernous segment of the left ICA. The aspiration catheter was then advanced to the  level of occlusion and connected to an aspiration pump. Continuous aspiration was performed for 2 minutes. The guide catheter was connected to a VacLok syringe. The aspiration catheter was subsequently removed under constant aspiration. The guide catheter was aspirated for debris. 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 Using biplane roadmap guidance, a zoom 71 aspiration catheter was navigated over a phenom 21 microcatheter and an Aristotle 14 microguidewire into the cavernous segment of the left ICA. The microcatheter was then navigated over the wire into the left M2/MCA posterior division branch. Then, a 4 x 40 mm solitaire stent retriever was deployed spanning the ICA terminus, M1 and proximal M2 segments. The device was allowed to intercalated with the clot for 4 minutes. The microcatheter was removed. The aspiration catheter was advanced to the level of occlusion and connected to an aspiration pump. The thrombectomy device and aspiration catheter were removed under constant aspiration. The guide catheter was  aspirated for debris. Left internal carotid artery angiograms with frontal lateral views of the head showed complete recanalization of the left ICA, MCA/ACA vascular trees. Flat panel CT of the head was obtained and post processed in a separate workstation with concurrent attending physician supervision. Selected images were sent to PACS. No evidence of hemorrhagic complication. IMPRESSION: Successful mechanical thrombectomy for treatment of a left ICA terminus occlusion with complete recanalization at the 2 passes (TICI 3). PLAN: Patient transferred to ICU for continued post stroke care. Electronically Signed   By: Pedro Earls M.D.   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 She was brought to ED after being found unresponsive; last known well 7 a.m. on 06/04/2022. Upon evaluation, leftward gaze, right-sided weakness and aphasia were noted; NIHSS. Head CT showed no large acute infarct or hemorrhage. CT angiogram of the head and neck showed a left ICA terminus occlusion. 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 She was brought to our service for emergency mechanical thrombectomy. EXAM: ULTRASOUND-GUIDED VASCULAR ACCESS DIAGNOSTIC CEREBRAL ANGIOGRAM MECHANICAL THROMBECTOMY FLAT PANEL HEAD CT COMPARISON:  CT/CT angiogram of the head and neck June 04, 2022. MEDICATIONS: 2 g of Ancef IV. The antibiotic was administered within 1 hour of the procedure ANESTHESIA/SEDATION: The procedure was performed under general anesthesia. CONTRAST:  50 mL of Omnipaque 300 milligram/mL FLUOROSCOPY: Radiation Exposure Index (as provided by the  fluoroscopic device): 846 mGy Kerma COMPLICATIONS: None immediate. TECHNIQUE: Informed written consent was obtained from the patient's daughter after a thorough discussion of the procedural risks, benefits and alternatives. All questions were addressed. Maximal Sterile Barrier Technique was utilized including caps, mask, sterile gowns, sterile gloves, sterile drape, hand hygiene and skin antiseptic. A timeout was performed prior to the initiation of the procedure. The right groin was prepped and draped in the usual sterile fashion. Using a micropuncture kit and the modified Seldinger technique, access was gained to the right common femoral artery and an 8 French sheath was placed. Real-time ultrasound guidance was utilized for vascular access including the acquisition of a permanent ultrasound image documenting patency of the accessed vessel. Under fluoroscopy, a Zoom  88 guide catheter was navigated over a 6 [place] VTK catheter and a 0.035" Terumo Glidewire into the aortic arch. The catheter was placed into the left common carotid artery and then advanced into the left internal carotid artery. The diagnostic catheter was removed. Frontal and lateral angiograms of the head were obtained. Mechanical thrombectomy was then carried out as described below. After mechanical thrombectomy, a 5 [place] Berenstein 2 catheter was navigated over a Terumo Glidewire into the aortic arch. The catheter was placed into the left subclavian artery and then advanced into the left vertebral artery. Frontal and lateral angiograms of the head obtained. The catheter was subsequently withdrawn. Right common femoral artery angiogram was obtained in right anterior oblique view. The puncture is at the level of the common femoral artery. The artery has normal caliber, adequate for closure device. The sheath was exchanged over the wire for a Perclose prostyle which was utilized for access closure. Immediate hemostasis was achieved. FINDINGS: 1.  Normal caliber of the right common femoral artery, likely for vascular access. 2. Atherosclerotic changes of the left carotid bifurcation without hemodynamically significant stenosis. 3. 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 4. Near occlusive and chronic appearing stenosis of the bilateral P1/PCA segments. PROCEDURE: Using biplane roadmap guidance, a zoom 71 aspiration catheter was navigated over Colossus 35 microguidewire into the cavernous segment of the left ICA. The aspiration catheter was then advanced to the level of occlusion and connected to an aspiration pump. Continuous aspiration was performed for 2 minutes. The guide catheter was connected to a VacLok syringe. The aspiration catheter was subsequently removed under constant aspiration. The guide catheter was aspirated for debris. 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 Using biplane roadmap guidance, a zoom 71 aspiration catheter was navigated over a phenom 21 microcatheter and an Aristotle 14 microguidewire into the cavernous segment of the left ICA. The microcatheter was then navigated over the wire into the left M2/MCA posterior division branch. Then, a 4 x 40 mm solitaire stent retriever was deployed spanning the ICA terminus, M1 and proximal M2 segments. The device was allowed to intercalated with the clot for 4 minutes. The microcatheter was removed. The aspiration catheter was advanced to the level of occlusion and connected to an aspiration pump. The thrombectomy device and aspiration catheter were removed under constant aspiration. The guide catheter was aspirated for debris. Left internal carotid artery angiograms with frontal lateral views of the head showed complete recanalization of the left ICA, MCA/ACA vascular trees. Flat panel CT of the head was obtained and  post processed in a separate workstation with concurrent attending physician supervision. Selected images were sent to PACS. No evidence of hemorrhagic complication. IMPRESSION: Successful mechanical thrombectomy for treatment of a left ICA terminus occlusion with complete recanalization at the 2 passes (TICI 3). PLAN: Patient transferred to ICU for continued post stroke care. Electronically Signed   By: Pedro Earls M.D.   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 She was brought to ED after being found unresponsive; last known well 7  a.m. on 06/04/2022. Upon evaluation, leftward gaze, right-sided weakness and aphasia were noted; NIHSS. Head CT showed no large acute infarct or hemorrhage. CT angiogram of the head and neck showed a left ICA terminus occlusion. 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 She was brought to our service for emergency mechanical thrombectomy. EXAM: ULTRASOUND-GUIDED VASCULAR ACCESS DIAGNOSTIC CEREBRAL ANGIOGRAM MECHANICAL THROMBECTOMY FLAT PANEL HEAD CT COMPARISON:  CT/CT angiogram of the head and neck June 04, 2022. MEDICATIONS: 2 g of Ancef IV. The antibiotic was administered within 1 hour of the procedure ANESTHESIA/SEDATION: The procedure was performed under general anesthesia. CONTRAST:  50 mL of Omnipaque 300 milligram/mL FLUOROSCOPY: Radiation Exposure Index (as provided by the fluoroscopic device): 161 mGy Kerma COMPLICATIONS: None immediate. TECHNIQUE: Informed written consent was obtained from the patient's daughter after a thorough discussion of the procedural  risks, benefits and alternatives. All questions were addressed. Maximal Sterile Barrier Technique was utilized including caps, mask, sterile gowns, sterile gloves, sterile drape, hand hygiene and skin antiseptic. A timeout was performed prior to the initiation of the procedure. The right groin was prepped and draped in the usual sterile fashion. Using a micropuncture kit and the modified Seldinger technique, access was gained to the right common femoral artery and an 8 French sheath was placed. Real-time ultrasound guidance was utilized for vascular access including the acquisition of a permanent ultrasound image documenting patency of the accessed vessel. Under fluoroscopy, a Zoom 88 guide catheter was navigated over a 6 [place] VTK catheter and a 0.035" Terumo Glidewire into the aortic arch. The catheter was placed into the left common carotid artery and then advanced into the left internal carotid artery. The diagnostic catheter was removed. Frontal and lateral angiograms of the head were obtained. Mechanical thrombectomy was then carried out as described below. After mechanical thrombectomy, a 5 [place] Berenstein 2 catheter was navigated over a Terumo Glidewire into the aortic arch. The catheter was placed into the left subclavian artery and then advanced into the left vertebral artery. Frontal and lateral angiograms of the head obtained. The catheter was subsequently withdrawn. Right common femoral artery angiogram was obtained in right anterior oblique view. The puncture is at the level of the common femoral artery. The artery has normal caliber, adequate for closure device. The sheath was exchanged over the wire for a Perclose prostyle which was utilized for access closure. Immediate hemostasis was achieved. FINDINGS: 1. Normal caliber of the right common femoral artery, likely for vascular access. 2. Atherosclerotic changes of the left carotid bifurcation without hemodynamically significant stenosis. 3.  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 4. Near occlusive and chronic appearing stenosis of the bilateral P1/PCA segments. PROCEDURE: Using biplane roadmap guidance, a zoom 71 aspiration catheter was navigated over Colossus 35 microguidewire into the cavernous segment of the left ICA. The aspiration catheter was then advanced to the level of occlusion and connected to an aspiration pump. Continuous aspiration was performed for 2 minutes. The guide catheter was connected to a VacLok syringe. The aspiration catheter was subsequently removed under constant aspiration. The guide catheter was aspirated for debris. 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 Using biplane roadmap guidance, a zoom 71 aspiration catheter was navigated over a phenom 21 microcatheter and an Aristotle 14 microguidewire into the cavernous segment of the left ICA. The microcatheter was  then navigated over the wire into the left M2/MCA posterior division branch. Then, a 4 x 40 mm solitaire stent retriever was deployed spanning the ICA terminus, M1 and proximal M2 segments. The device was allowed to intercalated with the clot for 4 minutes. The microcatheter was removed. The aspiration catheter was advanced to the level of occlusion and connected to an aspiration pump. The thrombectomy device and aspiration catheter were removed under constant aspiration. The guide catheter was aspirated for debris. Left internal carotid artery angiograms with frontal lateral views of the head showed complete recanalization of the left ICA, MCA/ACA vascular trees. Flat panel CT of the head was obtained and post processed in a separate workstation with concurrent attending physician supervision. Selected images were sent to PACS. No evidence of hemorrhagic complication. IMPRESSION: Successful  mechanical thrombectomy for treatment of a left ICA terminus occlusion with complete recanalization at the 2 passes (TICI 3). PLAN: Patient transferred to ICU for continued post stroke care. Electronically Signed   By: Pedro Earls M.D.   On: 06/04/2022 12:19   DG CHEST PORT 1 VIEW  Result Date: 06/04/2022 CLINICAL DATA:  Endotracheal tube placement. EXAM: PORTABLE CHEST 1 VIEW COMPARISON:  May 10, 2022. FINDINGS: Stable cardiomegaly. Endotracheal and nasogastric tubes are in grossly good position. Lungs are clear. Bony thorax is unremarkable. Status post transcatheter aortic valve repair. IMPRESSION: Endotracheal and nasogastric tubes are in grossly good position. Electronically Signed   By: Marijo Conception M.D.   On: 06/04/2022 11:01   DG Abd 1 View  Result Date: 06/04/2022 CLINICAL DATA:  Enteric tube placement EXAM: ABDOMEN - 1 VIEW COMPARISON:  None Available. FINDINGS: Enteric tube tip projects over the distal stomach. Endotracheal tube tip projects 1.5 cm above the carina. Status post aortic valve replacement. Mitral annular calcifications. Partially imaged lung bases appear clear. Excreted renal contrast within the collecting systems. Paucity of bowel gas in the partially imaged upper abdomen. IMPRESSION: Enteric tube tip projects over the distal stomach. Electronically Signed   By: Darrin Nipper M.D.   On: 06/04/2022 11:00   CT HEAD CODE STROKE WO CONTRAST  Addendum Date: 06/04/2022   ADDENDUM REPORT: 06/04/2022 09:29 ADDENDUM: ASPECTS score on the initial noncontrast head CT is 10. Electronically Signed   By: Valetta Mole M.D.   On: 06/04/2022 09:29   Result Date: 06/04/2022 CLINICAL DATA:  Code stroke.  Neuro deficit EXAM: CT ANGIOGRAPHY HEAD AND NECK TECHNIQUE: Multidetector CT imaging of the head and neck was performed using the standard protocol during bolus administration of intravenous contrast. Multiplanar CT image reconstructions and MIPs were obtained to evaluate  the vascular anatomy. Carotid stenosis measurements (when applicable) are obtained utilizing NASCET criteria, using the distal internal carotid diameter as the denominator. RADIATION DOSE REDUCTION: This exam was performed according to the departmental dose-optimization program which includes automated exposure control, adjustment of the mA and/or kV according to patient size and/or use of iterative reconstruction technique. CONTRAST:  75 cc Omnipaque 350 COMPARISON:  Brain MRI 05/10/2022 FINDINGS: CT HEAD FINDINGS Image quality is degraded by motion artifact. Brain: There is no evidence of acute intracranial hemorrhage, extra-axial fluid collection, or acute territorial infarct. Parenchymal volume is stable. The ventricles are stable in size. Small remote infarcts in the bilateral corona radiata and cerebellar hemispheres are again seen. Additional patchy hypodensity in the supratentorial white matter likely reflects underlying chronic small vessel ischemic change. There is no mass lesion.  There is no mass effect or midline shift. Vascular:  See below. Skull: Normal. Negative for fracture or focal lesion. Sinuses/Orbits: The paranasal sinuses are clear. The globes and orbits are unremarkable. Other: None. Review of the MIP images confirms the above findings CTA NECK FINDINGS Aortic arch: There is calcified plaque in the aortic arch. The origins of the major branch vessels are patent. The subclavian arteries are patent to the level imaged. Right carotid system: The right common, internal, and external carotid arteries are patent, with mild plaque of the bifurcation but no hemodynamically significant stenosis or occlusion. There is no dissection or aneurysm. Left carotid system: The left common, internal, and external carotid arteries are patent with mild plaque of the bifurcation but no hemodynamically significant stenosis or occlusion. There is no dissection or aneurysm. Vertebral arteries: There is calcified  plaque of the vertebral artery origins without hemodynamically significant stenosis or occlusion. The left vertebral artery is dominant, a normal variant. There is no hemodynamically significant stenosis or occlusion. The vertebral arteries are patent, with no dissection or aneurysm. Skeleton: There is no acute osseous abnormality or suspicious osseous lesion. Other neck: The soft tissues of the neck are unremarkable. Upper chest: There is a right pleural effusion, incompletely imaged. There is interlobular septal thickening which may reflect pulmonary interstitial edema. Review of the MIP images confirms the above findings CTA HEAD FINDINGS Anterior circulation: The left ICA is occluded at the supraclinoid segment extending to the M1 segment. There is reconstitution of flow in the distal M1 segment with further occlusion of M2 branches in the sylvian fissure (8-104). There is diminutive reconstitution of flow in more distal MCA branches. The right intracranial ICA is patent with calcified plaque but no hemodynamically significant stenosis or occlusion. The right MCA is patent, without proximal high-grade stenosis or occlusion. The bilateral ACAs are patent. There is focal high-grade stenosis of the left A3 segment (8-96). The left A1 segment is likely fills retrograde via the anterior communicating artery. There is no aneurysm or AVM. Posterior circulation: There is calcified plaque in the left V4 segment resulting in moderate stenosis. The non dominant right V4 segment is patent. The basilar artery is patent. The major cerebellar arteries are patent. Both PCAs appear occluded shortly after their origins with diminutive reconstitution of flow in distal vessels. There is no aneurysm or AVM. Venous sinuses: As permitted by contrast timing, patent. Anatomic variants: None. Review of the MIP images confirms the above findings IMPRESSION: 1. No definite evidence of evolved left MCA infarct on motion degraded initial  noncontrast head CT. 2. 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 There is diminutive reconstitution of flow in more distal MCA branches. 3. Occluded bilateral PCAs shortly after their origins with diminutive reconstitution of flow in distal vessels, favored chronic. 4. Focal high-grade stenosis of the left A3 segment. 5. Calcified plaque at the carotid bifurcations, left worse than right, and the bilateral vertebral artery origins without hemodynamically significant stenosis. 6. Incompletely imaged right pleural effusion and mild pulmonary interstitial edema. Findings communicated to Dr. Rory Percy 8:53 am. Electronically Signed: By: Valetta Mole M.D. On: 06/04/2022 09:09   CT ANGIO HEAD NECK W WO CM (CODE STROKE)  Addendum Date: 06/04/2022   ADDENDUM REPORT: 06/04/2022 09:29 ADDENDUM: ASPECTS score on the initial noncontrast head CT is 10. Electronically Signed   By: Valetta Mole M.D.   On: 06/04/2022 09:29   Result Date: 06/04/2022 CLINICAL DATA:  Code stroke.  Neuro deficit EXAM: CT ANGIOGRAPHY HEAD AND NECK TECHNIQUE: Multidetector CT imaging of the head and neck was performed using the standard protocol during bolus administration of intravenous contrast. Multiplanar CT image reconstructions and MIPs were obtained to evaluate the vascular anatomy. Carotid stenosis measurements (when applicable) are obtained utilizing NASCET criteria, using the distal internal carotid diameter as the denominator. RADIATION DOSE REDUCTION: This exam was performed according to the departmental dose-optimization program which includes automated exposure control, adjustment of the mA and/or kV according to patient size and/or use of iterative reconstruction technique. CONTRAST:  75 cc Omnipaque 350 COMPARISON:  Brain MRI 05/10/2022 FINDINGS: CT HEAD FINDINGS Image quality is degraded by motion artifact. Brain: There  is no evidence of acute intracranial hemorrhage, extra-axial fluid collection, or acute territorial infarct. Parenchymal volume is stable. The ventricles are stable in size. Small remote infarcts in the bilateral corona radiata and cerebellar hemispheres are again seen. Additional patchy hypodensity in the supratentorial white matter likely reflects underlying chronic small vessel ischemic change. There is no mass lesion.  There is no mass effect or midline shift. Vascular: See below. Skull: Normal. Negative for fracture or focal lesion. Sinuses/Orbits: The paranasal sinuses are clear. The globes and orbits are unremarkable. Other: None. Review of the MIP images confirms the above findings CTA NECK FINDINGS Aortic arch: There is calcified plaque in the aortic arch. The origins of the major branch vessels are patent. The subclavian arteries are patent to the level imaged. Right carotid system: The right common, internal, and external carotid arteries are patent, with mild plaque of the bifurcation but no hemodynamically significant stenosis or occlusion. There is no dissection or aneurysm. Left carotid system: The left common, internal, and external carotid arteries are patent with mild plaque of the bifurcation but no hemodynamically significant stenosis or occlusion. There is no dissection or aneurysm. Vertebral arteries: There is calcified plaque of the vertebral artery origins without hemodynamically significant stenosis or occlusion. The left vertebral artery is dominant, a normal variant. There is no hemodynamically significant stenosis or occlusion. The vertebral arteries are patent, with no dissection or aneurysm. Skeleton: There is no acute osseous abnormality or suspicious osseous lesion. Other neck: The soft tissues of the neck are unremarkable. Upper chest: There is a right pleural effusion, incompletely imaged. There is interlobular septal thickening which may reflect pulmonary interstitial edema. Review  of the MIP images confirms the above findings CTA HEAD FINDINGS Anterior circulation: The left ICA is occluded at the supraclinoid segment extending to the M1 segment. There is reconstitution of flow in the distal M1 segment with further occlusion of M2 branches in the sylvian fissure (8-104). There is diminutive reconstitution of flow in more distal MCA branches. The right intracranial ICA is patent with calcified plaque but no hemodynamically significant stenosis or occlusion. The right MCA is patent, without proximal high-grade stenosis or occlusion. The bilateral ACAs are patent. There is focal high-grade stenosis of the left A3 segment (8-96). The left A1 segment is likely fills retrograde via the anterior communicating artery. There is no aneurysm or AVM. Posterior circulation: There is calcified plaque in the left V4 segment resulting in moderate stenosis. The non dominant right V4 segment is patent. The basilar artery is patent. The major cerebellar arteries are patent. Both PCAs appear occluded shortly after their origins with diminutive reconstitution of flow in distal vessels. There is no aneurysm or AVM. Venous sinuses: As permitted by contrast timing, patent. Anatomic variants: None. Review of the MIP images confirms the  above findings IMPRESSION: 1. No definite evidence of evolved left MCA infarct on motion degraded initial noncontrast head CT. 2. 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 There is diminutive reconstitution of flow in more distal MCA branches. 3. Occluded bilateral PCAs shortly after their origins with diminutive reconstitution of flow in distal vessels, favored chronic. 4. Focal high-grade stenosis of the left A3 segment. 5. Calcified plaque at the carotid bifurcations, left worse than right, and the bilateral vertebral artery origins without hemodynamically significant  stenosis. 6. Incompletely imaged right pleural effusion and mild pulmonary interstitial edema. Findings communicated to Dr. Rory Percy 8:53 am. Electronically Signed: By: Valetta Mole M.D. On: 06/04/2022 09:09   LONG TERM MONITOR-LIVE TELEMETRY (3-14 DAYS)  Result Date: 05/17/2022 Patch Wear Time:  14 days and 0 hours (2023-11-13T11:32:34-0500 to 2023-11-27T11:32:34-0500) Patient had a min HR of 35 bpm, max HR of 129 bpm, and avg HR of 42 bpm. Predominant underlying rhythm was Sinus Rhythm. Bundle Branch Block/IVCD was present. There were no pauses of 3 seconds or greater and no episodes of second or third-degree AV block. There was 1 triggered event sinus bradycardia 40 bpm 5 Supraventricular Tachycardia runs occurred, the run with the fastest interval lasting 4 beats with a max rate of 129 bpm, the longest lasting 7 beats with an avg rate of 101 bpm. Isolated SVEs were rare (<1.0%), SVE Couplets were rare (<1.0%), and no SVE Triplets were present.  There were no episodes of atrial fibrillation or flutter Isolated VEs were rare (<1.0%, 4598), VE Couplets were rare (<1.0%, 2), and VE Triplets were rare (<1.0%, 5). Ventricular Bigeminy was present.       HISTORY OF PRESENT ILLNESS [name] is a 82 y.o. 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 Last known well is about 7 AM when she woke up and went to the bathroom and upon coming back laid in bed and was noted to be unresponsive by the husband. To IR for emergent mechanical thrombectomy of left ICA terminus occlusion    HOSPITAL COURSE Stroke: bilateral L>R acute scattered punctate infarcts due to left ICA and MCA occlusion s/p IR with TICI 3 revascularization, etiology likely Afib failed eliquis Code Stroke CT head   No definite evidence of evolved left MCA infarct on motion degraded initial noncontrast head CT. ASPECTS 10.  CTA head & neck Occlusion of the left intracranial ICA at the supraclinoid segment extending to the M1 segment with further occlusion of M2 branches in the sylvian fissure. There is diminutive reconstitution of flow in more distal MCA branches. Occluded bilateral PCAs shortly after their origins with diminutive reconstitution of flow in distal vessels, favored chronic. Focal high-grade stenosis of the left A3 segment. MRI /MRA - A few scattered punctate foci of new acute/early subacute ischemia within multiple vascular territories. Multiple other recent foci of subacute ischemia as seen on 05/10/2022. The supraclinoid left ICA and the left MCA are now patent. 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  MRI showed multifocal scattered embolic infarcts.  EF 65 to 70%.  LDL 176, A1c 6.0.  Xarelto switched to Eliquis and put on Lipitor 80 on discharge.   Hypertension Home meds:  norvasc 15m, hydralazine 25 mg  Continue current SBP goal < 160 Hydralazine restarted, norvasc held due to bradycardia  Long-term BP goal normotensive   Hyperlipidemia Home meds:  atorvastatin 855m resumed in hospital LDL 51, goal < 70 Continue statin at discharge   Hypothyroidism  - Restart home synthroid    Other Stroke Risk Factors Advanced Age >/= 6566Obesity, Body mass index is 34.29 kg/m., BMI >/= 30 associated with increased stroke risk, recommend  weight loss, diet and exercise as appropriate    Other Active Problems CKD, stage 3- stable   DISCHARGE EXAM Blood pressure (!) 141/69, pulse (!) 49, temperature 98 F (36.7 C), resp. rate 18, height _0  (1.6 m), weight 87.8 kg, SpO2 100 %.  Physical Exam  Constitutional: Appears well-developed and well-nourished in no apparent distress. Psych: Affect appropriate to situation Cardiovascular: Normal rate and regular rhythm.  Respiratory: Unlabored breathing GI: Soft.  No distension. There is no tenderness.  Skin: WDI. Scattered bruising.   Neuro: Mental Status: Patient is awake, alert, oriented to person, place, and situation. Confused to time.  Aphasia present. Speech is nonfluent, positive for word hesitancy. Follows commands. Cranial Nerves:  VF intact and full, EOMI, PERRL, No ptosis, Facial sensation and movements symmetric. Hearing intact to voice. Shoulder shrug symmetric. Tongue protrusion midline.  Motor: MAE. 4+ in all extremities.  Sensory: intact and symmetrical throughout Coordination: no tremor noted. Decreased fine motor/FNT in right hand; orbiting seen.  Gait: deferred.    Discharge Diet      Diet   Diet Heart Room service appropriate? Yes; Fluid consistency: Thin   liquids  DISCHARGE PLAN Disposition:  Transfer to [place] ongoing PT, OT and ST Continue home Eliquis (apixaban) daily for secondary stroke prevention, continue home atorvastatin, hydralzaine for stroke risk factor modification Recommend ongoing stroke risk factor control by Primary Care Physician at time of discharge from inpatient rehabilitation. Follow-up PCP HoRhea BleacherNP in 2 weeks following discharge from rehab. [facility] in 8 weeks following discharge from rehab, office to schedule an appointment.   45 minutes were spent preparing discharge.  Pt seen by Neuro NP/APP and later by MD. Note/plan to be edited by MD  as needed.    ErOtelia SanteeDNP, AGACNP-BC [facility] Please use AMION for pager and EPIC for messaging  I have personally obtained history,examined this patient, reviewed notes, independently viewed imaging studies, participated in medical decision making and plan of care.ROS completed by me personally and pertinent positives fully documented  I have made any additions or clarifications directly to the above note. Agree with note above.    PrAntony ContrasMD Medical Director [facility] Pager: [phone]/09/2022 2:47 PM

## 2022-06-09 DIAGNOSIS — I639 Cerebral infarction, unspecified: Secondary | ICD-10-CM | POA: Diagnosis not present

## 2022-06-09 LAB — GLUCOSE, CAPILLARY
Glucose-Capillary: 104 mg/dL — ABNORMAL HIGH (ref 70–99)
Glucose-Capillary: 129 mg/dL — ABNORMAL HIGH (ref 70–99)
Glucose-Capillary: 106 mg/dL — ABNORMAL HIGH (ref 70–99)
Glucose-Capillary: 109 mg/dL — ABNORMAL HIGH (ref 70–99)

## 2022-06-09 MED ORDER — SACUBITRIL-VALSARTAN 24-26 MG PO TABS
1.0000 | ORAL_TABLET | Freq: Two times a day (BID) | ORAL | Status: DC
  Administered 2022-06-09 – 2022-06-11 (×4): 1 via ORAL
  Filled 2022-06-09 (×4): qty 1

## 2022-06-09 NOTE — Progress Notes (Signed)
Inpatient Rehab Admissions Coordinator:  Saw pt at bedside. Informed her that insurance authorization started. Called husband to inform him. No one answered; left a message.   Gayland Curry, [place], [place] Admissions Coordinator [phone]

## 2022-06-09 NOTE — TOC Progression Note (Signed)
Transition of Care [facility]) - Progression Note    Patient Details  Name: Rebekah Pope MRN: [id number] Date of Birth: 06-25-1940  [facility]) CM/SW Contact  Pollie Friar, RN Phone Number: 06/09/2022, 3:49 PM  Clinical Narrative:    CIR has started insurance auth for rehab admission. TOC following.   Expected Discharge Plan: [place] Barriers to Discharge: Continued Medical Work up  Expected Discharge Plan and Services     Post Acute Care Choice: IP Rehab                                         Social Determinants of Health (SDOH) Interventions SDOH Screenings   Food Insecurity: No Food Insecurity (06/08/2022)  Housing: Low Risk  (06/08/2022)  Transportation Needs: No Transportation Needs (06/08/2022)  Utilities: Not At Risk (06/08/2022)  Depression (PHQ2-9): Low Risk  (06/03/2022)  Tobacco Use: Low Risk  (06/08/2022)    Readmission Risk Interventions    02/11/2022   11:09 AM  Readmission Risk Prevention Plan  Post Dischage Appt Complete  Medication Screening Complete  Transportation Screening Complete

## 2022-06-09 NOTE — Progress Notes (Addendum)
STROKE TEAM PROGRESS NOTE   INTERVAL HISTORY Patient's daughter is at the bedside. Patient's neurologic exam is stable, VSS.  Pending CIR placement.  On exam, patient remains aphasic but is able to state her name and her daughter's names correctly.  Attempts repetition with multiple errors. Naming and speech impaired.   Vitals:   06/08/22 1957 06/08/22 2322 06/09/22 0352 06/09/22 0533  BP: (!) 175/67 (!) 131/46 (!) 171/62 (!) 157/66  Pulse: (!) 44 (!) 49 (!) 49   Resp: 16 18 16    Temp: 98.8 F (37.1 C) 98.7 F (37.1 C) 98.4 F (36.9 C)   TempSrc: Oral Axillary Oral   SpO2: 96% 97% 94%   Weight:      Height:       CBC:  Recent Labs  Lab 06/04/22 0835 06/04/22 0837 06/07/22 0253 06/08/22 0833  WBC 8.1   < > 6.9 7.0  NEUTROABS 4.0  --   --  4.8  HGB 11.6*   < > 10.6* 11.5*  HCT 34.1*   < > 31.5* 34.5*  MCV 88.6   < > 88.0 88.9  PLT 166   < > 182 217   < > = values in this interval not displayed.    Basic Metabolic Panel:  Recent Labs  Lab 06/05/22 0318 06/06/22 0320 06/07/22 0253 06/08/22 0833  NA 141   < > 138 137  K 3.6   < > 3.7 3.9  CL 110   < > 106 104  CO2 22   < > 23 25  GLUCOSE 98   < > 96 106*  BUN 21   < > 15 16  CREATININE 0.98   < > 0.90 0.95  CALCIUM 8.5*   < > 8.5* 8.6*  MG 2.0  --   --   --    < > = values in this interval not displayed.    Lipid Panel:  Recent Labs  Lab 06/05/22 0316  CHOL 131  TRIG 74  HDL 65  CHOLHDL 2.0  VLDL 15  LDLCALC 51    HgbA1c: No results for input(s): "HGBA1C" in the last 168 hours. Urine Drug Screen:  Recent Labs  Lab 06/04/22 1029  LABOPIA NONE DETECTED  COCAINSCRNUR NONE DETECTED  LABBENZ NONE DETECTED  AMPHETMU NONE DETECTED  THCU NONE DETECTED  LABBARB NONE DETECTED     Alcohol Level  Recent Labs  Lab 06/04/22 0835  ETH <10    IMAGING past 24 hours No results found.  PHYSICAL EXAM  Temp:  [97.6 F (36.4 C)-98.8 F (37.1 C)] 98.4 F (36.9 C) (01/02 0352) Pulse Rate:  [44-50]  49 (01/02 0352) Resp:  [16-18] 16 (01/02 0352) BP: (131-175)/(46-67) 157/66 (01/02 0533) SpO2:  [94 %-98 %] 94 % (01/02 0352)  General she is well-developed well-nourished.  No acute distress.  Mental Status -  Awake and alert, sitting up in bedside recliner with daughter at bedside. She is able to state her name but is unable to identify place or time on assessment.   Nonfluent speech with significant word hesitancy. Aphasia.  Able to follow commands.  Right hemianopia, pupils equal round reactive to light. Sensory -intact in all 4 extremities. Motor-nonfocal can move all 4 extremities off bed. Coordination -no ataxia noted. Gait deferred.  ASSESSMENT/PLAN [name] is a 82 y.o. female with history of ecent embolic strokes, sick sinus syndrome, paroxysmal A-fib on anticoagulation with a DOAC, hypertension, hyperlipidemia, obstructive CAD, HFpEF, severe arctic stenosis status post  TAVR in September presenting with sudden onset of unresponsiveness. Last known well is about 7 AM when she woke up and went to the bathroom and upon coming back laid in bed and was noted to be unresponsive by the husband. To IR for emergent mechanical thrombectomy of left ICA terminus occlusion.   Stroke: bilateral L>R acute scattered punctate infarcts due to left ICA and MCA occlusion s/p IR with TICI 3 revascularization, etiology likely Afib failed eliquis Code Stroke CT head  No definite evidence of evolved left MCA infarct on motion degraded initial noncontrast head CT. ASPECTS 10.  CTA head & neck Occlusion of the left intracranial ICA at the supraclinoid segment extending to the M1 segment with further occlusion of M2 branches in the sylvian fissure. There is diminutive reconstitution of flow in more distal MCA branches. Occluded bilateral PCAs shortly after their origins with diminutive reconstitution of flow in distal vessels, favored chronic. Focal high-grade stenosis of the left A3  segment. MRI /MRA - A few scattered punctate foci of new acute/early subacute ischemia within multiple vascular territories. Multiple other recent foci of subacute ischemia as seen on 05/10/2022. The supraclinoid left ICA and the left MCA are now patent. 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  MRI showed multifocal scattered embolic infarcts.  EF 65 to 70%.  LDL 176, A1c 6.0.  Xarelto switched to Eliquis and put on Lipitor 80 on discharge.  Hypertension Home meds:  norvasc 10mg , hydralazine 25 mg  Continue current SBP goal < 160 Long-term BP goal normotensive  Hyperlipidemia Home meds:  atorvastatin 80mg , resumed in hospital LDL 51, goal < 70 Continue statin at discharge  Hypothyroidism  - Restart home synthroid   Other Stroke Risk Factors Advanced Age >/= 38  Obesity, Body mass index is 34.29 kg/m., BMI >/= 30 associated with increased stroke risk, recommend weight loss, diet and exercise as appropriate   Other Active Problems CKD, stage 3 Anemia -HGB 11.1-->10.4--> 10.6--> 11.5. Resolved.   Hospital day # 5 Anibal Henderson, AGACNP-BC [facility] Pager: [phone]  I have personally obtained history,examined this patient, reviewed notes, independently viewed imaging studies, participated in medical decision making and plan of care.ROS completed by me personally and pertinent positives fully documented   I have made any additions or clarifications directly to the above note. Agree with note above.  Patient remains aphasic and recommend continue ongoing therapies and Eliquis.  Await rehab decision likely transfer to inpatient rehab in the next few days when bed available.  Long discussion with patient and daughter at the bedside and answered questions.  Discussed with rehab coordinator.  Greater than 50% time during this 35-minute visit was spent in counseling and coordination of care about her embolic stroke, atrial fibrillation and need for rehab and answering questions.  Antony Contras, MD Medical Director [facility] Pager: [phone] 06/09/2022 1:36 PM   To contact Stroke Continuity provider, please refer to [email or web address] After  hours, contact General Neurology

## 2022-06-09 NOTE — Progress Notes (Signed)
Late entry: Spoke with [employer] rep/technical support Friday late day. There is a potential of more data on the zio monitor itself, not uploaded to cloud storage that would include the few days prior to her admission  Discussed Friday with Dr. Erlinda Hong and Dr. Angelena Form. No changes planned for management. D/w pt's RN she had already given the monitor to the family and I/she had already advised they return the monitor. [employer] has the device on an expedited read in their system.  Tommye Standard, PA-C

## 2022-06-09 NOTE — Progress Notes (Signed)
Physical Therapy Treatment Patient Details Name: Rebekah Pope MRN: [id number] DOB: 03-Aug-1940 Today's Date: 06/09/2022   History of Present Illness 82 y.o. female presents [facility] hospital on 06/04/2022 with sudden onset unresponsiveness. CTA revealed occlusion of L ICA. Pt underwent IR thrombectomy on 12/28, remaining intubated post-procedure. PMHx: complex regional pain syndrome, essential HTN, HLD, hypothyroidism, a fib, TVAR, embolic strokes.    PT Comments    Pt greeted supine and agreeable to PT/OT session with continued progress towards acute goals. Pt able complete bed mobility with mod a +2 and come to stand to RW with up to mod a +2 for multiple trials and maintain for ~60 seconds each trial. Pt needing consistent multimodal cues during standing for upright posture. Pt able to step pivot bed>BSC>recliner with mod a +2 for safety and agreeable to time up in chair at end of session. Pt daughter present and supportive throughout. Current plan remains appropriate to address deficits and maximize functional independence and decrease caregiver burden. Pt continues to benefit from skilled PT services to progress toward functional mobility goals.    Recommendations for follow up therapy are one component of a multi-disciplinary discharge planning process, led by the attending physician.  Recommendations may be updated based on patient status, additional functional criteria and insurance authorization.  Follow Up Recommendations  Acute inpatient rehab (3hours/day)     Assistance Recommended at Discharge Frequent or constant Supervision/Assistance  Patient can return home with the following Two people to help with walking and/or transfers;Two people to help with bathing/dressing/bathroom;Assistance with cooking/housework;Assistance with feeding;Direct supervision/assist for medications management;Assist for transportation;Help with stairs or ramp for entrance;Direct supervision/assist for  financial management   Equipment Recommendations  Wheelchair (measurements PT);BSC/3in1    Recommendations for Other Services       Precautions / Restrictions Precautions Precautions: Fall;Other (comment) Precaution Comments: mild diplopia with impaired oculomotor function Restrictions Weight Bearing Restrictions: No     Mobility  Bed Mobility Overal bed mobility: Needs Assistance Bed Mobility: Supine to Sit     Supine to sit: Mod assist, +2 for physical assistance     General bed mobility comments: use of bed pad and helicopter method to get EOB    Transfers Overall transfer level: Needs assistance Equipment used: Rolling walker (2 wheels) Transfers: Sit to/from Stand, Bed to chair/wheelchair/BSC Sit to Stand: Mod assist, +2 physical assistance   Step pivot transfers: Mod assist, +2 physical assistance       General transfer comment: perfromed x2 to [facility] and to chair    Ambulation/Gait                   Stairs             Wheelchair Mobility    Modified Rankin (Stroke Patients Only) Modified Rankin (Stroke Patients Only) Pre-Morbid Rankin Score: Moderate disability Modified Rankin: Moderately severe disability     Balance Overall balance assessment: Needs assistance Sitting-balance support: No upper extremity supported, Feet supported Sitting balance-Leahy Scale: Fair Sitting balance - Comments: sits unsupported without LOB   Standing balance support: Bilateral upper extremity supported, Reliant on assistive device for balance Standing balance-Leahy Scale: Poor Standing balance comment: reliant on external support                            Cognition Arousal/Alertness: Awake/alert Behavior During Therapy: Flat affect Overall Cognitive Status: Difficult to assess (aphasic) Area of Impairment: Following commands, Problem solving  Following Commands: Follows one step commands with increased  time     Problem Solving: Slow processing, Decreased initiation, Requires verbal cues, Difficulty sequencing, Requires tactile cues          Exercises Other Exercises Other Exercises: warm up seated marching x10, LAQ x10    General Comments General comments (skin integrity, edema, etc.): VSS on RA, daughter present during session      Pertinent Vitals/Pain Pain Assessment Pain Assessment: No/denies pain    Home Living     Available Help at Discharge: Family;Available 24 hours/day Type of Home: House                  Prior Function            PT Goals (current goals can now be found in the care plan section) Acute Rehab PT Goals PT Goal Formulation: With patient/family Time For Goal Achievement: 06/19/22 Progress towards PT goals: Progressing toward goals    Frequency    Min 4X/week      PT Plan      Co-evaluation PT/OT/SLP Co-Evaluation/Treatment: Yes Reason for Co-Treatment: Complexity of the patient's impairments (multi-system involvement);For patient/therapist safety;To address functional/ADL transfers PT goals addressed during session: Mobility/safety with mobility;Proper use of DME;Balance OT goals addressed during session: ADL's and self-care      AM-PAC PT "6 Clicks" Mobility   Outcome Measure  Help needed turning from your back to your side while in a flat bed without using bedrails?: A Lot Help needed moving from lying on your back to sitting on the side of a flat bed without using bedrails?: A Lot Help needed moving to and from a bed to a chair (including a wheelchair)?: A Lot Help needed standing up from a chair using your arms (e.g., wheelchair or bedside chair)?: Total Help needed to walk in hospital room?: Total Help needed climbing 3-5 steps with a railing? : Total 6 Click Score: 9    End of Session Equipment Utilized During Treatment: Gait belt Activity Tolerance: Patient tolerated treatment well Patient left: in chair;with  call bell/phone within reach;with chair alarm set;with family/visitor present Nurse Communication: Mobility status PT Visit Diagnosis: Other abnormalities of gait and mobility (R26.89);Other symptoms and signs involving the nervous system (R29.898);Muscle weakness (generalized) (M62.81)     Time: 1594-5859 PT Time Calculation (min) (ACUTE ONLY): 28 min  Charges:  $Therapeutic Activity: 8-22 mins                     Makaio Mach R. PTA Acute Rehabilitation Services Office: [place] 06/09/2022, 10:28 AM

## 2022-06-09 NOTE — Evaluation (Signed)
Speech Language Pathology Evaluation Patient Details Name: Rebekah Pope MRN: [id number] DOB: [birth year] Today's Date: 06/09/2022 Time: 0827-0907 SLP Time Calculation (min) (ACUTE ONLY): 40 min  Problem List:  Patient Active Problem List   Diagnosis Date Noted   CKD (chronic kidney disease) stage 3, GFR 30-59 ml/min ([place]) 06/08/2022   (HFpEF) heart failure with preserved ejection fraction ([place]) 06/08/2022   CAD (coronary artery disease) of artery bypass graft 06/08/2022   Sinus bradycardia 06/08/2022   Acute ischemic stroke ([place]) 06/04/2022   Ischemic cerebrovascular accident (CVA) ([place]) 05/12/2022   Elevated troponin 05/10/2022   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   Severe aortic stenosis 10/13/2021   Abnormal electrocardiogram (ECG) (EKG) 10/09/2021   Allergic rhinitis 10/09/2021   Essential (primary) hypertension 10/09/2021   Hyperlipidemia 10/09/2021   Hypothyroidism 10/09/2021   Paroxysmal atrial fibrillation ([place]) 10/09/2021   Past Medical History:  Past Medical History:  Diagnosis Date   Allergic rhinitis    CAD (coronary artery disease)    Chronic diastolic CHF (congestive heart failure) (HCC)    Chronic kidney disease, stage 3b (HCC)    Complex regional pain syndrome i of unspecified lower limb    Dizziness and giddiness    Essential (primary) hypertension    History of hiatal hernia    Hyperlipidemia    Hypothyroidism    LBBB (left bundle branch block)    Localized edema    Paroxysmal atrial fibrillation (HCC)    PONV (postoperative nausea and vomiting)    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 Burt Knack & Dr. Cyndia Bent   Severe aortic stenosis    Sinus bradycardia    Stroke (cerebrum) [facility])    Past Surgical History:  Past Surgical History:  Procedure Laterality Date   CESAREAN SECTION     INTRAOPERATIVE TRANSTHORACIC ECHOCARDIOGRAM N/A 02/10/2022    Procedure: INTRAOPERATIVE TRANSTHORACIC ECHOCARDIOGRAM;  Surgeon: Sherren Mocha, MD;  Location: [place] [facility];  Service: Open Heart Surgery;  Laterality: N/A;   IR ANGIO VERTEBRAL SEL VERTEBRAL UNI L MOD SED  06/04/2022   IR CT HEAD LTD  06/04/2022   IR PERCUTANEOUS ART THROMBECTOMY/INFUSION INTRACRANIAL INC DIAG ANGIO  06/04/2022   IR US GUIDE VASC ACCESS RIGHT  06/04/2022   RADIOLOGY WITH ANESTHESIA N/A 06/04/2022   Procedure: IR WITH ANESTHESIA;  Surgeon: Radiologist, Medication, MD;  Location: [place];  Service: Radiology;  Laterality: N/A;   RIGHT HEART CATH AND CORONARY ANGIOGRAPHY N/A 10/31/2021   Procedure: RIGHT HEART CATH AND CORONARY ANGIOGRAPHY;  Surgeon: Burnell Blanks, MD;  Location: [place] [facility];  Service: Cardiovascular;  Laterality: N/A;   TRANSCATHETER AORTIC VALVE REPLACEMENT, TRANSFEMORAL N/A 02/10/2022   Procedure: Transcatheter Aortic Valve Replacement, Transfemoral;  Surgeon: Sherren Mocha, MD;  Location: [place] [facility];  Service: Open Heart Surgery;  Laterality: N/A;   HPI:  Pt is an 82 y.o. female who presented on 06/04/2022 with sudden onset unresponsiveness. CTA revealed occlusion of L ICA. Pt underwent IR thrombectomy on 12/28, ETT 12/28-12/29. PMH: complex regional pain syndrome, essential HTN, HLD, hypothyroidism, a fib, TVAR, embolic strokes. Pt evaluated by SLP on inpatient rehab on 05/14/22 and cognition was [facility] at that time.   Assessment / Plan / Recommendation Clinical Impression  Pt participated in speech-language evaluation with her daughter present. She described the pt as "very smart" and denied her having any baseline deficits in speech, language, or cognition.  Pt presents with moderate fluent aphasia. Receptively she was able to follow most one-step commands and answer simple yes/no questions. However, she exhibited difficulty with auditory comprehension beyond these levels and with sentence-level reading comprehension. Pt was able to  participate in simple conversation; utterances were fluent, but with reduced meaning and with impairments in syntax. Multiple neologistic errors and phonemic paraphasias were noted during attempts at word retrieval and sentence formulation. Articulatory precision and vocal intensity were mildly reduced suggestive of dysarthria. Skilled SLP services are clinically indicated at this time.    SLP Assessment  SLP Recommendation/Assessment: Patient needs continued Speech Lanaguage Pathology Services SLP Visit Diagnosis: Aphasia (R47.01);Dysarthria and anarthria (R47.1)    Recommendations for follow up therapy are one component of a multi-disciplinary discharge planning process, led by the attending physician.  Recommendations may be updated based on patient status, additional functional criteria and insurance authorization.    Follow Up Recommendations  Acute inpatient rehab (3hours/day)    Assistance Recommended at Discharge     Functional Status Assessment Patient has had a recent decline in their functional status and demonstrates the ability to make significant improvements in function in a reasonable and predictable amount of time.  Frequency and Duration min 2x/week  2 weeks      SLP Evaluation Cognition  Overall Cognitive Status: Difficult to assess (due to aphasia)       Comprehension  Auditory Comprehension Overall Auditory Comprehension: Impaired Yes/No Questions: Impaired Basic Immediate Environment Questions:  (4/5) Complex Questions:  (3/5) Commands: Impaired One Step Basic Commands:  (2/4) Two Step Basic Commands:  (1/3)    Expression Expression Primary Mode of Expression: Verbal Verbal Expression Overall Verbal Expression: Impaired Automatic Speech: Day of week;Month of year;Counting (Counting: 8/10; days: 2/7 with cues for initiaion; months: 0/12) Repetition: Impaired Level of Impairment:  (1/5) Naming: Impairment Responsive:  (0/5) Confrontation: Impaired  (2/10) Convergent:  (Sentence completion: 0/5) Divergent: Not tested Verbal Errors: Phonemic paraphasias;Semantic paraphasias;Neologisms;Perseveration Pragmatics: No impairment   Oral / Surveyor, quantity Overall Motor Speech: Impaired Respiration: Within functional limits Phonation: Low vocal intensity Resonance: Within functional limits Articulation: Impaired Level of Impairment: Sentence Intelligibility: Intelligibility reduced Word: 75-100% accurate Phrase: 75-100% accurate Sentence: 75-100% accurate           Ramere Downs I. Hardin Negus, [place], [place] Office number [phone]  Horton Marshall 06/09/2022, 9:30 AM

## 2022-06-09 NOTE — Progress Notes (Signed)
Occupational Therapy Treatment Patient Details Name: Rebekah Pope MRN: [id number] DOB: Mar 07, 1941 Today's Date: 06/09/2022   History of present illness 82 y.o. female presents to [facility] on 06/04/2022 with sudden onset unresponsiveness. CTA revealed occlusion of L ICA. Pt underwent IR thrombectomy on 12/28, remaining intubated post-procedure. PMHx: complex regional pain syndrome, essential HTN, HLD, hypothyroidism, a fib, TVAR, embolic strokes.   OT comments  Pt progressing well towards goals this session, needing mod -max A for ADLs, mod A+2 for bed mobility and x2 stand pivot transfers with RW. Pt able to identify numbers held up in different areas of visual fields, denies diplopia at time of OT session. Pt following commands with increased time. Pt presenting with impairments listed below, will follow acutely. Recommend AIR at d/c.   Recommendations for follow up therapy are one component of a multi-disciplinary discharge planning process, led by the attending physician.  Recommendations may be updated based on patient status, additional functional criteria and insurance authorization.    Follow Up Recommendations  Acute inpatient rehab (3hours/day)     Assistance Recommended at Discharge Frequent or constant Supervision/Assistance  Patient can return home with the following  Two people to help with walking and/or transfers;Two people to help with bathing/dressing/bathroom;Assistance with feeding;Assistance with cooking/housework;Direct supervision/assist for medications management;Direct supervision/assist for financial management;Assist for transportation;Help with stairs or ramp for entrance   Equipment Recommendations  Other (comment) (defer)    Recommendations for Other Services Rehab consult    Precautions / Restrictions Precautions Precautions: Fall;Other (comment) Precaution Comments: mild diplopia with impaired oculomotor function Restrictions Weight Bearing  Restrictions: No       Mobility Bed Mobility Overal bed mobility: Needs Assistance Bed Mobility: Supine to Sit     Supine to sit: Mod assist, +2 for physical assistance     General bed mobility comments: use of bed pad and helicopter method to get EOB    Transfers Overall transfer level: Needs assistance Equipment used: Rolling walker (2 wheels) Transfers: Sit to/from Stand, Bed to chair/wheelchair/BSC Sit to Stand: Mod assist, +2 physical assistance     Step pivot transfers: Mod assist, +2 physical assistance     General transfer comment: perfromed x2 to [facility] and to chair     Balance Overall balance assessment: Needs assistance Sitting-balance support: No upper extremity supported, Feet supported Sitting balance-Leahy Scale: Fair Sitting balance - Comments: sits unsupported without LOB   Standing balance support: Bilateral upper extremity supported, Reliant on assistive device for balance Standing balance-Leahy Scale: Poor Standing balance comment: reliant on external support                           ADL either performed or assessed with clinical judgement   ADL Overall ADL's : Needs assistance/impaired Eating/Feeding: Moderate assistance   Grooming: Moderate assistance   Upper Body Bathing: Moderate assistance   Lower Body Bathing: Maximal assistance   Upper Body Dressing : Moderate assistance   Lower Body Dressing: Maximal assistance   Toilet Transfer: Moderate assistance;+2 for physical assistance   Toileting- Clothing Manipulation and Hygiene: Maximal assistance Toileting - Clothing Manipulation Details (indicate cue type and reason): pericare in standing, incontinent     Functional mobility during ADLs: Moderate assistance;+2 for physical assistance;Rolling walker (2 wheels)      Extremity/Trunk Assessment Upper Extremity Assessment Upper Extremity Assessment: RUE deficits/detail RUE Deficits / Details: can bring hand halfway toward  mouth in supine, able to flex shoulder ~70* actively to reach  forward and touch therapist hand RUE Sensation: decreased light touch RUE Coordination: decreased fine motor;decreased gross motor   Lower Extremity Assessment Lower Extremity Assessment: Defer to PT evaluation        Vision   Vision Assessment?: Yes Eye Alignment: Impaired (comment) Ocular Range of Motion: Impaired-to be further tested in functional context Tracking/Visual Pursuits: Decreased smoothness of horizontal tracking;Decreased smoothness of vertical tracking Saccades: Additional eye shifts occurred during testing;Additional head turns occurred during testing Visual Fields: No apparent deficits Diplopia Assessment: Disappears with one eye closed;Only with right gaze;Present in near gaze;Objects split on top of one another Depth Perception: Undershoots Additional Comments: will futher assess, able to identify numbers held up in different visual field quadrant, denies diplopia at time of session   Perception Perception Perception: Not tested   Praxis Praxis Praxis: Not tested Praxis-Other Comments: able to scoot backward from edge of chair to back of chair, initiates movement with increased time    Cognition Arousal/Alertness: Awake/alert Behavior During Therapy: Flat affect Overall Cognitive Status: Difficult to assess (aphasic) Area of Impairment: Following commands, Problem solving                       Following Commands: Follows one step commands with increased time     Problem Solving: Slow processing, Decreased initiation, Requires verbal cues, Difficulty sequencing, Requires tactile cues          Exercises      Shoulder Instructions       General Comments VSS on RA, daughter present during session    Pertinent Vitals/ Pain       Pain Assessment Pain Assessment: No/denies pain  Home Living     Available Help at Discharge: Family;Available 24 hours/day Type of Home: House                               Lives With: Spouse    Prior Functioning/Environment              Frequency  Min 2X/week        Progress Toward Goals  OT Goals(current goals can now be found in the care plan section)  Progress towards OT goals: Progressing toward goals  Acute Rehab OT Goals Patient Stated Goal: none stated OT Goal Formulation: With patient Time For Goal Achievement: 06/23/22 Potential to Achieve Goals: Good ADL Goals Pt Will Perform Grooming: with set-up;with supervision;sitting Pt Will Perform Upper Body Bathing: with set-up;with supervision;sitting Pt Will Perform Lower Body Bathing: with mod assist;sit to/from stand Pt Will Perform Lower Body Dressing: with min guard assist;sit to/from stand Pt Will Transfer to Toilet: with mod assist;bedside commode Pt Will Perform Toileting - Clothing Manipulation and hygiene: Independently;sitting/lateral leans Additional ADL Goal #1: bed mobility with min A +2 in preparation for ADL tasks  Plan Discharge plan remains appropriate    Co-evaluation    PT/OT/SLP Co-Evaluation/Treatment: Yes Reason for Co-Treatment: Complexity of the patient's impairments (multi-system involvement);To address functional/ADL transfers;For patient/therapist safety   OT goals addressed during session: ADL's and self-care      AM-PAC OT "6 Clicks" Daily Activity     Outcome Measure   Help from another person eating meals?: A Lot Help from another person taking care of personal grooming?: A Lot Help from another person toileting, which includes using toliet, bedpan, or urinal?: Total Help from another person bathing (including washing, rinsing, drying)?: A Lot Help from another person to put  on and taking off regular upper body clothing?: A Lot Help from another person to put on and taking off regular lower body clothing?: Total 6 Click Score: 10    End of Session Equipment Utilized During Treatment: Gait belt;Rolling  walker (2 wheels)  OT Visit Diagnosis: Unsteadiness on feet (R26.81);Other abnormalities of gait and mobility (R26.89);Muscle weakness (generalized) (M62.81);Other symptoms and signs involving the nervous system (R29.898);Other symptoms and signs involving cognitive function;Hemiplegia and hemiparesis Hemiplegia - Right/Left: Right Hemiplegia - dominant/non-dominant: Non-Dominant Hemiplegia - caused by: Cerebral infarction   Activity Tolerance Patient tolerated treatment well   Patient Left in chair;with call bell/phone within reach;with chair alarm set;with nursing/sitter in room   Nurse Communication Mobility status        Time: [id number] OT Time Calculation (min): 29 min  Charges: OT General Charges $OT Visit: 1 Visit OT Treatments $Self Care/Home Management : 8-22 mins  Renaye Rakers, OTD, OTR/L SecureChat Preferred Acute Rehab [phone] - [place] 06/09/2022, 10:16 AM

## 2022-06-10 DIAGNOSIS — I639 Cerebral infarction, unspecified: Secondary | ICD-10-CM | POA: Diagnosis not present

## 2022-06-10 LAB — GLUCOSE, CAPILLARY
Glucose-Capillary: 110 mg/dL — ABNORMAL HIGH (ref 70–99)
Glucose-Capillary: 130 mg/dL — ABNORMAL HIGH (ref 70–99)
Glucose-Capillary: 151 mg/dL — ABNORMAL HIGH (ref 70–99)
Glucose-Capillary: 89 mg/dL (ref 70–99)
Glucose-Capillary: 94 mg/dL (ref 70–99)
Glucose-Capillary: 98 mg/dL (ref 70–99)

## 2022-06-10 NOTE — Progress Notes (Unsigned)
Cardiology Office Note:    Date:  06/11/2022   ID:  Rebekah Pope, DOB [birth year], MRN [id number]  PCP:  Rhea Bleacher, NP  Cardiologist:  Shirlee More, MD    Referring MD: Rhea Bleacher, NP    ASSESSMENT:    1. Sick sinus syndrome ([place])   2. Sinus bradycardia   3. Chronotropic incompetence   4. S/P TAVR (transcatheter aortic valve replacement)   5. Paroxysmal atrial fibrillation (HCC)   6. On amiodarone therapy   7. LBBB (left bundle branch block)   8. Hypertensive heart disease with chronic diastolic congestive heart failure ([place])   9. Cerebrovascular accident (CVA), unspecified mechanism ([place])    PLAN:    In order of problems listed above:  ***   Next appointment: ***   Medication Adjustments/Labs and Tests Ordered: Current medicines are reviewed at length with the patient today.  Concerns regarding medicines are outlined above.  No orders of the defined types were placed in this encounter.  No orders of the defined types were placed in this encounter.   No chief complaint on file.   History of Present Illness:    Rebekah Pope is a 82 y.o. female with a hx of recent stroke since her last visit, sick sinus syndrome with marked symptomatic sinus bradycardia and chronotropic incompetence left bundle branch block hypertensive heart disease with previous heart failure hyperlipidemia hypothyroidism paroxysmal atrial fibrillation maintained on low-dose amiodarone and chronic anticoagulation and TAVR 03/02/2022 last seen 04/28/2022 prior to her stroke..  In hospital she was seen by EP with plans for reassessment after discharge regarding pacemaker therapy.  She was redmitted 06/04/22 with stroke upon getting back to bed after using the bathroom found unresponsive, EMS was called Found with  Exam consistent with a LVO-likely MCA on the left.  Noncontrast head CT unremarkable for bleed. CT angiography head and neck With occlusion of the left intracranial ICA  at the supraclinoid segment extending into the M1 segment with further occlusion of the M2 branches in the sylvian fissure.  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 Burt Knack & Dr. Cyndia Bent   Severe aortic stenosis    Sinus bradycardia    Stroke (cerebrum) [facility])     Past Surgical History:  Procedure Laterality Date   CESAREAN SECTION     INTRAOPERATIVE TRANSTHORACIC ECHOCARDIOGRAM N/A 02/10/2022   Procedure: INTRAOPERATIVE TRANSTHORACIC ECHOCARDIOGRAM;  Surgeon: Sherren Mocha, MD;  Location: [place] [facility];  Service: Open Heart Surgery;  Laterality: N/A;   IR ANGIO VERTEBRAL SEL VERTEBRAL UNI L MOD  SED  06/04/2022   IR CT HEAD LTD  06/04/2022   IR PERCUTANEOUS ART THROMBECTOMY/INFUSION INTRACRANIAL INC DIAG ANGIO  06/04/2022   IR US GUIDE VASC ACCESS RIGHT  06/04/2022   RADIOLOGY WITH ANESTHESIA N/A 06/04/2022   Procedure: IR WITH ANESTHESIA;  Surgeon: Radiologist, Medication, MD;  Location: [place];  Service: Radiology;  Laterality: N/A;   RIGHT HEART CATH AND CORONARY ANGIOGRAPHY N/A 10/31/2021   Procedure: RIGHT HEART CATH AND CORONARY ANGIOGRAPHY;   Surgeon: Burnell Blanks, MD;  Location: [place] [facility];  Service: Cardiovascular;  Laterality: N/A;   TRANSCATHETER AORTIC VALVE REPLACEMENT, TRANSFEMORAL N/A 02/10/2022   Procedure: Transcatheter Aortic Valve Replacement, Transfemoral;  Surgeon: Sherren Mocha, MD;  Location: [place] [facility];  Service: Open Heart Surgery;  Laterality: N/A;    Current Medications: No outpatient medications have been marked as taking for the 06/11/22 encounter (Appointment) with Richardo Priest, MD.     Allergies:   Patient has no known allergies.   Social History   Socioeconomic History   Marital status: Married    Spouse name: Richard   Number of children: 4   Years of education: Not on file   Highest education level: Not on file  Occupational History   Occupation: Midwife  Tobacco Use   Smoking status: Never    Passive exposure: Past   Smokeless tobacco: Never  Vaping Use   Vaping Use: Never used  Substance and Sexual Activity   Alcohol use: Never   Drug use: Never   Sexual activity: Not on file  Other Topics Concern   Not on file  Social History Narrative   Not on file   Social Determinants of Health   Financial Resource Strain: Not on file  Food Insecurity: No Food Insecurity (06/08/2022)   Hunger Vital Sign    Worried About Running Out of Food in the Last Year: Never true    Ran Out of Food in the Last Year: Never true  Transportation Needs: No Transportation Needs (06/08/2022)   PRAPARE - Hydrologist (Medical): No    Lack of Transportation (Non-Medical): No  Physical Activity: Not on file  Stress: Not on file  Social Connections: Not on file     Family History: The patient's ***family history includes Dementia in her mother; Heart disease in her father; Hip fracture in her mother; Pneumonia in her mother. ROS:   Please see the history of present illness.    All other systems reviewed and are  negative.  EKGs/Labs/Other Studies Reviewed:    The following studies were reviewed today:  EKG:  EKG ordered today and personally reviewed.  The ekg ordered today demonstrates ***  Recent Labs: 05/10/2022: B Natriuretic Peptide 271.6 06/04/2022: ALT 19; TSH 0.926 06/05/2022: Magnesium 2.0 06/08/2022: BUN 16; Creatinine, Ser 0.95; Hemoglobin 11.5; Platelets 217; Potassium 3.9; Sodium 137  Recent Lipid Panel    Component Value Date/Time   CHOL 131 06/05/2022 0316   TRIG 74 06/05/2022 0316   HDL 65 06/05/2022 0316   CHOLHDL 2.0 06/05/2022 0316   VLDL 15 06/05/2022 0316   LDLCALC 51 06/05/2022 0316    Physical Exam:    VS:  There were no vitals taken for this visit.    Wt Readings from Last 3 Encounters:  06/08/22 193 lb 9 oz (87.8 kg)  06/03/22 190 lb (86.2 kg)  05/18/22 186 lb 11.2 oz (84.7 kg)     GEN: *** Well nourished, well developed  in no acute distress HEENT: Normal NECK: No JVD; No carotid bruits LYMPHATICS: No lymphadenopathy CARDIAC: ***RRR, no murmurs, rubs, gallops RESPIRATORY:  Clear to auscultation without rales, wheezing or rhonchi  ABDOMEN: Soft, non-tender, non-distended MUSCULOSKELETAL:  No edema; No deformity  SKIN: Warm and dry NEUROLOGIC:  Alert and oriented x 3 PSYCHIATRIC:  Normal affect    Signed, Norman Herrlich, MD  06/11/2022 7:50 AM    [facility]

## 2022-06-10 NOTE — Progress Notes (Signed)
Occupational Therapy Treatment Patient Details Name: Rebekah Pope MRN: [id number] DOB: Feb 06, 1941 Today's Date: 06/10/2022   History of present illness 82 y.o. female presents to [facility] hospital on 06/04/2022 with sudden onset unresponsiveness. CTA revealed occlusion of L ICA. Pt underwent IR thrombectomy on 12/28, remaining intubated post-procedure. PMHx: complex regional pain syndrome, essential HTN, HLD, hypothyroidism, a fib, TVAR, embolic strokes.   OT comments  Pt progressing towards goals this session, needing mod A for LB dressing at bed level, set up A for grooming task in chair. Pt min A for bed mobility, mod A for step pivot transfer to chair with use of RW. Pt still presenting with impaired coordination and weakness in RUE > LUE, however able to use to assist for functional grooming task (pt L hand dominant). Pt presenting with impairments listed below, will follow acutely. Continue to recommend AIR at d/c.   Recommendations for follow up therapy are one component of a multi-disciplinary discharge planning process, led by the attending physician.  Recommendations may be updated based on patient status, additional functional criteria and insurance authorization.    Follow Up Recommendations  Acute inpatient rehab (3hours/day)     Assistance Recommended at Discharge Frequent or constant Supervision/Assistance  Patient can return home with the following  Two people to help with walking and/or transfers;Two people to help with bathing/dressing/bathroom;Assistance with feeding;Assistance with cooking/housework;Direct supervision/assist for medications management;Direct supervision/assist for financial management;Assist for transportation;Help with stairs or ramp for entrance   Equipment Recommendations  Other (comment) (defer)    Recommendations for Other Services Rehab consult    Precautions / Restrictions Precautions Precautions: Fall;Other (comment) Precaution Comments: mild  diplopia with impaired oculomotor function Restrictions Weight Bearing Restrictions: No       Mobility Bed Mobility Overal bed mobility: Needs Assistance Bed Mobility: Supine to Sit     Supine to sit: Min assist     General bed mobility comments: mod cues to scoot to EOB    Transfers Overall transfer level: Needs assistance Equipment used: Rolling walker (2 wheels) Transfers: Sit to/from Stand, Bed to chair/wheelchair/BSC Sit to Stand: Mod assist     Step pivot transfers: Mod assist           Balance Overall balance assessment: Needs assistance Sitting-balance support: No upper extremity supported, Feet supported Sitting balance-Leahy Scale: Fair Sitting balance - Comments: sits unsupported without LOB   Standing balance support: Bilateral upper extremity supported, Reliant on assistive device for balance Standing balance-Leahy Scale: Poor Standing balance comment: reliant on external support                           ADL either performed or assessed with clinical judgement   ADL Overall ADL's : Needs assistance/impaired     Grooming: Set up;Sitting;Wash/dry face;Brushing hair Grooming Details (indicate cue type and reason): combing hair upon arrival with LUE, smoothing with RUE, washing face with LUE             Lower Body Dressing: Moderate assistance Lower Body Dressing Details (indicate cue type and reason): donning L sock at bed level using figure 4 Toilet Transfer: Moderate assistance;Stand-pivot;Rolling walker (2 wheels);BSC/3in1 Toilet Transfer Details (indicate cue type and reason): simulated via functional mobility to chair         Functional mobility during ADLs: Moderate assistance;Rolling walker (2 wheels);Cueing for safety;Cueing for sequencing      Extremity/Trunk Assessment Upper Extremity Assessment Upper Extremity Assessment: Generalized weakness;RUE deficits/detail RUE Deficits /  Details: can reach ~110* overhead with  RUE, can bring hand to mouth, can open/close hand. Decreased coordination compared to LUE. Crepitus noted in R shoulder with WB on RW RUE Sensation: decreased light touch RUE Coordination: decreased fine motor;decreased gross motor   Lower Extremity Assessment Lower Extremity Assessment: Defer to PT evaluation        Vision   Vision Assessment?: Yes Eye Alignment: Impaired (comment) Ocular Range of Motion: Impaired-to be further tested in functional context Tracking/Visual Pursuits: Decreased smoothness of horizontal tracking;Decreased smoothness of vertical tracking Visual Fields: No apparent deficits Diplopia Assessment: Disappears with one eye closed;Only with right gaze;Present in near gaze;Objects split on top of one another Depth Perception: Undershoots Additional Comments: will further assess, able to read newspaper and write words, performing task upon arrival   Perception Perception Perception: Not tested   Praxis Praxis Praxis: Not tested    Cognition Arousal/Alertness: Awake/alert Behavior During Therapy: Flat affect Overall Cognitive Status: Difficult to assess (aphasic) Area of Impairment: Following commands, Problem solving                       Following Commands: Follows one step commands with increased time     Problem Solving: Slow processing, Decreased initiation, Requires verbal cues, Difficulty sequencing, Requires tactile cues General Comments: follows commands with increased time, difficult to assess due to aphasia        Exercises      Shoulder Instructions       General Comments VSS on RA, spouse present during session    Pertinent Vitals/ Pain       Pain Assessment Pain Assessment: No/denies pain  Home Living                                          Prior Functioning/Environment              Frequency  Min 2X/week        Progress Toward Goals  OT Goals(current goals can now be found in the  care plan section)  Progress towards OT goals: Progressing toward goals  Acute Rehab OT Goals Patient Stated Goal: none stated OT Goal Formulation: With patient Time For Goal Achievement: 06/23/22 Potential to Achieve Goals: Good ADL Goals Pt Will Perform Grooming: with set-up;with supervision;sitting Pt Will Perform Upper Body Bathing: with set-up;with supervision;sitting Pt Will Perform Lower Body Bathing: with mod assist;sit to/from stand Pt Will Perform Lower Body Dressing: with min guard assist;sit to/from stand Pt Will Transfer to Toilet: with mod assist;bedside commode Pt Will Perform Toileting - Clothing Manipulation and hygiene: Independently;sitting/lateral leans Additional ADL Goal #1: bed mobility with min A +2 in preparation for ADL tasks  Plan Discharge plan remains appropriate    Co-evaluation                 AM-PAC OT "6 Clicks" Daily Activity     Outcome Measure   Help from another person eating meals?: A Little Help from another person taking care of personal grooming?: A Little Help from another person toileting, which includes using toliet, bedpan, or urinal?: Total Help from another person bathing (including washing, rinsing, drying)?: A Lot Help from another person to put on and taking off regular upper body clothing?: A Lot Help from another person to put on and taking off regular lower body clothing?: Total 6 Click Score: 12  End of Session Equipment Utilized During Treatment: Gait belt;Rolling walker (2 wheels)  OT Visit Diagnosis: Unsteadiness on feet (R26.81);Other abnormalities of gait and mobility (R26.89);Muscle weakness (generalized) (M62.81);Other symptoms and signs involving the nervous system (R29.898);Other symptoms and signs involving cognitive function;Hemiplegia and hemiparesis Hemiplegia - Right/Left: Right Hemiplegia - dominant/non-dominant: Non-Dominant Hemiplegia - caused by: Cerebral infarction   Activity Tolerance Patient  tolerated treatment well   Patient Left in chair;with call bell/phone within reach;with chair alarm set;with family/visitor present   Nurse Communication Mobility status        Time: 7824-2353 OT Time Calculation (min): 20 min  Charges: OT General Charges $OT Visit: 1 Visit OT Treatments $Self Care/Home Management : 8-22 mins  Renaye Rakers, OTD, OTR/L SecureChat Preferred Acute Rehab [phone] - [place] 06/10/2022, 4:09 PM

## 2022-06-10 NOTE — Progress Notes (Signed)
Inpatient Rehabilitation Admissions Coordinator   I met at bedside with patient, spouse and daughter, [name]. We reviewed estimated cost of care if [facility] approves CIR. Auth began 1/2 and we await their approval for possible CIR admit. I also discussed with them the need for increased caregiver su[pports beyond just her spouse of 82 years old. I requested family discuss how caregivers supports can be arranged after a CIR admit.  Danne Baxter, RN, MSN Rehab Admissions Coordinator [phone] 06/10/2022 11:36 AM

## 2022-06-10 NOTE — Progress Notes (Addendum)
STROKE TEAM PROGRESS NOTE   INTERVAL HISTORY Patient is seen in Rebekah room with Rebekah Pope and Pope at the bedside.  Rebekah vital signs have been stable, and Rebekah neurological exam is stable.  She remains aphasic but is able to name some objects and state Rebekah Pope's name. Vital signs stable.  Patient is awaiting insurance approval for inpatient rehab Vitals:   06/10/22 0437 06/10/22 0538 06/10/22 0708 06/10/22 1110  BP: (!) 166/52 (!) 133/48 (!) 153/52 (!) 123/48  Pulse:   (!) 49 (!) 49  Resp: 16 18 16 16   Temp: 98.4 F (36.9 C) 98 F (36.7 C) 98.3 F (36.8 C) 97.7 F (36.5 C)  TempSrc: Oral Oral Oral Oral  SpO2: 95% 94% 96% 97%  Weight:      Height:       CBC:  Recent Labs  Lab 06/04/22 0835 06/04/22 0837 06/07/22 0253 06/08/22 0833  WBC 8.1   < > 6.9 7.0  NEUTROABS 4.0  --   --  4.8  HGB 11.6*   < > 10.6* 11.5*  HCT 34.1*   < > 31.5* 34.5*  MCV 88.6   < > 88.0 88.9  PLT 166   < > 182 217   < > = values in this interval not displayed.    Basic Metabolic Panel:  Recent Labs  Lab 06/05/22 0318 06/06/22 0320 06/07/22 0253 06/08/22 0833  NA 141   < > 138 137  K 3.6   < > 3.7 3.9  CL 110   < > 106 104  CO2 22   < > 23 25  GLUCOSE 98   < > 96 106*  BUN 21   < > 15 16  CREATININE 0.98   < > 0.90 0.95  CALCIUM 8.5*   < > 8.5* 8.6*  MG 2.0  --   --   --    < > = values in this interval not displayed.    Lipid Panel:  Recent Labs  Lab 06/05/22 0316  CHOL 131  TRIG 74  HDL 65  CHOLHDL 2.0  VLDL 15  LDLCALC 51    HgbA1c: No results for input(s): "HGBA1C" in the last 168 hours. Urine Drug Screen:  Recent Labs  Lab 06/04/22 1029  LABOPIA NONE DETECTED  COCAINSCRNUR NONE DETECTED  LABBENZ NONE DETECTED  AMPHETMU NONE DETECTED  THCU NONE DETECTED  LABBARB NONE DETECTED     Alcohol Level  Recent Labs  Lab 06/04/22 0835  ETH <10    IMAGING past 24 hours No results found.  PHYSICAL EXAM  Temp:  [97.7 F (36.5 C)-99.5 F (37.5 C)] 97.7 F  (36.5 C) (01/03 1110) Pulse Rate:  [47-49] 49 (01/03 1110) Resp:  [14-18] 16 (01/03 1110) BP: (123-181)/(48-58) 123/48 (01/03 1110) SpO2:  [94 %-98 %] 97 % (01/03 1110)  General she is well-developed well-nourished.  No acute distress.  Mental Status -  Awake and alert  Nonfluent speech with significant word hesitancy. Aphasia.  Able to name 1 out of 3 objects, able to state Pope's name Able to follow commands. pupils equal round reactive to light. Sensory -intact in all 4 extremities. Motor-nonfocal can move all 4 extremities off bed. Coordination -no ataxia noted. Gait deferred.  ASSESSMENT/PLAN Rebekah Pope is a 82 y.o. 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 Last known well is about 7 AM when she woke up and went to the bathroom and upon coming back laid in bed and was noted to be unresponsive by the Pope. To IR for emergent mechanical thrombectomy of left ICA terminus occlusion.   Stroke: bilateral L>R acute scattered punctate infarcts due to left ICA and MCA occlusion s/p IR with TICI 3 revascularization, etiology likely Afib failed eliquis Code Stroke CT head  No definite evidence of evolved left MCA infarct on motion degraded initial noncontrast head CT. ASPECTS 10.  CTA head & neck Occlusion of the left intracranial ICA at the supraclinoid segment extending to the M1 segment with further occlusion of M2 branches in the sylvian fissure. There is diminutive reconstitution of flow in more distal MCA branches. Occluded bilateral PCAs shortly after their origins with diminutive reconstitution of flow in distal vessels, favored chronic. Focal high-grade stenosis of the left A3 segment. MRI /MRA - A few scattered punctate foci of new  acute/early subacute ischemia within multiple vascular territories. Multiple other recent foci of subacute ischemia as seen on 05/10/2022. The supraclinoid left ICA and the left MCA are now patent. 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  MRI showed multifocal scattered embolic infarcts.  EF 65 to 70%.  LDL 176, A1c 6.0.  Xarelto switched to Eliquis and put on Lipitor 80 on discharge.  Hypertension Home meds:  norvasc 10mg , hydralazine 25 mg  Continue current SBP goal < 160 Long-term BP goal normotensive  Hyperlipidemia Home meds:  atorvastatin 80mg , resumed in hospital LDL 51, goal < 70 Continue statin at discharge  Hypothyroidism  - Restart home synthroid   Other Stroke Risk Factors Advanced Age >/= 44  Obesity, Body mass index is 34.29 kg/m., BMI >/= 30 associated with increased stroke risk, recommend weight loss, diet and exercise as appropriate   Other Active Problems CKD, stage 3 Anemia -HGB 11.1-->10.4--> 10.6--> 11.5. Resolved.   Hospital day # [place] , MSN, AGACNP-BC [facility] See Amion for schedule and pager information 06/10/2022 12:30 PM   I have personally obtained history,examined this patient, reviewed notes, independently viewed imaging studies, participated in medical decision making and plan of care.ROS completed by me personally and pertinent positives fully documented  I have  made any additions or clarifications directly to the above note. Agree with note above.  Patient medically stable for transfer to inpatient rehab when bed available.  Antony Contras, MD Medical Director [facility] Pager: [phone] 06/10/2022 2:57 PM  To contact Stroke Continuity provider, please refer to [email or web address] After hours, contact General Neurology

## 2022-06-10 NOTE — H&P (Signed)
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  Per chart review lives with spouse.  1 level home one-step to entry.  Ambulatory with rolling walker.  Presented 06/04/2022 with altered mental status, leftward gaze and right-sided weakness/aphasia.  Cranial CT scan showed no definite evidence of involved left MCA infarction on motion degraded initial contrast head CT.  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  Occluded bilateral PCA shortly after their origins with diminutive reconstitution of flow in distal vessels favored to be chronic.  Focal high-grade stenosis of the left A3 segment.  Patient underwent revascularization per interventional radiology.  Echocardiogram with ejection fraction of 60 to 65% no wall motion abnormalities.  Admission chemistries unremarkable except glucose 108, urine drug screen negative, urinalysis negative nitrite.  Neurology follow-up patient remains on Eliquis as prior to admission.  Zio patch with no A-fib detected.  Tolerating a regular diet.  Therapy evaluations completed due to patient decreased functional ability right-sided weakness and aphasia was admitted for a comprehensive rehab program.  Review of Systems  Constitutional:  Negative  for chills and fever.  HENT:  Negative for hearing loss.   Eyes:  Negative for blurred vision and double vision.  Respiratory:  Negative for cough, shortness of breath and wheezing.   Cardiovascular:  Positive for palpitations and leg swelling. Negative for chest pain.  Gastrointestinal:  Positive for constipation. Negative for heartburn, nausea and vomiting.  Genitourinary:  Negative for dysuria, flank pain and hematuria.  Musculoskeletal:  Positive for joint pain and myalgias.  Skin:  Negative for rash.  Neurological:  Positive for dizziness, speech change and weakness.  All other systems reviewed and are negative.  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 Excell Seltzer & Dr. Laneta Simmers   Severe aortic stenosis    Sinus bradycardia    Stroke (cerebrum) [facility])    Past Surgical History:  Procedure Laterality Date   CESAREAN SECTION     INTRAOPERATIVE TRANSTHORACIC ECHOCARDIOGRAM N/A 02/10/2022   Procedure: INTRAOPERATIVE TRANSTHORACIC ECHOCARDIOGRAM;  Surgeon: Tonny Bollman, MD;  [facility] INVASIVE CV LAB;  Service: Open Heart Surgery;  Laterality: N/A;   IR ANGIO VERTEBRAL SEL VERTEBRAL UNI  L MOD SED  06/04/2022   IR CT HEAD LTD  06/04/2022   IR PERCUTANEOUS ART THROMBECTOMY/INFUSION INTRACRANIAL INC DIAG ANGIO  06/04/2022   IR US GUIDE VASC ACCESS RIGHT  06/04/2022   RADIOLOGY WITH  ANESTHESIA N/A 06/04/2022   Procedure: IR WITH ANESTHESIA;  Surgeon: Radiologist, Medication, MD;  Location: [facility] OR;  Service: Radiology;  Laterality: N/A;   RIGHT HEART CATH AND CORONARY ANGIOGRAPHY N/A 10/31/2021   Procedure: RIGHT HEART CATH AND CORONARY ANGIOGRAPHY;  Surgeon: Kathleene Hazel, MD;  Location: [facility] INVASIVE CV LAB;  Service: Cardiovascular;  Laterality: N/A;   TRANSCATHETER AORTIC VALVE REPLACEMENT, TRANSFEMORAL N/A 02/10/2022   Procedure: Transcatheter Aortic Valve Replacement, Transfemoral;  Surgeon: Tonny Bollman, MD;  Location: [facility] INVASIVE CV LAB;  Service: Open Heart Surgery;  Laterality: N/A;   Family History  Problem Relation Age of Onset   Dementia Mother    Pneumonia Mother    Hip fracture Mother    Heart disease Father    Social History:  reports that she has never smoked. She has been exposed to tobacco smoke. She has never used smokeless tobacco. She reports that she does not drink alcohol and does not use drugs. Allergies: No Known Allergies Medications Prior to Admission  Medication Sig Dispense Refill   acetaminophen (TYLENOL) 325 MG tablet Take 2 tablets (650 mg total) by mouth every 6 (six) hours as needed for mild pain, fever or headache.     amiodarone (PACERONE) 200 MG tablet Take 0.5 tablets (100 mg total) by mouth daily. 30 tablet 0   amLODipine (NORVASC) 10 MG tablet Take 1 tablet (10 mg total) by mouth at bedtime. 30 tablet 0   apixaban (ELIQUIS) 5 MG TABS tablet Take 1 tablet (5 mg total) by mouth 2 (two) times daily. 60 tablet 0   atorvastatin (LIPITOR) 80 MG tablet Take 1 tablet (80 mg total) by mouth daily. 30 tablet 0   ENTRESTO 24-26 MG Take 1 tablet by mouth 2 (two) times daily. 60 tablet 0   hydrALAZINE (APRESOLINE) 25 MG tablet Take 1 tablet (25 mg total) by mouth 3 (three) times daily. 90 tablet 0   levothyroxine (SYNTHROID) 112 MCG tablet Take 1 tablet (112 mcg total) by mouth daily before breakfast. 30 tablet 0   oxyCODONE (OXY  IR/ROXICODONE) 5 MG immediate release tablet Take 1 tablet (5 mg total) by mouth every 4 (four) hours as needed for moderate pain or severe pain. 30 tablet 0      Home: Home Living Family/patient expects to be discharged to:: Skilled nursing facility Living Arrangements: Spouse/significant other Available Help at Discharge: Family, Available 24 hours/day Type of Home: House Home Access: Stairs to enter [employer] of Steps: 3 Entrance Stairs-Rails: None Home Layout: One level, Laundry or work area in basement [employer] Shower/Tub: Engineer, manufacturing systems: Handicapped height Bathroom Accessibility: Yes Home Equipment: Agricultural consultant (2 wheels), [employer] - single point Additional Comments: live wtih husband, son is 2 miles away  Lives With: Spouse   Functional History: Prior Function Prior Level of Function : Independent/Modified Independent Mobility Comments: ambulatory with RW, continued to have intermittent dizzy spells ADLs Comments: performing ADLs independently although not getting into bathtub; family states she was having HH?  Functional Status:  Mobility: Bed Mobility Overal bed mobility: Needs Assistance Bed Mobility: Supine to Sit Supine to sit: Min assist Sit to supine: Mod assist, +2 for physical assistance General bed mobility comments: mod cues to scoot to EOB Transfers Overall transfer level: Needs  assistance Equipment used: Rolling walker (2 wheels) Transfers: Sit to/from Stand, Bed to chair/wheelchair/BSC Sit to Stand: Mod assist Bed to/from chair/wheelchair/BSC transfer type:: Step pivot Step pivot transfers: Mod assist General transfer comment: perfromed x2 to [facility] and to chair      ADL: ADL Overall ADL's : Needs assistance/impaired Eating/Feeding: Moderate assistance Grooming: Set up, Sitting, Wash/dry face, Brushing hair Grooming Details (indicate cue type and reason): combing hair upon arrival with LUE, smoothing with RUE, washing  face with LUE Upper Body Bathing: Moderate assistance Lower Body Bathing: Maximal assistance Upper Body Dressing : Moderate assistance Lower Body Dressing: Moderate assistance Lower Body Dressing Details (indicate cue type and reason): donning L sock at bed level using figure 4 Toilet Transfer: Moderate assistance, Stand-pivot, Rolling walker (2 wheels), BSC/3in1 Toilet Transfer Details (indicate cue type and reason): simulated via functional mobility to chair Toileting- Clothing Manipulation and Hygiene: Maximal assistance Toileting - Clothing Manipulation Details (indicate cue type and reason): pericare in standing, incontinent Functional mobility during ADLs: Moderate assistance, Rolling walker (2 wheels), Cueing for safety, Cueing for sequencing  Cognition: Cognition Overall Cognitive Status: Difficult to assess (aphasic) Orientation Level: Disoriented to time Cognition Arousal/Alertness: Awake/alert Behavior During Therapy: Flat affect Overall Cognitive Status: Difficult to assess (aphasic) Area of Impairment: Following commands, Problem solving Following Commands: Follows one step commands with increased time Problem Solving: Slow processing, Decreased initiation, Requires verbal cues, Difficulty sequencing, Requires tactile cues General Comments: follows commands with increased time, difficult to assess due to aphasia Difficult to assess due to: Impaired communication  Physical Exam: Blood pressure (!) 145/63, pulse (!) 55, temperature 98.6 F (37 C), resp. rate 18, height 5\' 3"  (1.6 m), weight 87.8 kg, SpO2 98 %. Physical Exam Constitutional:      General: She is not in acute distress.    Appearance: She is obese.  HENT:     Head:     Comments: Bruising over right cheek.    Right Ear: External ear normal.     Left Ear: External ear normal.     Nose: Nose normal.     Mouth/Throat:     Mouth: Mucous membranes are moist.  Eyes:     Extraocular Movements: Extraocular  movements intact.     Pupils: Pupils are equal, round, and reactive to light.  Cardiovascular:     Rate and Rhythm: Regular rhythm. Bradycardia present.     Heart sounds: No murmur heard.    No gallop.  Pulmonary:     Effort: Pulmonary effort is normal. No respiratory distress.     Breath sounds: No wheezing.  Abdominal:     General: Bowel sounds are normal. There is no distension.     Palpations: Abdomen is soft.     Tenderness: There is no abdominal tenderness.  Musculoskeletal:        General: No swelling or tenderness.     Cervical back: Normal range of motion.  Skin:    Comments: Bruising right cheek. Chronic lesion left cheek  Neurological:     Mental Status: She is alert.     Comments: Pt is alert. Follows basic commands. Expressive>>receptive language deficits. Generally garbled speech but better with automatic responses. Speech fairly clear. RUE 4/5. LUE 4+/5. RLE 4- to 4/5. LLLE 4+/5. No focal sensory changes. DTR's 1+.    Patient is alert.  She can provide her name but not place or time.  Significant word hesitancy/aphasia.  Psychiatric:        Mood and Affect: Mood  normal.        Behavior: Behavior normal.     Results for orders placed or performed during the hospital encounter of 06/04/22 (from the past 48 hour(s))  Glucose, capillary     Status: Abnormal   Collection Time: 06/09/22  8:33 AM  Result Value Ref Range   Glucose-Capillary 129 (H) 70 - 99 mg/dL    Comment: Glucose reference range applies only to samples taken after fasting for at least 8 hours.  Glucose, capillary     Status: Abnormal   Collection Time: 06/09/22 12:10 PM  Result Value Ref Range   Glucose-Capillary 106 (H) 70 - 99 mg/dL    Comment: Glucose reference range applies only to samples taken after fasting for at least 8 hours.  Glucose, capillary     Status: Abnormal   Collection Time: 06/09/22  4:15 PM  Result Value Ref Range   Glucose-Capillary 109 (H) 70 - 99 mg/dL    Comment: Glucose  reference range applies only to samples taken after fasting for at least 8 hours.  Glucose, capillary     Status: Abnormal   Collection Time: 06/10/22 12:21 AM  Result Value Ref Range   Glucose-Capillary 130 (H) 70 - 99 mg/dL    Comment: Glucose reference range applies only to samples taken after fasting for at least 8 hours.   Comment 1 Notify RN    Comment 2 Document in Chart   Glucose, capillary     Status: None   Collection Time: 06/10/22  4:09 AM  Result Value Ref Range   Glucose-Capillary 89 70 - 99 mg/dL    Comment: Glucose reference range applies only to samples taken after fasting for at least 8 hours.  Glucose, capillary     Status: None   Collection Time: 06/10/22  8:29 AM  Result Value Ref Range   Glucose-Capillary 94 70 - 99 mg/dL    Comment: Glucose reference range applies only to samples taken after fasting for at least 8 hours.   Comment 1 Notify RN    Comment 2 Document in Chart   Glucose, capillary     Status: Abnormal   Collection Time: 06/10/22 12:08 PM  Result Value Ref Range   Glucose-Capillary 151 (H) 70 - 99 mg/dL    Comment: Glucose reference range applies only to samples taken after fasting for at least 8 hours.   Comment 1 Notify RN    Comment 2 Document in Chart   Glucose, capillary     Status: Abnormal   Collection Time: 06/10/22  8:25 PM  Result Value Ref Range   Glucose-Capillary 110 (H) 70 - 99 mg/dL    Comment: Glucose reference range applies only to samples taken after fasting for at least 8 hours.  Glucose, capillary     Status: None   Collection Time: 06/10/22 11:31 PM  Result Value Ref Range   Glucose-Capillary 98 70 - 99 mg/dL    Comment: Glucose reference range applies only to samples taken after fasting for at least 8 hours.  Glucose, capillary     Status: None   Collection Time: 06/11/22  3:38 AM  Result Value Ref Range   Glucose-Capillary 99 70 - 99 mg/dL    Comment: Glucose reference range applies only to samples taken after fasting  for at least 8 hours.   No results found.    Blood pressure (!) 145/63, pulse (!) 55, temperature 98.6 F (37 C), resp. rate 18, height 5\' 3"  (1.6 m), weight  87.8 kg, SpO2 98 %.  Medical Problem List and Plan: 1. Functional deficits secondary to bilateral left greater than right acute scattered punctate infarct due to left ICA and MCA occlusion status post revascularization as well as history of ischemic cerebrovascular accident December 2023  -patient may  shower  -ELOS/Goals: 14-16 days, min assist to sup goals with PT, OT, min assist with SLP 2.  Antithrombotics: -DVT/anticoagulation: Eliquis  -antiplatelet therapy: N/A 3. Pain Management: Tylenol as needed 4. Mood/Behavior/Sleep: Provide emotional support  -antipsychotic agents: N/A 5. Neuropsych/cognition: This patient is not capable of making decisions on her own behalf. 6. Skin/Wound Care: Routine skin checks 7. Fluids/Electrolytes/Nutrition: Routine in and outs with follow-up chemistries 8.  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  Follow-up outpatient cardiology.  Continue amiodarone 100 mg daily 9.  CKD.  Baseline creatinine 1.20-1.30.  Follow-up chemistries on admit. 10.  Diastolic congestive heart failure.  Monitor for any signs of fluid overload.  Continue Entresto 24-26 mg twice daily  -daily weights 11.  Hypothyroidism.  Synthroid 12.  Hyperlipidemia.  Lipitor 13.  Constipation.  MiraLAX daily, Colace 100 mg twice daily   Charlton Amor, PA-C 06/11/2022

## 2022-06-10 NOTE — Progress Notes (Signed)
Inpatient Rehabilitation Admissions Coordinator   I have received insurance approval for admit to CIR and can arrange admit Thursday. I contacted patient's daughter, Anderson Malta and she is aware and in agreement. I will alert acute team and TOC of planned admit for Thursday.  Danne Baxter, RN, MSN Rehab Admissions Coordinator [phone] 06/10/2022 5:27 PM

## 2022-06-11 ENCOUNTER — Other Ambulatory Visit: Payer: Self-pay

## 2022-06-11 ENCOUNTER — Encounter (HOSPITAL_COMMUNITY): Payer: Self-pay | Admitting: Physical Medicine & Rehabilitation

## 2022-06-11 ENCOUNTER — Ambulatory Visit: Payer: Medicare PPO | Admitting: Cardiology

## 2022-06-11 ENCOUNTER — Inpatient Hospital Stay (HOSPITAL_COMMUNITY)
Admission: RE | Admit: 2022-06-11 | Discharge: 2022-06-25 | DRG: 056 | Disposition: A | Discharge status: Other Acute Inpatient Hospital | Payer: Medicare PPO | Source: Intra-hospital | Attending: Physical Medicine & Rehabilitation | Admitting: Physical Medicine & Rehabilitation

## 2022-06-11 DIAGNOSIS — R569 Unspecified convulsions: Secondary | ICD-10-CM | POA: Diagnosis not present

## 2022-06-11 DIAGNOSIS — R4182 Altered mental status, unspecified: Secondary | ICD-10-CM | POA: Diagnosis not present

## 2022-06-11 DIAGNOSIS — I6381 Other cerebral infarction due to occlusion or stenosis of small artery: Secondary | ICD-10-CM | POA: Diagnosis not present

## 2022-06-11 DIAGNOSIS — I5032 Chronic diastolic (congestive) heart failure: Secondary | ICD-10-CM | POA: Diagnosis not present

## 2022-06-11 DIAGNOSIS — I69320 Aphasia following cerebral infarction: Secondary | ICD-10-CM

## 2022-06-11 DIAGNOSIS — M1711 Unilateral primary osteoarthritis, right knee: Secondary | ICD-10-CM | POA: Diagnosis not present

## 2022-06-11 DIAGNOSIS — K5900 Constipation, unspecified: Secondary | ICD-10-CM | POA: Diagnosis present

## 2022-06-11 DIAGNOSIS — N189 Chronic kidney disease, unspecified: Secondary | ICD-10-CM | POA: Diagnosis not present

## 2022-06-11 DIAGNOSIS — J9601 Acute respiratory failure with hypoxia: Secondary | ICD-10-CM | POA: Diagnosis not present

## 2022-06-11 DIAGNOSIS — R001 Bradycardia, unspecified: Secondary | ICD-10-CM | POA: Diagnosis present

## 2022-06-11 DIAGNOSIS — Z8249 Family history of ischemic heart disease and other diseases of the circulatory system: Secondary | ICD-10-CM

## 2022-06-11 DIAGNOSIS — E785 Hyperlipidemia, unspecified: Secondary | ICD-10-CM | POA: Diagnosis present

## 2022-06-11 DIAGNOSIS — I6349 Cerebral infarction due to embolism of other cerebral artery: Secondary | ICD-10-CM | POA: Diagnosis not present

## 2022-06-11 DIAGNOSIS — E039 Hypothyroidism, unspecified: Secondary | ICD-10-CM | POA: Diagnosis present

## 2022-06-11 DIAGNOSIS — R638 Other symptoms and signs concerning food and fluid intake: Secondary | ICD-10-CM | POA: Diagnosis not present

## 2022-06-11 DIAGNOSIS — R401 Stupor: Secondary | ICD-10-CM | POA: Diagnosis not present

## 2022-06-11 DIAGNOSIS — M25561 Pain in right knee: Secondary | ICD-10-CM | POA: Diagnosis not present

## 2022-06-11 DIAGNOSIS — I63512 Cerebral infarction due to unspecified occlusion or stenosis of left middle cerebral artery: Secondary | ICD-10-CM | POA: Diagnosis not present

## 2022-06-11 DIAGNOSIS — N1832 Chronic kidney disease, stage 3b: Secondary | ICD-10-CM | POA: Diagnosis present

## 2022-06-11 DIAGNOSIS — Z7901 Long term (current) use of anticoagulants: Secondary | ICD-10-CM | POA: Diagnosis not present

## 2022-06-11 DIAGNOSIS — B974 Respiratory syncytial virus as the cause of diseases classified elsewhere: Secondary | ICD-10-CM | POA: Diagnosis not present

## 2022-06-11 DIAGNOSIS — I2510 Atherosclerotic heart disease of native coronary artery without angina pectoris: Secondary | ICD-10-CM | POA: Diagnosis present

## 2022-06-11 DIAGNOSIS — D649 Anemia, unspecified: Secondary | ICD-10-CM | POA: Diagnosis present

## 2022-06-11 DIAGNOSIS — Z953 Presence of xenogenic heart valve: Secondary | ICD-10-CM

## 2022-06-11 DIAGNOSIS — I63441 Cerebral infarction due to embolism of right cerebellar artery: Secondary | ICD-10-CM | POA: Diagnosis not present

## 2022-06-11 DIAGNOSIS — Z818 Family history of other mental and behavioral disorders: Secondary | ICD-10-CM

## 2022-06-11 DIAGNOSIS — I639 Cerebral infarction, unspecified: Secondary | ICD-10-CM | POA: Diagnosis not present

## 2022-06-11 DIAGNOSIS — Z79899 Other long term (current) drug therapy: Secondary | ICD-10-CM | POA: Diagnosis not present

## 2022-06-11 DIAGNOSIS — Z79890 Hormone replacement therapy (postmenopausal): Secondary | ICD-10-CM | POA: Diagnosis not present

## 2022-06-11 DIAGNOSIS — Z8679 Personal history of other diseases of the circulatory system: Secondary | ICD-10-CM

## 2022-06-11 DIAGNOSIS — K5903 Drug induced constipation: Secondary | ICD-10-CM | POA: Diagnosis not present

## 2022-06-11 DIAGNOSIS — K5901 Slow transit constipation: Secondary | ICD-10-CM | POA: Diagnosis not present

## 2022-06-11 DIAGNOSIS — R400 Somnolence: Secondary | ICD-10-CM | POA: Diagnosis present

## 2022-06-11 DIAGNOSIS — I63232 Cerebral infarction due to unspecified occlusion or stenosis of left carotid arteries: Secondary | ICD-10-CM | POA: Diagnosis not present

## 2022-06-11 DIAGNOSIS — Z66 Do not resuscitate: Secondary | ICD-10-CM | POA: Diagnosis present

## 2022-06-11 DIAGNOSIS — I480 Paroxysmal atrial fibrillation: Secondary | ICD-10-CM | POA: Diagnosis present

## 2022-06-11 DIAGNOSIS — I130 Hypertensive heart and chronic kidney disease with heart failure and stage 1 through stage 4 chronic kidney disease, or unspecified chronic kidney disease: Secondary | ICD-10-CM | POA: Diagnosis present

## 2022-06-11 DIAGNOSIS — I69351 Hemiplegia and hemiparesis following cerebral infarction affecting right dominant side: Secondary | ICD-10-CM | POA: Diagnosis not present

## 2022-06-11 DIAGNOSIS — R29721 NIHSS score 21: Secondary | ICD-10-CM | POA: Diagnosis not present

## 2022-06-11 DIAGNOSIS — G9340 Encephalopathy, unspecified: Secondary | ICD-10-CM | POA: Diagnosis not present

## 2022-06-11 DIAGNOSIS — Z7409 Other reduced mobility: Secondary | ICD-10-CM | POA: Diagnosis present

## 2022-06-11 LAB — GLUCOSE, CAPILLARY
Glucose-Capillary: 99 mg/dL (ref 70–99)
Glucose-Capillary: 101 mg/dL — ABNORMAL HIGH (ref 70–99)
Glucose-Capillary: 140 mg/dL — ABNORMAL HIGH (ref 70–99)
Glucose-Capillary: 105 mg/dL — ABNORMAL HIGH (ref 70–99)

## 2022-06-11 MED ORDER — ACETAMINOPHEN 160 MG/5ML PO SOLN: 650.0000 mg | ORAL | Status: DC | PRN

## 2022-06-11 MED ORDER — AMIODARONE HCL 200 MG PO TABS
100.0000 mg | ORAL_TABLET | Freq: Every day | ORAL | Status: DC
  Administered 2022-06-12 – 2022-06-25 (×14): 100 mg via ORAL
  Filled 2022-06-11 (×14): qty 1

## 2022-06-11 MED ORDER — SACUBITRIL-VALSARTAN 24-26 MG PO TABS
1.0000 | ORAL_TABLET | Freq: Two times a day (BID) | ORAL | Status: DC
  Administered 2022-06-11 – 2022-06-25 (×28): 1 via ORAL
  Filled 2022-06-11 (×28): qty 1

## 2022-06-11 MED ORDER — APIXABAN 5 MG PO TABS
5.0000 mg | ORAL_TABLET | Freq: Two times a day (BID) | ORAL | Status: DC
  Administered 2022-06-11 – 2022-06-25 (×28): 5 mg via ORAL
  Filled 2022-06-11 (×28): qty 1

## 2022-06-11 MED ORDER — DOCUSATE SODIUM 100 MG PO CAPS
100.0000 mg | ORAL_CAPSULE | Freq: Two times a day (BID) | ORAL | Status: DC
  Administered 2022-06-11 – 2022-06-25 (×27): 100 mg via ORAL
  Filled 2022-06-11 (×27): qty 1

## 2022-06-11 MED ORDER — SENNOSIDES-DOCUSATE SODIUM 8.6-50 MG PO TABS
1.0000 | ORAL_TABLET | Freq: Every evening | ORAL | Status: DC | PRN
  Administered 2022-06-13 – 2022-06-14 (×2): 1 via ORAL
  Filled 2022-06-11 (×2): qty 1

## 2022-06-11 MED ORDER — LEVOTHYROXINE SODIUM 112 MCG PO TABS
112.0000 ug | ORAL_TABLET | Freq: Every day | ORAL | Status: DC
  Administered 2022-06-12 – 2022-06-19 (×8): 112 ug
  Filled 2022-06-11 (×8): qty 1

## 2022-06-11 MED ORDER — ATORVASTATIN CALCIUM 80 MG PO TABS
80.0000 mg | ORAL_TABLET | Freq: Every day | ORAL | Status: DC
  Administered 2022-06-12 – 2022-06-25 (×14): 80 mg via ORAL
  Filled 2022-06-11 (×14): qty 1

## 2022-06-11 MED ORDER — ACETAMINOPHEN 650 MG RE SUPP: 650.0000 mg | RECTAL | Status: DC | PRN

## 2022-06-11 MED ORDER — ACETAMINOPHEN 325 MG PO TABS
650.0000 mg | ORAL_TABLET | ORAL | Status: DC | PRN
  Administered 2022-06-17 – 2022-06-19 (×3): 650 mg via ORAL
  Filled 2022-06-11 (×4): qty 2

## 2022-06-11 MED ORDER — POLYETHYLENE GLYCOL 3350 17 G PO PACK
17.0000 g | PACK | Freq: Every day | ORAL | Status: DC
  Administered 2022-06-12 – 2022-06-25 (×13): 17 g via ORAL
  Filled 2022-06-11 (×14): qty 1

## 2022-06-11 MED ORDER — FAMOTIDINE 20 MG PO TABS
20.0000 mg | ORAL_TABLET | Freq: Every day | ORAL | Status: DC
  Administered 2022-06-12 – 2022-06-25 (×14): 20 mg via ORAL
  Filled 2022-06-11 (×14): qty 1

## 2022-06-11 NOTE — Progress Notes (Signed)
Meredith Staggers, MD  Physician Physical Medicine and Rehabilitation   PMR Pre-admission    Signed   Date of Service: 06/06/2022  4:25 PM  Related encounter: ED to Hosp-Admission (Discharged) from 06/04/2022 in [facility]   Signed      Show:Clear all _0 Written_1 Templated_2 Copied  Added by: _3 Cristina Gong, RN_4 Lind Covert, Lauren Mamie Nick, CCC-SLP_5 Meredith Staggers, MD  _6 Hover for details PMR Admission Coordinator Pre-Admission Assessment   Patient: Rebekah Pope is an 82 y.o., female MRN: [id number] DOB: 06-Nov-1940 Height: _7  (160 cm) Weight: 87.8 kg   Insurance Information HMO:     PPO: yes     PCP:      IPA:      80/20:      OTHER:  PRIMARY: [employer]      Policy#: [id number]      Subscriber: patient CM Name: Mendel Ryder      Phone#: [phone] ext [phone]     Fax#: [phone] Pre-Cert#: [id number] approved for 7 days f/u with Luster Landsberg phone [phone] fax [phone]     Employer:  Benefits:  Phone #: online at [email or web address]     Name:  [place]. Date: 06/09/19-still active     Deduct: does not have      Out of Pocket Max: $4,00 ($0 met)      Life Max: NA CIR: $160/day co-pay with a max co-pay of $1,600/admission      SNF: 100% coverage for days 1-20, $50/day co-pay for days 21-100 Outpatient: $20/visit co-pay     Co-Pay:  Home Health: 100% coverage      Co-Pay:  DME: 80% coverage     Co-Pay: 20% co-insurance Providers: in-network SECONDARY:       Policy#:      Phone#:    Development worker, community:       Phone#:    The Engineer, petroleum" for patients in Inpatient Rehabilitation Facilities with attached "Privacy Act [place] Records" was provided and verbally reviewed with: Patient and Family   Emergency Contact Information Contact Information       Name Relation Home Work Mobile    Hedstrom,RICHARD Spouse [place] Daughter     [place] Son     [phone]    Markeisha, Mancias     [phone]         Current Medical History  Patient Admitting Diagnosis: CVA   History of Present Illness:  82 year old right-handed female with history of ischemic cerebrovascular accident receiving inpatient rehab services 05/12/2022 - 05/21/2022 and underwent IR thrombectomy with a Zio patch placed as well as history of sick sinus syndrome, paroxysmal atrial fibrillation on anticoagulation with DOAC as well as amiodarone, hypertension, CKD with creatinine baseline 1.20-1.30, hyperlipidemia, obstructive CAD, diastolic congestive heart failure, 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 Presented 06/04/2022 with altered mental status, leftward gaze and right-sided weakness/aphasia.     Cranial CT scan showed no definite evidence of involved left MCA infarction on motion degraded initial contrast head CT.  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  Occluded bilateral PCA shortly after their origins with diminutive reconstitution of flow in distal vessels favored to be chronic.  Focal high-grade stenosis of the left A3 segment.  Patient underwent revascularization per interventional radiology.  Echocardiogram with ejection fraction of 60 to 65% no wall motion abnormalities.  Admission chemistries unremarkable except glucose 108, urine drug screen negative, urinalysis negative nitrite.  Neurology follow-up patient remains on Eliquis as prior to admission.  Zio patch with no A-fib detected.  Tolerating a regular diet.   Complete NIHSS TOTAL: 6   Patient's medical record from [facility] has been reviewed by the rehabilitation admission coordinator and physician.   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 Burt Knack & Dr. Cyndia Bent   Severe aortic stenosis     Sinus bradycardia     Stroke (cerebrum) ([place])      Has the patient had major surgery during 100 days prior to admission? Yes   Family History   family history includes Dementia in her mother; Heart disease in her father; Hip fracture in her mother; Pneumonia in her mother.   Current Medications   Current Facility-Administered Medications:    0.9 %  sodium chloride infusion, 250 mL, Intravenous, Continuous, Payne, John D, PA-C   acetaminophen (TYLENOL) tablet 650 mg, 650 mg, Oral, Q4H PRN, 650 mg at 06/09/22 2159 **OR** acetaminophen (TYLENOL) 160 MG/5ML solution 650 mg, 650 mg, Per Tube, Q4H PRN **OR** acetaminophen (TYLENOL) suppository 650 mg, 650 mg, Rectal, Q4H PRN, Amie Portland, MD   amiodarone (PACERONE) tablet 100 mg, 100 mg, Oral, Daily, Rosalin Hawking, MD, 100 mg at 06/11/22 1011   apixaban (ELIQUIS) tablet 5 mg, 5 mg, Oral, BID, Beulah Gandy A, NP, 5 mg at 06/11/22 1011   atorvastatin (LIPITOR) tablet 80 mg, 80 mg, Oral, Daily, Mick Sell, PA-C, 80 mg at 06/11/22 1010   Chlorhexidine Gluconate Cloth 2 % PADS 6 each, 6 each, Topical, Daily, Amie Portland, MD, 6 each at 06/09/22 1001   docusate sodium (COLACE) capsule 100 mg, 100 mg, Oral, BID, Beulah Gandy A, NP, 100 mg at 06/11/22 1010   famotidine (PEPCID) tablet 20 mg, 20 mg, Oral, Daily, Rollene Rotunda,  John D, PA-C, 20 mg at 06/11/22 1010   hydrALAZINE (APRESOLINE) injection 10 mg, 10 mg, Intravenous, Q4H PRN, Beulah Gandy A, NP, 10 mg at 06/10/22 2005   iohexol (OMNIPAQUE) 350 MG/ML injection 75 mL, 75 mL, Intravenous, Once PRN, Amie Portland, MD   levothyroxine (SYNTHROID) tablet 112 mcg, 112 mcg, Per Tube, QAC breakfast, Mick Sell, PA-C, 112 mcg at 06/11/22 0547   Oral care mouth rinse, 15 mL, Mouth Rinse, PRN, Rosalin Hawking, MD   polyethylene glycol (MIRALAX / GLYCOLAX) packet 17 g, 17 g, Oral, Daily, Beulah Gandy A, NP, 17 g at 06/11/22 1010   potassium chloride SA (KLOR-CON M) CR tablet 20 mEq, 20 mEq,  Oral, BID, Lovey Newcomer C, NP, 20 mEq at 06/11/22 1010   sacubitril-valsartan (ENTRESTO) 24-26 mg per tablet, 1 tablet, Oral, BID, Garvin Fila, MD, 1 tablet at 06/11/22 1010   senna-docusate (Senokot-S) tablet 1 tablet, 1 tablet, Oral, QHS PRN, Amie Portland, MD   Patients Current Diet:  Diet Order                  Diet Heart Room service appropriate? Yes; Fluid consistency: Thin  Diet effective now                       Precautions / Restrictions Precautions Precautions: Fall, Other (comment) Precaution Comments: mild diplopia with impaired oculomotor function Restrictions Weight Bearing Restrictions: No    Has the patient had 2 or more falls or a fall with injury in the past year? Yes   Prior Activity Level Limited Community (1-2x/wk): doctor's appointments   Prior Functional Level Self Care: Did the patient need help bathing, dressing, using the toilet or eating? Independent   Indoor Mobility: Did the patient need assistance with walking from room to room (with or without device)? Independent   Stairs: Did the patient need assistance with internal or external stairs (with or without device)? Needed some help   Functional Cognition: Did the patient need help planning regular tasks such as shopping or remembering to take medications? Independent   Patient  Information Are you of Hispanic, Latino/a,or Spanish origin?: X. Patient unable to respond, A. No, not of Hispanic, Latino/a, or Spanish origin What is your race?: X. Patient unable to respond, A. White Do you need or want an interpreter to communicate with a doctor or health care staff?: 9. Unable to respond   Patient's Response To:  Health Literacy and Transportation Is the patient able to respond to health literacy and transportation needs?: No Health Literacy - How often do you need to have someone help you when you read instructions, pamphlets, or other written material from your doctor or pharmacy?: Patient unable to respond (per daughter, pt was able to read medical information independently) In the past 12 months, has lack of transportation kept you from medical appointments or from getting medications?:  (per daughter, pt has not missed any medical appointments d/t transportation issues) In the past 12 months, has lack of transportation kept you from meetings, work, or from getting things needed for daily living?:  (per daughter, pt has not missed any non-medical appointments d/t transportation issues)   [place] / [place] Devices/Equipment: Environmental consultant (specify type) Home Equipment: Rolling Walker (2 wheels), [employer] - single point   Prior Device Use: Indicate devices/aids used by the patient prior to current illness, exacerbation or injury? Walker   Current Functional Level Cognition   Overall Cognitive Status: Difficult to assess (aphasic) Difficult to assess due to: Impaired communication Orientation Level: Oriented X4 Following Commands: Follows one step commands with increased time General Comments: follows commands with increased time, difficult to assess due to aphasia    Extremity Assessment (includes Sensation/Coordination)   Upper Extremity Assessment: Generalized weakness, RUE deficits/detail RUE Deficits / Details: can reach ~110* overhead  with RUE, can bring hand to mouth, can open/close hand. Decreased coordination compared to LUE. Crepitus noted in R shoulder with WB on RW RUE Sensation: decreased light touch RUE Coordination: decreased fine motor, decreased gross motor  Lower Extremity Assessment: Defer to PT evaluation RLE Deficits / Details: grossly 3+/5 LLE Deficits / Details: grossly 4-/5  ADLs   Overall ADL's : Needs assistance/impaired Eating/Feeding: Moderate assistance Grooming: Set up, Sitting, Wash/dry face, Brushing hair Grooming Details (indicate cue type and reason): combing hair upon arrival with LUE, smoothing with RUE, washing face with LUE Upper Body Bathing: Moderate assistance Lower Body Bathing: Maximal assistance Upper Body Dressing : Moderate assistance Lower Body Dressing: Moderate assistance Lower Body Dressing Details (indicate cue type and reason): donning L sock at bed level using figure 4 Toilet Transfer: Moderate assistance, Stand-pivot, Rolling walker (2 wheels), BSC/3in1 Toilet Transfer Details (indicate cue type and reason): simulated via functional mobility to chair Toileting- Clothing Manipulation and Hygiene: Maximal assistance Toileting - Clothing Manipulation Details (indicate cue type and reason): pericare in standing, incontinent Functional mobility during ADLs: Moderate assistance, Rolling walker (2 wheels), Cueing for safety, Cueing for sequencing     Mobility   Overal bed mobility: Needs Assistance Bed Mobility: Supine to Sit Supine to sit: Min assist Sit to supine: Mod assist, +2 for physical assistance General bed mobility comments: mod cues to scoot to EOB     Transfers   Overall transfer level: Needs assistance Equipment used: Rolling walker (2 wheels) Transfers: Sit to/from Stand, Bed to chair/wheelchair/BSC Sit to Stand: Mod assist Bed to/from chair/wheelchair/BSC transfer type:: Step pivot Step pivot transfers: Mod assist General transfer comment: perfromed x2  to [facility] and to chair     Ambulation / Gait / Stairs / Wheelchair Mobility         Posture / Balance Dynamic Sitting Balance Sitting balance - Comments: sits unsupported without LOB Balance Overall balance assessment: Needs assistance Sitting-balance support: No upper extremity supported, Feet supported Sitting balance-Leahy Scale: Fair Sitting balance - Comments: sits unsupported without LOB Standing balance support: Bilateral upper extremity supported, Reliant on assistive device for balance Standing balance-Leahy Scale: Poor Standing balance comment: reliant on external support     Special needs/care consideration      Previous Home Environment  Living Arrangements: Spouse/significant other  Lives With: Spouse Available Help at Discharge: Family, Available 24 hours/day Type of Home: House Home Layout: One level, Laundry or work area in basement Home Access: Stairs to enter Entrance Stairs-Rails: None Technical brewer of Steps: 3 Bathroom Shower/Tub: Chiropodist: Handicapped height Bathroom Accessibility: Yes How Accessible: Accessible via walker Home Care Services: No Type of Home Care Services: Home OT, Home PT, [place] (if known): [facility] Additional Comments: live wtih husband, son is 2 miles away   Discharge Living Setting Plans for Discharge Living Setting: Patient's home Type of Home at Discharge: House Discharge Home Layout: One level, Laundry or work area in basement Discharge Home Access: Stairs to enter Entrance Stairs-Rails: None Technical brewer of Steps: 3 Discharge Bathroom Shower/Tub: Tub/shower unit Discharge Bathroom Toilet: Handicapped height Discharge Bathroom Accessibility: Yes How Accessible: Accessible via walker Does the patient have any problems obtaining your medications?: No   Social/Family/Support Systems Patient Roles: Spouse Anticipated Caregiver: Madalyne Husk,  husband Anticipated Caregiver's Contact Information: [phone] Caregiver Availability: 24/7 Discharge Plan Discussed with Primary Caregiver: Yes Is Caregiver In Agreement with Plan?: Yes Does Caregiver/Family have Issues with Lodging/Transportation while Pt is in Rehab?: No   Goals Patient/Family Goal for Rehab: Supervision-Min A: PT/OT, Min A: ST Expected length of stay: 14-16 days Pt/Family Agrees to Admission and willing to participate: Yes Program Orientation Provided & Reviewed with Pt/Caregiver Including Roles  & Responsibilities: Yes   Decrease burden of Care through IP rehab admission: NA   Possible need for  SNF placement upon discharge: Not anticipated   Patient Condition: I have reviewed medical records from [facility], spoken with CM, and patient, spouse, daughter, and family member. I met with patient at the bedside and discussed via phone for inpatient rehabilitation assessment.  Patient will benefit from ongoing PT, OT, and SLP, can actively participate in 3 hours of therapy a day 5 days of the week, and can make measurable gains during the admission.  Patient will also benefit from the coordinated team approach during an Inpatient Acute Rehabilitation admission.  The patient will receive intensive therapy as well as Rehabilitation physician, nursing, social worker, and care management interventions.  Due to bladder management, safety, skin/wound care, disease management, medication administration, pain management, and patient education the patient requires 24 hour a day rehabilitation nursing.  The patient is currently mod assist overall with mobility and basic ADLs.  Discharge setting and therapy post discharge at home with home health is anticipated.  Patient has agreed to participate in the Acute Inpatient Rehabilitation Program and will admit today.   Preadmission Screen Completed By: Gayland Curry with updates by Danne Baxter RN MSN Lauren Danford Bad,  06/11/2022 10:26 AM ______________________________________________________________________   Discussed status with Dr. Naaman Plummer on 06/11/22 at 1027 and received approval for admission today.   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 Yes Co-Morbidities requiring supervision/potential complications: htn, ckdiii, anemia Due to bladder management, bowel management, safety, skin/wound care, disease management, medication administration, pain management, and patient education, does the patient require 24 hr/day rehab nursing? Yes Does the patient require coordinated care of a physician, rehab nurse, PT, OT, and SLP to address physical and functional deficits in the context of the above medical diagnosis(es)? Yes Addressing deficits in the following areas: balance, endurance, locomotion, strength, transferring, bowel/bladder control, bathing, dressing, feeding, grooming, toileting, cognition, language, and psychosocial support Can the patient actively participate in an intensive therapy program of at least 3 hrs of therapy 5 days a week? Yes The potential for patient to make measurable gains while on inpatient rehab is excellent Anticipated functional outcomes upon discharge from inpatient rehab: supervision and min assist PT, supervision and min assist OT, min assist SLP Estimated rehab length of stay to reach the above functional goals is: 14-16 days Anticipated discharge destination: Home 10. Overall Rehab/Functional Prognosis: excellent     MD Signature: Meredith Staggers, MD, [place] Director Rehabilitation Services 06/11/2022          Revision History

## 2022-06-11 NOTE — Progress Notes (Signed)
Inpatient Rehabilitation Admissions Coordinator   I met with patient at bedside and she is in agreement to admit to CIR today. I have alerted acute team and TOC. I will make the arrangements.  Danne Baxter, RN, MSN Rehab Admissions Coordinator [phone] 06/11/2022 10:40 AM

## 2022-06-11 NOTE — Progress Notes (Signed)
Discharged to 4 MW.  Nurses came to pick her up.  Report was called by nurse.

## 2022-06-11 NOTE — Progress Notes (Signed)
To rehab unit per bed accompanied by NT and RN. Alert patient with family at bed side. Oriented to unit set up.

## 2022-06-11 NOTE — H&P (Signed)
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  Per chart review lives with spouse.  1 level home one-step to entry.  Ambulatory with rolling walker.  Presented 06/04/2022 with altered mental status, leftward gaze and right-sided weakness/aphasia.  Cranial CT scan showed no definite evidence of involved left MCA infarction on motion degraded initial contrast head CT.  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  Occluded bilateral PCA shortly after their origins with diminutive reconstitution of flow in distal vessels favored to be chronic.  Focal high-grade stenosis of the left A3 segment.  Patient underwent revascularization per interventional radiology.  Echocardiogram with ejection fraction of 60 to 65% no wall motion abnormalities.  Admission chemistries unremarkable except glucose 108, urine drug screen negative, urinalysis negative nitrite.  Neurology follow-up patient remains on Eliquis as prior to admission.  Zio patch with no A-fib detected.  Tolerating a regular diet.  Therapy evaluations completed due to patient decreased functional ability right-sided weakness and aphasia was admitted for a comprehensive rehab program.   Review of Systems  Constitutional:  Negative  for chills and fever.  HENT:  Negative for hearing loss.   Eyes:  Negative for blurred vision and double vision.  Respiratory:  Negative for cough, shortness of breath and wheezing.   Cardiovascular:  Positive for palpitations and leg swelling. Negative for chest pain.  Gastrointestinal:  Positive for constipation. Negative for heartburn, nausea and vomiting.  Genitourinary:  Negative for dysuria, flank pain and hematuria.  Musculoskeletal:  Positive for joint pain and myalgias.  Skin:  Negative for rash.  Neurological:  Positive for dizziness, speech change and weakness.  All other systems reviewed and are negative.       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 Burt Knack & Dr. Cyndia Bent   Severe aortic stenosis     Sinus bradycardia     Stroke (cerebrum) [facility])           Past Surgical History:  Procedure Laterality Date   CESAREAN SECTION       INTRAOPERATIVE TRANSTHORACIC ECHOCARDIOGRAM N/A  02/10/2022    Procedure: INTRAOPERATIVE TRANSTHORACIC ECHOCARDIOGRAM;  Surgeon: Sherren Mocha, MD;  Location: [place] [facility];  Service: Open Heart Surgery;  Laterality: N/A;   IR ANGIO VERTEBRAL SEL VERTEBRAL UNI L MOD SED   06/04/2022   IR CT HEAD LTD   06/04/2022   IR PERCUTANEOUS ART THROMBECTOMY/INFUSION INTRACRANIAL INC DIAG ANGIO   06/04/2022   IR US GUIDE VASC ACCESS RIGHT    06/04/2022   RADIOLOGY WITH ANESTHESIA N/A 06/04/2022    Procedure: IR WITH ANESTHESIA;  Surgeon: Radiologist, Medication, MD;  Location: [place];  Service: Radiology;  Laterality: N/A;   RIGHT HEART CATH AND CORONARY ANGIOGRAPHY N/A 10/31/2021    Procedure: RIGHT HEART CATH AND CORONARY ANGIOGRAPHY;  Surgeon: Burnell Blanks, MD;  Location: [place] [facility];  Service: Cardiovascular;  Laterality: N/A;   TRANSCATHETER AORTIC VALVE REPLACEMENT, TRANSFEMORAL N/A 02/10/2022    Procedure: Transcatheter Aortic Valve Replacement, Transfemoral;  Surgeon: Sherren Mocha, MD;  Location: [place] [facility];  Service: Open Heart Surgery;  Laterality: N/A;         Family History  Problem Relation Age of Onset   Dementia Mother     Pneumonia Mother     Hip fracture Mother     Heart disease Father      Social History:  reports that she has never smoked. She has been exposed to tobacco smoke. She has never used smokeless tobacco. She reports that she does not drink alcohol and does not use drugs. Allergies: No Known Allergies       Medications Prior to Admission  Medication Sig Dispense Refill   acetaminophen (TYLENOL) 325 MG tablet Take 2 tablets (650 mg total) by mouth every 6 (six) hours as needed for mild pain, fever or headache.       amiodarone (PACERONE) 200 MG tablet Take 0.5 tablets (100 mg total) by mouth daily. 30 tablet 0   amLODipine (NORVASC) 10 MG tablet Take 1 tablet (10 mg total) by mouth at bedtime. 30 tablet 0   apixaban (ELIQUIS) 5 MG TABS tablet Take 1 tablet (5 mg total) by mouth 2 (two) times daily. 60 tablet 0   atorvastatin (LIPITOR) 80 MG tablet Take 1 tablet (80 mg total) by mouth daily. 30 tablet 0   ENTRESTO 24-26 MG Take 1 tablet by mouth 2 (two) times daily. 60 tablet 0   hydrALAZINE (APRESOLINE) 25 MG tablet Take 1 tablet (25 mg total) by mouth 3 (three) times daily. 90 tablet 0   levothyroxine (SYNTHROID) 112 MCG tablet Take 1 tablet (112 mcg total) by mouth daily  before breakfast. 30 tablet 0   oxyCODONE (OXY IR/ROXICODONE) 5 MG immediate release tablet Take 1 tablet (5 mg total) by mouth every 4 (four) hours as needed for moderate pain or severe pain. 30 tablet 0          Home: Home Living Family/patient expects to be discharged to:: Skilled nursing facility Living Arrangements: Spouse/significant other Available Help at Discharge: Family, Available 24 hours/day Type of Home: House Home Access: Stairs to enter [employer] of Steps: 3 Entrance Stairs-Rails: None Home Layout: One level, Laundry or work area in basement [employer] Shower/Tub: Chiropodist: Handicapped height Bathroom Accessibility: Yes Home Equipment: Conservation officer, nature (2 wheels), [employer] - single point Additional Comments: live wtih husband, son is 2 miles away  Lives With: Spouse   Functional History: Prior Function Prior Level of Function : Independent/Modified Independent Mobility Comments: ambulatory with RW, continued to have  intermittent dizzy spells ADLs Comments: performing ADLs independently although not getting into bathtub; family states she was having HH?   Functional Status:  Mobility: Bed Mobility Overal bed mobility: Needs Assistance Bed Mobility: Supine to Sit Supine to sit: Min assist Sit to supine: Mod assist, +2 for physical assistance General bed mobility comments: mod cues to scoot to EOB Transfers Overall transfer level: Needs assistance Equipment used: Rolling walker (2 wheels) Transfers: Sit to/from Stand, Bed to chair/wheelchair/BSC Sit to Stand: Mod assist Bed to/from chair/wheelchair/BSC transfer type:: Step pivot Step pivot transfers: Mod assist General transfer comment: perfromed x2 to [facility] and to chair   ADL: ADL Overall ADL's : Needs assistance/impaired Eating/Feeding: Moderate assistance Grooming: Set up, Sitting, Wash/dry face, Brushing hair Grooming Details (indicate cue type and reason): combing hair  upon arrival with LUE, smoothing with RUE, washing face with LUE Upper Body Bathing: Moderate assistance Lower Body Bathing: Maximal assistance Upper Body Dressing : Moderate assistance Lower Body Dressing: Moderate assistance Lower Body Dressing Details (indicate cue type and reason): donning L sock at bed level using figure 4 Toilet Transfer: Moderate assistance, Stand-pivot, Rolling walker (2 wheels), BSC/3in1 Toilet Transfer Details (indicate cue type and reason): simulated via functional mobility to chair Toileting- Clothing Manipulation and Hygiene: Maximal assistance Toileting - Clothing Manipulation Details (indicate cue type and reason): pericare in standing, incontinent Functional mobility during ADLs: Moderate assistance, Rolling walker (2 wheels), Cueing for safety, Cueing for sequencing   Cognition: Cognition Overall Cognitive Status: Difficult to assess (aphasic) Orientation Level: Disoriented to time Cognition Arousal/Alertness: Awake/alert Behavior During Therapy: Flat affect Overall Cognitive Status: Difficult to assess (aphasic) Area of Impairment: Following commands, Problem solving Following Commands: Follows one step commands with increased time Problem Solving: Slow processing, Decreased initiation, Requires verbal cues, Difficulty sequencing, Requires tactile cues General Comments: follows commands with increased time, difficult to assess due to aphasia Difficult to assess due to: Impaired communication   Physical Exam: Blood pressure (!) 145/63, pulse (!) 55, temperature 98.6 F (37 C), resp. rate 18, height 5\' 3"  (1.6 m), weight 87.8 kg, SpO2 98 %. Physical Exam Constitutional:      General: She is not in acute distress.    Appearance: She is obese.  HENT:     Head:     Comments: Bruising over right cheek.    Right Ear: External ear normal.     Left Ear: External ear normal.     Nose: Nose normal.     Mouth/Throat:     Mouth: Mucous membranes are  moist.  Eyes:     Extraocular Movements: Extraocular movements intact.     Pupils: Pupils are equal, round, and reactive to light.  Cardiovascular:     Rate and Rhythm: Regular rhythm. Bradycardia present.     Heart sounds: No murmur heard.    No gallop.  Pulmonary:     Effort: Pulmonary effort is normal. No respiratory distress.     Breath sounds: No wheezing.  Abdominal:     General: Bowel sounds are normal. There is no distension.     Palpations: Abdomen is soft.     Tenderness: There is no abdominal tenderness.  Musculoskeletal:        General: No swelling or tenderness.     Cervical back: Normal range of motion.  Skin:    Comments: Bruising right cheek. Chronic lesion left cheek  Neurological:     Mental Status: She is alert.     Comments: Pt is alert. Follows basic  commands. Expressive>>receptive language deficits. Generally garbled speech but better with automatic responses. Speech fairly clear. RUE 4/5. LUE 4+/5. RLE 4- to 4/5. LLLE 4+/5. No focal sensory changes. DTR's 1+.      Patient is alert.  She can provide her name but not place or time.  Significant word hesitancy/aphasia.  Psychiatric:        Mood and Affect: Mood normal.        Behavior: Behavior normal.        Lab Results Last 48 Hours        Results for orders placed or performed during the hospital encounter of 06/04/22 (from the past 48 hour(s))  Glucose, capillary     Status: Abnormal    Collection Time: 06/09/22  8:33 AM  Result Value Ref Range    Glucose-Capillary 129 (H) 70 - 99 mg/dL      Comment: Glucose reference range applies only to samples taken after fasting for at least 8 hours.  Glucose, capillary     Status: Abnormal    Collection Time: 06/09/22 12:10 PM  Result Value Ref Range    Glucose-Capillary 106 (H) 70 - 99 mg/dL      Comment: Glucose reference range applies only to samples taken after fasting for at least 8 hours.  Glucose, capillary     Status: Abnormal    Collection Time:  06/09/22  4:15 PM  Result Value Ref Range    Glucose-Capillary 109 (H) 70 - 99 mg/dL      Comment: Glucose reference range applies only to samples taken after fasting for at least 8 hours.  Glucose, capillary     Status: Abnormal    Collection Time: 06/10/22 12:21 AM  Result Value Ref Range    Glucose-Capillary 130 (H) 70 - 99 mg/dL      Comment: Glucose reference range applies only to samples taken after fasting for at least 8 hours.    Comment 1 Notify RN      Comment 2 Document in Chart    Glucose, capillary     Status: None    Collection Time: 06/10/22  4:09 AM  Result Value Ref Range    Glucose-Capillary 89 70 - 99 mg/dL      Comment: Glucose reference range applies only to samples taken after fasting for at least 8 hours.  Glucose, capillary     Status: None    Collection Time: 06/10/22  8:29 AM  Result Value Ref Range    Glucose-Capillary 94 70 - 99 mg/dL      Comment: Glucose reference range applies only to samples taken after fasting for at least 8 hours.    Comment 1 Notify RN      Comment 2 Document in Chart    Glucose, capillary     Status: Abnormal    Collection Time: 06/10/22 12:08 PM  Result Value Ref Range    Glucose-Capillary 151 (H) 70 - 99 mg/dL      Comment: Glucose reference range applies only to samples taken after fasting for at least 8 hours.    Comment 1 Notify RN      Comment 2 Document in Chart    Glucose, capillary     Status: Abnormal    Collection Time: 06/10/22  8:25 PM  Result Value Ref Range    Glucose-Capillary 110 (H) 70 - 99 mg/dL      Comment: Glucose reference range applies only to samples taken after fasting for at least 8 hours.  Glucose, capillary     Status: None    Collection Time: 06/10/22 11:31 PM  Result Value Ref Range    Glucose-Capillary 98 70 - 99 mg/dL      Comment: Glucose reference range applies only to samples taken after fasting for at least 8 hours.  Glucose, capillary     Status: None    Collection Time: 06/11/22  3:38  AM  Result Value Ref Range    Glucose-Capillary 99 70 - 99 mg/dL      Comment: Glucose reference range applies only to samples taken after fasting for at least 8 hours.      Imaging Results (Last 48 hours)  No results found.         Blood pressure (!) 145/63, pulse (!) 55, temperature 98.6 F (37 C), resp. rate 18, height 5\' 3"  (1.6 m), weight 87.8 kg, SpO2 98 %.   Medical Problem List and Plan: 1. Functional deficits secondary to bilateral left greater than right acute scattered punctate infarct due to left ICA and MCA occlusion status post revascularization as well as history of ischemic cerebrovascular accident December 2023             -patient may  shower             -ELOS/Goals: 14-16 days, min assist to sup goals with PT, OT, min assist with SLP 2.  Antithrombotics: -DVT/anticoagulation: Eliquis             -antiplatelet therapy: N/A 3. Pain Management: Tylenol as needed 4. Mood/Behavior/Sleep: Provide emotional support             -antipsychotic agents: N/A 5. Neuropsych/cognition: This patient is not capable of making decisions on her own behalf. 6. Skin/Wound Care: Routine skin checks 7. Fluids/Electrolytes/Nutrition: Routine in and outs with follow-up chemistries 8.  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  Follow-up outpatient cardiology.  Continue amiodarone 100 mg daily 9.  CKD.  Baseline creatinine 1.20-1.30.  Follow-up chemistries on admit. 10.  Diastolic congestive heart failure.  Monitor for any signs of fluid overload.  Continue Entresto 24-26 mg twice daily             -daily weights 11.  Hypothyroidism.  Synthroid 12.  Hyperlipidemia.  Lipitor 13.  Constipation.  MiraLAX daily, Colace 100 mg twice daily     Cathlyn Parsons, PA-C 06/11/2022  I have personally performed a face to face diagnostic evaluation of this patient and formulated the key components of the plan.  Additionally, I have personally reviewed laboratory data, imaging studies, as well  as relevant notes and concur with the physician assistant's documentation above.  The patient's status has not changed from the original H&P.  Any changes in documentation from the acute care chart have been noted above.  Meredith Staggers, MD, [facility]

## 2022-06-11 NOTE — Discharge Instructions (Addendum)
STROKE/TIA DISCHARGE INSTRUCTIONS SMOKING Cigarette smoking nearly doubles your risk of having a stroke & is the single most alterable risk factor  If you smoke or have smoked in the last 12 months, you are advised to quit smoking for your health. Most of the excess cardiovascular risk related to smoking disappears within a year of stopping. Ask you doctor about anti-smoking medications [place] Quit Line: 1-800-QUIT NOW Free Smoking Cessation Classes [phone]  CHOLESTEROL Know your levels; limit fat & cholesterol in your diet  Lipid Panel     Component Value Date/Time   CHOL 131 06/05/2022 0316   TRIG 74 06/05/2022 0316   HDL 65 06/05/2022 0316   CHOLHDL 2.0 06/05/2022 0316   VLDL 15 06/05/2022 0316   LDLCALC 51 06/05/2022 0316     Many patients benefit from treatment even if their cholesterol is at goal. Goal: Total Cholesterol (CHOL) less than 160 Goal:  Triglycerides (TRIG) less than 150 Goal:  HDL greater than 40 Goal:  LDL (LDLCALC) less than 100   BLOOD PRESSURE American Stroke Association blood pressure target is less that 120/80 mm/Hg  Your discharge blood pressure is:  BP: (!) 141/51 Monitor your blood pressure Limit your salt and alcohol intake Many individuals will require more than one medication for high blood pressure  DIABETES (A1c is a blood sugar average for last 3 months) Goal HGBA1c is under 7% (HBGA1c is blood sugar average for last 3 months)  Diabetes: No known diagnosis of diabetes    Lab Results  Component Value Date   HGBA1C 6.0 (H) 05/10/2022    Your HGBA1c can be lowered with medications, healthy diet, and exercise. Check your blood sugar as directed by your physician Call your physician if you experience unexplained or low blood sugars.  PHYSICAL ACTIVITY/REHABILITATION Goal is 30 minutes at least 4 days per week  Activity: Increase activity slowly, Therapies: Physical Therapy: Home Health Return to work:  Activity decreases your risk of heart  attack and stroke and makes your heart stronger.  It helps control your weight and blood pressure; helps you relax and can improve your mood. Participate in a regular exercise program. Talk with your doctor about the best form of exercise for you (dancing, walking, swimming, cycling).  DIET/WEIGHT Goal is to maintain a healthy weight  Your discharge diet is:  Diet Order             Diet Heart Room service appropriate? Yes; Fluid consistency: Thin  Diet effective now                   liquids Your height is:  Height: 5\' 3"  (160 cm) Your current weight is: Weight: 83.2 kg Your Body Mass Index (BMI) is:  BMI (Calculated): 32.5 Following the type of diet specifically designed for you will help prevent another stroke. Your goal weight range is:   Your goal Body Mass Index (BMI) is 19-24. Healthy food habits can help reduce 3 risk factors for stroke:  High cholesterol, hypertension, and excess weight.  RESOURCES Stroke/Support Group:  Call [phone]   STROKE EDUCATION PROVIDED/REVIEWED AND GIVEN TO PATIENT Stroke warning signs and symptoms How to activate emergency medical system (call 911). Medications prescribed at discharge. Need for follow-up after discharge. Personal risk factors for stroke. Pneumonia vaccine given: No Flu vaccine given: No My questions have been answered, the writing is legible, and I understand these instructions.  I will adhere to these goals & educational materials that have been provided to  me after my discharge from the hospital.   Inpatient Rehab Discharge Instructions  Rebekah Pope Discharge date and time: No discharge date for patient encounter.   Activities/Precautions/ Functional Status: Activity: As tolerated Diet: Regular Wound Care: Routine skin checks Functional status:  ___ No restrictions     ___ Walk up steps independently ___ 24/7 supervision/assistance   ___ Walk up steps with assistance ___ Intermittent  supervision/assistance  ___ Bathe/dress independently ___ Walk with walker     _x__ Bathe/dress with assistance ___ Walk Independently    ___ Shower independently ___ Walk with assistance    ___ Shower with assistance ___ No alcohol     ___ Return to work/school ________  Special Instructions: No driving smoking or alcohol   My questions have been answered and I understand these instructions. I will adhere to these goals and the provided educational materials after my discharge from the hospital.  Patient/Caregiver Signature _______________________________ Date __________  Clinician Signature _______________________________________ Date __________  Please bring this form and your medication list with you to all your follow-up doctor's appointments.

## 2022-06-11 NOTE — Plan of Care (Signed)
  Problem: RH BOWEL ELIMINATION Goal: RH STG MANAGE BOWEL WITH ASSISTANCE Description: STG Manage Bowel with mod I Assistance. Outcome: Not Progressing; incontinence   Problem: RH BLADDER ELIMINATION Goal: RH STG MANAGE BLADDER WITH ASSISTANCE Description: STG Manage Bladder With toileting Assistance Outcome: Not Progressing; incontinence   

## 2022-06-11 NOTE — TOC Transition Note (Signed)
Transition of Care [facility]) - CM/SW Discharge Note   Patient Details  Name: Rebekah Pope MRN: [id number] Date of Birth: [birth year]  [facility] CM/SW Contact:  Pollie Friar, RN Phone Number: 06/11/2022, 1:36 PM   Clinical Narrative:    Pt is discharging to CIR today. CM signing off.   Final next level of care: IP Rehab Facility Barriers to Discharge: No Barriers Identified   Patient Goals and CMS Choice CMS Medicare.gov Compare Post Acute Care list provided to:: Patient Choice offered to / list presented to : Patient  Discharge Placement                         Discharge Plan and Services Additional resources added to the After Visit Summary for       Post Acute Care Choice: IP Rehab                               Social Determinants of Health (SDOH) Interventions SDOH Screenings   Food Insecurity: No Food Insecurity (06/08/2022)  Housing: Low Risk  (06/08/2022)  Transportation Needs: No Transportation Needs (06/08/2022)  Utilities: Not At Risk (06/08/2022)  Depression (PHQ2-9): Low Risk  (06/03/2022)  Tobacco Use: Low Risk  (06/08/2022)     Readmission Risk Interventions    02/11/2022   11:09 AM  Readmission Risk Prevention Plan  Post Dischage Appt Complete  Medication Screening Complete  Transportation Screening Complete

## 2022-06-12 DIAGNOSIS — I63512 Cerebral infarction due to unspecified occlusion or stenosis of left middle cerebral artery: Secondary | ICD-10-CM | POA: Diagnosis not present

## 2022-06-12 LAB — CBC WITH DIFFERENTIAL/PLATELET
Abs Immature Granulocytes: 0.02 10*3/uL (ref 0.00–0.07)
Basophils Absolute: 0.1 10*3/uL (ref 0.0–0.1)
Basophils Relative: 1 %
Eosinophils Absolute: 0.6 10*3/uL — ABNORMAL HIGH (ref 0.0–0.5)
Eosinophils Relative: 11 %
HCT: 32 % — ABNORMAL LOW (ref 36.0–46.0)
Hemoglobin: 11 g/dL — ABNORMAL LOW (ref 12.0–15.0)
Immature Granulocytes: 0 %
Lymphocytes Relative: 23 %
Lymphs Abs: 1.3 10*3/uL (ref 0.7–4.0)
MCH: 30.3 pg (ref 26.0–34.0)
MCHC: 34.4 g/dL (ref 30.0–36.0)
MCV: 88.2 fL (ref 80.0–100.0)
Monocytes Absolute: 0.6 10*3/uL (ref 0.1–1.0)
Monocytes Relative: 10 %
Neutro Abs: 3 10*3/uL (ref 1.7–7.7)
Neutrophils Relative %: 55 %
Platelets: 240 10*3/uL (ref 150–400)
RBC: 3.63 MIL/uL — ABNORMAL LOW (ref 3.87–5.11)
RDW: 13.2 % (ref 11.5–15.5)
WBC: 5.6 10*3/uL (ref 4.0–10.5)
nRBC: 0 % (ref 0.0–0.2)

## 2022-06-12 LAB — COMPREHENSIVE METABOLIC PANEL
ALT: 18 U/L (ref 0–44)
AST: 32 U/L (ref 15–41)
Albumin: 2.8 g/dL — ABNORMAL LOW (ref 3.5–5.0)
Alkaline Phosphatase: 82 U/L (ref 38–126)
Anion gap: 5 (ref 5–15)
BUN: 19 mg/dL (ref 8–23)
CO2: 27 mmol/L (ref 22–32)
Calcium: 8.6 mg/dL — ABNORMAL LOW (ref 8.9–10.3)
Chloride: 104 mmol/L (ref 98–111)
Creatinine, Ser: 1.06 mg/dL — ABNORMAL HIGH (ref 0.44–1.00)
GFR, Estimated: 52 mL/min — ABNORMAL LOW (ref >60)
Glucose, Bld: 108 mg/dL — ABNORMAL HIGH (ref 70–99)
Potassium: 4.2 mmol/L (ref 3.5–5.1)
Sodium: 136 mmol/L (ref 135–145)
Total Bilirubin: 0.6 mg/dL (ref 0.3–1.2)
Total Protein: 5.4 g/dL — ABNORMAL LOW (ref 6.5–8.1)

## 2022-06-12 NOTE — Plan of Care (Signed)
  Problem: RH Balance Goal: LTG Patient will maintain dynamic standing with ADLs (OT) Description: LTG:  Patient will maintain dynamic standing balance with assist during activities of daily living (OT)  Flowsheets (Taken 06/12/2022 1709) LTG: Pt will maintain dynamic standing balance during ADLs with: Contact Guard/Touching assist   Problem: Sit to Stand Goal: LTG:  Patient will perform sit to stand in prep for activites of daily living with assistance level (OT) Description: LTG:  Patient will perform sit to stand in prep for activites of daily living with assistance level (OT) Flowsheets (Taken 06/12/2022 1709) LTG: PT will perform sit to stand in prep for activites of daily living with assistance level: Contact Guard/Touching assist   Problem: RH Bathing Goal: LTG Patient will bathe all body parts with assist levels (OT) Description: LTG: Patient will bathe all body parts with assist levels (OT) Flowsheets (Taken 06/12/2022 1709) LTG: Pt will perform bathing with assistance level/cueing: Contact Guard/Touching assist LTG: Position pt will perform bathing: Shower   Problem: RH Dressing Goal: LTG Patient will perform lower body dressing w/assist (OT) Description: LTG: Patient will perform lower body dressing with assist, with/without cues in positioning using equipment (OT) Flowsheets (Taken 06/12/2022 1709) LTG: Pt will perform lower body dressing with assistance level of: Contact Guard/Touching assist   Problem: RH Toileting Goal: LTG Patient will perform toileting task (3/3 steps) with assistance level (OT) Description: LTG: Patient will perform toileting task (3/3 steps) with assistance level (OT)  Flowsheets (Taken 06/12/2022 1709) LTG: Pt will perform toileting task (3/3 steps) with assistance level: Contact Guard/Touching assist   Problem: RH Functional Use of Upper Extremity Goal: LTG Patient will use RT/LT upper extremity as a (OT) Description: LTG: Patient will use right/left upper  extremity as a stabilizer/gross assist/diminished/nondominant/dominant level with assist, with/without cues during functional activity (OT) Flowsheets (Taken 06/12/2022 1709) LTG: Use of upper extremity in functional activities: RUE as dominant level LTG: Pt will use upper extremity in functional activity with assistance level of: Supervision/Verbal cueing   Problem: RH Laundry Goal: LTG Patient will perform laundry w/assist, cues (OT) Description: LTG: Patient will perform laundry with assistance, with/without cues (OT). Flowsheets (Taken 06/12/2022 1709) LTG: Pt will perform laundry with assistance level of: Minimal Assistance - Patient > 75% LTG: Pt will perform laundry with level of: Ambulate with device   Problem: RH Tub/Shower Transfers Goal: LTG Patient will perform tub/shower transfers w/assist (OT) Description: LTG: Patient will perform tub/shower transfers with assist, with/without cues using equipment (OT) 06/12/2022 1713 by Janey Genta, OT Flowsheets (Taken 06/12/2022 1713) LTG: Pt will perform tub/shower stall transfers with assistance level of: Minimal Assistance - Patient > 75%   Problem: RH Memory Goal: LTG Patient will demonstrate ability for day to day recall/carry over during activities of daily living with assistance level (OT) Description: LTG:  Patient will demonstrate ability for day to day recall/carry over during activities of daily living with assistance level (OT). Flowsheets (Taken 06/12/2022 1709) LTG:  Patient will demonstrate ability for day to day recall/carry over during activities of daily living with assistance level (OT): Supervision   Problem: RH Awareness Goal: LTG: Patient will demonstrate awareness during functional activites type of (OT) Description: LTG: Patient will demonstrate awareness during functional activites type of (OT) Flowsheets (Taken 06/12/2022 1709) Patient will demonstrate awareness during functional activites type of: Anticipatory LTG:  Patient will demonstrate awareness during functional activites type of (OT): Supervision

## 2022-06-12 NOTE — Progress Notes (Signed)
Son talked to RN claims his sister positive for RSV and has been with patient yesterday. Patient not showing any symptoms right now. RN called Infection control and said that there is a 3-5 day window before sx will develop and to call PA/ MD for a plan. Dan PA notified.

## 2022-06-12 NOTE — Progress Notes (Signed)
Inpatient Rehabilitation Admission Medication Review by a Pharmacist  A complete drug regimen review was completed for this patient to identify any potential clinically significant medication issues.  High Risk Drug Classes Is patient taking? 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 Entresto- CHF, HTN  Chemotherapy No   Other No  Atorvastatin- HLD Levothyroxine- hypothyroidism Tylenol-pain contrl Famotidine-GERD     Type of Medication Issue Identified Description of Issue Recommendation(s)  Drug Interaction(s) (clinically significant)     Duplicate Therapy     Allergy     No Medication Administration End Date     Incorrect Dose     Additional Drug Therapy Needed     Significant med changes from prior encounter (inform family/care partners about these prior to discharge). Amlodipine , hydralazine prior to admission. Discontinued by MD on [facility] acute admission. Restart PTA meds when and if necessary during CIR admission or at time of discharge, if warranted.   Other       Clinically significant medication issues were identified that warrant physician communication and completion of prescribed/recommended actions by midnight of the next day:  No  Name of provider notified for urgent issues identified:   Provider Method of Notification:     Pharmacist comments:   Time spent performing this drug regimen review (minutes):  20  Thank you for allowing pharmacy to be part of this patients care team. Nicole Cella, RPh Clinical Pharmacist  06/12/2022 12:33 PM

## 2022-06-12 NOTE — Progress Notes (Signed)
Inpatient Rehabilitation Care Coordinator Assessment and Plan Patient Details  Name: Rebekah Pope MRN: [id number] Date of Birth: 1940/10/29  Today's Date: 06/12/2022  Hospital Problems: Principal Problem:   Left middle cerebral artery stroke [facility])  Past Medical History:  Past Medical History:  Diagnosis Date   Allergic rhinitis    CAD (coronary artery disease)    Chronic diastolic CHF (congestive heart failure) (HCC)    Chronic kidney disease, stage 3b (HCC)    Complex regional pain syndrome i of unspecified lower limb    Dizziness and giddiness    Essential (primary) hypertension    History of hiatal hernia    Hyperlipidemia    Hypothyroidism    LBBB (left bundle branch block)    Localized edema    Paroxysmal atrial fibrillation (HCC)    PONV (postoperative nausea and vomiting)    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 Burt Knack & Dr. Cyndia Bent   Severe aortic stenosis    Sinus bradycardia    Stroke (cerebrum) [facility])    Past Surgical History:  Past Surgical History:  Procedure Laterality Date   CESAREAN SECTION     INTRAOPERATIVE TRANSTHORACIC ECHOCARDIOGRAM N/A 02/10/2022   Procedure: INTRAOPERATIVE TRANSTHORACIC ECHOCARDIOGRAM;  Surgeon: Sherren Mocha, MD;  Location: [place] CV LAB;  Service: Open Heart Surgery;  Laterality: N/A;   IR ANGIO VERTEBRAL SEL VERTEBRAL UNI L MOD SED  06/04/2022   IR CT HEAD LTD  06/04/2022   IR PERCUTANEOUS ART THROMBECTOMY/INFUSION INTRACRANIAL INC DIAG ANGIO  06/04/2022   IR US GUIDE VASC ACCESS RIGHT  06/04/2022   RADIOLOGY WITH ANESTHESIA N/A 06/04/2022   Procedure: IR WITH ANESTHESIA;  Surgeon: Radiologist, Medication, MD;  Location: [place];  Service: Radiology;  Laterality: N/A;   RIGHT HEART CATH AND CORONARY ANGIOGRAPHY N/A 10/31/2021   Procedure: RIGHT HEART CATH AND CORONARY ANGIOGRAPHY;  Surgeon: Burnell Blanks, MD;  Location: [place] CV LAB;  Service: Cardiovascular;  Laterality: N/A;   TRANSCATHETER AORTIC VALVE REPLACEMENT, TRANSFEMORAL N/A 02/10/2022   Procedure: Transcatheter Aortic Valve Replacement, Transfemoral;  Surgeon: Sherren Mocha, MD;  Location: [place] CV LAB;  Service: Open Heart Surgery;  Laterality: N/A;   Social History:  reports that she has never smoked. She has been exposed to tobacco smoke. She has never used smokeless tobacco. She reports that she does not drink alcohol and does not use drugs.  Family / Support Systems Patient Roles: Spouse Spouse/Significant Other: Richard Children: [name] (daughter), Elta [place] (son), Coralee Pesa (Son) Anticipated Caregiver: Richard (Spouse) Ability/Limitations of Caregiver: LIGHT MIN A Caregiver Availability: 24/7 Family Dynamics: spouse and children  Social History Preferred language: English Religion:  Health Literacy - How often do you need to have someone help you when you read instructions, pamphlets, or other written material from your doctor or pharmacy?: Always Writes: Yes   Abuse/Neglect Abuse/Neglect Assessment Can Be Completed: Yes Physical Abuse: Denies Verbal Abuse: Denies Sexual Abuse: Denies Exploitation of patient/patient's resources: Denies Self-Neglect: Denies  Patient response to: Social Isolation - How often do you feel lonely or isolated from those around you?: Never  Emotional Status Recent Psychosocial Issues: coping  Patient / Family Perceptions, Expectations & Goals Pt/Family understanding of illness & functional limitations: yes Premorbid pt/family roles/activities: Previous admission discharge home requiring cga/sup Anticipated changes in roles/activities/participation: Spouse able to provide light [place]: None Premorbid Home Care/DME Agencies: Other (Comment) ([place] RW)  Transportation available at discharge: Spouse able to transport. Is the patient able to respond to transportation  needs?: Yes In the past 12 months, has lack of transportation kept you from medical appointments or from getting medications?: No In the past 12 months, has lack of transportation kept you from meetings, work, or from getting things needed for daily living?: No Resource referrals recommended: Neuropsychology  Discharge Planning Living Arrangements: Spouse/significant other Support Systems: Children, Spouse/significant other Type of Residence: Private residence Insurance Resources: Multimedia programmer (specify) ([place]) Financial Screen Referred: No Living Expenses: Own Money Management: Patient, Spouse Does the patient have any problems obtaining your medications?: No Home Management: Recieving some assistance form spouse Patient/Family Preliminary Plans: Spouse able to continue Care Coordinator Barriers to Discharge: Lack of/limited family support, Decreased caregiver support, Insurance for SNF coverage, Home environment access/layout Care Coordinator Anticipated Follow Up Needs: HH/OP Expected length of stay: 14-16 Days  Clinical Impression Patient readmission. Sw met with patient, introduced self and explained role. Spouse not present, sw will follow up. Patient discharging home with assistance and 24/7 from spouse. Spouse able to assist LIGHT MIN A. No additional questions or concerns. Patient previously established with [facility].   [name] 06/12/2022, 1:40 PM

## 2022-06-12 NOTE — Progress Notes (Signed)
Patient ID: Rebekah Pope, female   DOB: 05-20-1941, 82 y.o.   MRN: [id number] Met with the patient to review current situation, rehab process and plan of care. Patient known to rehab as she was previously treated for  CVA through 05/21/22. Now with new left MCA CVA/PCA and expressive aphasia.. Automatic responses are pretty good however detailed responses are unclear and words are garbled but appears to understand questions being asked. Reviewed medications and dietary modifications included in educational notebook. Continue to follow along to address educational needs to facilitate preparation for discharge. Margarito Liner

## 2022-06-12 NOTE — Evaluation (Signed)
Occupational Therapy Assessment and Plan  Patient Details  Name: Rebekah Pope MRN: [id number] Date of Birth: 11/30/1940  OT Diagnosis: apraxia, cognitive deficits, hemiplegia affecting dominant side, and muscle weakness (generalized) Rehab Potential: Rehab Potential (ACUTE ONLY): Good ELOS: 2.5-3 weeks   Today's Date: 06/12/2022 OT Individual Time: 1045-1200 OT Individual Time Calculation (min): 75 min     Hospital Problem: Principal Problem:   Left middle cerebral artery stroke (HCC)   Past Medical History:  Past Medical History:  Diagnosis Date   Allergic rhinitis    CAD (coronary artery disease)    Chronic diastolic CHF (congestive heart failure) (HCC)    Chronic kidney disease, stage 3b (HCC)    Complex regional pain syndrome i of unspecified lower limb    Dizziness and giddiness    Essential (primary) hypertension    History of hiatal hernia    Hyperlipidemia    Hypothyroidism    LBBB (left bundle branch block)    Localized edema    Paroxysmal atrial fibrillation (HCC)    PONV (postoperative nausea and vomiting)    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 Excell Seltzer & Dr. Laneta Simmers   Severe aortic stenosis    Sinus bradycardia    Stroke (cerebrum) [facility])    Past Surgical History:  Past Surgical History:  Procedure Laterality Date   CESAREAN SECTION     INTRAOPERATIVE TRANSTHORACIC ECHOCARDIOGRAM N/A 02/10/2022   Procedure: INTRAOPERATIVE TRANSTHORACIC ECHOCARDIOGRAM;  Surgeon: Tonny Bollman, MD;  Location: [facility] INVASIVE CV LAB;  Service: Open Heart Surgery;  Laterality: N/A;   IR ANGIO VERTEBRAL SEL VERTEBRAL UNI L MOD SED  06/04/2022   IR CT HEAD LTD  06/04/2022   IR PERCUTANEOUS ART THROMBECTOMY/INFUSION INTRACRANIAL INC DIAG ANGIO  06/04/2022   IR US GUIDE VASC ACCESS RIGHT  06/04/2022   RADIOLOGY WITH ANESTHESIA N/A 06/04/2022   Procedure: IR WITH ANESTHESIA;  Surgeon: Radiologist,  Medication, MD;  Location: [facility] OR;  Service: Radiology;  Laterality: N/A;   RIGHT HEART CATH AND CORONARY ANGIOGRAPHY N/A 10/31/2021   Procedure: RIGHT HEART CATH AND CORONARY ANGIOGRAPHY;  Surgeon: Kathleene Hazel, MD;  Location: [facility] INVASIVE CV LAB;  Service: Cardiovascular;  Laterality: N/A;   TRANSCATHETER AORTIC VALVE REPLACEMENT, TRANSFEMORAL N/A 02/10/2022   Procedure: Transcatheter Aortic Valve Replacement, Transfemoral;  Surgeon: Tonny Bollman, MD;  Location: [facility] INVASIVE CV LAB;  Service: Open Heart Surgery;  Laterality: N/A;    Assessment & Plan Clinical Impression: Rebekah Pope is a 82 year old right-handed female with history of ischemic cerebrovascular accident receiving inpatient rehab services 05/12/2022 - 05/21/2022 and underwent IR thrombectomy with a Zio patch placed as well as history of sick sinus syndrome, paroxysmal atrial fibrillation on anticoagulation with DOAC as well as amiodarone, hypertension, CKD with creatinine baseline 1.20-1.30, hyperlipidemia, obstructive CAD, diastolic congestive heart failure, 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  Per chart review lives with spouse.  1 level home one-step to entry.  Ambulatory with rolling walker.  Presented 06/04/2022 with altered mental status, leftward gaze and right-sided weakness/aphasia.  Cranial CT scan showed no definite evidence of involved left MCA infarction on motion degraded initial contrast head CT.  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  Occluded bilateral PCA shortly after their origins with diminutive reconstitution of flow  in distal vessels favored to be chronic.  Focal high-grade stenosis of the left A3 segment.  Patient underwent revascularization per interventional radiology.  Echocardiogram with ejection fraction of 60 to 65% no wall motion  abnormalities.  Admission chemistries unremarkable except glucose 108, urine drug screen negative, urinalysis negative nitrite.  Neurology follow-up patient remains on Eliquis as prior to admission.  Zio patch with no A-fib detected.  Tolerating a regular diet.  Therapy evaluations completed due to patient decreased functional ability right-sided weakness and aphasia was admitted for a comprehensive rehab program. Patient transferred to CIR on 06/11/2022 .    Patient currently requires mod with basic self-care skills secondary to muscle weakness, decreased cardiorespiratoy endurance, motor apraxia, decreased coordination, and decreased motor planning, decreased visual motor skills, decreased motor planning, decreased initiation, decreased attention, decreased awareness, decreased problem solving, decreased safety awareness, decreased memory, and delayed processing, central origin, and decreased sitting balance, decreased standing balance, decreased postural control, hemiplegia, and decreased balance strategies.  Prior to hospitalization and previous CVA in 05/2022, patient could complete BADLs with independent .  Patient will benefit from skilled intervention to decrease level of assist with basic self-care skills, increase independence with basic self-care skills, and increase level of independence with iADL prior to discharge home with care partner.  Anticipate patient will require 24 hour supervision and follow up Ot services TBD based on Pt discharge location and support/transportation availability.  OT - End of Session Activity Tolerance: Decreased this session Endurance Deficit: Yes Endurance Deficit Description: seated rest breaks throughout functional tasks OT Assessment Rehab Potential (ACUTE ONLY): Good OT Patient demonstrates impairments in the following area(s): Balance;Perception;Safety;Cognition;Endurance;Motor;Vision;Skin Integrity;Pain OT Basic ADL's Functional Problem(s):  Grooming;Bathing;Dressing;Toileting OT Advanced ADL's Functional Problem(s): Laundry OT Transfers Functional Problem(s): Toilet;Tub/Shower OT Additional Impairment(s): Fuctional Use of Upper Extremity OT Plan OT Intensity: Minimum of 1-2 x/day, 45 to 90 minutes OT Frequency: 5 out of 7 days OT Duration/Estimated Length of Stay: 2.5-3 weeks OT Treatment/Interventions: Balance/vestibular training;Discharge planning;Self Care/advanced ADL retraining;Therapeutic Activities;UE/LE Coordination activities;Pain management;Cognitive remediation/compensation;Functional mobility training;Patient/family education;Therapeutic Exercise;Visual/perceptual remediation/compensation;Community reintegration;DME/adaptive equipment instruction;Neuromuscular re-education;Psychosocial support;UE/LE Strength taining/ROM;Functional electrical stimulation;Skin care/wound managment OT Self Feeding Anticipated Outcome(s): Independent OT Basic Self-Care Anticipated Outcome(s): CGA OT Toileting Anticipated Outcome(s): CGA OT Bathroom Transfers Anticipated Outcome(s): CGA OT Recommendation Recommendations for Other Services: Therapeutic Recreation consult Therapeutic Recreation Interventions: Pet therapy Patient destination: Home Follow Up Recommendations: Home health OT;Outpatient OT (need for follow-up services TBD based on Pt discharge location and available support/transportation) Equipment Recommended: To be determined   OT Evaluation OT Therapeutic Updates/Progress:  1:1 OT evaluation and intervention initiated with skilled education provided on OT role, goals, and POC. Pt presenting to being good spirits and receptive to skilled OT session. Pt presenting with expressive aphasia impacting ability to assess functional cognition- will continue to evaluated during Pt hospital stay. Pt educated on aphasia and impact on speech. Pt informed OT current speech is intelligible and OT may ask her to utilize gestures, symbols, or  repeat statements when need with Pt nodding in understanding. Pt completed BADLs during session at levels listed below. Pt sit<>stand using RW mod A with mod cues for safety. Pt presenting with decreased step length and slow gait with increased time required for motor planning. Pt able to complete shower on chair min A UB and mod LB. Pt completed dressing in wc UB min and LB heavy mod-max A. Pt brushed teeth and groomed her hair sitting at sink with min A. Pt presenting to be motivated and supported  by family- husband, son, and DTR. Pt would benefit form further OT services to increase independence and safety in BADLs. DTR not present during session, but left message to call following eval. OT provided DTR with progress updates.    Precautions/Restrictions  Precautions Precautions: Fall;Other (comment) Precaution Comments: expressive aphasia Restrictions Weight Bearing Restrictions: No General Chart Reviewed: Yes Family/Caregiver Present: No Vital Signs Therapy Vitals Temp: 97.7 F (36.5 C) Temp Source: Oral Pulse Rate: (Abnormal) 49 Resp: 14 BP: (Abnormal) 133/53 Patient Position (if appropriate): Sitting Oxygen Therapy SpO2: 95 % O2 Device: Room Air Pain   Home Living/Prior Functioning Home Living Living Arrangements: Spouse/significant other Available Help at Discharge: Family, Available 24 hours/day Type of Home: House Home Access: Stairs to enter Secretary/administrator of Steps: 3 Entrance Stairs-Rails: None Home Layout: One level, Laundry or work area in basement [employer] Shower/Tub: Engineer, manufacturing systems: Handicapped height Bathroom Accessibility: Yes Additional Comments: live wtih husband, son is 2 miles away  Lives With: Spouse, Family IADL History Homemaking Responsibilities: Yes Meal Prep Responsibility: Secondary Laundry Responsibility: Secondary Cleaning Responsibility: Secondary Bill Paying/Finance Responsibility: Secondary Shopping Responsibility:  Secondary Child Care Responsibility: No Current License: Yes Mode of Transportation: Car Occupation: Retired Leisure and Hobbies: Attends church, 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, Volunteers at [employer], enjoys reading Prior Function Level of Independence: Independent with gait, Independent with transfers, Independent with homemaking with ambulation (prior to CVA 05/2022)  Able to Take Stairs?:  (uses stair lift to go up and down stairs in home) Driving: Yes Vocation: Volunteer work Leisure: Hobbies-yes (Comment) (enjoys reading) Vision Baseline Vision/History: 1 Wears glasses Ability to See in Adequate Light: 1 Impaired Patient Visual Report: No change from baseline Vision Assessment?: Yes Eye Alignment: Impaired (comment) Ocular Range of Motion: Within Functional Limits Alignment/Gaze Preference: Within Defined Limits Tracking/Visual Pursuits: Requires cues, head turns, or add eye shifts to track Saccades: Additional eye shifts occurred during testing;Additional head turns occurred during testing Convergence: Within functional limits (Pt reported no double vision; will continue to assess d/t Pt aphasia) Visual Fields: No apparent deficits Depth Perception: Undershoots Additional Comments: Will continue to assess vision in functional contexts; difficult to assess d/t Pt aphasia Perception  Perception: Within Functional Limits Praxis Praxis: Impaired Praxis Impairment Details: Ideomotor Cognition Cognition Overall Cognitive Status: Difficult to assess Arousal/Alertness: Awake/alert Orientation Level: Person;Place;Situation Person: Oriented Place: Oriented Situation: Oriented (appears to be oriented, difficult to assess) Memory: Impaired Memory Impairment: Decreased recall of new information (difficult to assess d/t Pt aphasia; will continue to evaluate during Pt stay) Attention: Focused;Sustained Focused Attention: Appears intact Sustained Attention: Appears intact Awareness:  Impaired (difficult to assess d/t Pt aphasia; will continue to evaluate during Pt stay) Awareness Impairment: Anticipatory impairment Executive Function: Self Correcting Self Monitoring: Impaired Self Monitoring Impairment: Functional basic Self Correcting: Impaired Self Correcting Impairment: Functional basic Safety/Judgment: Impaired Brief Interview for Mental Status (BIMS) Repetition of Three Words (First Attempt): 1 Temporal Orientation: Year: Missed by 1 year Temporal Orientation: Month: Nonsensical (pt with expressive aphasia; difficult to assess) Temporal Orientation: Day: Incorrect Recall: "Sock": Nonsensical (expressive aphasia) Recall: "Blue": Nonsensical (expressive aphasia) Recall: "Bed": Nonsensical (pt wiht expressive aphasia) BIMS Summary Score: 99 Sensation Sensation Light Touch: Appears Intact Hot/Cold: Not tested Proprioception: Appears Intact Stereognosis: Not tested Coordination Gross Motor Movements are Fluid and Coordinated: No Fine Motor Movements are Fluid and Coordinated: No Coordination and Movement Description: decreased motor planning/apraxia in RU/RLEs Finger Nose Finger Test: undershooting noted with RUE Motor  Motor Motor: Motor apraxia Motor - Skilled Clinical  Observations: delayed initiation  Trunk/Postural Assessment  Cervical Assessment Cervical Assessment: Exceptions to [facility] (slight forward head) Thoracic Assessment Thoracic Assessment: Exceptions to [facility] Lumbar Assessment Lumbar Assessment: Exceptions to [facility] Postural Control Postural Control: Deficits on evaluation Righting Reactions: Delayed Postural Limitations: delayed  Balance Balance Balance Assessed: Yes Static Sitting Balance Static Sitting - Balance Support: Feet supported Static Sitting - Level of Assistance: 5: Stand by assistance Dynamic Sitting Balance Dynamic Sitting - Balance Support: Feet supported Dynamic Sitting - Level of Assistance: 5: Stand by  assistance Dynamic Sitting - Balance Activities: Forward lean/weight shifting;Reaching for objects Static Standing Balance Static Standing - Balance Support: During functional activity Static Standing - Level of Assistance: 5: Stand by assistance Dynamic Standing Balance Dynamic Standing - Balance Support: During functional activity;Bilateral upper extremity supported Dynamic Standing - Level of Assistance: 4: Min assist;3: Mod assist Extremity/Trunk Assessment RUE Assessment RUE Assessment: Exceptions to [facility] Passive Range of Motion (PROM) Comments: WFL Active Range of Motion (AROM) Comments: Slightly limited shoudler flexion General Strength Comments: 4/5 overall, grip 3+/5 LUE Assessment LUE Assessment: Within Functional Limits General Strength Comments: 4+/5  Care Tool Care Tool Self Care Eating   Eating Assist Level: Minimal Assistance - Patient > 75%    Oral Care    Oral Care Assist Level: Minimal Assistance - Patient > 75%    Bathing   Body parts bathed by patient: Right arm;Left arm;Chest;Abdomen;Front perineal area;Right lower leg;Right upper leg;Face     Assist Level: Moderate Assistance - Patient 50 - 74%    Upper Body Dressing(including orthotics)   What is the patient wearing?: Pull over shirt   Assist Level: Minimal Assistance - Patient > 75%    Lower Body Dressing (excluding footwear)   What is the patient wearing?: Pants Assist for lower body dressing: Maximal Assistance - Patient 25 - 49%    Putting on/Taking off footwear   What is the patient wearing?: Socks;Shoes Assist for footwear: Maximal Assistance - Patient 25 - 49%       Care Tool Toileting Toileting activity   Assist for toileting: Maximal Assistance - Patient 25 - 49%     Care Tool Bed Mobility Roll left and right activity   Roll left and right assist level: Minimal Assistance - Patient > 75%    Sit to lying activity   Sit to lying assist level: Moderate Assistance - Patient 50 - 74%     Lying to sitting on side of bed activity   Lying to sitting on side of bed assist level: the ability to move from lying on the back to sitting on the side of the bed with no back support.: Moderate Assistance - Patient 50 - 74%     Care Tool Transfers Sit to stand transfer   Sit to stand assist level: Moderate Assistance - Patient 50 - 74%    Chair/bed transfer   Chair/bed transfer assist level: Moderate Assistance - Patient 50 - 74% Chair/bed transfer assistive device: Armrests;Walker   Toilet transfer   Assist Level: Moderate Assistance - Patient 50 - 74%     Care Tool Cognition  Expression of Ideas and Wants Expression of Ideas and Wants: 2. Frequent difficulty - frequently exhibits difficulty with expressing needs and ideas  Understanding Verbal and Non-Verbal Content Understanding Verbal and Non-Verbal Content: 3. Usually understands - understands most conversations, but misses some part/intent of message. Requires cues at times to understand   Memory/Recall Ability Memory/Recall Ability : Current season;Staff names and faces;That he or  she is in a hospital/hospital unit;Location of own room   Refer to Care Plan for Long Term Goals  SHORT TERM GOAL WEEK 1 OT Short Term Goal 1 (Week 1): Pt will complete toilet transfer using LRAD min A OT Short Term Goal 2 (Week 1): Pt will complete LB dressing min A OT Short Term Goal 3 (Week 1): Pt will utilize RUE during functional tasks ~75% with supervision OT Short Term Goal 4 (Week 1): Pt will complete sit>stand for BADLs min A using LRAD  Recommendations for other services: Therapeutic Recreation  Pet therapy   Skilled Therapeutic Intervention ADL ADL Eating: Minimal assistance Where Assessed-Eating: Edge of bed Grooming: Minimal assistance Where Assessed-Grooming: Wheelchair Upper Body Bathing: Minimal assistance Where Assessed-Upper Body Bathing: Shower Lower Body Bathing: Moderate assistance Where Assessed-Lower Body  Bathing: Shower Upper Body Dressing: Minimal assistance Where Assessed-Upper Body Dressing: Wheelchair Lower Body Dressing: Moderate assistance Where Assessed-Lower Body Dressing: Wheelchair Toileting: Moderate assistance Where Assessed-Toileting: Glass blower/designer: Moderate assistance Toilet Transfer Method: Counselling psychologist: Grab bars;Raised toilet seat Tub/Shower Transfer: Not assessed Social research officer, government: Moderate assistance Social research officer, government Method: Heritage manager: Shower seat with back Mobility  Bed Mobility Bed Mobility: Rolling Right;Rolling Left;Sit to Supine;Supine to Sit Rolling Right: Minimal Assistance - Patient > 75% Rolling Left: Minimal Assistance - Patient > 75% Supine to Sit: Moderate Assistance - Patient 50-74% Sit to Supine: Moderate Assistance - Patient 50-74% Transfers Sit to Stand: Moderate Assistance - Patient 50-74% Stand to Sit: Moderate Assistance - Patient 50-74%   Discharge Criteria: Patient will be discharged from OT if patient refuses treatment 3 consecutive times without medical reason, if treatment goals not met, if there is a change in medical status, if patient makes no progress towards goals or if patient is discharged from hospital.  The above assessment, treatment plan, treatment alternatives and goals were discussed and mutually agreed upon: by patient  Janey Genta 06/12/2022, 4:54 PM

## 2022-06-12 NOTE — Progress Notes (Signed)
Inpatient [place] Individual Statement of Services  Patient Name:  Rebekah Pope  Date:  06/12/2022  Welcome to the [facility].  Our goal is to provide you with an individualized program based on your diagnosis and situation, designed to meet your specific needs.  With this comprehensive rehabilitation program, you will be expected to participate in at least 3 hours of rehabilitation therapies Monday-Friday, with modified therapy programming on the weekends.  Your rehabilitation program will include the following services:  Physical Therapy (PT), Occupational Therapy (OT), Speech Therapy (ST), 24 hour per day rehabilitation nursing, Therapeutic Recreaction (TR), Neuropsychology, Care Coordinator, Rehabilitation Medicine, Nutrition Services, Pharmacy Services, and Other  Weekly team conferences will be held on Wednesdays to discuss your progress.  Your Inpatient Rehabilitation Care Coordinator will talk with you frequently to get your input and to update you on team discussions.  Team conferences with you and your family in attendance may also be held.  Expected length of stay: 14-16 Days  Overall anticipated outcome:  Supervision/Min A  Depending on your progress and recovery, your program may change. Your Inpatient Rehabilitation Care Coordinator will coordinate services and will keep you informed of any changes. Your Inpatient Rehabilitation Care Coordinator's name and contact numbers are listed  below.  The following services may also be recommended but are not provided by the [facility]:   [place] will be made to provide these services after discharge if needed.  Arrangements include referral to agencies that provide these services.  Your insurance has been verified to be:   [employer] Your primary doctor is:  Marcie Mowers, NP  Pertinent  information will be shared with your doctor and your insurance company.  Inpatient Rehabilitation Care Coordinator:  Erlene Quan, [place] or [phone]  Information discussed with and copy given to patient by: [name], 06/12/2022, 10:27 AM

## 2022-06-12 NOTE — IPOC Note (Addendum)
Overall Plan of Care [facility]) Patient Details Name: Rebekah Pope MRN: [id number] DOB: [birth year]  Admitting Diagnosis: Left middle cerebral artery stroke [facility])  Hospital Problems: Principal Problem:   Left middle cerebral artery stroke [facility])     Functional Problem List: Nursing Bladder, Bowel, Safety, Endurance, Medication Management, Skin Integrity  PT Balance, Behavior, Endurance, Motor  OT Balance, Perception, Safety, Cognition, Endurance, Motor, Vision, Skin Integrity, Pain  SLP Linguistic, Motor  TR         Basic ADL's: OT Grooming, Bathing, Dressing, Toileting     Advanced  ADL's: OT Laundry     Transfers: PT Bed Mobility, Bed to Chair, Set designer, Occupational psychologist, Research scientist (life sciences): PT Ambulation, Psychologist, prison and probation services, Stairs     Additional Impairments: OT Fuctional Use of Upper Extremity  SLP Communication comprehension, expression    TR      Anticipated Outcomes Item Anticipated Outcome  Self Feeding Independent  Swallowing  NA   Basic self-care  CGA  Toileting  CGA   Bathroom Transfers CGA  Bowel/Bladder  manage bowel w mod I and bladder w toileting  Transfers  CGA with LRAD  Locomotion  CGA with LRAD  Communication  sup simple comprehension, min A expression through multimodal means  Cognition  NA  Pain  n/a  Safety/Judgment  manage w cues   Therapy Plan: PT Intensity: Minimum of 1-2 x/day ,45 to 90 minutes PT Frequency: 5 out of 7 days PT Duration Estimated Length of Stay: 14-18 days OT Intensity: Minimum of 1-2 x/day, 45 to 90 minutes OT Frequency: 5 out of 7 days OT Duration/Estimated Length of Stay: 2.5-3 weeks SLP Intensity: Minumum of 1-2 x/day, 30 to 90 minutes SLP Frequency: 3 to 5 out of 7 days SLP Duration/Estimated Length of Stay: 2-2.5 weeks   Team Interventions: Nursing Interventions Bladder Management, Medication Management, Discharge Planning, Patient/Family Education, Bowel Management, Disease  Management/Prevention  PT interventions Ambulation/gait training, Community reintegration, DME/adaptive equipment instruction, Neuromuscular re-education, Psychosocial support, Stair training, UE/LE Strength taining/ROM, Warden/ranger, Discharge planning, Functional electrical stimulation, Pain management, Skin care/wound management, Therapeutic Activities, UE/LE Coordination activities, Cognitive remediation/compensation, Disease management/prevention, Functional mobility training, Splinting/orthotics, Patient/family education, Therapeutic Exercise, Visual/perceptual remediation/compensation, Wheelchair propulsion/positioning  OT Interventions Warden/ranger, Discharge planning, Self Care/advanced ADL retraining, Therapeutic Activities, UE/LE Coordination activities, Pain management, Cognitive remediation/compensation, Functional mobility training, Patient/family education, Therapeutic Exercise, Visual/perceptual remediation/compensation, Community reintegration, Fish farm manager, Neuromuscular re-education, Psychosocial support, UE/LE Strength taining/ROM, Functional electrical stimulation, Skin care/wound managment  SLP Interventions Multimodal communication approach, Speech/Language facilitation, Cueing hierarchy, Functional tasks, Patient/family education, Therapeutic Activities  TR Interventions    SW/CM Interventions Discharge Planning, Psychosocial Support, Patient/Family Education, Disease Management/Prevention   Barriers to Discharge MD  Medical stability  Nursing Decreased caregiver support, Home environment access/layout 1 level 3 ste no railing; stair lift to basement laundry w spouse  PT Incontinence, Decreased caregiver support    OT      SLP      SW Lack of/limited family support, Decreased caregiver support, Community education officer for SNF coverage, Home environment Psychiatric nurse Discharge Planning: Destination: PT-Home ,OT- Home ,  SLP-Home Projected Follow-up: PT-Home health PT, Skilled nursing facility, OT-  Home health OT, Outpatient OT (need for follow-up services TBD based on Pt discharge location and available support/transportation), SLP-24 hour supervision/assistance, Home Health SLP Projected Equipment Needs: PT-Wheelchair cushion (measurements), Wheelchair (measurements), Rolling walker with 5" wheels, OT- To be determined, SLP-None recommended by SLP Equipment Details: PT- ,  OT-  Patient/family involved in discharge planning: PT- Patient,  OT-Patient, SLP-Patient, Family member/caregiver  MD ELOS: 14-16d Medical Rehab Prognosis:  Fair Assessment: The patient has been admitted for CIR therapies with the diagnosis of Left MCA infarct. The team will be addressing functional mobility, strength, stamina, balance, safety, adaptive techniques and equipment, self-care, bowel and bladder mgt, patient and caregiver education, PAF rate control, CHF. Goals have been set at min/sup . Anticipated discharge destination is home vs ALF.        See Team Conference Notes for weekly updates to the plan of care

## 2022-06-12 NOTE — Progress Notes (Signed)
PROGRESS NOTE   Subjective/Complaints:  Aphasic  ROS- limited by aphasia   Objective:   No results found. Recent Labs    06/12/22 0630  WBC 5.6  HGB 11.0*  HCT 32.0*  PLT 240   Recent Labs    06/12/22 0630  NA 136  K 4.2  CL 104  CO2 27  GLUCOSE 108*  BUN 19  CREATININE 1.06*  CALCIUM 8.6*    Intake/Output Summary (Last 24 hours) at 06/12/2022 0741 Last data filed at 06/11/2022 2213 Gross per 24 hour  Intake 296 ml  Output --  Net 296 ml        Physical Exam: Vital Signs Blood pressure (!) 141/51, pulse (!) 49, temperature 98.6 F (37 C), resp. rate 17, height 5\' 3"  (1.6 m), weight 83.2 kg, SpO2 96 %.   General: No acute distress Mood and affect are appropriate Heart: Regular rate and rhythm no rubs murmurs or extra sounds Lungs: Clear to auscultation, breathing unlabored, no rales or wheezes Abdomen: Positive bowel sounds, soft nontender to palpation, nondistended Extremities: No clubbing, cyanosis, or edema Skin: No evidence of breakdown, no evidence of rash Neurologic: alert , follows simple commands, fluent aphasia Cranial nerves II through XII intact, motor strength is 5/5 in left 4/5 right  deltoid, bicep, tricep, grip, hip flexor, knee extensors, ankle dorsiflexor and plantar flexor Sensory exam cannot assess aphasic  Cerebellar exam normal finger to nose to finger ain UE Musculoskeletal: Full range of motion in all 4 extremities. No joint swelling   Assessment/Plan: 1. Functional deficits which require 3+ hours per day of interdisciplinary therapy in a comprehensive inpatient rehab setting. Physiatrist is providing close team supervision and 24 hour management of active medical problems listed below. Physiatrist and rehab team continue to assess barriers to discharge/monitor patient progress toward functional and medical goals  Care Tool:  Bathing              Bathing assist        Upper Body Dressing/Undressing Upper body dressing        Upper body assist      Lower Body Dressing/Undressing Lower body dressing            Lower body assist       Toileting Toileting    Toileting assist       Transfers Chair/bed transfer  Transfers assist           Locomotion Ambulation   Ambulation assist              Walk 10 feet activity   Assist           Walk 50 feet activity   Assist           Walk 150 feet activity   Assist           Walk 10 feet on uneven surface  activity   Assist           Wheelchair     Assist               Wheelchair 50 feet with 2 turns activity    Assist  Wheelchair 150 feet activity     Assist          Blood pressure (!) 141/51, pulse (!) 49, temperature 98.6 F (37 C), resp. rate 17, height 5\' 3"  (1.6 m), weight 83.2 kg, SpO2 96 %.  Medical Problem List and Plan: 1. Functional deficits secondary to bilateral left greater than right acute scattered punctate infarct due to left ICA and MCA occlusion status post revascularization as well as history of ischemic cerebrovascular accident December 5366- embolic stroke DOAC failure              -patient may  shower             -ELOS/Goals: 14-16 days, min assist to sup goals with PT, OT, min assist with SLP 2.  Antithrombotics: -DVT/anticoagulation: Eliquis             -antiplatelet therapy: N/A 3. Pain Management: Tylenol as needed 4. Mood/Behavior/Sleep: Provide emotional support             -antipsychotic agents: N/A 5. Neuropsych/cognition: This patient is not capable of making decisions on her own behalf. 6. Skin/Wound Care: Routine skin checks 7. Fluids/Electrolytes/Nutrition: Routine in and outs with follow-up chemistries 8.  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  Follow-up outpatient cardiology.  Continue amiodarone 100 mg daily 9.  CKD.  Baseline creatinine 1.20-1.30.   Follow-up chemistries on admit. 10.  Diastolic congestive heart failure.  Monitor for any signs of fluid overload.  Continue Entresto 24-26 mg twice daily             -daily weights 11.  Hypothyroidism.  Synthroid 12.  Hyperlipidemia.  Lipitor 13.  Constipation.  MiraLAX daily, Colace 100 mg twice daily    LOS: 1 days A FACE TO FACE EVALUATION WAS PERFORMED  Charlett Blake 06/12/2022, 7:41 AM

## 2022-06-12 NOTE — Progress Notes (Signed)
06/12/22 1545  Spiritual Encounters  Type of Visit Initial  Care provided to: Pt and family  Referral source Physician;Nurse (RN/NT/LPN) Charlett Blake, MD; Wadie Lessen. Luanna Cole, RN)  Reason for visit Advance directives  OnCall Visit No  Advance Directives (For Healthcare)  Does Patient Have a Medical Advance Directive? No  Would patient like information on creating a medical advance directive? Yes (Inpatient - patient defers creating a medical advance directive at this time - Information given)  [place] Directives  Does Patient Have a Mental Health Advance Directive? No   Chaplain responded to Spiritual Care Consultation requesting assistance for Advance Directive preparation with [name] Plan. Chaplain met with [name] and her HUSBAND: Rebekah Pope, and SON at patient's bedside on 06/12/2022 at 1545.   [name] stated that she does NOT have a court appointed [place]. [name] stated that she IS Married.   [name] indicated that she intends to list her HUSBAND: Rebekah Pope,  [phone] as her [place].   Chaplain provided the Advance Directive packet as well as education on Advance Directives-documents an individual completes to communicate their health care directions in advance of a time when they may need them. Chaplain informed [name] the documents which may be completed here in the hospital are the Living Will and [place].  Chaplain informed [name] that the [place] is a legal document in which an individual names another person, as their [place], to communicate her health care decisions, when she is not able to communicate them for herself. The Health Care Agent's function can be temporary or permanent depending on her ability to make and communicate those decisions  independently.   Chaplain informed [name] in the absence of a [place], the state of [place] directs health care providers to look to the following individuals in the order listed: legal guardian; an attorney-in-fact under a general power of attorney (POA) if that POA includes the right to make health care decisions; person's spouse; a 57 of her adult children; a 80 of adult brothers and sisters; or an individual who has an established relationship with you, who is acting in good faith and who can convey your wishes.  If none of these persons are available or willing to make medical decisions on a patient's behalf, the law allows the patient's doctor to make decisions for them as long as another doctor agrees with those decisions.  Chaplain also informed the patient that the Health Care agent has no decision-making authority over any affairs other than those related to her medical care.  The chaplain further educated [name] that a Living Will is a legal document that allows her desires not to receive life-prolonging measures in the event that she has a condition that is incurable and will result in her death in a short period of time; they are unconscious, and doctors are confident that she will not regain consciousness; and/or she has advanced dementia or other substantial and irreversible loss of mental function.   The chaplain informed [name] that life-prolonging measures are medical treatments that would only serve to postpone death, including breathing machines, kidney dialysis, antibiotics, artificial nutrition and hydration (tube feeding), and similar forms of treatment and that if an individual is able to express their wishes, they may also make them known without  the use of a Living Will, but in the event that she is not able to express her wishes, a Living Will allows medical providers and the her family and friends to ensure that they  are not making decisions on her behalf, but rather serving as her voice to convey decisions that she has already made.  [name] is aware that the decision to create an advance directive is hers alone and she may choose not to complete the documents or may choose to complete one portion or both.  [name] was informed that she can revoke the documents at any time by striking through them and writing void or by completing new documents, but that it is also advisable that the individual verbally notify interested parties that her wishes have changed.  [name] is also aware that the document must be signed in the presence of a notary public and two witnesses and that this may be done while the patient is still admitted to the hospital or after discharge in the community. If they decide to complete Advance Directives after being discharged from the hospital, they have been advised to notify all interested parties and to provide those documents to their physicians and loved ones in addition to bringing them to the hospital in the event of another hospitalization.  The chaplain informed the [name] that if she desires to proceed with completing the Advance Directive Documentation while she is still admitted, notary services are typically available at [facility] between the hours of 1:00 and 3:30 Monday-Thursday.  When the patient is ready to have these documents completed, the patient should request that their nurse place a spiritual care consult and indicate that the patient is ready to have their advance directives notarized so that arrangements for witnesses and notary public can be made.  If there is an immediate need to have notarized please page the Chaplain.   Please page spiritual care if the patient desires further education or has questions.  [address] [place], [place] [phone].

## 2022-06-12 NOTE — Evaluation (Incomplete)
Speech Language Pathology Assessment and Plan  Patient Details  Name: Rebekah Pope MRN: [id number] Date of Birth: 1940-09-08  SLP Diagnosis: Aphasia;Dysarthria;Speech and Language deficits  Rehab Potential: Good ELOS: 2-2.5 weeks   Today's Date: 06/12/2022 SLP Individual Time: 1300-1400   Hospital Problem: Principal Problem:   Left middle cerebral artery stroke [facility])  Past Medical History:  Past Medical History:  Diagnosis Date   Allergic rhinitis    CAD (coronary artery disease)    Chronic diastolic CHF (congestive heart failure) (HCC)    Chronic kidney disease, stage 3b (HCC)    Complex regional pain syndrome i of unspecified lower limb    Dizziness and giddiness    Essential (primary) hypertension    History of hiatal hernia    Hyperlipidemia    Hypothyroidism    LBBB (left bundle branch block)    Localized edema    Paroxysmal atrial fibrillation (HCC)    PONV (postoperative nausea and vomiting)    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 Burt Knack & Dr. Cyndia Bent   Severe aortic stenosis    Sinus bradycardia    Stroke (cerebrum) [facility])    Past Surgical History:  Past Surgical History:  Procedure Laterality Date   CESAREAN SECTION     INTRAOPERATIVE TRANSTHORACIC ECHOCARDIOGRAM N/A 02/10/2022   Procedure: INTRAOPERATIVE TRANSTHORACIC ECHOCARDIOGRAM;  Surgeon: Sherren Mocha, MD;  Location: [place] [facility];  Service: Open Heart Surgery;  Laterality: N/A;   IR ANGIO VERTEBRAL SEL VERTEBRAL UNI L MOD SED  06/04/2022   IR CT HEAD LTD  06/04/2022   IR PERCUTANEOUS ART THROMBECTOMY/INFUSION INTRACRANIAL INC DIAG ANGIO  06/04/2022   IR US GUIDE VASC ACCESS RIGHT  06/04/2022   RADIOLOGY WITH ANESTHESIA N/A 06/04/2022   Procedure: IR WITH ANESTHESIA;  Surgeon: Radiologist, Medication, MD;  Location: [place];  Service: Radiology;  Laterality: N/A;   RIGHT HEART CATH AND CORONARY ANGIOGRAPHY N/A  10/31/2021   Procedure: RIGHT HEART CATH AND CORONARY ANGIOGRAPHY;  Surgeon: Burnell Blanks, MD;  Location: [place] [facility];  Service: Cardiovascular;  Laterality: N/A;   TRANSCATHETER AORTIC VALVE REPLACEMENT, TRANSFEMORAL N/A 02/10/2022   Procedure: Transcatheter Aortic Valve Replacement, Transfemoral;  Surgeon: Sherren Mocha, MD;  Location: [place] [facility];  Service: Open Heart Surgery;  Laterality: N/A;    Assessment / Plan / Recommendation Clinical Impression  Rebekah Pope is a 82 year old right-handed female with history of ischemic cerebrovascular accident receiving inpatient rehab services 05/12/2022 - 05/21/2022 and underwent IR thrombectomy with a Zio patch placed as well as history of sick sinus syndrome, paroxysmal atrial fibrillation on anticoagulation with DOAC as well as amiodarone, hypertension, CKD with creatinine baseline 1.20-1.30, hyperlipidemia, obstructive CAD, diastolic congestive heart failure, 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  Per chart review lives with spouse.  1 level home one-step to entry.  Ambulatory with rolling walker.  Presented 06/04/2022 with altered mental status, leftward gaze and right-sided weakness/aphasia.  Cranial CT scan showed no definite evidence of involved left MCA infarction on motion degraded initial contrast head CT.  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  Occluded bilateral PCA shortly after their origins with diminutive reconstitution of flow in distal vessels favored to be chronic.  Focal high-grade stenosis of the left A3 segment.  Patient underwent revascularization per interventional radiology.  Echocardiogram with ejection fraction of 60 to 65% no wall motion abnormalities.  Admission chemistries unremarkable except glucose 108, urine drug screen negative,  urinalysis negative nitrite.  Neurology follow-up patient remains on Eliquis as prior to admission.  Zio patch with no A-fib detected.  Tolerating a regular diet.  Therapy evaluations completed due to patient decreased functional ability right-sided weakness and aphasia was admitted for a comprehensive rehab program.   Pt is known to this service. She was evaluated by SLP on 05/14/22 following admission concerning "tiny acute to subacute infarcts suggesting central embolic disease." Pt's cognitive-linguistic function was felt to be at baseline and patient was not picked up for SLP services at the time. Pt presents today with a moderate-to-severe expressive (fluent) aphasia, and mild-to-moderate receptive aphasia in setting of recent left MCA stroke. Pt was able to comprehend simple biographical y/n questions with >90% accuracy; environmental questions with 60% accuracy. Pt had difficulty following 1-step commands with 28% accuracy and 0% with two step. Suspect possible motor planning deficits impacted ability to execute commands. Pt excelled with object identification. Able to ID objects by name in field of 2 and 3 with 95% accuracy; ID'd objects by feature in field of 3 with 100% accuracy. Pt presented with significant neologistic errors, along with semantic and phonemic paraphasias. Pt was stimulable to phonemic cues and sentence completion cues to overcome word finding difficulty during structured naming tasks. Pt was only able to name 4/20 objects independently. Pt approximated initial consonant with many of the items. Pt's communication is essentially incomprehensible when she attempts to communicate at the phrase and sentence level. She is able to answer some one-word open ended responses and relatively consistent with verbal yes/no responses. Pt presented with intact reading comprehension at the word and phrase level. She was able to use a no-tech , picture based communication board with mod fading to min A  verbal cues. Pt's speech may be suggestive of mild dysarthria although difficult to decipher due to high frequency of language errors.   SLP administered the Western Aphasia Battery Bedside (WAB-B) with the following results: Spontaneous Speech Content: 0/10 Spontaneous Speech Fluency: 2/10 Auditory Verbal Comprehension: 5/10 Following Sequential Commands: 0/10 Repetition: 3/10 Naming: 2/10 Writing: Not assessed due to time constraints.   Given the abovementioned deficits, pt would benefit from skilled ST in order to maximize functional independence and reduce burden of care prior to discharge. Anticipate that pt will need 24/7 supervision at discharge in addition to [place] follow up at next level of care.     Skilled Therapeutic Interventions          Formal and informal speech and language assessment measures administered. Please see report for full details. SLP provided education to pt's son, spouse, and daughter (via phone) regarding aphasia and communication strategies. Left family with several handouts to support verbal education.  SLP Assessment  Patient will need skilled [place] [facility] during CIR admission    Recommendations  Oral Care Recommendations: Oral care BID Patient destination: Home Follow up Recommendations: 24 hour supervision/assistance;Home Health SLP Equipment Recommended: None recommended by SLP    SLP Frequency 3 to 5 out of 7 days   SLP Duration  SLP Intensity  SLP Treatment/Interventions 2-2.5 weeks  Minumum of 1-2 x/day, 30 to 90 minutes  Multimodal communication approach;Speech/Language facilitation;Cueing hierarchy;Functional tasks;Patient/family education;Therapeutic Activities    Pain  None  Prior Functioning Cognitive/Linguistic Baseline: Within functional limits (Was evaluated during prior admission on  05/14/22 and cognitive-linguistic skills were found to be at baseline) Type of Home: House  Lives With:  Spouse;Family Available Help at Discharge: Family;Available 24 hours/day 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 Overall Cognitive Status: Difficult to assess (difficult to assess 2/2 significant language deficits) Arousal/Alertness: Awake/alert Orientation Level: Oriented to person;Oriented to situation Year:  (did not assess) Month:  (did not assess) Day of Week:  (did not assess) Attention: Focused;Sustained;Selective Focused Attention: Appears intact Sustained Attention: Appears intact Selective Attention: Appears intact Memory:  (unable to assess)  Comprehension Auditory Comprehension Overall Auditory Comprehension: Impaired Yes/No Questions: Impaired Basic Biographical Questions: 76-100% accurate Basic Immediate Environment Questions: 50-74% accurate Complex Questions: 0-24% accurate Commands: Impaired One Step Basic Commands: Other (comment) (2/7 (28%)) Two Step Basic Commands: Other (comment) (0%) EffectiveTechniques: Extra processing time;Repetition;Visual/Gestural cues Reading Comprehension Reading Status:  (intact at the word and phrase level) Expression Expression Primary Mode of Expression: Verbal Verbal Expression Overall Verbal Expression: Impaired Automatic Speech:  (Did not assess due to time constraints) Level of Generative/Spontaneous Verbalization: Word (pt attempts sentence level but generally uncomprehensible) Repetition: Impaired Level of Impairment: Word level Naming: Impairment Responsive:  (0%) Confrontation: Impaired (4/20) Divergent: Not tested Verbal Errors: Phonemic paraphasias;Semantic paraphasias;Neologisms;Perseveration Written Expression Written Expression: Not tested Oral Motor Motor Speech Overall Motor Speech: Impaired Respiration: Within functional limits Phonation: Normal Resonance: Within functional limits Articulation: Impaired Intelligibility: Intelligibility reduced Word: 75-100% accurate  Care Tool Care  Tool Cognition Ability to hear (with hearing aid or hearing appliances if normally used Ability to hear (with hearing aid or hearing appliances if normally used): 0. Adequate - no difficulty in normal conservation, social interaction, listening to TV   Expression of Ideas and Wants Expression of Ideas and Wants: 2. Frequent difficulty - frequently exhibits difficulty with expressing needs and ideas   Understanding Verbal and Non-Verbal Content Understanding Verbal and Non-Verbal Content: 3. Usually understands - understands most conversations, but misses some part/intent of message. Requires cues at times to understand  Memory/Recall Ability Memory/Recall Ability : That he or she is in a hospital/hospital unit   PMSV Assessment  PMSV Trial Intelligibility: Intelligibility reduced Word: 75-100% accurate  Short Term Goals: Week 1: SLP Short Term Goal 1 (Week 1): Patient will name common objects with 50% accuracy given mod A verbal/visual cues SLP Short Term Goal 2 (Week 1): Patient will utilize multimodal communication means (e.g., picture based communication board) to communicate functional needs during 3/5 opportunities throughout session with mod A verbal/visual cues for effectiveness SLP Short Term Goal 3 (Week 1): Patient will complete automatic speech tasks with 50% accuracy given mod A verbal and visual cues SLP Short Term Goal 4 (Week 1): Patient will follow 1-step commands with max A multimodal cues to achieve 40% accuracy SLP Short Term Goal 5 (Week 1): Patient will respond to semi-complex yes/no questions with mod A verbal/visual cues to achieve 50% accuracy  Refer to Care Plan for Long Term Goals  Recommendations for other services: None   Discharge Criteria: Patient will be discharged from SLP if patient refuses treatment 3 consecutive times without medical reason, if treatment goals not met, if there is a change in medical status, if patient makes no progress towards goals or if  patient is discharged from hospital.  The above assessment, treatment plan, treatment alternatives and goals were discussed and mutually agreed upon: by patient and by family  Tamala Ser 06/13/2022, 8:34 AM

## 2022-06-12 NOTE — Evaluation (Signed)
Physical Therapy Assessment and Plan  Patient Details  Name: Hania Cerone MRN: [id number] Date of Birth: [birth year]  PT Diagnosis: Abnormal posture, Abnormality of gait, Coordination disorder, Hemiplegia dominant, and Muscle weakness Rehab Potential: Good ELOS: 14-18 days   Today's Date: 06/12/2022 PT Individual Time: [phone]      Hospital Problem: Principal Problem:   Left middle cerebral artery stroke [facility])   Past Medical History:  Past Medical History:  Diagnosis Date   Allergic rhinitis    CAD (coronary artery disease)    Chronic diastolic CHF (congestive heart failure) (HCC)    Chronic kidney disease, stage 3b (HCC)    Complex regional pain syndrome i of unspecified lower limb    Dizziness and giddiness    Essential (primary) hypertension    History of hiatal hernia    Hyperlipidemia    Hypothyroidism    LBBB (left bundle branch block)    Localized edema    Paroxysmal atrial fibrillation (HCC)    PONV (postoperative nausea and vomiting)    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 Excell Seltzer & Dr. Laneta Simmers   Severe aortic stenosis    Sinus bradycardia    Stroke (cerebrum) [facility])    Past Surgical History:  Past Surgical History:  Procedure Laterality Date   CESAREAN SECTION     INTRAOPERATIVE TRANSTHORACIC ECHOCARDIOGRAM N/A 02/10/2022   Procedure: INTRAOPERATIVE TRANSTHORACIC ECHOCARDIOGRAM;  Surgeon: Tonny Bollman, MD;  Location: [facility] INVASIVE CV LAB;  Service: Open Heart Surgery;  Laterality: N/A;   IR ANGIO VERTEBRAL SEL VERTEBRAL UNI L MOD SED  06/04/2022   IR CT HEAD LTD  06/04/2022   IR PERCUTANEOUS ART THROMBECTOMY/INFUSION INTRACRANIAL INC DIAG ANGIO  06/04/2022   IR US GUIDE VASC ACCESS RIGHT  06/04/2022   RADIOLOGY WITH ANESTHESIA N/A 06/04/2022   Procedure: IR WITH ANESTHESIA;  Surgeon: Radiologist, Medication, MD;  Location: [facility] OR;  Service: Radiology;  Laterality: N/A;    RIGHT HEART CATH AND CORONARY ANGIOGRAPHY N/A 10/31/2021   Procedure: RIGHT HEART CATH AND CORONARY ANGIOGRAPHY;  Surgeon: Kathleene Hazel, MD;  Location: [facility] INVASIVE CV LAB;  Service: Cardiovascular;  Laterality: N/A;   TRANSCATHETER AORTIC VALVE REPLACEMENT, TRANSFEMORAL N/A 02/10/2022   Procedure: Transcatheter Aortic Valve Replacement, Transfemoral;  Surgeon: Tonny Bollman, MD;  Location: [facility] INVASIVE CV LAB;  Service: Open Heart Surgery;  Laterality: N/A;    Assessment & Plan Clinical Impression: Patient is a 82 year old right-handed female with history of ischemic cerebrovascular accident receiving inpatient rehab services 05/12/2022 - 05/21/2022 and underwent IR thrombectomy with a Zio patch placed as well as history of sick sinus syndrome, paroxysmal atrial fibrillation on anticoagulation with DOAC as well as amiodarone, hypertension, CKD with creatinine baseline 1.20-1.30, hyperlipidemia, obstructive CAD, diastolic congestive heart failure, 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  Per chart review lives with spouse.  1 level home one-step to entry.  Ambulatory with rolling walker.  Presented 06/04/2022 with altered mental status, leftward gaze and right-sided weakness/aphasia.  Cranial CT scan showed no definite evidence of involved left MCA infarction on motion degraded initial contrast head CT.  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  Occluded bilateral PCA shortly after their origins with diminutive reconstitution of flow in distal vessels favored to be chronic.  Focal high-grade stenosis  of the left A3 segment.  Patient underwent revascularization per interventional radiology.  Echocardiogram with ejection fraction of 60 to 65% no wall motion abnormalities.  Admission chemistries unremarkable except glucose 108, urine drug  screen negative, urinalysis negative nitrite.  Neurology follow-up patient remains on Eliquis as prior to admission.  Zio patch with no A-fib detected.  Tolerating a regular diet.  Therapy evaluations completed due to patient decreased functional ability right-sided weakness and aphasia was admitted for a comprehensive rehab program.   Patient transferred to CIR on 06/11/2022 .   Patient currently requires mod with mobility secondary to muscle weakness and muscle joint tightness, decreased cardiorespiratoy endurance, unbalanced muscle activation, motor apraxia, and decreased coordination, and decreased sitting balance, decreased standing balance, decreased postural control, hemiplegia, and decreased balance strategies.  Prior to hospitalization, patient was supervision with mobility and lived with Spouse, Family in a House home.  Home access is 3Stairs to enter.  Patient will benefit from skilled PT intervention to maximize safe functional mobility, minimize fall risk, and decrease caregiver burden for planned discharge  SNF .  Anticipate patient will  benefit from SNF  at discharge.  PT - End of Session Activity Tolerance: Tolerates 10 - 20 min activity with multiple rests Endurance Deficit: Yes PT Assessment Rehab Potential (ACUTE/IP ONLY): Good PT Barriers to Discharge: Incontinence;Decreased caregiver support PT Patient demonstrates impairments in the following area(s): Balance;Behavior;Endurance;Motor PT Transfers Functional Problem(s): Bed Mobility;Bed to Chair;Car;Furniture PT Locomotion Functional Problem(s): Ambulation;Wheelchair Mobility;Stairs PT Plan PT Intensity: Minimum of 1-2 x/day ,45 to 90 minutes PT Frequency: 5 out of 7 days PT Duration Estimated Length of Stay: 14-18 days PT Treatment/Interventions: Ambulation/gait training;Community reintegration;DME/adaptive equipment instruction;Neuromuscular re-education;Psychosocial support;Stair training;UE/LE Strength  taining/ROM;Balance/vestibular training;Discharge planning;Functional electrical stimulation;Pain management;Skin care/wound management;Therapeutic Activities;UE/LE Coordination activities;Cognitive remediation/compensation;Disease management/prevention;Functional mobility training;Splinting/orthotics;Patient/family education;Therapeutic Exercise;Visual/perceptual remediation/compensation;Wheelchair propulsion/positioning PT Transfers Anticipated Outcome(s): CGA with LRAD PT Locomotion Anticipated Outcome(s): CGA with LRAD PT Recommendation Recommendations for Other Services: Therapeutic Recreation consult;Speech consult Follow Up Recommendations: Home health PT;Skilled nursing facility Patient destination: Home Equipment Recommended: Wheelchair cushion (measurements);Wheelchair (measurements);Rolling walker with 5" wheels   PT Evaluation Precautions/Restrictions Precautions Precautions: Fall;Other (comment) Restrictions Weight Bearing Restrictions: No General   Vital SignsTherapy Vitals Pulse Rate: (!) 46 BP: (!) 155/89 Patient Position (if appropriate): Orthostatic Vitals Oxygen Therapy O2 Device: Room Air Pain Pain Assessment Pain Scale: 0-10 Pain Score: 0-No pain Pain Interference Pain Interference Pain Effect on Sleep: 1. Rarely or not at all Pain Interference with Therapy Activities: 1. Rarely or not at all Pain Interference with Day-to-Day Activities: 1. Rarely or not at all Home Living/Prior [place] Available Help at Discharge: Family;Available 24 hours/day Type of Home: House Home Access: Stairs to enter [employer] of Steps: 3 Entrance Stairs-Rails: None Bathroom Shower/Tub: Tub/shower unit Additional Comments: live wtih husband, son is 2 miles away  Lives With: Spouse;Family Prior Function Level of Independence: Independent with gait;Independent with transfers;Independent with homemaking with ambulation (prior to CVA in  05/2022) Vision/Perception  Vision - History Ability to See in Adequate Light: 1 Impaired Vision - Assessment Ocular Range of Motion: Within Functional Limits Tracking/Visual Pursuits: Requires cues, head turns, or add eye shifts to track Saccades: Additional eye shifts occurred during testing;Additional head turns occurred during testing Convergence: Within functional limits Perception Perception: Within Functional Limits Praxis Praxis: Impaired Praxis Impairment Details: Ideomotor  Cognition Overall Cognitive Status: Difficult to assess Safety/Judgment: Appears intact Sensation Sensation Light Touch: Appears Intact Proprioception: Appears Intact Coordination Gross Motor Movements are Fluid and Coordinated:  No Fine Motor Movements are Fluid and Coordinated: No Coordination and Movement Description: mild r sided dysmetria in RUE and RLE Heel Shin Test: mild ROM deficits on the R side Motor  Motor Motor: Motor apraxia Motor - Skilled Clinical Observations: delayed initiation   Trunk/Postural Assessment  Cervical Assessment Cervical Assessment: Exceptions to [facility] (slight forward head) Thoracic Assessment Thoracic Assessment: Exceptions to [facility] (slight thoracic kyphosis) Lumbar Assessment Lumbar Assessment: Exceptions to [facility] (flexible posterior pelvic tilt in sitting) Postural Control Postural Control: Within Functional Limits (using B UE support on RW) Postural Limitations: delayed  Balance   Extremity Assessment      RLE Assessment RLE Assessment: Exceptions to [facility] General Strength Comments: grossly 4/5 during functional mobility. pt with difficulty following instruction for MMT LLE Assessment LLE Assessment: Exceptions to [facility] General Strength Comments: grossly 4/5 to 4+/5 during functional mobility. diffiuclty following instruction with MMT  Care Tool Care Tool Bed Mobility Roll left and right activity   Roll left and right assist level: Minimal Assistance -  Patient > 75%    Sit to lying activity   Sit to lying assist level: Moderate Assistance - Patient 50 - 74%    Lying to sitting on side of bed activity   Lying to sitting on side of bed assist level: the ability to move from lying on the back to sitting on the side of the bed with no back support.: Moderate Assistance - Patient 50 - 74%     Care Tool Transfers Sit to stand transfer   Sit to stand assist level: Moderate Assistance - Patient 50 - 74%    Chair/bed transfer   Chair/bed transfer assist level: Moderate Assistance - Patient 50 - 74%     Psychologist, clinical transfer assist level: Maximal Assistance - Patient 25 - 49% Assistive Device Car Transfer Comment: RW    Care Tool Locomotion Ambulation   Assist level: Moderate Assistance - Patient 50 - 74% Assistive device: Parallel bars Max distance: 15  Walk 10 feet activity   Assist level: Moderate Assistance - Patient - 50 - 74% Assistive device: Parallel bars   Walk 50 feet with 2 turns activity Walk 50 feet with 2 turns activity did not occur: Safety/medical concerns      Walk 150 feet activity Walk 150 feet activity did not occur: Safety/medical concerns      Walk 10 feet on uneven surfaces activity Walk 10 feet on uneven surfaces activity did not occur: Safety/medical concerns      Stairs   Assist level: Moderate Assistance - Patient - 50 - 74% Stairs assistive device: 2 hand rails (paraller bars)    Walk up/down 1 step activity   Walk up/down 1 step (curb) assist level: Moderate Assistance - Patient - 50 - 74% Walk up/down 1 step or curb assistive device: 2 hand rails  Walk up/down 4 steps activity   Walk up/down 4 steps assist level: Moderate Assistance - Patient - 50 - 74%    Walk up/down 12 steps activity Walk up/down 12 steps activity did not occur: Safety/medical concerns      Pick up small objects from floor   Pick up small object from the floor assist level: Maximal Assistance  - Patient 25 - 49% Pick up small object from the floor assistive device: RW  Wheelchair Is the patient using a wheelchair?: Yes Type of Wheelchair: Manual   Wheelchair assist level: Moderate Assistance -  Patient 50 - 74% Max wheelchair distance: 100  Wheel 50 feet with 2 turns activity   Assist Level: Moderate Assistance - Patient 50 - 74%  Wheel 150 feet activity   Assist Level: Maximal Assistance - Patient 25 - 49%    Refer to Care Plan for Long Term Goals  SHORT TERM GOAL WEEK 1 PT Short Term Goal 1 (Week 1): Pt will perform bed mobiltiy with min assist PT Short Term Goal 2 (Week 1): Pt will trasnfer to [facility] with min assist and LRAD PT Short Term Goal 3 (Week 1): Pt will ambulate 25ft with min assist and LRAD PT Short Term Goal 4 (Week 1): Pt will propell WC 163ft with min assist  Recommendations for other services: None   Skilled Therapeutic Intervention Mobility Bed Mobility Bed Mobility: Rolling Right;Rolling Left;Sit to Supine;Supine to Sit Rolling Right: Minimal Assistance - Patient > 75% Rolling Left: Minimal Assistance - Patient > 75% Supine to Sit: Moderate Assistance - Patient 50-74% Sit to Supine: Moderate Assistance - Patient 50-74% Transfers Transfers: Sit to Stand;Stand to Sit;Stand Pivot Transfers Sit to Stand: Moderate Assistance - Patient 50-74% Stand to Sit: Moderate Assistance - Patient 50-74% Stand Pivot Transfers: Moderate Assistance - Patient 50 - 74% Transfer (Assistive device): None Locomotion  Gait Ambulation: Yes Gait Assistance: Minimal Assistance - Patient > 75%;Moderate Assistance - Patient 50-74% Gait Distance (Feet): 15 Feet Assistive device: Parallel bars Gait Gait: Yes Gait Pattern: Impaired Gait Pattern: Step-through pattern;Decreased step length - right;Decreased step length - left Stairs / Additional Locomotion Stairs: Yes Stairs Assistance: Moderate Assistance - Patient 50 - 74% Stair Management Technique: Two rails;Other  (comment) (parallel bars) Number of Stairs: 4 Height of Stairs: 6 Wheelchair Mobility Wheelchair Mobility: Yes Wheelchair Assistance: Moderate Assistance - Patient 50 - 74% Wheelchair Propulsion: Both upper extremities Wheelchair Parts Management: Needs assistance Distance: 125ft  Pt received supine in bed and agreeable to PT. Supine>sit transfer with mod assist and cues for sequencing. Stand pivot transfer to [facility] with no AD and mod-max assist and heavy cues for posture and step width. PT instructed patient in PT Evaluation and initiated treatment intervention; see above for results. PT educated patient in [place], rehab potential, rehab goals, and discharge recommendations along with recommendation for follow-up rehabilitation services. Sit<>stand from [facility] with mod assist and cues for posture and activation BLE due to poor activation of BLE. Gait training In parallel bars with mod assist  x 29ft and with RW x 4ft with min-mod assist for safety. Car transfer with mod assist and max cues for step width. WC mobility with BUE x 153ft with mod assist to control of WC and prevent veer to the R. Patient returned to room and left sitting in [facility] with call bell in reach and all needs met.        Discharge Criteria: Patient will be discharged from PT if patient refuses treatment 3 consecutive times without medical reason, if treatment goals not met, if there is a change in medical status, if patient makes no progress towards goals or if patient is discharged from hospital.  The above assessment, treatment plan, treatment alternatives and goals were discussed and mutually agreed upon: by patient  Lorie Phenix 06/12/2022, 10:14 AM

## 2022-06-12 NOTE — Plan of Care (Signed)
  Problem: RH BLADDER ELIMINATION Goal: RH STG MANAGE BLADDER WITH ASSISTANCE Description: STG Manage Bladder With toileting Assistance Outcome: Not Progressing; incontinence   

## 2022-06-13 DIAGNOSIS — I63512 Cerebral infarction due to unspecified occlusion or stenosis of left middle cerebral artery: Secondary | ICD-10-CM | POA: Diagnosis not present

## 2022-06-13 NOTE — Progress Notes (Signed)
PROGRESS NOTE   Subjective/Complaints: Doing well this morning, has no new complaints Patient's chart reviewed- No issues reported overnight Vitals signs stable except for bradycardia to 44  ROS-Limited by aphasia   Objective:   No results found. Recent Labs    06/12/22 0630  WBC 5.6  HGB 11.0*  HCT 32.0*  PLT 240   Recent Labs    06/12/22 0630  NA 136  K 4.2  CL 104  CO2 27  GLUCOSE 108*  BUN 19  CREATININE 1.06*  CALCIUM 8.6*    Intake/Output Summary (Last 24 hours) at 06/13/2022 1316 Last data filed at 06/13/2022 1251 Gross per 24 hour  Intake 1011 ml  Output --  Net 1011 ml        Physical Exam: Vital Signs Blood pressure 118/83, pulse (!) 44, temperature (!) 97.2 F (36.2 C), temperature source Oral, resp. rate 18, height 5\' 3"  (1.6 m), weight 83.2 kg, SpO2 100 %.   General: No acute distress Mood and affect are appropriate Heart: Bradycardic Lungs: Clear to auscultation, breathing unlabored, no rales or wheezes Abdomen: Positive bowel sounds, soft nontender to palpation, nondistended Extremities: No clubbing, cyanosis, or edema Skin: No evidence of breakdown, no evidence of rash Neurologic: alert , follows simple commands, fluent aphasia Cranial nerves II through XII intact, motor strength is 5/5 in left 4/5 right  deltoid, bicep, tricep, grip, hip flexor, knee extensors, ankle dorsiflexor and plantar flexor Sensory exam cannot assess aphasic  Cerebellar exam normal finger to nose to finger ain UE Musculoskeletal: Full range of motion in all 4 extremities. No joint swelling   Assessment/Plan: 1. Functional deficits which require 3+ hours per day of interdisciplinary therapy in a comprehensive inpatient rehab setting. Physiatrist is providing close team supervision and 24 hour management of active medical problems listed below. Physiatrist and rehab team continue to assess barriers to  discharge/monitor patient progress toward functional and medical goals  Care Tool:  Bathing    Body parts bathed by patient: Right arm, Left arm, Chest, Abdomen, Front perineal area, Right lower leg, Right upper leg, Face         Bathing assist Assist Level: Moderate Assistance - Patient 50 - 74%     Upper Body Dressing/Undressing Upper body dressing   What is the patient wearing?: Pull over shirt    Upper body assist Assist Level: Minimal Assistance - Patient > 75%    Lower Body Dressing/Undressing Lower body dressing      What is the patient wearing?: Pants     Lower body assist Assist for lower body dressing: Maximal Assistance - Patient 25 - 49%     Toileting Toileting    Toileting assist Assist for toileting: Maximal Assistance - Patient 25 - 49%     Transfers Chair/bed transfer  Transfers assist     Chair/bed transfer assist level: Moderate Assistance - Patient 50 - 74% Chair/bed transfer assistive device: Armrests, Programmer, multimedia   Ambulation assist      Assist level: Moderate Assistance - Patient 50 - 74% Assistive device: Parallel bars Max distance: 15   Walk 10 feet activity   Assist  Assist level: Moderate Assistance - Patient - 50 - 74% Assistive device: Parallel bars   Walk 50 feet activity   Assist Walk 50 feet with 2 turns activity did not occur: Safety/medical concerns         Walk 150 feet activity   Assist Walk 150 feet activity did not occur: Safety/medical concerns         Walk 10 feet on uneven surface  activity   Assist Walk 10 feet on uneven surfaces activity did not occur: Safety/medical concerns         Wheelchair     Assist Is the patient using a wheelchair?: Yes Type of Wheelchair: Manual    Wheelchair assist level: Moderate Assistance - Patient 50 - 74% Max wheelchair distance: 100    Wheelchair 50 feet with 2 turns activity    Assist        Assist Level:  Moderate Assistance - Patient 50 - 74%   Wheelchair 150 feet activity     Assist      Assist Level: Maximal Assistance - Patient 25 - 49%   Blood pressure 118/83, pulse (!) 44, temperature (!) 97.2 F (36.2 C), temperature source Oral, resp. rate 18, height 5\' 3"  (1.6 m), weight 83.2 kg, SpO2 100 %.  Medical Problem List and Plan: 1. Functional deficits secondary to bilateral left greater than right acute scattered punctate infarct due to left ICA and MCA occlusion status post revascularization as well as history of ischemic cerebrovascular accident December 2023- embolic stroke DOAC failure              -patient may  shower             -ELOS/Goals: 14-16 days, min assist to sup goals with PT, OT, min assist with SLP  Continue CIR 2.  Antithrombotics: -DVT/anticoagulation: Eliquis             -antiplatelet therapy: N/A 3. Pain Management: Tylenol as needed 4. Mood/Behavior/Sleep: Provide emotional support             -antipsychotic agents: N/A 5. Neuropsych/cognition: This patient is not capable of making decisions on her own behalf. 6. Skin/Wound Care: Routine skin checks 7. Fluids/Electrolytes/Nutrition: Routine in and outs with follow-up chemistries 8.  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  Follow-up outpatient cardiology.  Continue amiodarone 100 mg daily 9.  CKD.  Baseline creatinine 1.20-1.30.  Follow-up chemistries on admit. 10.  Diastolic congestive heart failure.  Monitor for any signs of fluid overload.  Continue Entresto 24-26 mg twice daily             -daily weights, messaged RN to weigh patient on 1/6 11.  Hypothyroidism.  Synthroid 12.  Hyperlipidemia.  Lipitor 13.  Constipation.  continue MiraLAX daily, Colace 100 mg twice daily 14. Bradycardia: continue to monitor HR TID. F/u with cardiology regarding amiodarone dosing if symptomatic    LOS: 2 days A FACE TO FACE EVALUATION WAS PERFORMED  Deshay Blumenfeld P Ekaterina Denise 06/13/2022, 1:16 PM

## 2022-06-13 NOTE — Plan of Care (Signed)
  Problem: RH Comprehension Communication Goal: LTG Patient will comprehend basic/complex auditory (SLP) Description: LTG: Patient will comprehend basic/complex auditory information with cues (SLP). Flowsheets (Taken 06/13/2022 0841) LTG: Patient will comprehend: Basic auditory information LTG: Patient will comprehend auditory information with cueing (SLP): Supervision   Problem: RH Expression Communication Goal: LTG Patient will express needs/wants via multi-modal(SLP) Description: LTG:  Patient will express needs/wants via multi-modal communication (gestures/written, etc) with cues (SLP) Flowsheets (Taken 06/13/2022 0841) LTG: Patient will express needs/wants via multimodal communication (gestures/written, etc) with cueing (SLP): Minimal Assistance - Patient > 75% Goal: LTG Patient will verbally express basic/complex needs(SLP) Description: LTG:  Patient will verbally express basic/complex needs, wants or ideas with cues  (SLP) Flowsheets (Taken 06/13/2022 0841) LTG: Patient will verbally express basic/complex needs, wants or ideas (SLP): Moderate Assistance - Patient 50 - 74% Goal: LTG Patient will increase word finding of common (SLP) Description: LTG:  Patient will increase word finding of common objects/daily info/abstract thoughts with cues using compensatory strategies (SLP). Flowsheets (Taken 06/13/2022 0841) LTG: Patient will increase word finding of common (SLP): Minimal Assistance - Patient > 75% Patient will use compensatory strategies to increase word finding of: Common objects

## 2022-06-13 NOTE — Progress Notes (Signed)
Speech Language Pathology Daily Session Note  Patient Details  Name: [name] MRN: [id number] Date of Birth: 1940-11-03  Today's Date: 06/13/2022 SLP Individual Time: 1401-1500 SLP Individual Time Calculation (min): 59 min  Short Term Goals: Week 1: SLP Short Term Goal 1 (Week 1): Patient will name common objects with 50% accuracy given mod A verbal/visual cues SLP Short Term Goal 2 (Week 1): Patient will utilize multimodal communication means (e.g., picture based communication board) to communicate functional needs during 3/5 opportunities throughout session with mod A verbal/visual cues for effectiveness SLP Short Term Goal 3 (Week 1): Patient will complete automatic speech tasks with 50% accuracy given mod A verbal and visual cues SLP Short Term Goal 4 (Week 1): Patient will follow 1-step commands with max A multimodal cues to achieve 40% accuracy SLP Short Term Goal 5 (Week 1): Patient will respond to semi-complex yes/no questions with mod A verbal/visual cues to achieve 50% accuracy  Skilled Therapeutic Interventions: Pt seen for skilled ST with focus on communication goals, pt in wheelchair and agreeable to therapeutic tasks. SLP facilitating naming of common objects with patient able to independently name with ~30% accuracy. With initial letter cue accuracy increased to 60%, with initial letter and modeled initial sound accuracy increased to 80%. Pt continues to require max cues to recognize errored speech and attempt to repair. Spouse and son present for part of treatment and educated on exercises to complete with patient to promote expressive and receptive Agricultural engineer. Pt able to follow 1-step commands with min-mod cues with 60% accuracy during functional tasks. Pt left in wheelchair with family present for needs, cont ST POC.   Pain Pain Assessment Pain Scale: 0-10 Pain Score: 0-No pain  Therapy/Group: Individual Therapy  Dewaine Conger 06/13/2022, 2:58 PM

## 2022-06-13 NOTE — Progress Notes (Signed)
Occupational Therapy Session Note  Patient Details  Name: Rebekah Pope MRN: [id number] Date of Birth: 1941-01-26  Today's Date: 06/13/2022 OT Individual Time:  -  missed 60 minute session      Short Term Goals: Week 1:  OT Short Term Goal 1 (Week 1): Pt will complete toilet transfer using LRAD min A OT Short Term Goal 2 (Week 1): Pt will complete LB dressing min A OT Short Term Goal 3 (Week 1): Pt will utilize RUE during functional tasks ~75% with supervision OT Short Term Goal 4 (Week 1): Pt will complete sit>stand for BADLs min A using LRAD  Skilled Therapeutic Interventions/Progress Updates: patietn compplained of great fatigue following prior therapy session and stated she needed to rest before her next therapy.   Continue OT POC as tolerated by patient.     Therapy Documentation Precautions:  Precautions Precautions: Fall, Other (comment) Precaution Comments: expressive aphasia Restrictions Weight Bearing Restrictions: No General: General OT Amount of Missed Time: 60 Minutes  Pain Assessment Pain Scale: 0-10 Pain Score: 0-No pain    Other Treatments:     Therapy/Group: Individual Therapy  Alfredia Ferguson [facility] 06/13/2022, 4:23 PM

## 2022-06-13 NOTE — Progress Notes (Signed)
Physical Therapy Session Note  Patient Details  Name: Rebekah Pope MRN: [id number] Date of Birth: 1940-09-13  Today's Date: 06/13/2022 PT Individual Time: 0807-0919 PT Individual Time Calculation (min): 72 min   Short Term Goals: Week 1:  PT Short Term Goal 1 (Week 1): Pt will perform bed mobiltiy with min assist PT Short Term Goal 2 (Week 1): Pt will trasnfer to [facility] with min assist and LRAD PT Short Term Goal 3 (Week 1): Pt will ambulate 35ft with min assist and LRAD PT Short Term Goal 4 (Week 1): Pt will propell WC 1110ft with min assist  Skilled Therapeutic Interventions/Progress Updates:    Pt received supported upright in bed awake and agreeable to therapy session. Pt continues to present with expressive aphasia. Supine>sitting L EOB, HOB elevated to 25degrees and bedrail available but pt not using, with mod assist for bringing trunk upright and scooting hips forward towards EOB with max cuing to maintain anterior trunk lean/weight shift to prevent posterior LOB. Upon questioning, pt reports need to use bathroom.   Sit>stand EOB>RW with heavy mod assist for lifting to stand with pt having poor ability to power up through her LEs.  Gait training to bathroom using RW with heavy min assist for balance and AD management with pt lacking sufficient L weight shift, shuffling R foot forward without foot clearance and very short step length (step-to pattern leading with LLE), forward trunk flexed posture - attempted to cue for improvement but minimal change.  Standing with CGA for balance using B UE support on RW while therapist performed dependent LB clothing management and peri-care - pt had been incontinent of bladder and further continent on toilet with urine noted to be cloudy (charted in flowsheets).  Initiated gait training back into room using RW, but pt with significant fatigue with gradually worsening forward trunk flexed posture, which pt was not able to correct despite max cuing  and manual facilitation, resulting in pt starting to push AD too far forward and crouch down with hip/knee flexion - pt unable to recover upright stance therefore therapist alerted for +2 assist and provided heavy mod assist to maintain standing while the recliner as brought up behind pt for seated break.  Assessed vitals sitting in recliner: BP 128/80 (MAP 74), HR 56-60bpm, SpO2 97% After seated rest break pt appears to feel better - due to aphasia she is unable to correctly communicate how she was feeling, but appears to have just become fatigued.  Sitting in recliner, threaded on pants with mod assist. Sit>stand recliner>RW with heavy mod assist again for lifting into standing. Standing with B UE support on RW with light min assist for balance during dependent assist pulling pants up over hips.   Pt gesturing she wants to walk to w/c - gait training ~7ft to w/c using RW with continued heavy min assist and gait deviations as stated when pt ambulating into bathroom. Reassessed vitals: BP 101/63 (MAP 75), HR 53bpm, SpO2 98% - pt denies symptoms when asked but again may be difficult to determine due to aphasia.  Sitting in w/c doffed gown and donned shirt set-up assist. Sitting in w/c at sink washed face and performed oral care with set-up assist.   Transported to/from gym in w/c for time management and energy conservation.  Donned knee high TED hose.   Sit>stand w/c>RW with continued mod assist for lifting to stand and pt very slow to rise with impaired ability to power up through her LEs - cuing for increased  anterior trunk lean and pushing up with L hand from w/c armrest.  Gait training 73ft +62ft using RW with +2 providing w/c follow on 1st trial to allow increased distance, then pt able to turn and sit in chair during 2nd walk - pt demos improved upright posture, although still downward gaze, and improved L weight shift with increased R LE foot clearance and step length to achieve a min  reciprocal stepping pattern. Requires mod cuing for stepping LEs and turning with AD and pulling it back close to her when turning to sit in chair after 2nd gait trial. Vitals after 1st gait: BP 103/53 (MAP 69), HR 51bpm, SpO2 97%   R stand pivot chair>w/c using RW with lighter mod assist to come to standing and min assist for balance and AD management while turning.  Transported back to her room and pt left seated in w/c with needs in reach and seat belt alarm on.    Therapy Documentation Precautions:  Precautions Precautions: Fall, Other (comment) Precaution Comments: expressive aphasia Restrictions Weight Bearing Restrictions: No   Pain:  Due to aphasia difficult to understand, but appears to be reporting knee discomfort; however, when questioned about medication pt clearly responds "no." Provided seated rest breaks for pain management.     Therapy/Group: Individual Therapy  Ginny Forth , PT, DPT, NCS, CSRS 06/13/2022, 7:45 AM

## 2022-06-13 NOTE — Plan of Care (Signed)
  Problem: RH Balance Goal: LTG Patient will maintain dynamic sitting balance (PT) Description: LTG:  Patient will maintain dynamic sitting balance with assistance during mobility activities (PT) Flowsheets (Taken 06/13/2022 0554) LTG: Pt will maintain dynamic sitting balance during mobility activities with:: Supervision/Verbal cueing Goal: LTG Patient will maintain dynamic standing balance (PT) Description: LTG:  Patient will maintain dynamic standing balance with assistance during mobility activities (PT) Flowsheets (Taken 06/13/2022 0554) LTG: Pt will maintain dynamic standing balance during mobility activities with:: Contact Guard/Touching assist   Problem: RH Bed Mobility Goal: LTG Patient will perform bed mobility with assist (PT) Description: LTG: Patient will perform bed mobility with assistance, with/without cues (PT). Flowsheets (Taken 06/13/2022 0554) LTG: Pt will perform bed mobility with assistance level of: Contact Guard/Touching assist   Problem: RH Car Transfers Goal: LTG Patient will perform car transfers with assist (PT) Description: LTG: Patient will perform car transfers with assistance (PT). Flowsheets (Taken 06/13/2022 0554) LTG: Pt will perform car transfers with assist:: Contact Guard/Touching assist   Problem: RH Ambulation Goal: LTG Patient will ambulate in controlled environment (PT) Description: LTG: Patient will ambulate in a controlled environment, # of feet with assistance (PT). Flowsheets (Taken 06/13/2022 0554) LTG: Pt will ambulate in controlled environ  assist needed:: Contact Guard/Touching assist LTG: Ambulation distance in controlled environment: 193ft with LRAD Goal: LTG Patient will ambulate in home environment (PT) Description: LTG: Patient will ambulate in home environment, # of feet with assistance (PT). Flowsheets (Taken 06/13/2022 0554) LTG: Pt will ambulate in home environ  assist needed:: Contact Guard/Touching assist LTG: Ambulation distance in home  environment: 54ft with LRAD   Problem: RH Wheelchair Mobility Goal: LTG Patient will propel w/c in controlled environment (PT) Description: LTG: Patient will propel wheelchair in controlled environment, # of feet with assist (PT) Flowsheets (Taken 06/13/2022 0554) LTG: Pt will propel w/c in controlled environ  assist needed:: Supervision/Verbal cueing LTG: Propel w/c distance in controlled environment: 152ft Goal: LTG Patient will propel w/c in home environment (PT) Description: LTG: Patient will propel wheelchair in home environment, # of feet with assistance (PT). Flowsheets (Taken 06/13/2022 0554) LTG: Pt will propel w/c in home environ  assist needed:: Supervision/Verbal cueing LTG: Propel w/c distance in home environment: 41ft   Problem: RH Stairs Goal: LTG Patient will ambulate up and down stairs w/assist (PT) Description: LTG: Patient will ambulate up and down # of stairs with assistance (PT) Flowsheets (Taken 06/13/2022 0554) LTG: Pt will  ambulate up and down number of stairs: 1 step to access home

## 2022-06-14 ENCOUNTER — Inpatient Hospital Stay (HOSPITAL_COMMUNITY): Payer: Medicare PPO

## 2022-06-14 DIAGNOSIS — I63512 Cerebral infarction due to unspecified occlusion or stenosis of left middle cerebral artery: Secondary | ICD-10-CM | POA: Diagnosis not present

## 2022-06-14 NOTE — Progress Notes (Signed)
PROGRESS NOTE   Subjective/Complaints: Right sided lean and right lower extremity strength worse this morning. CT ordered and shows no evidence of new stroke. Strength seems improved later in the day.  ROS-+right sided weakness  Objective:   CT HEAD WO CONTRAST ( )  Result Date: 06/14/2022 CLINICAL DATA:  Neuro deficit.  Stroke suspected. EXAM: CT HEAD WITHOUT CONTRAST TECHNIQUE: Contiguous axial images were obtained from the base of the skull through the vertex without intravenous contrast. RADIATION DOSE REDUCTION: This exam was performed according to the departmental dose-optimization program which includes automated exposure control, adjustment of the mA and/or kV according to patient size and/or use of iterative reconstruction technique. COMPARISON:  MR head 06/05/2022 FINDINGS: Brain: No evidence of acute infarction, hemorrhage, hydrocephalus, extra-axial collection or mass lesion/mass effect. There is mild diffuse low-attenuation within the subcortical and periventricular white matter compatible with chronic microvascular disease. Prominence of sulci and ventricles compatible with mild brain atrophy. Vascular: No hyperdense vessel or unexpected calcification. Skull: Normal. Negative for fracture or focal lesion. Sinuses/Orbits: Paranasal sinuses and mastoid air cells are clear. Other: None IMPRESSION: 1. No acute intracranial abnormalities. 2. Chronic microvascular disease and brain atrophy. Electronically Signed   By: Signa Kell M.D.   On: 06/14/2022 10:11   Recent Labs    06/12/22 0630  WBC 5.6  HGB 11.0*  HCT 32.0*  PLT 240   Recent Labs    06/12/22 0630  NA 136  K 4.2  CL 104  CO2 27  GLUCOSE 108*  BUN 19  CREATININE 1.06*  CALCIUM 8.6*    Intake/Output Summary (Last 24 hours) at 06/14/2022 1406 Last data filed at 06/14/2022 0747 Gross per 24 hour  Intake 894 ml  Output --  Net 894 ml        Physical  Exam: Vital Signs Blood pressure (!) 148/58, pulse (!) 55, temperature 98.2 F (36.8 C), resp. rate 16, height 5\' 3"  (1.6 m), weight 83.2 kg, SpO2 98 %.   General: No acute distress Mood and affect are appropriate Heart: Bradycardic Lungs: Clear to auscultation, breathing unlabored, no rales or wheezes Abdomen: Positive bowel sounds, soft nontender to palpation, nondistended Extremities: No clubbing, cyanosis, or edema Skin: No evidence of breakdown, no evidence of rash Neurologic: alert , follows simple commands, fluent aphasia Cranial nerves II through XII intact, motor strength is 5/5 in left 4/5 right  deltoid, bicep, tricep, grip, hip flexor, knee extensors, ankle dorsiflexor and plantar flexor Sensory exam cannot assess aphasic  Cerebellar exam normal finger to nose to finger ain UE Musculoskeletal: Full range of motion in all 4 extremities. No joint swelling. Right sided lean  Assessment/Plan: 1. Functional deficits which require 3+ hours per day of interdisciplinary therapy in a comprehensive inpatient rehab setting. Physiatrist is providing close team supervision and 24 hour management of active medical problems listed below. Physiatrist and rehab team continue to assess barriers to discharge/monitor patient progress toward functional and medical goals  Care Tool:  Bathing    Body parts bathed by patient: Right arm, Left arm, Chest, Abdomen, Front perineal area, Right lower leg, Right upper leg, Face         Bathing assist Assist  Level: Moderate Assistance - Patient 50 - 74%     Upper Body Dressing/Undressing Upper body dressing   What is the patient wearing?: Pull over shirt    Upper body assist Assist Level: Minimal Assistance - Patient > 75%    Lower Body Dressing/Undressing Lower body dressing      What is the patient wearing?: Pants     Lower body assist Assist for lower body dressing: Maximal Assistance - Patient 25 - 49%     Toileting Toileting     Toileting assist Assist for toileting: Maximal Assistance - Patient 25 - 49%     Transfers Chair/bed transfer  Transfers assist     Chair/bed transfer assist level: Moderate Assistance - Patient 50 - 74% Chair/bed transfer assistive device: Armrests, Geologist, engineering   Ambulation assist      Assist level: Moderate Assistance - Patient 50 - 74% Assistive device: Parallel bars Max distance: 15   Walk 10 feet activity   Assist     Assist level: Moderate Assistance - Patient - 50 - 74% Assistive device: Parallel bars   Walk 50 feet activity   Assist Walk 50 feet with 2 turns activity did not occur: Safety/medical concerns         Walk 150 feet activity   Assist Walk 150 feet activity did not occur: Safety/medical concerns         Walk 10 feet on uneven surface  activity   Assist Walk 10 feet on uneven surfaces activity did not occur: Safety/medical concerns         Wheelchair     Assist Is the patient using a wheelchair?: Yes Type of Wheelchair: Manual    Wheelchair assist level: Moderate Assistance - Patient 50 - 74% Max wheelchair distance: 100    Wheelchair 50 feet with 2 turns activity    Assist        Assist Level: Moderate Assistance - Patient 50 - 74%   Wheelchair 150 feet activity     Assist      Assist Level: Maximal Assistance - Patient 25 - 49%   Blood pressure (!) 148/58, pulse (!) 55, temperature 98.2 F (36.8 C), resp. rate 16, height 5\' 3"  (1.6 m), weight 83.2 kg, SpO2 98 %.  Medical Problem List and Plan: 1. 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  Antithrombotics: -DVT/anticoagulation: Eliquis             -antiplatelet therapy: N/A 3. Pain Management: Tylenol as needed 4. Mood/Behavior/Sleep: Provide emotional support             -antipsychotic agents: N/A 5. Neuropsych/cognition: This patient is not capable of making decisions on her own behalf. 6. Skin/Wound Care: Routine skin checks 7. Fluids/Electrolytes/Nutrition: Routine in and outs with follow-up chemistries 8.  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  Follow-up outpatient cardiology.  Continue amiodarone 100 mg daily 9.  CKD.  Baseline creatinine 1.20-1.30.  Follow-up chemistries on admit. 10.  Diastolic congestive heart failure.  Monitor for any signs of fluid overload.  Continue Entresto 24-26 mg twice daily             -daily weights, messaged RN to weigh patient on 1/6 11.  Hypothyroidism.  continue Synthroid 12.  Hyperlipidemia.  continue Lipitor 13.  Constipation.  continue MiraLAX daily, Colace 100 mg twice daily 14. Bradycardia: continue to monitor HR TID. F/u with cardiology regarding amiodarone dosing if symptomatic    LOS: 3 days A FACE TO FACE EVALUATION WAS PERFORMED  Cataleya Cristina P Rolf Fells 06/14/2022, 2:06 PM

## 2022-06-14 NOTE — Progress Notes (Addendum)
During shift change this AM, pt could not stand up in bathroom. Pt required use of stedy 2 assist. Per night shift nurse and other staff, pt had been walking yesterday with RW x1 assist. Per night nurse, pt right lean worse. Vitals obtained, wnl. MD Raulkar notified of pt condition. New orders received.      06/14/22 0749  Vitals  Temp 98.2 F (36.8 C)  BP (!) 148/58  MAP (mmHg) 83  BP Location Left Arm  BP Method Automatic  Patient Position (if appropriate) Sitting  Pulse Rate (!) 55  Pulse Rate Source Monitor  Resp 16  MEWS COLOR  MEWS Score Color Green  Oxygen Therapy  SpO2 98 %  O2 Device Room Air  MEWS Score  MEWS Temp 0  MEWS Systolic 0  MEWS Pulse 0  MEWS RR 0  MEWS LOC 0  MEWS Score 0

## 2022-06-15 DIAGNOSIS — I63512 Cerebral infarction due to unspecified occlusion or stenosis of left middle cerebral artery: Secondary | ICD-10-CM | POA: Diagnosis not present

## 2022-06-15 LAB — RESPIRATORY PANEL BY PCR

## 2022-06-15 MED ORDER — SORBITOL 70 % SOLN
15.0000 mL | Freq: Every day | Status: DC | PRN
  Administered 2022-06-15: 15 mL via ORAL
  Filled 2022-06-15: qty 30

## 2022-06-15 NOTE — Progress Notes (Signed)
PROGRESS NOTE   Subjective/Complaints:  Severe fluent aphasia  ROS-+right sided weakness  Objective:   CT HEAD WO CONTRAST (5MM)  Result Date: 06/14/2022 CLINICAL DATA:  Neuro deficit.  Stroke suspected. EXAM: CT HEAD WITHOUT CONTRAST TECHNIQUE: Contiguous axial images were obtained from the base of the skull through the vertex without intravenous contrast. RADIATION DOSE REDUCTION: This exam was performed according to the departmental dose-optimization program which includes automated exposure control, adjustment of the mA and/or kV according to patient size and/or use of iterative reconstruction technique. COMPARISON:  MR head 06/05/2022 FINDINGS: Brain: No evidence of acute infarction, hemorrhage, hydrocephalus, extra-axial collection or mass lesion/mass effect. There is mild diffuse low-attenuation within the subcortical and periventricular white matter compatible with chronic microvascular disease. Prominence of sulci and ventricles compatible with mild brain atrophy. Vascular: No hyperdense vessel or unexpected calcification. Skull: Normal. Negative for fracture or focal lesion. Sinuses/Orbits: Paranasal sinuses and mastoid air cells are clear. Other: None IMPRESSION: 1. No acute intracranial abnormalities. 2. Chronic microvascular disease and brain atrophy. Electronically Signed   By: Kerby Moors M.D.   On: 06/14/2022 10:11   No results for input(s): "WBC", "HGB", "HCT", "PLT" in the last 72 hours.  No results for input(s): "NA", "K", "CL", "CO2", "GLUCOSE", "BUN", "CREATININE", "CALCIUM" in the last 72 hours.   Intake/Output Summary (Last 24 hours) at 06/15/2022 0858 Last data filed at 06/14/2022 2100 Gross per 24 hour  Intake 17523 ml  Output --  Net 17523 ml         Physical Exam: Vital Signs Blood pressure (!) 148/45, pulse (!) 44, temperature 98 F (36.7 C), temperature source Oral, resp. rate 16, height 5\' 3"  (1.6  m), weight 83.2 kg, SpO2 95 %.   General: No acute distress Mood and affect are appropriate Heart: Bradycardic Lungs: Clear to auscultation, breathing unlabored, no rales or wheezes Abdomen: Positive bowel sounds, soft nontender to palpation, nondistended Extremities: No clubbing, cyanosis, or edema Skin: No evidence of breakdown, no evidence of rash Neurologic: alert , follows simple commands, fluent aphasia Cranial nerves II through XII intact, motor strength is 5/5 in left 4/5 right  deltoid, bicep, tricep, grip, hip flexor, knee extensors, ankle dorsiflexor and plantar flexor Sensory exam cannot assess aphasic  Cerebellar exam normal finger to nose to finger ain UE Musculoskeletal: Full range of motion in all 4 extremities. No joint swelling. Right sided lean  Assessment/Plan: 1. Functional deficits which require 3+ hours per day of interdisciplinary therapy in a comprehensive inpatient rehab setting. Physiatrist is providing close team supervision and 24 hour management of active medical problems listed below. Physiatrist and rehab team continue to assess barriers to discharge/monitor patient progress toward functional and medical goals  Care Tool:  Bathing    Body parts bathed by patient: Right arm, Left arm, Chest, Abdomen, Front perineal area, Right lower leg, Right upper leg, Face         Bathing assist Assist Level: Moderate Assistance - Patient 50 - 74%     Upper Body Dressing/Undressing Upper body dressing   What is the patient wearing?: Pull over shirt    Upper body assist Assist Level: Minimal Assistance - Patient >  75%    Lower Body Dressing/Undressing Lower body dressing      What is the patient wearing?: Pants     Lower body assist Assist for lower body dressing: Maximal Assistance - Patient 25 - 49%     Toileting Toileting    Toileting assist Assist for toileting: Maximal Assistance - Patient 25 - 49%     Transfers Chair/bed  transfer  Transfers assist     Chair/bed transfer assist level: Moderate Assistance - Patient 50 - 74% Chair/bed transfer assistive device: Armrests, Programmer, multimedia   Ambulation assist      Assist level: Moderate Assistance - Patient 50 - 74% Assistive device: Parallel bars Max distance: 15   Walk 10 feet activity   Assist     Assist level: Moderate Assistance - Patient - 50 - 74% Assistive device: Parallel bars   Walk 50 feet activity   Assist Walk 50 feet with 2 turns activity did not occur: Safety/medical concerns         Walk 150 feet activity   Assist Walk 150 feet activity did not occur: Safety/medical concerns         Walk 10 feet on uneven surface  activity   Assist Walk 10 feet on uneven surfaces activity did not occur: Safety/medical concerns         Wheelchair     Assist Is the patient using a wheelchair?: Yes Type of Wheelchair: Manual    Wheelchair assist level: Moderate Assistance - Patient 50 - 74% Max wheelchair distance: 100    Wheelchair 50 feet with 2 turns activity    Assist        Assist Level: Moderate Assistance - Patient 50 - 74%   Wheelchair 150 feet activity     Assist      Assist Level: Maximal Assistance - Patient 25 - 49%   Blood pressure (!) 148/45, pulse (!) 44, temperature 98 F (36.7 C), temperature source Oral, resp. rate 16, height 5\' 3"  (1.6 m), weight 83.2 kg, SpO2 95 %.  Medical Problem List and Plan: 1. 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  Antithrombotics: -DVT/anticoagulation: Eliquis             -antiplatelet therapy: N/A 3. Pain Management: Tylenol as needed 4. Mood/Behavior/Sleep: Provide emotional support             -antipsychotic agents: N/A 5. Neuropsych/cognition: This patient is not capable of making decisions on her own behalf. 6. Skin/Wound Care: Routine skin checks 7. Fluids/Electrolytes/Nutrition: Routine in and outs with follow-up chemistries 8.  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  Follow-up outpatient cardiology.  Continue amiodarone 100 mg daily 9.  CKD.  Baseline creatinine 1.20-1.30.  Follow-up chemistries on admit. 10.  Diastolic congestive heart failure.  Monitor for any signs of fluid overload.  Continue Entresto 24-26 mg twice daily             -  daily weights, messaged RN to weigh patient on 1/6 11.  Hypothyroidism.  continue Synthroid 12.  Hyperlipidemia.  continue Lipitor 13.  Constipation.  continue MiraLAX daily, Colace 100 mg twice daily 14. Bradycardia: continue to monitor HR TID. F/u with cardiology regarding amiodarone dosing if symptomatic    LOS: 4 days A FACE TO FACE EVALUATION WAS PERFORMED  Erick Colace 06/15/2022, 8:58 AM

## 2022-06-15 NOTE — Progress Notes (Signed)
Occupational Therapy Session Note  Patient Details  Name: Ashonte Angelucci MRN: [id number] Date of Birth: Mar 28, 1941  Today's Date: 06/15/2022 OT Individual Time: 1130-1200 OT Individual Time Calculation (min): 30 min    Short Term Goals: Week 1:  OT Short Term Goal 1 (Week 1): Pt will complete toilet transfer using LRAD min A OT Short Term Goal 2 (Week 1): Pt will complete LB dressing min A OT Short Term Goal 3 (Week 1): Pt will utilize RUE during functional tasks ~75% with supervision OT Short Term Goal 4 (Week 1): Pt will complete sit>stand for BADLs min A using LRAD  Skilled Therapeutic Interventions/Progress Updates:    Patient agreeable to participate in OT session. Reports 0/10 pain level.   Patient participated in skilled OT session focusing on R NMR including fine motor coordination and hand strength. Therapist educated on HEP including hand strength with theraputty and coordination activities in order to improve functional use of her right UE during basic self care tasks such as grooming. Pt provided with verbal instruction, visual demonstration, and handout for reference. Pt verbalized and demonstrated understanding.  Hand strengthening:  - Tan putty, right hand, squeeze and release 1', lateral pinch 1', 3 point pinch 1'  Coordination tasks:  - Card flip; Using right hand with a pincher grasp, pt flipped each card 1 at a time over completing entire deck.   At end of session, called Daughter [name]) to provide an update on session. Pt ok'd communication with daughter.     Therapy Documentation Precautions:  Precautions Precautions: Fall, Other (comment) Precaution Comments: expressive aphasia Restrictions Weight Bearing Restrictions: No  Therapy/Group: Individual Therapy  Ailene Ravel, OTR/L,CBIS  Supplemental OT - [facility] and [facility] Secure Chat Preferred   06/15/2022, 7:59 AM

## 2022-06-15 NOTE — Patient Instructions (Signed)
Theraputty Home Exercise Program  Complete 1-2 times a day.  putty squeeze  Pt. should squeeze putty in hand trying to keep it round by rotating putty after each squeeze. push fingers through putty to palm each time. Complete for __2-3____ minutes.   PUTTY KEY GRIP  Hold the putty at the top of your hand. Squeeze the putty between your thumb and the side of your 2nd finger as shown. Complete for ____2-3____ minutes.    PUTTY 3 JAW CHUCK  Roll up some putty into a ball then flatten it. Then, firmly squeeze it with your first 3 fingers as shown. Complete for __2-3____ minutes.      Coordination Activities  Perform the following activities for 10-15 minutes 1-2 times per day with right hand(s).  Flip cards 1 at a time as fast as you can. Pick up coins and place in container or coin bank. Pick up coins and stack. Pick up coins one at a time until you get 5-10 in your hand, then move coins from palm to fingertips to stack one at a time.

## 2022-06-15 NOTE — Progress Notes (Signed)
Physical Therapy Session Note  Patient Details  Name: Rebekah Pope MRN: [id number] Date of Birth: August 20, 1940  Today's Date: 06/15/2022 PT Individual Time: 0932-3557 PT Individual Time Calculation (min): 45 min   Short Term Goals: Week 1:  PT Short Term Goal 1 (Week 1): Pt will perform bed mobiltiy with min assist PT Short Term Goal 2 (Week 1): Pt will trasnfer to [facility] with min assist and LRAD PT Short Term Goal 3 (Week 1): Pt will ambulate 46ft with min assist and LRAD PT Short Term Goal 4 (Week 1): Pt will propell WC 19ft with min assist  Skilled Therapeutic Interventions/Progress Updates:    Chart reviewed and pt agreeable to therapy. Pt received seated in recliner with no c/o pain. Session focused on functional mobility to promote home access. Pt initiated session with attempt to sit to stand with RW using MaxA + RW. Pt then given VC for technique to improve efficiency, but pt still unable to come to stand. Pt was then placed in front of bedrail and was able to stand with B handrail + ModA. PT then instructed pt for R hand rail placement and L hnd on chair to promote shift to LLE during stand. Pt then completed 3x sit to stand with MinA + R handrail + VC for set up. Pt then returned to standing practice with RW and completed 3x sit to stand with ModA + RW progressing to [place]. In standing, pt completed lateral weight shifts and L SLS, but was limited for R SLS 2/2 increasing R knee pain. Pt then sat and completed 3x8 B LAQ and resisted hip abd and was instructed to continue these exercises in recliner. At end of session, pt was left seated in recliner with alarm engaged, nurse call bell and all needs in reach.     Therapy Documentation Precautions:  Precautions Precautions: Fall, Other (comment) Precaution Comments: expressive aphasia Restrictions Weight Bearing Restrictions: No   Therapy/Group: Individual Therapy  Marquette Old, PT, DPT 06/15/2022, 3:54 PM

## 2022-06-15 NOTE — Progress Notes (Signed)
Occupational Therapy Session Note  Patient Details  Name: Rebekah Pope MRN: [id number] Date of Birth: May 25, 1941  Today's Date: 06/15/2022 OT Individual Time: 0900-1000 OT Individual Time Calculation (min): 60 min    Short Term Goals: Week 1:  OT Short Term Goal 1 (Week 1): Pt will complete toilet transfer using LRAD min Rebekah OT Short Term Goal 2 (Week 1): Pt will complete LB dressing min Rebekah OT Short Term Goal 3 (Week 1): Pt will utilize RUE during functional tasks ~75% with supervision OT Short Term Goal 4 (Week 1): Pt will complete sit>stand for BADLs min Rebekah using LRAD  Skilled Therapeutic Interventions/Progress Updates:     Pt received resting in bed sleeping easy to wake and receptive to skilled OT session. Pt reporting 0/10 pain. Pt presenting with continued expressive aphasia impaction communication during session- Pt encouraged to utilize gestures, pointing, and simple words to communicated. OT implemented using "yes/no" questions to encourage Pt interaction and support therapeutic rapport during session.   Pt reporting urgent need to use restroom upon OT arrival. Supine>EOB GCA. Sit>stand mod Rebekah with mod cues for hand placement and technique. Pt ambulated to bathroom using RW min Rebekah for RW +time d/t decreased step length. Pt noted to lean forward and look at ground during ambulation. Pt provided multimodal cues to lift head/trunk and fully extend through hips to increase safety and function during ambulation.   Pt completed 3/3 toileting tasks on elevated toilet seat min Rebekah for clothing management. Pt able to maintain dynamic sitting balance while completing peri care CGA. Pt donned shirt supervision and pants mod Rebekah while seated on toilet with skilled education provided on modified technique- will continue to encourage Pt to dress R side of body first. Pt sit>stand from toilet mod Rebekah using RW.   Pt completed dynamic standing balance activities at sink- brushing teeth and hair with with  CGA and mod cues for safety. Pt required rest break following tasks d/t decreased endurance.   Functional mobility and transfer training in room. Pt completed sit>stands and ambulated wc<>chair x2 with education provided on body mechanics, hand positioning, and safety. Pt progressing from mod to min Rebekah for sit>stands with min cuing.   Pt intelligibly speaking to therapist at end of session. Unable to understand Pt other than word "crazy". Pt provided therapeutic sport, listening, and educated Pt she is not "crazy" d/t her expressive aphasia. Pt tearful, but moral improved with mod encouragement. Pt was left resting in recliner with call bell in reach, chair alarm on, and all needs met at end of session. Plan to call DTR following session to provide update on Pt progress per DTR request.   Therapy Documentation Precautions:  Precautions Precautions: Fall, Other (comment) Precaution Comments: expressive aphasia Restrictions Weight Bearing Restrictions: No General:   Vital Signs:  Pain: Pain Assessment Pain Scale: 0-10 Pain Score: 0-No pain ADL: ADL Eating: Minimal assistance Where Assessed-Eating: Edge of bed Grooming: Minimal assistance Where Assessed-Grooming: Wheelchair Upper Body Bathing: Minimal assistance Where Assessed-Upper Body Bathing: Shower Lower Body Bathing: Moderate assistance Where Assessed-Lower Body Bathing: Shower Upper Body Dressing: Minimal assistance Where Assessed-Upper Body Dressing: Wheelchair Lower Body Dressing: Moderate assistance Where Assessed-Lower Body Dressing: Wheelchair Toileting: Moderate assistance Where Assessed-Toileting: Teacher, adult education: Moderate assistance Toilet Transfer Method: Proofreader: Grab bars, Raised toilet seat Tub/Shower Transfer: Not assessed Film/video editor: Moderate assistance Film/video editor Method: Designer, industrial/product: Shower seat with  back   Therapy/Group: Individual Therapy  Rebekah Pope  Rebekah Pope 06/15/2022, 12:31 PM

## 2022-06-15 NOTE — Progress Notes (Signed)
Speech Language Pathology Daily Session Note  Patient Details  Name: Rebekah Pope MRN: [id number] Date of Birth: Dec 03, 1940  Today's Date: 06/15/2022 SLP Individual Time: 1304-1400 SLP Individual Time Calculation (min): 56 min  Short Term Goals: Week 1: SLP Short Term Goal 1 (Week 1): Patient will name common objects with 50% accuracy given mod A verbal/visual cues SLP Short Term Goal 2 (Week 1): Patient will utilize multimodal communication means (e.g., picture based communication board) to communicate functional needs during 3/5 opportunities throughout session with mod A verbal/visual cues for effectiveness SLP Short Term Goal 3 (Week 1): Patient will complete automatic speech tasks with 50% accuracy given mod A verbal and visual cues SLP Short Term Goal 4 (Week 1): Patient will follow 1-step commands with max A multimodal cues to achieve 40% accuracy SLP Short Term Goal 5 (Week 1): Patient will respond to semi-complex yes/no questions with mod A verbal/visual cues to achieve 50% accuracy  Skilled Therapeutic Interventions: Skilled ST treatment focused on communication goals. Pt received in recliner chair and was able to verbally request "I need to go to the bathroom." SLP facilitated squat pivot transfer to bedside commode using RW as per established safety plan. Pt required min-to-mod A verbal cues to follow 1-step, concise, verbal commands during transfer. Pt returned to her recliner for remainder of session.   SLP facilitated automatic speech tasks with 30% accuracy with supervision A verbal cues progressing to 100% with mod verbal and written stimuli. Pt recited months of the year with 33% accuracy with supervision A verbal cues to initiate progressing to 66% with mod A verbal and written stimuli, and eventually to 91% accuracy given max A multimodal cues. Pt was able to recite counting 1-20 with 90% accuracy given sup A verbal cues to initiate.   SLP facilitated object naming with  17% accurate at independent level, progressing to 58% accuracy with phonemic cue only, progressing to 82% accuracy given phonemic + sentence completion cue; and 95% with max A multimodal cues. Pt was known to perseverate on the word "pen" and often included pen at the end of her responses (e.g., "glasses-pen") with minimal awareness.   Verbal fluency continues to be limited by significant neologisms, semantic, phonemic paraphasias, and perseveration. However, mild improvements were observed in pt's ability to communicate thoughts at the phrase level.   Son arrived at end of session. SLP educated regarding session today and communication strategies. SLP also contacted pt's daughter via phone with updates from session per dtr request.   Patient was left in recliner with alarm activated and immediate needs within reach at end of session. Continue per current plan of care.      Pain  None/denied  Therapy/Group: Individual Therapy  Rebekah Pope 06/15/2022, 1:22 PM

## 2022-06-16 DIAGNOSIS — I63512 Cerebral infarction due to unspecified occlusion or stenosis of left middle cerebral artery: Secondary | ICD-10-CM | POA: Diagnosis not present

## 2022-06-16 NOTE — Progress Notes (Signed)
PROGRESS NOTE   Subjective/Complaints:  Occ cough noted by family, daughter with RSV, pt tested positive   ROS-+right sided weakness  Objective:   CT HEAD WO CONTRAST ( )  Result Date: 06/14/2022 CLINICAL DATA:  Neuro deficit.  Stroke suspected. EXAM: CT HEAD WITHOUT CONTRAST TECHNIQUE: Contiguous axial images were obtained from the base of the skull through the vertex without intravenous contrast. RADIATION DOSE REDUCTION: This exam was performed according to the departmental dose-optimization program which includes automated exposure control, adjustment of the mA and/or kV according to patient size and/or use of iterative reconstruction technique. COMPARISON:  MR head 06/05/2022 FINDINGS: Brain: No evidence of acute infarction, hemorrhage, hydrocephalus, extra-axial collection or mass lesion/mass effect. There is mild diffuse low-attenuation within the subcortical and periventricular white matter compatible with chronic microvascular disease. Prominence of sulci and ventricles compatible with mild brain atrophy. Vascular: No hyperdense vessel or unexpected calcification. Skull: Normal. Negative for fracture or focal lesion. Sinuses/Orbits: Paranasal sinuses and mastoid air cells are clear. Other: None IMPRESSION: 1. No acute intracranial abnormalities. 2. Chronic microvascular disease and brain atrophy. Electronically Signed   By: Signa Kell M.D.   On: 06/14/2022 10:11   No results for input(s): "WBC", "HGB", "HCT", "PLT" in the last 72 hours.  No results for input(s): "NA", "K", "CL", "CO2", "GLUCOSE", "BUN", "CREATININE", "CALCIUM" in the last 72 hours.   Intake/Output Summary (Last 24 hours) at 06/16/2022 0725 Last data filed at 06/15/2022 1824 Gross per 24 hour  Intake 236 ml  Output --  Net 236 ml         Physical Exam: Vital Signs Blood pressure (!) 135/44, pulse (!) 43, temperature 98.9 F (37.2 C), temperature source  Oral, resp. rate 16, height 5\' 3"  (1.6 m), weight 83.2 kg, SpO2 96 %.   General: No acute distress Mood and affect are appropriate Heart: Bradycardic Lungs: Clear to auscultation, breathing unlabored, no rales or wheezes Abdomen: Positive bowel sounds, soft nontender to palpation, nondistended Extremities: No clubbing, cyanosis, or edema Skin: No evidence of breakdown, no evidence of rash Neurologic: alert , follows simple commands, fluent aphasia Cranial nerves II through XII intact, motor strength is 5/5 in left 4/5 right  deltoid, bicep, tricep, grip, hip flexor, knee extensors, ankle dorsiflexor and plantar flexor Sensory exam cannot assess aphasic  Cerebellar exam normal finger to nose to finger ain UE Musculoskeletal: Full range of motion in all 4 extremities. No joint swelling. Right sided lean  Assessment/Plan: 1. Functional deficits which require 3+ hours per day of interdisciplinary therapy in a comprehensive inpatient rehab setting. Physiatrist is providing close team supervision and 24 hour management of active medical problems listed below. Physiatrist and rehab team continue to assess barriers to discharge/monitor patient progress toward functional and medical goals  Care Tool:  Bathing    Body parts bathed by patient: Right arm, Left arm, Chest, Abdomen, Front perineal area, Right lower leg, Right upper leg, Face         Bathing assist Assist Level: Moderate Assistance - Patient 50 - 74%     Upper Body Dressing/Undressing Upper body dressing   What is the patient wearing?: Pull over shirt    Upper  body assist Assist Level: Minimal Assistance - Patient > 75%    Lower Body Dressing/Undressing Lower body dressing      What is the patient wearing?: Pants     Lower body assist Assist for lower body dressing: Maximal Assistance - Patient 25 - 49%     Toileting Toileting    Toileting assist Assist for toileting: Maximal Assistance - Patient 25 - 49%      Transfers Chair/bed transfer  Transfers assist     Chair/bed transfer assist level: Moderate Assistance - Patient 50 - 74% Chair/bed transfer assistive device: Armrests, Geologist, engineering   Ambulation assist      Assist level: Moderate Assistance - Patient 50 - 74% Assistive device: Parallel bars Max distance: 15   Walk 10 feet activity   Assist     Assist level: Moderate Assistance - Patient - 50 - 74% Assistive device: Parallel bars   Walk 50 feet activity   Assist Walk 50 feet with 2 turns activity did not occur: Safety/medical concerns         Walk 150 feet activity   Assist Walk 150 feet activity did not occur: Safety/medical concerns         Walk 10 feet on uneven surface  activity   Assist Walk 10 feet on uneven surfaces activity did not occur: Safety/medical concerns         Wheelchair     Assist Is the patient using a wheelchair?: Yes Type of Wheelchair: Manual    Wheelchair assist level: Moderate Assistance - Patient 50 - 74% Max wheelchair distance: 100    Wheelchair 50 feet with 2 turns activity    Assist        Assist Level: Moderate Assistance - Patient 50 - 74%   Wheelchair 150 feet activity     Assist      Assist Level: Maximal Assistance - Patient 25 - 49%   Blood pressure (!) 135/44, pulse (!) 43, temperature 98.9 F (37.2 C), temperature source Oral, resp. rate 16, height 5\' 3"  (1.6 m), weight 83.2 kg, SpO2 96 %.  Medical Problem List and Plan: 1. 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  Antithrombotics: -DVT/anticoagulation: Eliquis             -antiplatelet therapy: N/A 3. Pain Management: Tylenol as needed 4. Mood/Behavior/Sleep: Provide emotional support             -antipsychotic agents: N/A 5. Neuropsych/cognition: This patient is not capable of making decisions on her own behalf. 6. Skin/Wound Care: Routine skin checks 7. Fluids/Electrolytes/Nutrition: Routine in and outs with follow-up chemistries 8.  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  Follow-up outpatient cardiology.  Continue amiodarone 100 mg daily 9.  CKD.  Baseline creatinine 1.20-1.30.  Follow-up chemistries on admit. 10.  Diastolic congestive heart failure.  Monitor for any signs of fluid overload.  Continue Entresto 24-26 mg twice daily             -daily weights, messaged RN to weigh patient on 1/6 11.  Hypothyroidism.  continue Synthroid 12.  Hyperlipidemia.  continue Lipitor 13.  Constipation.  continue MiraLAX daily, Colace 100 mg twice daily 14. Bradycardia: continue to monitor HR TID. F/u with cardiology regarding amiodarone dosing if symptomatic   15.  RSV- on droplet prec, no cough , SOB , or resp distress noted on exam today.  Has been afebrile, no cough thus far this am.  Will cont supportive care.  LOS: 5 days A FACE TO FACE EVALUATION WAS PERFORMED  Charlett Blake 06/16/2022, 7:25 AM

## 2022-06-16 NOTE — Addendum Note (Signed)
Encounter addended by: Gerarda Gunther on: 06/16/2022 12:20 PM  Actions taken: Imaging Exam ended

## 2022-06-16 NOTE — Progress Notes (Signed)
Physical Therapy Session Note  Patient Details  Name: Rebekah Pope MRN: [id number] Date of Birth: December 19, 1940  Today's Date: 06/16/2022 PT Individual Time: 9563-8756 and 4332-9518 PT Individual Time Calculation (min): 43 min and 31 min  Short Term Goals: Week 1:  PT Short Term Goal 1 (Week 1): Pt will perform bed mobiltiy with min assist PT Short Term Goal 2 (Week 1): Pt will trasnfer to [facility] with min assist and LRAD PT Short Term Goal 3 (Week 1): Pt will ambulate 48ft with min assist and LRAD PT Short Term Goal 4 (Week 1): Pt will propell WC 134ft with min assist  Skilled Therapeutic Interventions/Progress Updates:    Session 1: Pt now on droplet precautions due to being RSV (+) - therapist donned appropriate PPE prior to entering room. Pt received supine in bed awake and eager to participate in therapy session. Pt continues to have expressive>receptive aphasia. Supine>sitting L EOB, HOB partially elevated and using bedrail, with supervision and then min assist to scoot hips towards EOB with increased time and effort.  Pt reports need to use bathroom.  Sit>stand EOB>RW with mod assist for lifting to stand - improving ability to power up in to stand compared to last time seen by this therapist - continues to require cuing to come fully upright once standing.  Gait training ~88ft in/out bathroom using RW with light min assist for balance/safety - pt demos improved R LE foot clearance during swing to achieve slight reciprocal stepping pattern and improved upright posture during gait. Standing with light min assist pt able to manage LB clothing with min assist. Pt had been incontinent of bladder in brief, but was further continent on toilet - noted urine was cloudy, notified nurse (water source also impacted by water line break so may not be true sample of pt's urine). Sit>stand from [facility] over toilet>RW with mod assist for lifting to stand, an improvement compared to last seen by this therapist.  Did allow pt a seated rest break prior to ambulating back out of bathroom, to increase pt safety.  Sitting in w/c at sink performed hand hygiene and oral care.    Transported to/from gym in w/c for time management and energy conservation.  Sit>stand w/c>RW with heavy min assist for lifting to stand - cuing to push up from w/c armrest.  Gait training ~38ft x2 using RW with light min assist for balance - continues to demo improving R LE foot clearance and step length (although still shorter than L LE), tactile cuing for increased and longer L stance time to improve this as well - cuign to maintain upright posture - decreased gait speed. Pt with fatigue occurring at end of 2nd walk requiring verbal encouragement to make it that distance with increased cuing to keep head up for improved posture.  Transported back to room and pt agreeable to remain sitting up in w/c until next session - left with needs in reach, seat belt alarm on, and therapist assisted pt with calling her daughter.   Session 2: Pt on droplet precautions, donned appropriate PPE prior to room entry. Pt received sitting in w/c reporting she was about to fall asleep but is agreeable to therapy session. Pt with improvement in expressive aphasia this afternoon with therapist able to make sense of more of her short phrases.   Transported to/from gym in w/c for time management and energy conservation.  Sit>stand w/c>RW with light mod assist for lifting to stand due continued difficulty powering up into standing -  continued min cuing for proper hand placement when coming to stand. Gait training ~45ft using RW with light min assist and continued tactile cuing for increased L weight shift to improve R LE foot clearance and step length due to it being impaired - cuing for upright posture and upward gaze. Pt reports onset of R knee pain during gait - declines medication administration.  Transitioned to seated activities due to knee pain.  Performed R UE fine motor activity using mini, white PEG board patterns to address NMR with pt having difficulty manipulating the small items in her hands to line up the PEGs but improved with increased repetition and therapist demonstration on how to use pointer finger to rotate the item.   Transported back to room and pt left seated in w/c with needs in reach, meal tray set-up, and seat belt alarm on. NT aware of pt's position.   Therapy Documentation Precautions:  Precautions Precautions: Fall, Other (comment) Precaution Comments: expressive aphasia Restrictions Weight Bearing Restrictions: No   Pain:  Session 1: R knee creaks and is painful during sit<>stands - pt denies medication - provided modifications of interventions for pain management.  Session 2: R knee pain - details above.    Therapy/Group: Individual Therapy  Ginny Forth , PT, DPT, NCS, CSRS 06/16/2022, 7:50 AM

## 2022-06-16 NOTE — IPOC Note (Signed)
Overall Plan of Care [facility]) Patient Details Name: Rebekah Pope MRN: [id number] DOB: 1941/03/03  Admitting Diagnosis: Left middle cerebral artery stroke [facility])  Hospital Problems: Principal Problem:   Left middle cerebral artery stroke [facility])     Functional Problem List: Nursing Bladder, Bowel, Safety, Endurance, Medication Management, Skin Integrity  PT Balance, Behavior, Endurance, Motor  OT Balance, Perception, Safety, Cognition, Endurance, Motor, Vision, Skin Integrity, Pain  SLP Linguistic, Motor  TR         Basic ADL's: OT Grooming, Bathing, Dressing, Toileting     Advanced  ADL's: OT Laundry     Transfers: PT Bed Mobility, Bed to Chair, Musician, Manufacturing systems engineer, Metallurgist: PT Ambulation, Emergency planning/management officer, Stairs     Additional Impairments: OT Fuctional Use of Upper Extremity  SLP Communication comprehension, expression    TR      Anticipated Outcomes Item Anticipated Outcome  Self Feeding Independent  Swallowing  NA   Basic self-care  CGA  Toileting  CGA   Bathroom Transfers CGA  Bowel/Bladder  manage bowel w mod I and bladder w toileting  Transfers  CGA with LRAD  Locomotion  CGA with LRAD  Communication  sup simple comprehension, min A expression through multimodal means  Cognition  NA  Pain  n/a  Safety/Judgment  manage w cues   Therapy Plan: PT Intensity: Minimum of 1-2 x/day ,45 to 90 minutes PT Frequency: 5 out of 7 days PT Duration Estimated Length of Stay: 14-18 days OT Intensity: Minimum of 1-2 x/day, 45 to 90 minutes OT Frequency: 5 out of 7 days OT Duration/Estimated Length of Stay: 2.5-3 weeks SLP Intensity: Minumum of 1-2 x/day, 30 to 90 minutes SLP Frequency: 3 to 5 out of 7 days SLP Duration/Estimated Length of Stay: 2-2.5 weeks   Team Interventions: Nursing Interventions Bladder Management, Medication Management, Discharge Planning, Patient/Family Education, Bowel Management, Disease  Management/Prevention  PT interventions Ambulation/gait training, Community reintegration, DME/adaptive equipment instruction, Neuromuscular re-education, Psychosocial support, Stair training, UE/LE Strength taining/ROM, Training and development officer, Discharge planning, Functional electrical stimulation, Pain management, Skin care/wound management, Therapeutic Activities, UE/LE Coordination activities, Cognitive remediation/compensation, Disease management/prevention, Functional mobility training, Splinting/orthotics, Patient/family education, Therapeutic Exercise, Visual/perceptual remediation/compensation, Wheelchair propulsion/positioning  OT Interventions Training and development officer, Discharge planning, Self Care/advanced ADL retraining, Therapeutic Activities, UE/LE Coordination activities, Pain management, Cognitive remediation/compensation, Functional mobility training, Patient/family education, Therapeutic Exercise, Visual/perceptual remediation/compensation, Community reintegration, Engineer, drilling, Neuromuscular re-education, Psychosocial support, UE/LE Strength taining/ROM, Functional electrical stimulation, Skin care/wound managment  SLP Interventions Multimodal communication approach, Speech/Language facilitation, Cueing hierarchy, Functional tasks, Patient/family education, Therapeutic Activities  TR Interventions    SW/CM Interventions Discharge Planning, Psychosocial Support, Patient/Family Education, Disease Management/Prevention   Barriers to Discharge MD  Medical stability  Nursing Decreased caregiver support, Home environment access/layout 1 level 3 ste no railing; stair lift to basement laundry w spouse  PT Incontinence, Decreased caregiver support    OT      SLP      SW Lack of/limited family support, Decreased caregiver support, Insurance underwriter for SNF coverage, Home environment Engineer, maintenance Discharge Planning: Destination: PT-Home ,OT- Home ,  SLP-Home Projected Follow-up: PT-Home health PT, Skilled nursing facility, OT-  Home health OT, Outpatient OT (need for follow-up services TBD based on Pt discharge location and available support/transportation), SLP-24 hour supervision/assistance, Home Health SLP Projected Equipment Needs: PT-Wheelchair cushion (measurements), Wheelchair (measurements), Rolling walker with 5" wheels, OT- To be determined, SLP-None recommended by SLP Equipment Details: PT- ,  OT-  Patient/family involved in discharge planning: PT- Patient,  OT-Patient, SLP-Patient, Family member/caregiver  MD ELOS: 14-16d Medical Rehab Prognosis:  Good Assessment: The patient has been admitted for CIR therapies with the diagnosis of Left MCA infarct. The team will be addressing functional mobility, strength, stamina, balance, safety, adaptive techniques and equipment, self-care, bowel and bladder mgt, patient and caregiver education, AFib rate control, monitor CHF symptoms. Goals have been set at [facility]. Anticipated discharge destination is Home vs ALF.        See Team Conference Notes for weekly updates to the plan of care

## 2022-06-16 NOTE — Progress Notes (Signed)
Patient ID: Rebekah Pope, female   DOB: [birth year], 82 y.o.   MRN: [id number]  Sw spoke with patient daughter, Kathrine Haddock. Sw informed daughter conference updates will be available tomorrow. Daughter familiar of SW from previous admission. No additional questions or concerns.

## 2022-06-16 NOTE — Progress Notes (Signed)
Speech Language Pathology Daily Session Note  Patient Details  Name: Rebekah Pope MRN: [id number] Date of Birth: [birth year]  Today's Date: 06/16/2022 SLP Individual Time: 1330-1415 SLP Individual Time Calculation (min): 45 min  Short Term Goals: Week 1: SLP Short Term Goal 1 (Week 1): Patient will name common objects with 50% accuracy given mod A verbal/visual cues SLP Short Term Goal 2 (Week 1): Patient will utilize multimodal communication means (e.g., picture based communication board) to communicate functional needs during 3/5 opportunities throughout session with mod A verbal/visual cues for effectiveness SLP Short Term Goal 3 (Week 1): Patient will complete automatic speech tasks with 50% accuracy given mod A verbal and visual cues SLP Short Term Goal 4 (Week 1): Patient will follow 1-step commands with max A multimodal cues to achieve 40% accuracy SLP Short Term Goal 5 (Week 1): Patient will respond to semi-complex yes/no questions with mod A verbal/visual cues to achieve 50% accuracy  Skilled Therapeutic Interventions: Skilled ST treatment focused on language goals. Pt greeted upright in wheelchair and expressed "I'm feeling okay."   Receptive language: SLP facilitated comprehension of yes/no questions. Pt responded to basic feature/functions with 90% accuracy with supervision A verbal cues; comparisons with 90% accuracy with sup A verbal cues; semi-complex, before/after type questions with 50% accuracy given supervision A verbal cues progressing to 60% accuracy with mod A multimodal cues. Pt followed 1-step step commands with with 81% accuracy given min A verbal cues and extended processing time, progressing to 100% with mod-to-max A multimodal cues.   Expressive language: Pt named objects using stimuli from tactus therapy "naming" app with 30% accuracy at independent level progressing to 70% accuracy given mod-to-max A verbal cues. Pt limited by neologisms, semantic, and phonemic  paraphasias. Overall spontaneous language appears to be improving however pt continues to be limited by frequent neologistic errors in which she appears inconsistently aware of.   Pt continues to excel with receptive language skills and demonstrating improvement in this area since initial evaluation.   Patient was left in wheelchair with alarm activated and immediate needs within reach at end of session. Continue per current plan of care.      Pain  None/denied  Therapy/Group: Individual Therapy  Patty Sermons 06/16/2022, 1:46 PM

## 2022-06-16 NOTE — Progress Notes (Signed)
Occupational Therapy Session Note  Patient Details  Name: Rebekah Pope MRN: [id number] Date of Birth: 1941-04-21  Today's Date: 06/16/2022 OT Individual Time: 1434-1530 OT Individual Time Calculation (min): 56 min    Short Term Goals: Week 1:  OT Short Term Goal 1 (Week 1): Pt will complete toilet transfer using LRAD min A OT Short Term Goal 2 (Week 1): Pt will complete LB dressing min A OT Short Term Goal 3 (Week 1): Pt will utilize RUE during functional tasks ~75% with supervision OT Short Term Goal 4 (Week 1): Pt will complete sit>stand for BADLs min A using LRAD  Skilled Therapeutic Interventions/Progress Updates:   Pt up in w/c upon OT arrival. No pain reported and no toileting needs. Pt known to this clinician from previous stay and noted improvement in visual perceptual skills and overall R eye alignment and oculomotor skills despite new onset of expressive aphasia and R UE distal coordination and balance deficits. Pt with some automatic phrases ie "I have no idea" or I don't think so" however deteriorates with more complex expression of thoughts and when attempting to convey responses. Able to follow all 1 step commands without issues. Pt was able to begin session with standing and seated level 1 lb bar exercises with 1 set of 10 reps in each plane standing with mod A for balance and 1 set seated for scap retraction, horiz abd/add and elbow flex/ext with brief rest between each set. Pt stood with RW for card flipping to isolate R hand digit manipulation and address balance and standing tolerance for 2 min with min A. Seated rest needed between 2 trials with fair to good manipulation for simple task. Completed session with visual perceptual lighthouse 8 piece puzzle. Required mod support at start of task and then was able to demonstrate attention, thought flexibility and visual skills to fade to min support to complete accurately. Spoke with dtr [name] to provide update as per team  communication of dtr preference. Left pt w/c level with chair alarm set and nurse call button and needs in reach.   Therapy Documentation Precautions:  Precautions Precautions: Fall, Other (comment) Precaution Comments: expressive aphasia Restrictions Weight Bearing Restrictions: No   Therapy/Group: Individual Therapy  Barnabas Lister 06/16/2022, 7:37 AM

## 2022-06-16 NOTE — Progress Notes (Signed)
Physical Therapy Session Note  Patient Details  Name: Rebekah Pope MRN: [id number] Date of Birth: 11/07/1940  Today's Date: 06/16/2022 PT Individual Time: 1000-1030 PT Individual Time Calculation (min): 30 min   Short Term Goals: Week 1:  PT Short Term Goal 1 (Week 1): Pt will perform bed mobiltiy with min assist PT Short Term Goal 2 (Week 1): Pt will trasnfer to [facility] with min assist and LRAD PT Short Term Goal 3 (Week 1): Pt will ambulate 59ft with min assist and LRAD PT Short Term Goal 4 (Week 1): Pt will propell WC 170ft with min assist  Skilled Therapeutic Interventions/Progress Updates:    Pt seated in w/c on arrival and agreeable to therapy. Pt reports occ R knee pain with mobility, resolved with rest breaks. Pt aphasic, with consistent ability to answer yes/no questions and single word responses, but continues to be limited by expressive aphasia. Session focused on gait with RW and CGA-min A/close w/c follow. Pt ambulated x 60 ft, x 80 ft, x 100 ft in this manner. VC for longer R step length which pt was able to maintain until she became fatigued. Seated rest breaks for pain and fatigue management. Pt returned to room after session and remained in w/c, was left with all needs in reach and alarm active.   Therapy Documentation Precautions:  Precautions Precautions: Fall, Other (comment) Precaution Comments: expressive aphasia Restrictions Weight Bearing Restrictions: No General:       Therapy/Group: Individual Therapy  Mickel Fuchs 06/16/2022, 12:45 PM

## 2022-06-17 DIAGNOSIS — I63512 Cerebral infarction due to unspecified occlusion or stenosis of left middle cerebral artery: Secondary | ICD-10-CM | POA: Diagnosis not present

## 2022-06-17 NOTE — Progress Notes (Addendum)
Patient ID: Rebekah Pope, female   DOB: 05-Jun-1941, 82 y.o.   MRN: [id number]  [facility] resources provided. Assisted Living Resource resources provided to [email or web address].

## 2022-06-17 NOTE — Progress Notes (Signed)
Full and final monitor report is in her chart (CV procedures) No AFib, flutter during the 2 weeks leading to her current hospitalization No sinus arrest or advanced heart block  Tommye Standard, PA-C

## 2022-06-17 NOTE — Progress Notes (Signed)
Speech Language Pathology Daily Session Note  Patient Details  Name: Rebekah Pope MRN: [id number] Date of Birth: Apr 06, 1941  Today's Date: 06/17/2022 SLP Individual Time: 1300-1400 SLP Individual Time Calculation (min): 60 min  Short Term Goals: Week 1: SLP Short Term Goal 1 (Week 1): Patient will name common objects with 50% accuracy given mod A verbal/visual cues SLP Short Term Goal 2 (Week 1): Patient will utilize multimodal communication means (e.g., picture based communication board) to communicate functional needs during 3/5 opportunities throughout session with mod A verbal/visual cues for effectiveness SLP Short Term Goal 3 (Week 1): Patient will complete automatic speech tasks with 50% accuracy given mod A verbal and visual cues SLP Short Term Goal 3 - Progress (Week 1): Met SLP Short Term Goal 4 (Week 1): Patient will follow 1-step commands with max A multimodal cues to achieve 40% accuracy SLP Short Term Goal 5 (Week 1): Patient will respond to semi-complex yes/no questions with mod A verbal/visual cues to achieve 50% accuracy  Skilled Therapeutic Interventions: Skilled ST treatment focused on communication goals. Pt was greeted upright in wheelchair on arrival and accompanied by her daughter. Patient exhibited some dry coughing throughout session and expressed she felt more under the weather today. Of note, pt with diagnosis of RSV.   SLP facilitated verbalization of pt's name and family member names. Pt produced her first and last name with sup A verbal cue to initiate; daughter and son's names with min-to-mod A verbal + written cues. Pt required mod A to formulate in a short phrase (e.g., "my daughter is Rebekah Pope"). Pt exhibited increased syntactical and phonemic errors (e.g., instead of "my other son is Rebekah Pope", pt elicited "my other run is runny."). Pt with minimal awareness of errors and when asked "did that sound right?" Pt would often respond "yes."  Automatic speech: 1-10:  90% accuracy independent progressing to 100% with written cue to correct error. DOW: 100% accuracy with sup A verbal cue to initiate. Months: 100% accuracy with sup A verbal cue to initiate. Pt was able to sing "Happy Birthday", "Amazing Rebekah Pope", and "Jingle Bells" with 50-70% accuracy with min A verbal cues.   Naming clothing items: 1/10 independent of cues, progressing to 30% with min A verbal cues (phonemic), progressing to 90% given mod A verbal cues (sentence completion and phonemic and at times gestural).   Naming food items: 1/6 independent of cues, progressing to 3/6 with max A multimodal cues. As session progressed, pt exhibited increased perseveration, neologistic errors, and semantic + phonemic paraphasias (e.g., "peacock" for "cake" and "milk-bread".)  SLP returned pt to her room and educated daughter regarding aphasia interventions, communication strategies, and activities to work on between sessions.   Patient was left in wheelchair with alarm activated and immediate needs within reach at end of session. Continue per current plan of care.      Pain Pain Assessment Pain Scale: 0-10 Pain Score: 0-No pain  Therapy/Group: Individual Therapy  Rebekah Pope 06/17/2022, 1:37 PM

## 2022-06-17 NOTE — Progress Notes (Signed)
Patient ID: Rebekah Pope, female   DOB: [birth year], 82 y.o.   MRN: [id number]  Team Conference Report to Patient/Family  Team Conference discussion was reviewed with the patient and caregiver, including goals, any changes in plan of care and target discharge date.  Patient and caregiver express understanding and are in agreement.  The patient has a target discharge date of 07/27/2022.  SW met with patient and daughter, [name] and provided conference updates. Daughter concerned about her father assisting patient at home. Sw will provide daughter with [facility] resources. Daughter also anticipates discussing with her brother the potential of him providing more assistance (lives on the same road and currently not working) or sharing the supervision/assistance responsibly. Daughter plans to split this by having patient and her spouse rotate.  between [place] and her home. SW will provide resources and allow family to discuss and make their determination for d/c plan.  [name] 06/17/2022, 1:43 PM

## 2022-06-17 NOTE — Progress Notes (Signed)
Physical Therapy Session Note  Patient Details  Name: Rebekah Pope MRN: [id number] Date of Birth: 01/25/1941  Today's Date: 06/17/2022 PT Individual Time: 9758-8325 PT Individual Time Calculation (min): 76 min   Short Term Goals: Week 1:  PT Short Term Goal 1 (Week 1): Pt will perform bed mobiltiy with min assist PT Short Term Goal 2 (Week 1): Pt will trasnfer to [facility] with min assist and LRAD PT Short Term Goal 3 (Week 1): Pt will ambulate 58ft with min assist and LRAD PT Short Term Goal 4 (Week 1): Pt will propell WC 122ft with min assist  Skilled Therapeutic Interventions/Progress Updates:    Pt on droplet precautions, donned appropriate PPE prior to room entry. Pt received sitting in w/c with her daughter, [name], present and pt agreeable to therapy session. 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 Asked pt's daughter to bring pt in tennis shoes. Transported to/from gym in w/c for time management and energy conservation.  Sit>stand w/c>RW with heavy min/light mod assist for lifting to stand (continues to require this amount of assist to come to stand throughout session) - repeated question cuing to recall need to push up from seat - pt with worsening R knee pain during this transfer throughout session.  Gait training 51ft using RW with light min assist for safety/balance - pt continues to demo excessive anterior trunk lean with increased B UE WBing throughout AD, starts with step-to pattern leading with L LLE, after cuing and manual facilitation to prolong L stance and increase R LE foot clearance and step length improvement to min reciprocal stepping pattern.   Upon coming to stand pt with increasing R knee pain but pt reports feeling she can continue with additional gait training.   Gait training additional 23ft using RW with same gait deviations as above but increasing forward trunk flexed posture and B UE support on  AD due to increased R knee pain.  Notified nurse for medication administration.  Performed seated R LE knee flexion/extension AROM for pain management.  Gait training additional 61ft using RW back to w/c with light min assist and pt demoing improvement in gait mechanics reporting decreased R knee pain this time.  After seated rest break additional gait training ~6ft to EOM using RW with light min assist and pt reporting increasing R LE knee pain at this time with pt having more forward flexed posture with increased B UE WBing through AD to off-weight LE.  Dynamic standing balance and standing tolerance task standing with UE support on RW as needed while transitioning clothespins from table to basketball goal - light min assist for balance - pt initially not confident to reach slightly outside BOS but improved with repetition gradually reaching further.   Pt blowing nose frequently during session with a couple instances of coughing.  Gait training ~55ft from mat back to w/c using RW with pt having increasing R knee pain limiting gait distance and pt continuing to compensate with increased B UE WBing through AD and slower gait speed with smaller steps. Notified nurse to discuss options of ensuring pt premedicated for pain in preparation for therapy sessions to allow increased participation.  Transitioned to sitting in w/c, R UE fine motor task for NMR of replicating picture on mini PEG board as pt was very attentive and interested in this yesterday with a good challenge - continues to require increased time to manipulate the small object in her fingers.  Transported back to room and left seated in w/c with needs in reach, seat belt alarm on, and her daughter present.  Therapy Documentation Precautions:  Precautions Precautions: Fall, Other (comment) Precaution Comments: expressive aphasia Restrictions Weight Bearing Restrictions: No   Pain: Continues to have R knee pain/discomfort -  medication administered by nurse and modified interventions for pain management.   Therapy/Group: Individual Therapy  Tawana Scale , PT, DPT, NCS, CSRS 06/17/2022, 3:28 PM

## 2022-06-17 NOTE — Patient Care Conference (Signed)
Inpatient RehabilitationTeam Conference and Plan of Care Update Date: 06/17/2022   Time:  10:37 AM   Patient Name: Rebekah Pope      Medical Record Number: [id number]  Date of Birth: [birth year] Sex: Female         Room/Bed: 4M05C/4M05C-01 Payor Info: Payor: HU[employer]NA MEDICARE / Plan: HU[employer] MEDICARE CHOICE PPO / Product Type: *No Product type* /    Admit Date/Time:  06/11/2022  4:10 PM  Primary Diagnosis:  Left middle cerebral artery stroke [facility])  Hospital Problems: Principal Problem:   Left middle cerebral artery stroke [facility])    Expected Discharge Date: Expected Discharge Date: 07/28/2022  Team Members Present: Physician leading conference: Dr. Alysia Penna Social Worker Present: Erlene Quan, BSW Nurse Present: Dorien Chihuahua, RN PT Present: Page Spiro, PT OT Present: Other (comment) Lowella Fairy, OT) SLP Present: Sherren Kerns, SLP PPS Coordinator present : Gunnar Fusi, SLP     Current Status/Progress Goal Weekly Team Focus  Bowel/Bladder   Continent with some intermittent incontinence of bowel and bladder   Regain full continence   Assist with toileting needs    Swallow/Nutrition/ Hydration               ADL's   toilet transfer max A, shower transfer mod-max A using RW, UB bathing/dressing CGA for sitting balance, LB dressing mod, LB dressing mod-max; grooming set-up at wc level. Pt fluctuates with energy levels impacting need for assitance during BADLs and transfers   CGA for BADLs to min for transfers   BADL retraining, transfer training, functional mobility, FM/VM coordination, d/c planning, family education    Mobility   supervision bed mobility using bed features, mod assist sit>stand using RW (impaired ability to power up into standing), min assist stand pivot using RW once in standing, gait up to 75ft using RW with min assist with improving R LE foot clearance and step length (although still less than L LE)   CGA/supervision overall at  ambulatory level  pt education, bed mobility training, transfer training, gait training using LRAD, dynamic standing balance, R hemibody NMR, activity tolerance    Communication   min A comprehension, mod-to-max A expression   sup A comprehension, min-to-mod A expression   following commands (improving), understanding semi-complex y/n questions, word finding, using multimodal communication to communicate needs    Safety/Cognition/ Behavioral Observations               Pain   no c/o pain   Remain pain free   Assess Qshift and prn    Skin   MASD to sacrum, remainder of skin intact   Maintain skin integrity  Assess Qshift and prn      Discharge Planning:  Discharging home with spouse able to provide light Min A. Daughter located out of town.   Team Discussion: Patient admitted post left MCA CVA with RSV+ (on precautions through 06/22/22). Progress limited by fatigue; MD checking for UTI  Patient on target to meet rehab goals: yes, currently needs supervision for upper body care and mod assist for lower body care. Needs supervision - min assist to sit EOB and mod assist for stand pivot transfers, Able to ambulate up to 63' with min assist using a RW.  Needs min assist for basic comprehension although spontaneous speech has improved. Working on word finding deficits using a Data processing manager and multimodal cues. Goals for expression set for min - mod assist.   *See Care Plan and progress notes for long and short-term goals.  Revisions to Treatment Plan:  N/A  Teaching Needs: Safety, medications, secondary risk management, transfers, toileting, etc.   Current Barriers to Discharge: Decreased caregiver support  Possible Resolutions to Barriers: Family education HH follow up services     Medical Summary Current Status: +RSV on droplet prec until 1/15, some lability with sys BP, no signs of CHF, Afib rate controlled     Possible Resolutions to [employer]:  per OT required more assist today , check UA   Continued Need for Acute Rehabilitation Level of Care: The patient requires daily medical management by a physician with specialized training in physical medicine and rehabilitation for the following reasons: Direction of a multidisciplinary physical rehabilitation program to maximize functional independence : Yes Medical management of patient stability for increased activity during participation in an intensive rehabilitation regime.: Yes Analysis of laboratory values and/or radiology reports with any subsequent need for medication adjustment and/or medical intervention. : Yes   I attest that I was present, lead the team conference, and concur with the assessment and plan of the team.   Dorien Chihuahua B 06/17/2022, 5:24 PM

## 2022-06-17 NOTE — Progress Notes (Signed)
Occupational Therapy Session Note  Patient Details  Name: Rebekah Pope MRN: [id number] Date of Birth: [birth year]  Today's Date: 06/17/2022 OT Individual Time: 4098-1191 OT Individual Time Calculation (min): 70 min    Short Term Goals: Week 1:  OT Short Term Goal 1 (Week 1): Pt will complete toilet transfer using LRAD min A OT Short Term Goal 2 (Week 1): Pt will complete LB dressing min A OT Short Term Goal 3 (Week 1): Pt will utilize RUE during functional tasks ~75% with supervision OT Short Term Goal 4 (Week 1): Pt will complete sit>stand for BADLs min A using LRAD  Skilled Therapeutic Interventions/Progress Updates:     Pt received sitting up in bed in good spirits and receptive to skilled OT session. Pt presenting increasingly tired today. Difficult to assess Pt current status d/t expressive aphasia. Pt able to communicate more automatic phrases easily stating "I am tired" and "I am doing okay". Pt transitioned to EOB with increased time and mod A to lift trunk. Pt fatigued following supine>sit and provided increased time seated EOB to rest prior to ambulation. Pt requested to use restroom at beginning of session. Pt sit>stand initially max A with mod cues required for technique and hand placement. Pt heavily leaning on arms in standing. Pt instructed to complete marches in place in preparation for ambulation. Able to complete 6 marches with cueing. Pt began ambulating to bathroom using RW heavy min A transitioning to heavy mod A. Pt returned stand pivot back to bed for safety. Pt provided increased time to rest with decrease in fatigue noted- Pt presenting more alert. BP taken in sitting 155/55 (84) HR 50 PSO2 96%. Messaged Rn to discuss Pt BP with Rn stating measurement is within normal range for Pt.  Squat pivot to wc using RW mod A to increase Pt safety d/t fatigue. BP reassessed in sitting on L arm 141/54 (81) HR 53 PSO2 98%. Pt transported to bathroom. Squat pivot to toilet without  AD heavy mod. Pt heavily leaning towards R upon sitting on toilet requiring mod multimodal cues for Pt to correct posture and body alignment. Pt provided increased time on toilet (continent BM and incontinent void in brief). Pt max A for peri care and clothing management. Pt unable to stand for toileting and transfer requiring use of a steady. Pt able to pull self to standing with heavy min A in steady and follow verbal cues for safety. Pt transported to wc total A. Pt doffed gown and donned clean shirt supervision. Pt transported to sink total A in wc. Pt completed seated grooming and hygiene tasks- brushing teeth, combing hair, and washing face with no cues required for attention or sequencing. Pt was left resting in her wc with call bell in reach, seat belt alarm on, and all needs met. Pt daughter entering room at end of session. DTR provided update on pt progress/status with all relevant questions answered. Nursing staff contacted to assist Pt back to bed following rest break.   Therapy Documentation Precautions:  Precautions Precautions: Fall, Other (comment) Precaution Comments: expressive aphasia Restrictions Weight Bearing Restrictions: No General:   Vital Signs: Therapy Vitals Temp: 97.8 F (36.6 C) Temp Source: Oral Pulse Rate: (Abnormal) 51 Resp: 18 BP: (Abnormal) 157/77 Patient Position (if appropriate): Sitting Oxygen Therapy SpO2: 95 % O2 Device: Room Air Pain:   ADL: ADL Eating: Minimal assistance Where Assessed-Eating: Edge of bed Grooming: Minimal assistance Where Assessed-Grooming: Wheelchair Upper Body Bathing: Minimal assistance Where Assessed-Upper Body Bathing: Shower  Lower Body Bathing: Moderate assistance Where Assessed-Lower Body Bathing: Shower Upper Body Dressing: Minimal assistance Where Assessed-Upper Body Dressing: Wheelchair Lower Body Dressing: Moderate assistance Where Assessed-Lower Body Dressing: Wheelchair Toileting: Moderate  assistance Where Assessed-Toileting: Glass blower/designer: Moderate assistance Toilet Transfer Method: Counselling psychologist: Grab bars, Raised toilet seat Tub/Shower Transfer: Not assessed Social research officer, government: Moderate assistance Social research officer, government Method: Print production planner with back Research officer, political party   Exercises:   Other Treatments:     Therapy/Group: Individual Therapy  Janey Genta 06/17/2022, 9:49 AM

## 2022-06-17 NOTE — Progress Notes (Signed)
PROGRESS NOTE   Subjective/Complaints:   Remains aphasic   ROS-limited by aphasia   Objective:   No results found. No results for input(s): "WBC", "HGB", "HCT", "PLT" in the last 72 hours.  No results for input(s): "NA", "K", "CL", "CO2", "GLUCOSE", "BUN", "CREATININE", "CALCIUM" in the last 72 hours.   Intake/Output Summary (Last 24 hours) at 06/17/2022 0827 Last data filed at 06/17/2022 0710 Gross per 24 hour  Intake 717 ml  Output --  Net 717 ml         Physical Exam: Vital Signs Blood pressure (!) 157/77, pulse (!) 51, temperature 97.8 F (36.6 C), temperature source Oral, resp. rate 18, height 5\' 3"  (1.6 m), weight 80.6 kg, SpO2 95 %.   General: No acute distress Mood and affect are appropriate Heart: Bradycardic Lungs: Clear to auscultation, breathing unlabored, no rales or wheezes Abdomen: Positive bowel sounds, soft nontender to palpation, nondistended Extremities: No clubbing, cyanosis, or edema Skin: No evidence of breakdown, no evidence of rash Neurologic: alert , follows simple commands, fluent aphasia Cranial nerves II through XII intact, motor strength is 5/5 in left 4/5 right  deltoid, bicep, tricep, grip, hip flexor, knee extensors, ankle dorsiflexor and plantar flexor Sensory exam cannot assess aphasic  Cerebellar exam normal finger to nose to finger ain UE Musculoskeletal: Full range of motion in all 4 extremities. No joint swelling. Right sided lean  Assessment/Plan: 1. Functional deficits which require 3+ hours per day of interdisciplinary therapy in a comprehensive inpatient rehab setting. Physiatrist is providing close team supervision and 24 hour management of active medical problems listed below. Physiatrist and rehab team continue to assess barriers to discharge/monitor patient progress toward functional and medical goals  Care Tool:  Bathing    Body parts bathed by patient: Right  arm, Left arm, Chest, Abdomen, Front perineal area, Right lower leg, Right upper leg, Face         Bathing assist Assist Level: Moderate Assistance - Patient 50 - 74%     Upper Body Dressing/Undressing Upper body dressing   What is the patient wearing?: Pull over shirt    Upper body assist Assist Level: Minimal Assistance - Patient > 75%    Lower Body Dressing/Undressing Lower body dressing      What is the patient wearing?: Pants     Lower body assist Assist for lower body dressing: Maximal Assistance - Patient 25 - 49%     Toileting Toileting    Toileting assist Assist for toileting: Maximal Assistance - Patient 25 - 49%     Transfers Chair/bed transfer  Transfers assist     Chair/bed transfer assist level: Moderate Assistance - Patient 50 - 74% Chair/bed transfer assistive device: Armrests, Programmer, multimedia   Ambulation assist      Assist level: Moderate Assistance - Patient 50 - 74% Assistive device: Parallel bars Max distance: 15   Walk 10 feet activity   Assist     Assist level: Moderate Assistance - Patient - 50 - 74% Assistive device: Parallel bars   Walk 50 feet activity   Assist Walk 50 feet with 2 turns activity did not occur: Safety/medical concerns  Walk 150 feet activity   Assist Walk 150 feet activity did not occur: Safety/medical concerns         Walk 10 feet on uneven surface  activity   Assist Walk 10 feet on uneven surfaces activity did not occur: Safety/medical concerns         Wheelchair     Assist Is the patient using a wheelchair?: Yes Type of Wheelchair: Manual    Wheelchair assist level: Moderate Assistance - Patient 50 - 74% Max wheelchair distance: 100    Wheelchair 50 feet with 2 turns activity    Assist        Assist Level: Moderate Assistance - Patient 50 - 74%   Wheelchair 150 feet activity     Assist      Assist Level: Maximal Assistance -  Patient 25 - 49%   Blood pressure (!) 157/77, pulse (!) 51, temperature 97.8 F (36.6 C), temperature source Oral, resp. rate 18, height 5\' 3"  (1.6 m), weight 80.6 kg, SpO2 95 %.  Medical Problem List and Plan: 1. Functional deficits secondary to bilateral left greater than right acute scattered punctate infarct due to left ICA and MCA occlusion status post revascularization as well as history of ischemic cerebrovascular accident December 3154- embolic stroke DOAC failure              -patient may  shower             -ELOS/Goals: 14-16 days, min assist to sup goals with PT, OT, min assist with SLP- Team conference today please see physician documentation under team conference tab, met with team  to discuss problems,progress, and goals. Formulized individual treatment plan based on medical history, underlying problem and comorbidities.   Continue CIR  CT Head ordered given worsening right sided weakness, no new infarcts and strength seemed to improve later in the day 2.  Antithrombotics: -DVT/anticoagulation: Eliquis             -antiplatelet therapy: N/A 3. Pain Management: Tylenol as needed 4. Mood/Behavior/Sleep: Provide emotional support             -antipsychotic agents: N/A 5. Neuropsych/cognition: This patient is not capable of making decisions on her own behalf. 6. Skin/Wound Care: Routine skin checks 7. Fluids/Electrolytes/Nutrition: Routine in and outs with follow-up chemistries 8.  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  Follow-up outpatient cardiology.  Continue amiodarone 100 mg daily 9.  CKD.  Baseline creatinine 1.20-1.30.  Follow-up chemistries on admit. 10.  Diastolic congestive heart failure.  Monitor for any signs of fluid overload.  Continue Entresto 24-26 mg twice daily             -daily weights, messaged RN to weigh patient on 1/6 11.  Hypothyroidism.  continue Synthroid 12.  Hyperlipidemia.  continue Lipitor 13.  Constipation.  continue MiraLAX daily, Colace  100 mg twice daily 14. Bradycardia: continue to monitor HR TID. F/u with cardiology regarding amiodarone dosing if symptomatic   15.  RSV- on droplet prec, no cough , SOB , or resp distress noted on exam today.  Has been afebrile, no cough thus far this am.  Will cont supportive care.  Droplet precautions- may be able to d/c precautions ~1/15 LOS: 6 days A FACE TO FACE EVALUATION WAS PERFORMED  Charlett Blake 06/17/2022, 8:27 AM

## 2022-06-17 NOTE — Progress Notes (Signed)
Speech Language Pathology Weekly Progress and Session Note  Patient Details  Name: Rebekah Pope MRN: [id number] Date of Birth: 1941/04/26  Beginning of progress report period: June 12, 2022 End of progress report period: June 18, 2022  Today's Date: 06/18/2022 SLP Individual Time: 1345-1445 SLP Individual Time Calculation (min): 60 min  Short Term Goals: Week 1: SLP Short Term Goal 1 (Week 1): Patient will name common objects with 50% accuracy given mod A verbal/visual cues SLP Short Term Goal 1 - Progress (Week 1): Met SLP Short Term Goal 2 (Week 1): Patient will utilize multimodal communication means (e.g., picture based communication board) to communicate functional needs during 3/5 opportunities throughout session with mod A verbal/visual cues for effectiveness SLP Short Term Goal 2 - Progress (Week 1): Met SLP Short Term Goal 3 (Week 1): Patient will complete automatic speech tasks with 50% accuracy given mod A verbal and visual cues SLP Short Term Goal 3 - Progress (Week 1): Met SLP Short Term Goal 4 (Week 1): Patient will follow 1-step commands with max A multimodal cues to achieve 40% accuracy SLP Short Term Goal 4 - Progress (Week 1): Met SLP Short Term Goal 5 (Week 1): Patient will respond to semi-complex yes/no questions with mod A verbal/visual cues to achieve 50% accuracy SLP Short Term Goal 5 - Progress (Week 1): Met  New Short Term Goals: Week 2: SLP Short Term Goal 1 (Week 2): Patient will name common objects with 50% accuracy given min A verbal/visual cues SLP Short Term Goal 2 (Week 2): Patient will utilize multimodal communication means (e.g., picture based communication board) to communicate functional needs during 3/5 opportunities throughout session with min A verbal/visual cues for effectiveness SLP Short Term Goal 3 (Week 2): Patient will follow 1-step commands during functional tasks with min A verbal cues to achieve 80% accuracy SLP Short Term Goal 4  (Week 2): Patient will respond to semi-complex yes/no questions with min A verbal/visual cues to achieve 75% accuracy SLP Short Term Goal 5 (Week 2): Pt will demonstrate awareness and attempt to repair verbal errors during at least 3 occasions throughout session with mod A verbal cues  Weekly Progress Updates: Patient has made excellent gains and has met 5 of 5 STGs this reporting period. Pt is currently verbally expressing functional needs/wants/wishes with mod-to-max A verbal and visual cues, and through multimodal means with mod A verbal/visual cues. Pt is comprehending basic-to-semi complex information and following simple commands with min A verbal cues and extended processing time. Pt's spontaneous verbalizations have been improving and pt continues to demonstrate stimulability to SLP interventions. Patient and family education is ongoing. Patient would benefit from continued skilled SLP intervention to maximize functional communication prior to discharge.   Intensity: Minumum of 1-2 x/day, 30 to 90 minutes Frequency: 3 to 5 out of 7 days Duration/Length of Stay: 07/28/2022 Treatment/Interventions: Multimodal communication approach;Speech/Language facilitation;Cueing hierarchy;Functional tasks;Patient/family education;Therapeutic Activities  Daily Session Skilled Therapeutic Interventions: Skilled ST treatment focused on language goals. Pt was greeted upright in her wheelchair and accompanied by her daughter. Pt with improved spontaneous verbalizations at the phrase and short-sentence level, despite continued instances of neologisms, phonemic, and semantic paraphasias impacting verbal effectiveness. Errors are occurring with less significance and frequency.  Naming: -Colors: 100% accuracy at independent level. -Items within the home: 2/10 independent of cues progressing to 6/10 with min A verbal cues (phonemic), progressing to 9/10 with mod A verbal cues (phonemic + sentence completion).  -Weather:  3/6 with min A phonemic cues  progressing, to 6/6 with mod A phonemic + field of 2 choices.  - Found within the community: 1/7 independent of cues, progressing to 4/7 with min A phonemic cues, progressing to 6/7 with mod A cues (phonemic and sentence completion). General objects: 1/8 independent of cues, progressing to 6/8 with mod A verbal cues (phonemic and sentence completion), progressing to 8/8 with max A verbal + visual/written cues.   Automatic speech:  - Common sayings: 2/12 with min A verbal cues progressing to 6/12 with max A multimodal cues. Significant neologistic errors noted.  -"Of" phrases (e.g., "a bar of __"): 6/7 with min A verbal cues.   Patient was left in wheelchair with alarm activated and immediate needs within reach at end of session. Continue per current plan of care.       General    Pain  None/denied  Therapy/Group: Individual Therapy  Patty Sermons 06/18/2022, 5:05 PM

## 2022-06-18 ENCOUNTER — Telehealth: Payer: Self-pay | Admitting: Internal Medicine

## 2022-06-18 ENCOUNTER — Telehealth: Payer: Self-pay | Admitting: Cardiology

## 2022-06-18 DIAGNOSIS — I63512 Cerebral infarction due to unspecified occlusion or stenosis of left middle cerebral artery: Secondary | ICD-10-CM | POA: Diagnosis not present

## 2022-06-18 LAB — URINALYSIS, ROUTINE W REFLEX MICROSCOPIC
Bilirubin Urine: NEGATIVE
Glucose, UA: NEGATIVE mg/dL
Hgb urine dipstick: NEGATIVE
Ketones, ur: NEGATIVE mg/dL
Nitrite: NEGATIVE
Protein, ur: NEGATIVE mg/dL
Specific Gravity, Urine: 1.008 (ref 1.005–1.030)
WBC, UA: >50 WBC/hpf — ABNORMAL HIGH (ref 0–5)
pH: 5 (ref 5.0–8.0)

## 2022-06-18 MED ORDER — DICLOFENAC SODIUM 1 % EX GEL
2.0000 g | Freq: Four times a day (QID) | CUTANEOUS | Status: DC
  Administered 2022-06-18 – 2022-06-25 (×13): 2 g via TOPICAL
  Filled 2022-06-18 (×2): qty 100

## 2022-06-18 NOTE — Progress Notes (Incomplete)
Occupational Therapy Session Note  Patient Details  Name: Rebekah Pope MRN: [id number] Date of Birth: 1940/09/25  {CHL IP REHAB OT TIME CALCULATIONS:[id number]}   Short Term Goals: Week 2:     Skilled Therapeutic Interventions/Progress Updates:     Missed 10 minutesw skilled OT treatment d/t nursing care. Pt received supine in bed with nursing staff present finishing providing care to Pt. Rn in/out during session for medication management. Pt presenting to be mildly anxious, but receptive to skilled OT session. Pt presenting to be in pain, unrated in R knee with Rn providing pain medication- OT offered intermittent rest breaks and change in position for pain management.  Bed mobility practice with Pt educated on log rolling technique. Pt completing log roll to EOB with min A to lift trunk +time and mod verbal cues.  Therapy Documentation Precautions:  Precautions Precautions: Fall, Other (comment) Precaution Comments: expressive aphasia Restrictions Weight Bearing Restrictions: No General:   Vital Signs:  Pain:   ADL: ADL Eating: Minimal assistance Where Assessed-Eating: Edge of bed Grooming: Minimal assistance Where Assessed-Grooming: Wheelchair Upper Body Bathing: Minimal assistance Where Assessed-Upper Body Bathing: Shower Lower Body Bathing: Moderate assistance Where Assessed-Lower Body Bathing: Shower Upper Body Dressing: Minimal assistance Where Assessed-Upper Body Dressing: Wheelchair Lower Body Dressing: Moderate assistance Where Assessed-Lower Body Dressing: Wheelchair Toileting: Moderate assistance Where Assessed-Toileting: Glass blower/designer: Moderate assistance Toilet Transfer Method: Counselling psychologist: Grab bars, Raised toilet seat Tub/Shower Transfer: Not assessed Social research officer, government: Moderate assistance Social research officer, government Method: Print production planner with back Technical brewer   Exercises:   Other Treatments:     Therapy/Group: Individual Therapy  Janey Genta 06/18/2022, 8:11 AM

## 2022-06-18 NOTE — Progress Notes (Signed)
Physical Therapy Session Note  Patient Details  Name: Rebekah Pope MRN: [id number] Date of Birth: 10/15/1940  Today's Date: 06/18/2022 PT Individual Time: 1015-1110 PT Individual Time Calculation (min): 55 min   Short Term Goals: Week 1:  PT Short Term Goal 1 (Week 1): Pt will perform bed mobiltiy with min assist PT Short Term Goal 2 (Week 1): Pt will trasnfer to [facility] with min assist and LRAD PT Short Term Goal 3 (Week 1): Pt will ambulate 33ft with min assist and LRAD PT Short Term Goal 4 (Week 1): Pt will propell WC 135ft with min assist  Skilled Therapeutic Interventions/Progress Updates:    Pt received supine in bed, asleep but easily awakens and agreeable to therapy session. Supine>sitting L EOB, HOB partially elevated and using bedrail, with supervision and increased time/effort. Sitting EOB threaded pants on mod/max assist. Sit>stand EOB>RW with heavy min assist for lifting to stand - again question cuing needed to remember to push up from seat - standing with CGA while pt pulled pants up over hips min assist.  Short distance ~93ft ambulatory transfer to w/c using RW with CGA for steadying and cuing to turn fully and keep RW close prior to sitting.    Transported to/from gym in w/c for time management and energy conservation.  Gait training 183ft using RW with CGA for safety - improved upright trunk posture today, improved R LE foot clearance and step length achieving consistent reciprocal stepping pattern but towards end with fatigue R gait impairments become present again - slow gait speed.   Participated in Timed Up and Go (TUG) using RW and pt requiring min assist to come to standing from sitting as well as CGA for safety during gait: 1st trial: 60 seconds 2nd trial: 50 seconds  Patient demonstrates high fall risk as indicated by requiring >13.5seconds to complete the TUG.   Gait training ~39ft to EOM using RW with continued CGA but pt experiencing increasing R knee pain  during this gait trial having more forward flexed posture with increased B UE WBing through AD to off-weight R LE. Despite aphasia, pt expressing increased R knee pain at this time.  Seated EOM R LE knee flexion/extension AROM x20 reps with attempt to perform long arc quads wearing 4lb ankle weight for increased quad muscle activation, but pt started to report anterior shin pain during weighted knee extension so discontinued due to aphasia limiting therapist's understanding of the pain pt was experiencing.  Repeated sit<>stands to/from elevated EOM<>RW 2x5 reps with light min assist - pt states "I can feel the pain, but can work with it" (clearly articulating that full phrase) - cuing to increase anterior trunk flexion followed by increased glute activation to rise into standing.  Gait training ~123ft using RW with continued CGA for steadying/safety with pt having decreased R foot clearance during swing, sliding foot forward, continues with decreased gait speed, and short though min reciprocal steps - cuing throughout for improvement.   Transported back to room and pt left seated in w/c with needs in reach and seat belt alarm on.  Therapy Documentation Precautions:  Precautions Precautions: Fall, Other (comment) Precaution Comments: expressive aphasia Restrictions Weight Bearing Restrictions: No   Pain:  Reports R knee pain is not as bad as earlier/yesterday and this was apparent in her mobility, until after ~4 sit>stands when pt had sudden onset increase in R knee pain when rising up to standing - modified interventions and provided seated rest breaks for pain management.  Therapy/Group: Individual Therapy  Tawana Scale , PT, DPT, NCS, CSRS 06/18/2022, 7:53 AM

## 2022-06-18 NOTE — Telephone Encounter (Signed)
Patient's daughter Rebekah Pope called she would like to speak to Dr. Bettina Gavia, she states her mother is in the hospital for a stroke. They are talking about a Psychologist, forensic.  She said it's very important she speak with Dr. Bettina Gavia about this.

## 2022-06-18 NOTE — Telephone Encounter (Signed)
Pt daughter called in stating she needs to speak with EP doctor about when her mother will have pacemaker implant. She requested to speak to Dr. Lovena Le because she spoke with him before. Informed her both doctors are out of the office right now. Will forward to RN.

## 2022-06-18 NOTE — Progress Notes (Signed)
PROGRESS NOTE   Subjective/Complaints:  Appreciate EP note,  Remains aphasic   ROS-limited by aphasia   Objective:   LONG TERM MONITOR-LIVE TELEMETRY (3-14 DAYS)  Result Date: 06/17/2022 Patch Wear Time:  14 days and 0 hours (2023-12-14T13:57:12-498 to 2023-12-28T13:57:12-498) Patient had a min HR of 33 bpm, max HR of 138 bpm, and avg HR of 48 bpm. Predominant underlying rhythm was Sinus Rhythm. First Degree AV Block was present. Bundle Branch Block/IVCD was present. Chronotropic incompetence is seen with peak systolic heart rate of only 70 bpm No episodes of sinus arrest or second or third-degree AV block There are no symptom episodes There were no episodes of atrial fibrillation or flutter 14 Supraventricular Tachycardia runs occurred, the run with the fastest interval lasting 7 beats with a max rate of 138 bpm, the longest lasting 12 beats with an avg rate of 92 bpm. Isolated SVEs were rare (<1.0%), SVE Couplets were rare (<1.0%), and SVE Triplets were rare (<1.0%). Isolated VEs were occasional (2.1%, 17338), VE Couplets were rare (<1.0%, 1194), and no VE Triplets were present. Ventricular Bigeminy and Trigeminy were present.   No results for input(s): "WBC", "HGB", "HCT", "PLT" in the last 72 hours.  No results for input(s): "NA", "K", "CL", "CO2", "GLUCOSE", "BUN", "CREATININE", "CALCIUM" in the last 72 hours.   Intake/Output Summary (Last 24 hours) at 06/18/2022 0745 Last data filed at 06/17/2022 1854 Gross per 24 hour  Intake 238 ml  Output --  Net 238 ml         Physical Exam: Vital Signs Blood pressure (!) 172/60, pulse (!) 50, temperature 98 F (36.7 C), temperature source Oral, resp. rate 16, height 5\' 3"  (1.6 m), weight 81.6 kg, SpO2 94 %.   General: No acute distress Mood and affect are appropriate Heart: Bradycardic Lungs: Clear to auscultation, breathing unlabored, no rales or wheezes Abdomen: Positive  bowel sounds, soft nontender to palpation, nondistended Extremities: No clubbing, cyanosis, or edema Skin: No evidence of breakdown, no evidence of rash Neurologic: alert , follows simple commands, fluent aphasia Cranial nerves II through XII intact, motor strength is 5/5 in left 4/5 right  deltoid, bicep, tricep, grip, hip flexor, knee extensors, ankle dorsiflexor and plantar flexor Sensory exam cannot assess aphasic  Cerebellar exam normal finger to nose to finger ain UE Musculoskeletal: Full range of motion in all 4 extremities. No joint swelling. Right sided lean  Assessment/Plan: 1. Functional deficits which require 3+ hours per day of interdisciplinary therapy in a comprehensive inpatient rehab setting. Physiatrist is providing close team supervision and 24 hour management of active medical problems listed below. Physiatrist and rehab team continue to assess barriers to discharge/monitor patient progress toward functional and medical goals  Care Tool:  Bathing    Body parts bathed by patient: Right arm, Left arm, Chest, Abdomen, Front perineal area, Right lower leg, Right upper leg, Face         Bathing assist Assist Level: Moderate Assistance - Patient 50 - 74%     Upper Body Dressing/Undressing Upper body dressing   What is the patient wearing?: Pull over shirt    Upper body assist Assist Level: Minimal Assistance - Patient >  75%    Lower Body Dressing/Undressing Lower body dressing      What is the patient wearing?: Pants     Lower body assist Assist for lower body dressing: Maximal Assistance - Patient 25 - 49%     Toileting Toileting    Toileting assist Assist for toileting: Maximal Assistance - Patient 25 - 49%     Transfers Chair/bed transfer  Transfers assist     Chair/bed transfer assist level: Moderate Assistance - Patient 50 - 74% Chair/bed transfer assistive device: Armrests, Programmer, multimedia   Ambulation assist       Assist level: Moderate Assistance - Patient 50 - 74% Assistive device: Parallel bars Max distance: 15   Walk 10 feet activity   Assist     Assist level: Moderate Assistance - Patient - 50 - 74% Assistive device: Parallel bars   Walk 50 feet activity   Assist Walk 50 feet with 2 turns activity did not occur: Safety/medical concerns         Walk 150 feet activity   Assist Walk 150 feet activity did not occur: Safety/medical concerns         Walk 10 feet on uneven surface  activity   Assist Walk 10 feet on uneven surfaces activity did not occur: Safety/medical concerns         Wheelchair     Assist Is the patient using a wheelchair?: Yes Type of Wheelchair: Manual    Wheelchair assist level: Moderate Assistance - Patient 50 - 74% Max wheelchair distance: 100    Wheelchair 50 feet with 2 turns activity    Assist        Assist Level: Moderate Assistance - Patient 50 - 74%   Wheelchair 150 feet activity     Assist      Assist Level: Maximal Assistance - Patient 25 - 49%   Blood pressure (!) 172/60, pulse (!) 50, temperature 98 F (36.7 C), temperature source Oral, resp. rate 16, height 5\' 3"  (1.6 m), weight 81.6 kg, SpO2 94 %.  Medical Problem List and Plan: 1. 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  Antithrombotics: -DVT/anticoagulation: Eliquis             -antiplatelet therapy: N/A 3. Pain Management: Tylenol as needed 4. Mood/Behavior/Sleep: Provide emotional support             -antipsychotic agents: N/A 5. Neuropsych/cognition: This patient is not capable of making  decisions on her own behalf. 6. Skin/Wound Care: Routine skin checks 7. Fluids/Electrolytes/Nutrition: Routine in and outs with follow-up chemistries 8.  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  Follow-up outpatient cardiology.  Continue amiodarone 100 mg daily  No runs of Afib on monitor , no sinus arrest , has 1st deg heart block but no 2nd or 3rd degree 9.  CKD.  Baseline creatinine 1.20-1.30.  Follow-up chemistries on admit. 10.  Diastolic congestive heart failure.  Monitor for any signs of fluid overload.  Continue Entresto 24-26 mg twice daily             -  daily weights, messaged RN to weigh patient on 1/6 11.  Hypothyroidism.  continue Synthroid 12.  Hyperlipidemia.  continue Lipitor 13.  Constipation.  continue MiraLAX daily, Colace 100 mg twice daily 14. Bradycardia: continue to monitor HR TID. F/u with cardiology regarding amiodarone dosing if symptomatic   15.  RSV- on droplet prec, no cough , SOB , or resp distress noted on exam today.  Has been afebrile, no cough thus far this am.  Will cont supportive care.  Droplet precautions- may be able to d/c precautions ~1/15 LOS: 7 days A FACE TO FACE EVALUATION WAS PERFORMED  Erick Colace 06/18/2022, 7:45 AM

## 2022-06-18 NOTE — Progress Notes (Signed)
Occupational Therapy Session Note  Patient Details  Name: Rebekah Pope MRN: [id number] Date of Birth: 1941/01/10  Today's Date: 06/18/2022 OT Individual Time: 0810-0900 OT Individual Time Calculation (min): 50 min  OT Individual Time: 6270-3500 OT Individual Time Calculation (min): 30 min   Short Term Goals: Week 1:  OT Short Term Goal 1 (Week 1): Pt will complete toilet transfer using LRAD min A OT Short Term Goal 2 (Week 1): Pt will complete LB dressing min A OT Short Term Goal 3 (Week 1): Pt will utilize RUE during functional tasks ~75% with supervision OT Short Term Goal 4 (Week 1): Pt will complete sit>stand for BADLs min A using LRAD  Skilled Therapeutic Interventions/Progress Updates:     Missed 10 minutesw skilled OT treatment d/t nursing care. Pt on droplet precautions- OT donned proper PPE prior to entering. Pt received supine in bed with nursing staff present finishing providing care to Pt. Rn in/out during session for medication management. Pt presenting to be mildly anxious, but receptive to skilled OT session. Pt presenting to be in pain, unrated in R knee with Rn providing pain medication- OT offered intermittent rest breaks and change in position for pain management.   Bed mobility practice with Pt educated on log rolling technique. Pt completing log roll to EOB with bed flat to simulate home environment with min A to lift trunk +time and mod verbal cues.   AAROM of R/L LEs performed with Pt EOB to decrease pain in preparation for ambulation. Pt agreeable to shower. Pt provided white board and able to write "I really don't his shampoo his today" with OT clarifying "you don't need to wash your hair today?". Pt sit>stand using RW mod A with cues for body mechanics and hand placement using RW. Pt ambulated to bathroom using RW min A ++time d/t decreased step length on R foot with cueing provided for body alignment, safety, and step length. Pt educated on shower transfer  technique with Pt completing with mod A for RW management and problem solving. Pt able to complete U/LB bathing while seated for entirety of shower at close supervision and mod cues to utilize RUE. Transferred from tub bench using grab bars to pull self to standing min A. Transferred from shower and ambulated to room min A using RW.   Pt donned shirt seated in wc and completed grooming/hygiene tasks seated at sink with supervision with Pt utilizing RUE during task with min cueing.   Squat pivot back to bed to prevent increase in knee pain during full stand mod A with mod cueing for body mechanics and technique. Pt was left resting in bed with call bell in reach, bed alarm on, and all needs met.   PM Session:   Pt received resting in wc with DTR present in room. Pt presenting to be in good spirits reporting 0/10 pain at beginning of session. Pt and DTR greeted. Dtr inquiring about Pt progress with update provided. Requested for Dtr to bring in a change of clothes for Pt for every day with Dtr in agreement. Pt politely denying need for BADLs this session.  Pt transported total A to therapy gym in wc for time management. Pt educated on purpose of nine-hole peg test (NHPT) as an assessment of finger dexterity. Pt completed single practice round with dominant L hand first and then R hand per assessment directions. Pt nodded in agreement of understanding directions. Pt completed assessment seated at table starting with dominant L hand and then  R hand x4. Average time to complete the test in seconds:  Dominant L hand: 34 seconds Non-dominant R hand: 61 seconds For healthy female adults, the NHPT was completed in 17.9 seconds on R hand and 19.6 seconds in L hand. This assessment will be used to track Pt progress during LOS.   Pt requested to walk back to room. Pt completed sit>stand x2 using RW mod A and unable to tolerate standing d/t pain. Pt transported back to room total A in wc to prevent increased pain.  Completed LB AAROM in attempt to decrease pain with mild improvement noted. Pt was left resting in wc with call bell in reach, seat belt alarm on, and all needs met.   Therapy Documentation Precautions:  Precautions Precautions: Fall, Other (comment) Precaution Comments: expressive aphasia Restrictions Weight Bearing Restrictions: No General: General OT Amount of Missed Time: 10 Minutes Vital Signs:  Pain: Pain Assessment Pain Scale: 0-10 Pain Score: 4  Faces Pain Scale: Hurts a little bit Pain Location: Leg Pain Orientation: Right;Left Pain Descriptors / Indicators: Aching Pain Frequency: Intermittent Pain Onset: On-going Pain Intervention(s): Medication (See eMAR) Multiple Pain Sites: No ADL: ADL Eating: Minimal assistance Where Assessed-Eating: Edge of bed Grooming: Minimal assistance Where Assessed-Grooming: Wheelchair Upper Body Bathing: Minimal assistance Where Assessed-Upper Body Bathing: Shower Lower Body Bathing: Moderate assistance Where Assessed-Lower Body Bathing: Shower Upper Body Dressing: Minimal assistance Where Assessed-Upper Body Dressing: Wheelchair Lower Body Dressing: Moderate assistance Where Assessed-Lower Body Dressing: Wheelchair Toileting: Moderate assistance Where Assessed-Toileting: Glass blower/designer: Moderate assistance Toilet Transfer Method: Counselling psychologist: Grab bars, Raised toilet seat Tub/Shower Transfer: Not assessed Social research officer, government: Moderate assistance Social research officer, government Method: Heritage manager: Shower seat with back   Therapy/Group: Individual Therapy  Janey Genta 06/18/2022, 12:25 PM

## 2022-06-18 NOTE — Progress Notes (Signed)
Physical Therapy Session Note  Patient Details  Name: Rebekah Pope MRN: [id number] Date of Birth: 18-Mar-1941  Today's Date: 06/18/2022 PT Individual Time: 1305-1330 PT Individual Time Calculation (min): 25 min   Short Term Goals: Week 1:  PT Short Term Goal 1 (Week 1): Pt will perform bed mobiltiy with min assist PT Short Term Goal 2 (Week 1): Pt will trasnfer to [facility] with min assist and LRAD PT Short Term Goal 3 (Week 1): Pt will ambulate 44ft with min assist and LRAD PT Short Term Goal 4 (Week 1): Pt will propell WC 147ft with min assist  Skilled Therapeutic Interventions/Progress Updates:    Pt's daughter, Rebekah Pope, with many questions regarding pt's CLOF and D/C planning; therefore, therapist adjusted schedule to treat patient at this time. Pt received sitting in w/c and agreeable to therapy session focusing on education with her and her daughter.  Therapist discussed the following with pt and daughter, Rebekah Pope: Rebekah Pope confirms that pt will have a ramped entrance in to the home with pt's bed/bath on main level - Rebekah Pope confirms that she is planning to provide pt 24hr support upon D/C for at least 2 weeks following D/C and that she is considering option of hiring caregivers to assist after that - she expresses concern regarding pt's ability to perform bed mobility - educated on pt's CLOF performing supine>sit with supervision HOB partially elevated and using bedrail - Rebekah Pope inquires about bed with adjustable base and bedrail, will continue to assess pt's need for this as she progresses, but discussed that could be a good option and pt's daughter likes it - educated pt currently requires min assist to lift up into standing from sitting to RW - educated on pt's mobility level during earlier PT session and decreased R knee pain with medication administration prior to session  - pt's daughter requesting to move D/C date back to 1/24 due to her needing to go back to [place] and cannot return for  hands-on education/training until 2022/07/07, made entire team aware of this request to allow further discussion with the daughter - discussed follow-up therapy options of HHPT and OPPT with plan to discuss this further closer to D/C based on pt's LOF at that time  Pt left seated in w/c with needs in reach, seat belt alarm on, and daughter present.   Therapy Documentation Precautions:  Precautions Precautions: Fall, Other (comment) Precaution Comments: expressive aphasia Restrictions Weight Bearing Restrictions: No  Pain: No reports of pain throughout session.    Therapy/Group: Individual Therapy  Tawana Scale , PT, DPT, NCS, CSRS 06/18/2022, 1:29 PM

## 2022-06-18 NOTE — Telephone Encounter (Signed)
Patient being followed by hospital team.  Appears patient is in rehab.  Last cardiology note is regarding monitor results.

## 2022-06-18 NOTE — Telephone Encounter (Signed)
We will follow up in the office once she has completed rehab and discharged.  There is no urgency for PPM.  ([place] can you please schedule her to see Dr. Caryl Comes in 3 weeks?)   Renee

## 2022-06-18 NOTE — Telephone Encounter (Signed)
I spoke with patient's daughter and she is currently at [facility] with her mother.  I gave her information from [facility]. Patient's daughter requests to speak with Joseph Art or EP provider regarding pacemaker.

## 2022-06-19 DIAGNOSIS — I63512 Cerebral infarction due to unspecified occlusion or stenosis of left middle cerebral artery: Secondary | ICD-10-CM | POA: Diagnosis not present

## 2022-06-19 MED ORDER — LEVOTHYROXINE SODIUM 112 MCG PO TABS
112.0000 ug | ORAL_TABLET | Freq: Every day | ORAL | Status: DC
  Administered 2022-06-20 – 2022-06-25 (×6): 112 ug via ORAL
  Filled 2022-06-19 (×7): qty 1

## 2022-06-19 MED ORDER — ACETAMINOPHEN 650 MG RE SUPP: 650.0000 mg | RECTAL | Status: DC | PRN

## 2022-06-19 MED ORDER — ACETAMINOPHEN 325 MG PO TABS
650.0000 mg | ORAL_TABLET | ORAL | Status: DC | PRN
  Administered 2022-06-19 – 2022-06-24 (×6): 650 mg via ORAL
  Filled 2022-06-19 (×6): qty 2

## 2022-06-19 MED ORDER — ACETAMINOPHEN 160 MG/5ML PO SOLN: 650.0000 mg | ORAL | Status: DC | PRN

## 2022-06-19 NOTE — Telephone Encounter (Signed)
Rebekah Pope's daughter requested I call her to discuss pacemaker She is from her hometown in [place] and we have mutual acquaintances I told her that immediately poststroke in general we try to avoid elective procedures and anything that could disrupt blood pressure cerebrovascular flow and can potentiate neurologic injury I saw her yesterday that she is scheduled follow-up with Dr. Caryl Comes she is Being reassured by my telephone call I reinforced with her the pacemaker insertion and the mother's case I think more is a quality-of-life issue than a medical emergency and life-sustaining She will be returning to [place] and coming back we will accompany her mother to her follow-up with the EP.

## 2022-06-19 NOTE — Progress Notes (Signed)
Occupational Therapy Session Note  Patient Details  Name: Rebekah Pope MRN: [id number] Date of Birth: February 07, 1941  Today's Date: 06/19/2022 OT Individual Time: 1300-1400 OT Individual Time Calculation (min): 60 min    Short Term Goals: Week 1:  OT Short Term Goal 1 (Week 1): Pt will complete toilet transfer using LRAD min A OT Short Term Goal 2 (Week 1): Pt will complete LB dressing min A OT Short Term Goal 3 (Week 1): Pt will utilize RUE during functional tasks ~75% with supervision OT Short Term Goal 4 (Week 1): Pt will complete sit>stand for BADLs min A using LRAD Week 2:     Skilled Therapeutic Interventions/Progress Updates:    1:1  Pt received in the w/c. Pt able to don both shoes with setup by crossing her leg in figure 4 position without difficulty. Pt masked and taken into the hallway. Pt ambulated from her room to the end of the RN station (the further side) with RW with contact guard at a slow pace. Pt required min A to come up into standing. After seated rest break - pt tried to come up into standing again for another walk but was unable due to pain. Transported into the gym and perform UE/ Core endurance and strengthening exercises with blue weighted ball (~ 3 lbs) performed chest presses, lifting ball, and diagonal movements. Then used theratubing for more resistance movements for shoulder strengthening in all planes. Attempted other sit to stand and standing balance activities; pt reports too painful and unable to continue with these.  Taken to the ADL apartment and into bathroom to discuss shower setup - daughter came to meet [name] and discussed the option of a tub bench to acces her current shower setup with the doors removed and tension rod with curtain put up. Pt/ daughter long term plan is to remodel but this was a solution in the mean time.   Pt left sitting up in the w/c and passed off to the next therapist.   Therapy Documentation Precautions:   Precautions Precautions: Fall, Other (comment) Precaution Comments: expressive aphasia Restrictions Weight Bearing Restrictions: No General:   Vital Signs: Therapy Vitals Temp: 97.7 F (36.5 C) Temp Source: Oral Pulse Rate: (!) 58 Resp: 19 BP: (!) 180/57 Patient Position (if appropriate): Lying Oxygen Therapy SpO2: 97 % O2 Device: Room Air Pain:  Pt with ongoing right knee pain - needed to request meds after ambulating - unrated pain  Therapy/Group: Individual Therapy  Willeen Cass [facility] 06/19/2022, 8:24 PM

## 2022-06-19 NOTE — Progress Notes (Signed)
Physical Therapy Session Note  Patient Details  Name: Rebekah Pope MRN: [id number] Date of Birth: 03-14-1941  Today's Date: 06/19/2022 PT Individual Time: 1400-1430 PT Individual Time Calculation (min): 30 min   Short Term Goals: Week 1:  PT Short Term Goal 1 (Week 1): Pt will perform bed mobiltiy with min assist PT Short Term Goal 2 (Week 1): Pt will trasnfer to [facility] with min assist and LRAD PT Short Term Goal 3 (Week 1): Pt will ambulate 39ft with min assist and LRAD PT Short Term Goal 4 (Week 1): Pt will propell WC 16ft with min assist  Skilled Therapeutic Interventions/Progress Updates:    Pt seated in w/c on arrival and agreeable to therapy. Pt reports ongoing severe knee BIL today, Pt agreeable to gentle exercise for endurance. Pt transported to therapy gym for time management and energy conservation. Stand pivot transfer with mod A for Sit to stand to RW and min A for transfer to nustep. Utilized 2 x 6 min at level 4 for endurance and non-weightbearing activity for pain management. 2 min rest break for pain management. Pt maintained 30-40 spm for 2 x 6 min, then performed 2 min "sprint" at level 1 and >60 spm for increased cardiovascular challenge. Pt returned to w/c and to room in same manner, remained in w/c and was left with all needs in reach and alarm active.   Therapy Documentation Precautions:  Precautions Precautions: Fall, Other (comment) Precaution Comments: expressive aphasia Restrictions Weight Bearing Restrictions: No General:       Therapy/Group: Individual Therapy  Mickel Fuchs 06/19/2022, 2:13 PM

## 2022-06-19 NOTE — Progress Notes (Signed)
PROGRESS NOTE   Subjective/Complaints:  No issues , appreciate notes from cardiology team , appt with Dr Caryl Comes planned 3 wks post discharge   ROS-limited by aphasia   Objective:   No results found. No results for input(s): "WBC", "HGB", "HCT", "PLT" in the last 72 hours.  No results for input(s): "NA", "K", "CL", "CO2", "GLUCOSE", "BUN", "CREATININE", "CALCIUM" in the last 72 hours.   Intake/Output Summary (Last 24 hours) at 06/19/2022 0742 Last data filed at 06/18/2022 1306 Gross per 24 hour  Intake 350 ml  Output --  Net 350 ml         Physical Exam: Vital Signs Blood pressure (!) 184/56, pulse (!) 51, temperature 98.4 F (36.9 C), temperature source Oral, resp. rate 18, height 5\' 3"  (1.6 m), weight 81.6 kg, SpO2 93 %.   General: No acute distress Mood and affect are appropriate Heart: Bradycardic Lungs: Clear to auscultation, breathing unlabored, no rales or wheezes Abdomen: Positive bowel sounds, soft nontender to palpation, nondistended Extremities: No clubbing, cyanosis, or edema Skin: No evidence of breakdown, no evidence of rash Neurologic: alert , follows simple commands, fluent aphasia motor strength is 5/5 in left 4+/5 right  deltoid, bicep, tricep, grip, hip flexor, knee extensors, ankle dorsiflexor and plantar flexor Sensory exam cannot assess aphasic   Musculoskeletal: Full range of motion in all 4 extremities. No joint swelling. Right sided lean  Assessment/Plan: 1. Functional deficits which require 3+ hours per day of interdisciplinary therapy in a comprehensive inpatient rehab setting. Physiatrist is providing close team supervision and 24 hour management of active medical problems listed below. Physiatrist and rehab team continue to assess barriers to discharge/monitor patient progress toward functional and medical goals  Care Tool:  Bathing    Body parts bathed by patient: Right arm, Left  arm, Chest, Abdomen, Front perineal area, Right lower leg, Right upper leg, Face         Bathing assist Assist Level: Moderate Assistance - Patient 50 - 74%     Upper Body Dressing/Undressing Upper body dressing   What is the patient wearing?: Pull over shirt    Upper body assist Assist Level: Minimal Assistance - Patient > 75%    Lower Body Dressing/Undressing Lower body dressing      What is the patient wearing?: Pants     Lower body assist Assist for lower body dressing: Maximal Assistance - Patient 25 - 49%     Toileting Toileting    Toileting assist Assist for toileting: Maximal Assistance - Patient 25 - 49%     Transfers Chair/bed transfer  Transfers assist     Chair/bed transfer assist level: Moderate Assistance - Patient 50 - 74% Chair/bed transfer assistive device: Armrests, Programmer, multimedia   Ambulation assist      Assist level: Moderate Assistance - Patient 50 - 74% Assistive device: Parallel bars Max distance: 15   Walk 10 feet activity   Assist     Assist level: Moderate Assistance - Patient - 50 - 74% Assistive device: Parallel bars   Walk 50 feet activity   Assist Walk 50 feet with 2 turns activity did not occur: Safety/medical concerns  Walk 150 feet activity   Assist Walk 150 feet activity did not occur: Safety/medical concerns         Walk 10 feet on uneven surface  activity   Assist Walk 10 feet on uneven surfaces activity did not occur: Safety/medical concerns         Wheelchair     Assist Is the patient using a wheelchair?: Yes Type of Wheelchair: Manual    Wheelchair assist level: Moderate Assistance - Patient 50 - 74% Max wheelchair distance: 100    Wheelchair 50 feet with 2 turns activity    Assist        Assist Level: Moderate Assistance - Patient 50 - 74%   Wheelchair 150 feet activity     Assist      Assist Level: Maximal Assistance - Patient 25 -  49%   Blood pressure (!) 184/56, pulse (!) 51, temperature 98.4 F (36.9 C), temperature source Oral, resp. rate 18, height 5\' 3"  (1.6 m), weight 81.6 kg, SpO2 93 %.  Medical Problem List and Plan: 1. 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  Antithrombotics: -DVT/anticoagulation: Eliquis             -antiplatelet therapy: N/A 3. Pain Management: Tylenol as needed 4. Mood/Behavior/Sleep: Provide emotional support             -antipsychotic agents: N/A 5. Neuropsych/cognition: This patient is not capable of making decisions on her own behalf. 6. Skin/Wound Care: Routine skin checks 7. Fluids/Electrolytes/Nutrition: Routine in and outs with follow-up chemistries 8.  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  Follow-up outpatient cardiology.  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  CKD.  Baseline creatinine 1.20-1.30.  Follow-up chemistries on admit. 10.  Diastolic congestive heart failure.  Monitor for any signs of fluid overload.  Continue Entresto 24-26 mg twice daily             -daily weights, messaged RN to weigh patient on 1/6 11.  Hypothyroidism.  continue Synthroid 12.  Hyperlipidemia.  continue Lipitor 13.  Constipation.  continue MiraLAX daily, Colace 100 mg twice daily 14. Bradycardia: continue to monitor HR TID. F/u with cardiology regarding amiodarone dosing if symptomatic   15.  RSV-  on droplet prec, no cough , SOB , or resp distress noted on exam today.  Has been afebrile, no cough thus far this am.  Will cont supportive care.  Droplet precautions- may be able to d/c precautions ~1/15 LOS: 8 days A FACE TO FACE EVALUATION WAS PERFORMED  Charlett Blake 06/19/2022, 7:42 AM

## 2022-06-19 NOTE — Progress Notes (Signed)
Occupational Therapy Session Note  Patient Details  Name: Rebekah Pope MRN: [id number] Date of Birth: 1940-09-30  Today's Date: 06/19/2022 OT Individual Time: 2409-7353 OT Individual Time Calculation (min): 57 min    Short Term Goals: Week 1:  OT Short Term Goal 1 (Week 1): Pt will complete toilet transfer using LRAD min A OT Short Term Goal 2 (Week 1): Pt will complete LB dressing min A OT Short Term Goal 3 (Week 1): Pt will utilize RUE during functional tasks ~75% with supervision OT Short Term Goal 4 (Week 1): Pt will complete sit>stand for BADLs min A using LRAD  Skilled Therapeutic Interventions/Progress Updates: Patient requiring increased assist with functional transfers from prior notes. Patient reporting B knee pain and R flank pain. Patient received medication just prior to therapy starting. Continue with skilled OT POC to improve patient's independence with self care and functional transfers.     Therapy Documentation Precautions:  Precautions Precautions: Fall, Other (comment) Precaution Comments: expressive aphasia Restrictions Weight Bearing Restrictions: No   Pain:[name] 6-8/10 with transfer. Patient stating when asked for a 1-10 number "I don't know but it's pretty hard"   GDJ:MEQASTM received resting in bed. Assisted patient to EOB with Mod assist. ADL Eating: Minimal assistance Where Assessed-Eating: Edge of bed Grooming: Minimal assistance Where Assessed-Grooming: Wheelchair @ sink Upper Body Bathing: Set-up assistance Where Assessed-Upper Body Bathing: Sitting at sink Lower Body Bathing: Moderate assistance Where Assessed-Lower Body Bathing: Sitting at sink Upper Body Dressing: set up  assistance Where Assessed-Upper Body Dressing: Wheelchair Lower Body Dressing: Max assistance Where Assessed-Lower Body Dressing: Wheelchair  Therapeutic exercise- Patient instructed on thera putty HEP. Patient with good task performance going through each  exercise 5 x per hand then switching sides. Able to perform strength and coordination tasks without complaint.  Vision- Glasses   Balance- Max assist with static stand for clothing management   Therapy/Group: Individual Therapy  Hermina Barters 06/19/2022, 9:21 AM

## 2022-06-19 NOTE — Progress Notes (Signed)
Occupational Therapy Session Note  Patient Details  Name: Rebekah Pope MRN: [id number] Date of Birth: Aug 16, 1940  Today's Date: 06/19/2022 OT Individual Time: 1020-1102 OT Individual Time Calculation (min): 42 min    Short Term Goals: Week 1:  OT Short Term Goal 1 (Week 1): Pt will complete toilet transfer using LRAD min A OT Short Term Goal 2 (Week 1): Pt will complete LB dressing min A OT Short Term Goal 3 (Week 1): Pt will utilize RUE during functional tasks ~75% with supervision OT Short Term Goal 4 (Week 1): Pt will complete sit>stand for BADLs min A using LRAD  Skilled Therapeutic Interventions/Progress Updates:  Skilled OT intervention completed with focus on cognitive sequencing and dynamic standing balance needed for independence with BADLs. Pt received seated in w/c, agreeable to session. Un-rated pain reported in R knee however declined pain meds this time as she had meds this morning before session. OT offered rest breaks and repositioning throughout for pain reduction.  Pt declined self-care needs, however did request help with checking her written attempt of the alphabet. Pt had written about 50% of the alphabet with appropriate legibility however with only about 50% accuracy. Pt was able to verbalize correct sequence with about 80% accuracy, but with OT writing the verbalize sequence. When pt wrote alphabet on 2nd trial, she was able to complete correct sequence to "x" until she made 1 mistake with min A needed to recognize, then completed rest to z with independence.  Transported dependently in w/c <> gym as pt politely declined ambulation at this time.   At [facility], to address cognition, anterior weight shifting, dynamic standing balance pt completed the following: -Bells cancellation- >5 mins to complete but did find all bells, 3 errors -Speed dot test- 2.36 sec (seated), 8.66 sec (standing) -Maze- mod A initially to problem solve fading to supervision (in stance) Pt  declined standing without RW, and on 1st couple of trials pt required min A to stand with RW when pushing up with both hands from arm rests, up to mod A when cued to push up with 1 hand from arm rest. CGA needed for standing balance using RW and erect posture cues to prevent forward lean on RW in stance.  Back in room, pt remained seated in w/c, with belt alarm on/activated, and with all needs in reach at end of session.   Therapy Documentation Precautions:  Precautions Precautions: Fall, Other (comment) Precaution Comments: expressive aphasia Restrictions Weight Bearing Restrictions: No    Therapy/Group: Individual Therapy  Blase Mess, MS, OTR/L  06/19/2022, 11:14 AM

## 2022-06-20 NOTE — Progress Notes (Signed)
PROGRESS NOTE   Subjective/Complaints:  Appreciate cardiology note  Pt remains severely aphasic   ROS-limited by aphasia   Objective:   No results found. No results for input(s): "WBC", "HGB", "HCT", "PLT" in the last 72 hours.  No results for input(s): "NA", "K", "CL", "CO2", "GLUCOSE", "BUN", "CREATININE", "CALCIUM" in the last 72 hours.   Intake/Output Summary (Last 24 hours) at 06/20/2022 0903 Last data filed at 06/19/2022 1314 Gross per 24 hour  Intake 240 ml  Output --  Net 240 ml         Physical Exam: Vital Signs Blood pressure (!) 180/58, pulse (!) 47, temperature 98.5 F (36.9 C), resp. rate 14, height 5\' 3"  (1.6 m), weight 81 kg, SpO2 95 %.   General: No acute distress Mood and affect are appropriate Heart: Bradycardic Lungs: Clear to auscultation, breathing unlabored, no rales or wheezes Abdomen: Positive bowel sounds, soft nontender to palpation, nondistended Extremities: No clubbing, cyanosis, or edema Skin: No evidence of breakdown, no evidence of rash Neurologic: alert , follows simple commands, fluent aphasia motor strength is 5/5 in left 4+/5 right  deltoid, bicep, tricep, grip, hip flexor, knee extensors, ankle dorsiflexor and plantar flexor Sensory exam cannot assess aphasic   Musculoskeletal: Full range of motion in all 4 extremities. No joint swelling. Right sided lean  Assessment/Plan: 1. Functional deficits which require 3+ hours per day of interdisciplinary therapy in a comprehensive inpatient rehab setting. Physiatrist is providing close team supervision and 24 hour management of active medical problems listed below. Physiatrist and rehab team continue to assess barriers to discharge/monitor patient progress toward functional and medical goals  Care Tool:  Bathing    Body parts bathed by patient: Face         Bathing assist Assist Level: Set up assist     Upper Body  Dressing/Undressing Upper body dressing   What is the patient wearing?: Pull over shirt    Upper body assist Assist Level: Supervision/Verbal cueing Assistive Device Comment: cues to orient shirt  Lower Body Dressing/Undressing Lower body dressing      What is the patient wearing?: Pants, Incontinence brief     Lower body assist Assist for lower body dressing: Maximal Assistance - Patient 25 - 49%     Toileting Toileting    Toileting assist Assist for toileting: Maximal Assistance - Patient 25 - 49%     Transfers Chair/bed transfer  Transfers assist     Chair/bed transfer assist level: Maximal Assistance - Patient 25 - 49% Chair/bed transfer assistive device: Armrests, Walker   Locomotion Ambulation   Ambulation assist      Assist level: Moderate Assistance - Patient 50 - 74% Assistive device: Parallel bars Max distance: 15   Walk 10 feet activity   Assist     Assist level: Moderate Assistance - Patient - 50 - 74% Assistive device: Parallel bars   Walk 50 feet activity   Assist Walk 50 feet with 2 turns activity did not occur: Safety/medical concerns         Walk 150 feet activity   Assist Walk 150 feet activity did not occur: Safety/medical concerns  Walk 10 feet on uneven surface  activity   Assist Walk 10 feet on uneven surfaces activity did not occur: Safety/medical concerns         Wheelchair     Assist Is the patient using a wheelchair?: Yes Type of Wheelchair: Manual    Wheelchair assist level: Moderate Assistance - Patient 50 - 74% Max wheelchair distance: 100    Wheelchair 50 feet with 2 turns activity    Assist        Assist Level: Moderate Assistance - Patient 50 - 74%   Wheelchair 150 feet activity     Assist      Assist Level: Maximal Assistance - Patient 25 - 49%   Blood pressure (!) 180/58, pulse (!) 47, temperature 98.5 F (36.9 C), resp. rate 14, height 5\' 3"  (1.6 m), weight 81  kg, SpO2 95 %.  Medical Problem List and Plan: 1. 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  Antithrombotics: -DVT/anticoagulation: Eliquis             -antiplatelet therapy: N/A 3. Pain Management: Tylenol as needed 4. Mood/Behavior/Sleep: Provide emotional support             -antipsychotic agents: N/A 5. Neuropsych/cognition: This patient is not capable of making decisions on her own behalf. 6. Skin/Wound Care: Routine skin checks 7. Fluids/Electrolytes/Nutrition: Routine in and outs with follow-up chemistries 8.  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  Follow-up outpatient cardiology.  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  CKD.  Baseline creatinine 1.20-1.30.  Follow-up chemistries on admit. 10.  Diastolic congestive heart failure.  Monitor for any signs of fluid overload.  Continue Entresto 24-26 mg twice daily             -daily weights, messaged RN to weigh patient on 1/6 11.  Hypothyroidism.  continue Synthroid 12.  Hyperlipidemia.  continue Lipitor 13.  Constipation.  continue MiraLAX daily, Colace 100 mg twice daily 14. Bradycardia: continue to monitor HR TID. F/u with cardiology regarding amiodarone dosing if symptomatic   15.  RSV- on droplet prec, no cough , SOB , or resp distress noted on exam today.  Has been afebrile, no cough thus far this am.  Will cont supportive care.   Droplet /contact precautions- able to d/c precautions ~1/15 LOS: 9 days A FACE TO FACE EVALUATION WAS PERFORMED  Charlett Blake 06/20/2022, 9:03 AM

## 2022-06-20 NOTE — Progress Notes (Signed)
Physical Therapy Weekly Progress Note  Patient Details  Name: Rebekah Pope MRN: [id number] Date of Birth: [birth year]  Beginning of progress report period: June 12, 2022 End of progress report period: June 20, 2022  Today's Date: 06/20/2022 PT Individual Time: 1751-0258 PT Individual Time Calculation (min): 49 min   Patient has met 3 of 4 short term goals due to not focusing on wheelchair propulsion at this time based on goal for pt to be ambulatory. Ms. Hocevar is progressing well with therapy demonstrating increasing independence with functional mobility; however, is most limited recently by R knee pain. She is performing supine<>sit using bed features with supervision/min assist, sit<>stands using RW with min/mod assist (impaired ability to power up due to knee pain and mild R hemiparesis), stand pivot transfers using RW with min assist for balance once in standing, and gait up to 165ft using RW with CGA/min assist based on knee pain. Her functional mobility continues to be impacted by mild R hemiparesis, impaired balance, impaired endurance, and R knee pain.  She will benefit from continued CIR level skilled physical therapy to further progress her independence with functional mobility prior to D/Cing home with 24hr support.  Patient continues to demonstrate the following deficits muscle weakness, decreased cardiorespiratoy endurance, impaired timing and sequencing, unbalanced muscle activation, and decreased motor planning,  , decreased awareness and decreased problem solving, and decreased standing balance, decreased postural control, and decreased balance strategies and therefore will continue to benefit from skilled PT intervention to increase functional independence with mobility.  Patient progressing toward long term goals..  Continue plan of care.  PT Short Term Goals Week 1:  PT Short Term Goal 1 (Week 1): Pt will perform bed mobiltiy with min assist PT Short Term Goal 1  - Progress (Week 1): Met PT Short Term Goal 2 (Week 1): Pt will trasnfer to [facility] with min assist and LRAD PT Short Term Goal 2 - Progress (Week 1): Met PT Short Term Goal 3 (Week 1): Pt will ambulate 20ft with min assist and LRAD PT Short Term Goal 3 - Progress (Week 1): Met PT Short Term Goal 4 (Week 1): Pt will propell WC 161ft with min assist PT Short Term Goal 4 - Progress (Week 1): Discontinued (comment) (goal for pt to be ambulatory) Week 2:  PT Short Term Goal 1 (Week 2): Pt will perform supine<>sit with supervision using bed features per home set-up PT Short Term Goal 2 (Week 2): Pt will perform sit<>stands using LRAD with CGA PT Short Term Goal 3 (Week 2): Pt will perform bed<>chair transfers using LRAD with CGA PT Short Term Goal 4 (Week 2): Pt will ambulate at least 16ft using LRAD with CGA PT Short Term Goal 5 (Week 2): Pt will ambulate up/down ramp using LRAD with min assist  Skilled Therapeutic Interventions/Progress Updates:  Ambulation/gait training;Community reintegration;DME/adaptive equipment instruction;Neuromuscular re-education;Psychosocial support;Stair training;UE/LE Strength taining/ROM;Balance/vestibular training;Discharge planning;Functional electrical stimulation;Pain management;Skin care/wound management;Therapeutic Activities;UE/LE Coordination activities;Cognitive remediation/compensation;Disease management/prevention;Functional mobility training;Splinting/orthotics;Patient/family education;Therapeutic Exercise;Visual/perceptual remediation/compensation;Wheelchair propulsion/positioning   Pt received sitting in w/c and agreeable to therapy session. Pt continues to have expressive>receptive aphasia, but therapist understood pt reporting she has been having difficulty calling her daughter. Assessed vitals: BP 147/61 (MAP 88), HR 49bpm    Transported to/from gym in w/c for time management and energy conservation.  Sit>stand w/c>RW with min assist for lifting to stand and  pt still lacking adequate forward trunk lean requiring cuing to improve, cuing to push up from w/c armrests rather than pulling  up on RW - continues to be slow to rise with impaired ability to power up into standing, partially limited due to R knee pain.  Gait training using RW total of 10ft, but having pain in R knee starting around 2ft, requiring +2 to bring w/c up behind patient due to being unable to finish ambulating the ~25ft back to her w/c due to the intensity of the pain - started with CGA progressed to min assist for balance and safety. Pt premedicated prior to session.  Vitals after gait: BP 191/64 (MAP 99), HR 45bpm   R stand pivot w/c>EOM using RW with heavy min assist to come to standing with pt continuing to have increased R knee pain - pt relying heavily on RW to off-weight R LE while turning due to the pain.  Sit>supine with light min assist for R LE management onto mat.   Therapist assessed R knee and noted slight increase in anterior medial swelling compared to L knee. Performed gentle knee flexion/extension PROM x15 reps through small ROM (not going to terminal extension) followed by gentle grade 1 knee join mobilizations 2x30 seconds and soft tissue mobilization to anterior tibialis 5 minutes. Pt reports improvement in her pain following this.   Supine>sitting R EOM with light min assist. Sit>stand EOM>RW with min assist for lifting to stand and pt reporting decreased R knee pain, L stand pivot transfer to w/c using RW min assist but when pt initiated lowering into wheelchair R knee pain onset again.  Transported back to her room. Reassessed vitals at end of session: BP 183/75 (MAP 105), HR 47bpm   Nurse made aware of pt's vitals and planning to perform manual assessment. Pt left seated in w/c with needs in reach and seat belt alarm on. Therapist assisted pt in calling her daughter.  Therapy Documentation Precautions:  Precautions Precautions: Fall, Other  (comment) Precaution Comments: expressive aphasia Restrictions Weight Bearing Restrictions: No  Pain: Due to aphasia, pain is difficult to rate, but pt reports it as being significant and unable to finish gait trial requiring +2 to bring up chair behind her - performed manual therapy as described above for pain management - premedicated - modified interventions for pain management.  Therapy/Group: Individual Therapy  Tawana Scale , PT, DPT, NCS, CSRS 06/20/2022, 9:59 AM

## 2022-06-21 NOTE — Progress Notes (Signed)
Patient having a difficult time stand pivot with rolling walker to WC. Patient unable to complete task. Steady  x 1 used to assist patient to the bathroom

## 2022-06-21 NOTE — Progress Notes (Signed)
Patient continues to refuse Voltaren pain rub to her knees. Patients states no like. Educated patient it may help with the pain in her bilateral knees. Offered ice pack for additional comfort.

## 2022-06-21 NOTE — Progress Notes (Signed)
PROGRESS NOTE   Subjective/Complaints:  Pt reports a little abdominal pain/LBM last night- denies constipation.  York Spaniel has pain only when hip flexed.   Otherwise doing "pretty well".    ROS-limited by aphasia    Objective:   No results found. No results for input(s): "WBC", "HGB", "HCT", "PLT" in the last 72 hours.  No results for input(s): "NA", "K", "CL", "CO2", "GLUCOSE", "BUN", "CREATININE", "CALCIUM" in the last 72 hours.   Intake/Output Summary (Last 24 hours) at 06/21/2022 1239 Last data filed at 06/20/2022 1755 Gross per 24 hour  Intake 210 ml  Output --  Net 210 ml        Physical Exam: Vital Signs Blood pressure (!) 178/61, pulse (!) 50, temperature 97.6 F (36.4 C), temperature source Oral, resp. rate 18, height 5\' 3"  (1.6 m), weight 79.8 kg, SpO2 96 %.    General: awake, alert, appropriate, supine in bed; NAD HENT: conjugate gaze; oropharynx moist CV: bradycardic rate- regular rhythm; no JVD Pulmonary: CTA B/L; no W/R/R- good air movement GI: soft, NT, ND, (+)BS- Not TTP throughout abd, even when I flexed R hip Psychiatric: appropriate; flat affect- very little spontaneous speech Neurological: expressive aphasia- many word substitutions-   Skin: No evidence of breakdown, no evidence of rash Neurologic: alert , follows simple commands, fluent aphasia motor strength is 5/5 in left 4+/5 right  deltoid, bicep, tricep, grip, hip flexor, knee extensors, ankle dorsiflexor and plantar flexor Sensory exam cannot assess aphasic   Musculoskeletal: Full range of motion in all 4 extremities. No joint swelling. Right sided lean  Assessment/Plan: 1. Functional deficits which require 3+ hours per day of interdisciplinary therapy in a comprehensive inpatient rehab setting. Physiatrist is providing close team supervision and 24 hour management of active medical problems listed below. Physiatrist and rehab team  continue to assess barriers to discharge/monitor patient progress toward functional and medical goals  Care Tool:  Bathing    Body parts bathed by patient: Face         Bathing assist Assist Level: Set up assist     Upper Body Dressing/Undressing Upper body dressing   What is the patient wearing?: Pull over shirt    Upper body assist Assist Level: Supervision/Verbal cueing Assistive Device Comment: cues to orient shirt  Lower Body Dressing/Undressing Lower body dressing      What is the patient wearing?: Pants, Incontinence brief     Lower body assist Assist for lower body dressing: Maximal Assistance - Patient 25 - 49%     Toileting Toileting    Toileting assist Assist for toileting: Maximal Assistance - Patient 25 - 49%     Transfers Chair/bed transfer  Transfers assist     Chair/bed transfer assist level: Maximal Assistance - Patient 25 - 49% Chair/bed transfer assistive device: Armrests, Walker   Locomotion Ambulation   Ambulation assist      Assist level: Moderate Assistance - Patient 50 - 74% Assistive device: Parallel bars Max distance: 15   Walk 10 feet activity   Assist     Assist level: Moderate Assistance - Patient - 50 - 74% Assistive device: Parallel bars   Walk 50 feet activity  Assist Walk 50 feet with 2 turns activity did not occur: Safety/medical concerns         Walk 150 feet activity   Assist Walk 150 feet activity did not occur: Safety/medical concerns         Walk 10 feet on uneven surface  activity   Assist Walk 10 feet on uneven surfaces activity did not occur: Safety/medical concerns         Wheelchair     Assist Is the patient using a wheelchair?: Yes Type of Wheelchair: Manual    Wheelchair assist level: Moderate Assistance - Patient 50 - 74% Max wheelchair distance: 100    Wheelchair 50 feet with 2 turns activity    Assist        Assist Level: Moderate Assistance - Patient  50 - 74%   Wheelchair 150 feet activity     Assist      Assist Level: Maximal Assistance - Patient 25 - 49%   Blood pressure (!) 178/61, pulse (!) 50, temperature 97.6 F (36.4 C), temperature source Oral, resp. rate 18, height 5\' 3"  (1.6 m), weight 79.8 kg, SpO2 96 %.  Medical Problem List and Plan: 1. 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  Antithrombotics: -DVT/anticoagulation: Eliquis             -antiplatelet therapy: N/A 3. Pain Management: Tylenol as needed 4. Mood/Behavior/Sleep: Provide emotional support             -antipsychotic agents: N/A 5. Neuropsych/cognition: This patient is not capable of making decisions on her own behalf. 6. Skin/Wound Care: Routine skin checks 7. Fluids/Electrolytes/Nutrition: Routine in and outs with follow-up chemistries 8.  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  Follow-up outpatient cardiology.  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  CKD.  Baseline creatinine 1.20-1.30.  Follow-up chemistries on admit. 10.  Diastolic congestive heart failure.  Monitor for any signs of fluid overload.  Continue Entresto 24-26 mg twice daily             -daily weights, messaged RN to weigh patient on 1/6 11.  Hypothyroidism.  continue Synthroid 12.  Hyperlipidemia.  continue Lipitor 13.  Constipation.  continue MiraLAX daily, Colace 100  mg twice daily  1/14- LBM last night- con't to monitor 14. Bradycardia: continue to monitor HR TID. F/u with cardiology regarding amiodarone dosing if symptomatic   15.  RSV- on droplet prec, no cough , SOB , or resp distress noted on exam today.  Has been afebrile, no cough thus far this am.  Will cont supportive care.  Droplet /contact precautions- able to d/c precautions ~1/15  1/14- no cough, resp Sx's   LOS: 10 days A FACE TO FACE EVALUATION WAS PERFORMED  Jacqualine Weichel 06/21/2022, 12:39 PM

## 2022-06-22 NOTE — Progress Notes (Signed)
Occupational Therapy Weekly Progress Note  Patient Details  Name: Rebekah Pope MRN: [id number] Date of Birth: 1940/11/12  Beginning of progress report period: June 12, 2022 End of progress report period: June 22, 2022  Today's Date: 06/22/2022 OT Individual Time: 7782-4235 OT Individual Time Calculation (min): 63 min    Patient has met 1 of 4 short term goals.  Pt is demonstrating increased use of her RUE during functional tasks and initiates use with min cueing requiring. Pt currently limited in completing sit>stands, toilet transfers, and LB dressing d/t R chronic knee pain with pain exacerbated d/t weakness 2/2 mild R hemiparesis. Pt is receiving support from her Dtr and son who both frequently visit and inquire/participate in therapy sessions. Pt is currently performing UB BADLs with supervision, LB BADLs mod A,and transfers heavy min/mod A using RW +time.  Patient continues to demonstrate the following deficits: muscle weakness and muscle paralysis, decreased cardiorespiratoy endurance, unbalanced muscle activation, decreased coordination, and decreased motor planning, left side neglect and decreased motor planning, decreased awareness, decreased problem solving, decreased safety awareness, and decreased memory, central origin, and decreased sitting balance, decreased standing balance, decreased postural control, hemiplegia, and decreased balance strategies and therefore will continue to benefit from skilled OT intervention to enhance overall performance with BADL, iADL, and Reduce care partner burden.  Patient progressing toward long term goals..  Continue plan of care.  OT Short Term Goals Week 1:  OT Short Term Goal 1 (Week 1): Pt will complete toilet transfer using LRAD min A OT Short Term Goal 1 - Progress (Week 1): Progressing toward goal OT Short Term Goal 2 (Week 1): Pt will complete LB dressing min A OT Short Term Goal 2 - Progress (Week 1): Progressing toward goal OT  Short Term Goal 3 (Week 1): Pt will utilize RUE during functional tasks ~75% with supervision OT Short Term Goal 3 - Progress (Week 1): Met OT Short Term Goal 4 (Week 1): Pt will complete sit>stand for BADLs min A using LRAD OT Short Term Goal 4 - Progress (Week 1): Progressing toward goal  Skilled Therapeutic Interventions/Progress Updates:     Pt received with nursing staff present in room assisting Pt with toileting needs. Nursing staff reporting Pt unable to complete squat>pivot transfer to [facility] prior to OT arrival d/t pain and decreased trunk alignment. OT updated pt safety plan to use SaraSteady for toilet transfers to increase safety for Pt and nursing staff with Pt and staff in agreement. Focus this session functional transfers and BADL retraining.  Pt transitioned from supine to EOB min A to lift trunk and mod cues for technique. Pt politely refusing need for shower today, but receptive to changing clothes. Pt donned shirt seated EOB supervision and pants mod A to bring pants to wait in standing. Sit>stand using RW mod A +time with mod cues for trunk and head position. Pt presenting to be in significant pain- receptive to Voltaren pain rub with Rn contacted for application; R knee compression brace donned to decrease pain and Pt offered intermittent rest breaks.   Pt tearful during session x2. Utilized Camera operator with Pt writing out statements d/t communication deficits 2/2 expressive aphasia. Pt speech intelligible, but able to write "not together" and "phone" on white board with OT clarifying Pt was upset d/t not being with husband, husband sick, and not being able to communicate with husband when speaking on phone. Pt provided therapeutic support and listening with noted improvement.   Functional mobility  training in room using RW heaving min A+ time d/t decreased step length. Stand pivot transfers to wc<>EOB x2 heavy min/light mod with practice.   Pt attempted to  complete grooming and hygiene tasks standing at sink using RW, but unable to maintain standing position d/t pain. Pt able to complete tasks seated with supervision utilizing RUE during tasks with min cueing required. Pt was left resting in wc with call bell in reach, chair alarm on, and all needs met.   Therapy Documentation Precautions:  Precautions Precautions: Fall, Other (comment) Precaution Comments: expressive aphasia Restrictions Weight Bearing Restrictions: No General:   Vital Signs:  Pain: Pain Assessment Pain Scale: 0-10 Pain Score: 0-No pain ADL: ADL Eating: Minimal assistance Where Assessed-Eating: Edge of bed Grooming: Minimal assistance Where Assessed-Grooming: Wheelchair Upper Body Bathing: Minimal assistance Where Assessed-Upper Body Bathing: Shower Lower Body Bathing: Moderate assistance Where Assessed-Lower Body Bathing: Shower Upper Body Dressing: Minimal assistance Where Assessed-Upper Body Dressing: Wheelchair Lower Body Dressing: Moderate assistance Where Assessed-Lower Body Dressing: Wheelchair Toileting: Moderate assistance Where Assessed-Toileting: Glass blower/designer: Moderate assistance Toilet Transfer Method: Counselling psychologist: Grab bars, Raised toilet seat Tub/Shower Transfer: Not assessed Social research officer, government: Moderate assistance Social research officer, government Method: Print production planner with back Glass blower/designer   Exercises:   Other Treatments:     Therapy/Group: Individual Therapy  Janey Genta 06/22/2022, 10:19 AM

## 2022-06-22 NOTE — Progress Notes (Signed)
PROGRESS NOTE   Subjective/Complaints:  No issues overnight  Occ RIght knee pain, pt has knee pain at home  ROS-limited by aphasia    Objective:   No results found. No results for input(s): "WBC", "HGB", "HCT", "PLT" in the last 72 hours.  No results for input(s): "NA", "K", "CL", "CO2", "GLUCOSE", "BUN", "CREATININE", "CALCIUM" in the last 72 hours.   Intake/Output Summary (Last 24 hours) at 06/22/2022 0716 Last data filed at 06/21/2022 2100 Gross per 24 hour  Intake 280 ml  Output --  Net 280 ml         Physical Exam: Vital Signs Blood pressure (!) 158/65, pulse (!) 53, temperature 98.4 F (36.9 C), temperature source Oral, resp. rate 18, height 5\' 3"  (1.6 m), weight 79 kg, SpO2 96 %.   General: No acute distress Mood and affect are appropriate Heart: Regular rate and rhythm no rubs murmurs or extra sounds Lungs: Clear to auscultation, breathing unlabored, no rales or wheezes Abdomen: Positive bowel sounds, soft nontender to palpation, nondistended Extremities: No clubbing, cyanosis, or edema  Skin: No evidence of breakdown, no evidence of rash Neurologic: alert , follows simple commands, fluent aphasia motor strength is 5/5 in left 4+/5 right  deltoid, bicep, tricep, grip, hip flexor, knee extensors, ankle dorsiflexor and plantar flexor Sensory exam cannot assess aphasic   Musculoskeletal: Full range of motion in all 4 extremities. No joint swelling.R knee valgus deformity .  No erythema , no tenderness or pain with ROM , normal ROM   Assessment/Plan: 1. Functional deficits which require 3+ hours per day of interdisciplinary therapy in a comprehensive inpatient rehab setting. Physiatrist is providing close team supervision and 24 hour management of active medical problems listed below. Physiatrist and rehab team continue to assess barriers to discharge/monitor patient progress toward functional and medical  goals  Care Tool:  Bathing    Body parts bathed by patient: Face         Bathing assist Assist Level: Set up assist     Upper Body Dressing/Undressing Upper body dressing   What is the patient wearing?: Pull over shirt    Upper body assist Assist Level: Supervision/Verbal cueing Assistive Device Comment: cues to orient shirt  Lower Body Dressing/Undressing Lower body dressing      What is the patient wearing?: Pants, Incontinence brief     Lower body assist Assist for lower body dressing: Maximal Assistance - Patient 25 - 49%     Toileting Toileting    Toileting assist Assist for toileting: Maximal Assistance - Patient 25 - 49%     Transfers Chair/bed transfer  Transfers assist     Chair/bed transfer assist level: Maximal Assistance - Patient 25 - 49% Chair/bed transfer assistive device: Armrests, Walker   Locomotion Ambulation   Ambulation assist      Assist level: Moderate Assistance - Patient 50 - 74% Assistive device: Parallel bars Max distance: 15   Walk 10 feet activity   Assist     Assist level: Moderate Assistance - Patient - 50 - 74% Assistive device: Parallel bars   Walk 50 feet activity   Assist Walk 50 feet with 2 turns activity did not  occur: Safety/medical concerns         Walk 150 feet activity   Assist Walk 150 feet activity did not occur: Safety/medical concerns         Walk 10 feet on uneven surface  activity   Assist Walk 10 feet on uneven surfaces activity did not occur: Safety/medical concerns         Wheelchair     Assist Is the patient using a wheelchair?: Yes Type of Wheelchair: Manual    Wheelchair assist level: Moderate Assistance - Patient 50 - 74% Max wheelchair distance: 100    Wheelchair 50 feet with 2 turns activity    Assist        Assist Level: Moderate Assistance - Patient 50 - 74%   Wheelchair 150 feet activity     Assist      Assist Level: Maximal  Assistance - Patient 25 - 49%   Blood pressure (!) 158/65, pulse (!) 53, temperature 98.4 F (36.9 C), temperature source Oral, resp. rate 18, height 5\' 3"  (1.6 m), weight 79 kg, SpO2 96 %.  Medical Problem List and Plan: 1. 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  Antithrombotics: -DVT/anticoagulation: Eliquis 5mg  BID            -antiplatelet therapy: N/A 3. Pain Management: Tylenol as needed 4. Mood/Behavior/Sleep: Provide emotional support             -antipsychotic agents: N/A 5. Neuropsych/cognition: This patient is not capable of making decisions on her own behalf. 6. Skin/Wound Care: Routine skin checks 7. Fluids/Electrolytes/Nutrition: Routine in and outs with follow-up chemistries 8.  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  Follow-up outpatient cardiology.  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  CKD.  Baseline creatinine 1.20-1.30.  Follow-up chemistries on admit. 10.  Diastolic congestive heart failure.  Monitor for any signs of fluid overload.  Continue Entresto 24-26 mg twice daily             -daily weights, messaged RN to weigh patient on 1/6 11.  Hypothyroidism.  continue Synthroid 12.  Hyperlipidemia.  continue Lipitor 13.  Constipation.  continue MiraLAX daily, Colace 100 mg twice daily  1/14- LBM last night- con't to monitor 14.  Bradycardia: continue to monitor HR TID. F/u with cardiology regarding amiodarone dosing if symptomatic   15.  RSV- on droplet prec, no cough , SOB , or resp distress noted on exam today.  Has been afebrile, no cough thus far this am.  Will cont supportive care.  Droplet /contact precautions- able to d/c precautions today   Pt afeb no cough     LOS: 11 days A FACE TO FACE EVALUATION WAS PERFORMED  Luanna Salk  Hedaya Latendresse 06/22/2022, 7:16 AM

## 2022-06-22 NOTE — Progress Notes (Signed)
Physical Therapy Session Note  Patient Details  Name: Rebekah Pope MRN: [id number] Date of Birth: 17-Mar-1941  Today's Date: 06/22/2022 PT Individual Time: 1410-1520 PT Individual Time Calculation (min): 70 min   Short Term Goals: Week 2:  PT Short Term Goal 1 (Week 2): Pt will perform supine<>sit with supervision using bed features per home set-up PT Short Term Goal 2 (Week 2): Pt will perform sit<>stands using LRAD with CGA PT Short Term Goal 3 (Week 2): Pt will perform bed<>chair transfers using LRAD with CGA PT Short Term Goal 4 (Week 2): Pt will ambulate at least 49ft using LRAD with CGA PT Short Term Goal 5 (Week 2): Pt will ambulate up/down ramp using LRAD with min assist  Skilled Therapeutic Interventions/Progress Updates:  Chart reviewed and pt agreeable to therapy. Pt received seated in recliner with no c/o pain. Session focused on functional mobility to promote home access. Pt transported to therapy gym to attempt amb with ramp. Pt initiated session with attempt to sit to stand with RW using MaxA + RW. Pt then given VC for technique to improve efficiency, but pt still unable to come to stand. Pt made 3 attempts at stand, but only completed partial stand. Pt then returned to bedside. Pt was then placed in front of bedrail and was able to stand with B handrail + MaxA. Pt verbalized increasing pn in RLE but did not quantify. Pt made second attempt at stand with B handrails + MaxA, but pt again communicated high pn in right knee. Pt remained polite, but expressed frustration and sadness about R knee pain. PT adjusted WC to further facilitate stand, but pt again only able to come to partical stand with MaxA + B handrail. Pt then communicated that R knee had been bent a long time while in [place]. PT and pt decide to transfer to recliner and attempt extending legs to allow R knee to rest. Pt transferred to recliner using SPT with [place]. Pt then sat and completed 2x8 B LAQ with no pn. PT  retrieved 3# ankle weight, but pt c/o R knee pain with weighted LAQ. Pt retreived 1# ankel weight and theraband. Pt competed 2 x10 LAQ with 1# weight and 2x10 hip abd with yuellow t-band. RN notified of pt pain. and resisted hip abd and was instructed to continue these exercises in recliner. At end of session, pt was left seated in recliner with alarm engaged, nurse call bell and all needs in reach.     Therapy Documentation Precautions:  Precautions Precautions: Fall, Other (comment) Precaution Comments: expressive aphasia Restrictions Weight Bearing Restrictions: No General:      Therapy/Group: Individual Therapy  Marquette Old, PT, DPT 06/22/2022, 3:28 PM

## 2022-06-22 NOTE — Progress Notes (Signed)
Speech Language Pathology Daily Session Note  Patient Details  Name: Rebekah Pope MRN: [id number] Date of Birth: [birth year]  Today's Date: 06/22/2022 SLP Individual Time: 8413-2440 SLP Individual Time Calculation (min): 57 min  Short Term Goals: Week 2: SLP Short Term Goal 1 (Week 2): Patient will name common objects with 50% accuracy given min A verbal/visual cues SLP Short Term Goal 2 (Week 2): Patient will utilize multimodal communication means (e.g., picture based communication board) to communicate functional needs during 3/5 opportunities throughout session with min A verbal/visual cues for effectiveness SLP Short Term Goal 3 (Week 2): Patient will follow 1-step commands during functional tasks with min A verbal cues to achieve 80% accuracy SLP Short Term Goal 4 (Week 2): Patient will respond to semi-complex yes/no questions with min A verbal/visual cues to achieve 75% accuracy SLP Short Term Goal 5 (Week 2): Pt will demonstrate awareness and attempt to repair verbal errors during at least 3 occasions throughout session with mod A verbal cues  Skilled Therapeutic Interventions: Skilled ST treatment focused on communication goals. Pt was greeted in wheelchair on arrival and passed off by OT. Pt was tearful explaining discomfort in knees. Notified nurse and pt was given medication. SLP facilitated verbal expression tasks using various environments around the hospital, including atrium and gift shop. Pt was able to name objects in gift shop with min A phonemic cues; verbally described items by feature with mod A phonemic cues and field of choices. Pt read at the phrase and short sentence level on greeting cards with 50% accuracy. Pt often made syntactical errors without awareness or attempts to repair. Pt verbalized at the phrase and short sentence level with continued evidence of semantic and phonemic paraphasias, and decreased instances of neologisms. Pt still appears unaware of many  paraphasias, but awareness appears to be improving. Pt was able to communicate functional needs regarding comfort and preferences with overall supervision-to-min A for effectiveness, through responding to yes/no questions, verbal expression at the word and phrase level, and through gestures. Patient was left in wheelchair with alarm activated and immediate needs within reach at end of session. Continue per current plan of care.    Therapy/Group: Individual Therapy  Patty Sermons 06/22/2022, 12:36 PM

## 2022-06-23 NOTE — Progress Notes (Signed)
Speech Language Pathology Daily Session Note  Patient Details  Name: Rebekah Pope MRN: [id number] Date of Birth: Oct 14, 1940  Today's Date: 06/23/2022 SLP Individual Time: 07-1533 SLP Individual Time Calculation (min): 60 min  Short Term Goals: Week 2: SLP Short Term Goal 1 (Week 2): Patient will name common objects with 50% accuracy given min A verbal/visual cues SLP Short Term Goal 2 (Week 2): Patient will utilize multimodal communication means (e.g., picture based communication board) to communicate functional needs during 3/5 opportunities throughout session with min A verbal/visual cues for effectiveness SLP Short Term Goal 3 (Week 2): Patient will follow 1-step commands during functional tasks with min A verbal cues to achieve 80% accuracy SLP Short Term Goal 4 (Week 2): Patient will respond to semi-complex yes/no questions with min A verbal/visual cues to achieve 75% accuracy SLP Short Term Goal 5 (Week 2): Pt will demonstrate awareness and attempt to repair verbal errors during at least 3 occasions throughout session with mod A verbal cues  Skilled Therapeutic Interventions: Skilled ST treatment focused on language goals. Pt was greeted upright in wheelchair and accompanied by son. Pt appeared in good spirits. Denied active pain but stated "they've been hurting so bad" while rubbing her knees.   SLP facilitated the following language tasks using stimuli from tactus therapy naming app, and [facility]:   Selecting a word that matches definition given field of 3 written choices: 100% accuracy given mod A verbal and written cues. Pt verbally read responses with 100% accuracy.  Responding to questions given written field of 3 possible answers: 40% accuracy given min A verbal cues, progressing to 80% given mod A verbal and written cues. Pt verbally read at the short sentence level with ~50% accuracy with neologisms, phonemic paraphasias, and errors with syntax.   Object ID  based on features: 100% accuracy in field of 2 given sup A verbal cues.  Object naming: 11% accuracy independent of cues; 34% given min A verbal cues; 73% accuracy given mod A verbal and visual cues; 84% accuracy given max A verbal and visual cues. Pt with frequent perseveration of "bicycle" today even though a bicycle was not addressed during session.   Pt continues to respond well to ST interventions and demonstrating steady improvements with both expressive and receptive language skills. Son is pleased with pt's progress.   Patient was left in wheelchair with alarm activated and immediate needs within reach at end of session. Continue per current plan of care.       Pain    Therapy/Group: Individual Therapy  Patty Sermons 06/23/2022, 2:46 PM

## 2022-06-23 NOTE — Progress Notes (Signed)
Patient ID: Rebekah Pope, female   DOB: 18-May-1941, 82 y.o.   MRN: [id number]  SW spoke with patients daughter, Rebekah Pope. Daughter expressed she has reviewed the resources but really has not discussed them through with her dad/family. Currently, daughters plan is to travel to [facility] on 1/24 to remain in [place] for a couple of week to establish a HC routine. Daughter requesting patient to be extended to 1/24 to allow her to travel to [place] with arrangements on this date.  Daughter inquiring if the patient will need a hospital bed at d/c. No additional questions or concerns.

## 2022-06-23 NOTE — Progress Notes (Signed)
Physical Therapy Session Note  Patient Details  Name: Rebekah Pope MRN: [id number] Date of Birth: 07-19-1940  Today's Date: 06/23/2022 PT Individual Time: 1000-1040 PT Individual Time Calculation (min): 40 min   Short Term Goals: Week 2:  PT Short Term Goal 1 (Week 2): Pt will perform supine<>sit with supervision using bed features per home set-up PT Short Term Goal 2 (Week 2): Pt will perform sit<>stands using LRAD with CGA PT Short Term Goal 3 (Week 2): Pt will perform bed<>chair transfers using LRAD with CGA PT Short Term Goal 4 (Week 2): Pt will ambulate at least 72ft using LRAD with CGA PT Short Term Goal 5 (Week 2): Pt will ambulate up/down ramp using LRAD with min assist  Skilled Therapeutic Interventions/Progress Updates:    Pt received sitting in w/c and agreeable to therapy session. Pt continues to have expressive>receptive aphasia, but is able to communicate continued R knee pain that is limiting her participation in therapy.  Transported to/from gym in w/c for time management and energy conservation.  Sit>stand w/c>RW with heavy min/light mod assist to come to standing with pt continuing to be very slow to rise and benefit from cuing for increased anterior trunk lean and pt relying heavily on B UE support to lift hips and bring trunk upright, attempting to off-weight her LEs.   Attempted to initiate gait training using RW; however, after attempting 1 step pt states she cannot walk at this time due to significant R knee pain. Therapist notified MD of pt's increased pain limiting her ability to participate in gait training since 1/13.  L lateral scoot transfer w/c>EOM to limit R LE WBing with mod assist for lifting/pivoting hips and pt not reporting increase in knee pain with this.  Seated EOM R LE long arc quads 2x15 reps for gentle AROM for pain management.  Sit<>supine on mat table with supervision and increased time/effort.  Performed gentle R LE anterior tibialis  soft tissue mobilization with pt continuing to report tenderness to touch and pt states this is where her pain is located when attempting R LE WBing tasks, but difficult to fully assess due to aphasia. Pt denies pain with active ankle DF therefore performed 2x10 reps of ankle DF against level 3 green theraband resistance.   Sit>standing EOM>RW with heavy min assist for lifting to stand and continued cuing as above with pt continuing to rise slowly. Pt appears to have some pain relief at this time with increased WBing tolerance. Able to perform R stand pivot to w/c using RW with min assist for balance and no grimacing or antalgic movements to indicate significant R knee pain at this time.  Will continue to attempt active R ankle DF against resistance prior to [facility] activities to determine if this consistently improves pt's pain. Will also attempt e-stim to R quads when pads are available to determine if increased muscle activation will improve support of the joint.  Transported back to her room and pt left seated in w/c with needs in reach and seat belt alarm on.   Therapy Documentation Precautions:  Precautions Precautions: Fall, Other (comment) Precaution Comments: expressive aphasia Restrictions Weight Bearing Restrictions: No   Pain:  Details above - pre-medicated - unable to tolerate gait.   Therapy/Group: Individual Therapy  Tawana Scale , PT, DPT, NCS, CSRS 06/23/2022, 7:57 AM

## 2022-06-23 NOTE — Progress Notes (Addendum)
Occupational Therapy Session Note  Patient Details  Name: Rebekah Pope MRN: [id number] Date of Birth: May 20, 1941  Today's Date: 06/23/2022 OT Individual Time: 0800-0900 OT Individual Time Calculation (min): 60 min OT Individual Time: 1315-1400 OT Individual Time Calculation (min): 45 min    Short Term Goals: Week 2:  OT Short Term Goal 1 (Week 2): Pt will complete LB dressing min A- continue OT Short Term Goal 2 (Week 2): Pt will complete toilet transfer min A using LRAD- continue OT Short Term Goal 3 (Week 2): Pt will complete sit>stand for BADLs min A using LRAD- continue  Skilled Therapeutic Interventions/Progress Updates:     AM Session: Pt received supine in bed presenting to be in good spirits and receptive to skilled OT session. Pt reporting she slept well and reporting unrated pain in bilateral knees R>L- requested pain medications from RN, donned R LE compression brace, and offered rest breaks/modifications. Pt requesting to take shower during AM session, but unable to complete this AM d/t pain- will attempt to complete shower this PM if able to do so safely with decreased pain.  Pt brief soiled upon OT arrival with Pt receptive to going to bathroom for clean up. Supine>EOB min A HOB elevated. Pt attempted sit>stand x3 with rest breaks in between, but unable to come to full stand for transfer d/t pain. Pt completed squat pivot transfer to wc and transported to bathroom, however, upon arriving to bathroom Pt politely refusing to transfer to [facility] d/t pain and anxiety. Pt stating "fall" when OT suggesting transfer. Pt provided therapeutic support and education on safety for transfers to prevent falls, however Pt tearful and anxious so transfer not attempted. Pt transported back to room to complete bed level BADLs. Squat pivot to EOB mod A with mod cues for technique +time. Pt able to roll R/L to assist with anterior/posterior peri care with CGA. Pt educated on donning clothing bed  level to increase independence and decrease pain in weight bearing. Pt able to weave feet into pants and complete partial bridge x2 to bring pants to waist with mod A d/t first learning experience. Pt donned shirt supervision. Pt supine>EOB supervision with HOB elevated. Pt able to complete squat pivot transfer to R to wc mod A with mod cues for technique. Pt completed grooming/hygiene tasks seated at sink for pain management. Pt was left resting in her wc with call bell in reach, seat belt alarm on, and all needs met.   PM Session: Pt received sitting up in wc presenting to be in good spirits and receptive to therapy session. Visitor from [employer] present in room upon OT arrival. Pt politely refusing shower this session. Pt reporting 0/10 in knee at rest- reporting she received pain medications and topical NSAID prior to session. Pt transported from room to therapy gym total A in wc for time management.   Pt completed visual motor task using small peg board and RUE to place pegs. Pt instructed to complete task in standing to strengthen BLEs. Pt sit>stand min A+ time using RW and min cues to recall hand placement. In standing, Pt noted to have increase in knee pain. Pt leaning heavily on LLE to decrease pain and unable to maintain equal weight between BLEs. Provided visual pain rating scale with Pt reporting 8/10 pain- seated rest break provided. Task graded down to being completed seated to prevent pain. Pt able to manipulate small pegs using RUE without dropping and replicate pattern from picture with min cues to  correct single mistake. Pt removed pegs from board using pad to pad pincer grasp and translating pegs to palm of hand to facilitate isolated thumb and 1st digit movements.   Pt completed squat pivot tranfers wc<>EOM with heavy min A ++time. Pt completed seated dynamic sitting balance activities to increase strength required for BADLs and IADLs. Pt able to reach inferiorly and return to upright  position CGA-light min A with mod motivational cues and cues for body alignment. Pt fatigued following activity with rest break provided.   Pt transported back to room total A in wc. Pt was left resting in her wc with call bell in reach, seat belt alarm on, and all needs met.   Therapy Documentation Precautions:  Precautions Precautions: Fall, Other (comment) Precaution Comments: expressive aphasia Restrictions Weight Bearing Restrictions: No   ADL: ADL Eating: Minimal assistance Where Assessed-Eating: Edge of bed Grooming: Minimal assistance Where Assessed-Grooming: Wheelchair Upper Body Bathing: Minimal assistance Where Assessed-Upper Body Bathing: Shower Lower Body Bathing: Moderate assistance Where Assessed-Lower Body Bathing: Shower Upper Body Dressing: Minimal assistance Where Assessed-Upper Body Dressing: Wheelchair Lower Body Dressing: Moderate assistance Where Assessed-Lower Body Dressing: Wheelchair Toileting: Moderate assistance Where Assessed-Toileting: Glass blower/designer: Moderate assistance Toilet Transfer Method: Counselling psychologist: Grab bars, Raised toilet seat Tub/Shower Transfer: Not assessed Social research officer, government: Moderate assistance Social research officer, government Method: Heritage manager: Shower seat with back  Therapy/Group: Individual Therapy  Janey Genta 06/23/2022, 9:02 AM

## 2022-06-23 NOTE — Progress Notes (Addendum)
PROGRESS NOTE   Subjective/Complaints:  No issues overnite , remains severely aphasic  ROS-limited by aphasia    Objective:   No results found. No results for input(s): "WBC", "HGB", "HCT", "PLT" in the last 72 hours.  No results for input(s): "NA", "K", "CL", "CO2", "GLUCOSE", "BUN", "CREATININE", "CALCIUM" in the last 72 hours.   Intake/Output Summary (Last 24 hours) at 06/23/2022 0723 Last data filed at 06/22/2022 1754 Gross per 24 hour  Intake 590 ml  Output --  Net 590 ml         Physical Exam: Vital Signs Blood pressure (!) 159/75, pulse (!) 50, temperature 97.9 F (36.6 C), temperature source Oral, resp. rate 18, height 5\' 3"  (1.6 m), weight 78.5 kg, SpO2 98 %.   General: No acute distress Mood and affect are appropriate Heart: Regular rate and rhythm no rubs murmurs or extra sounds Lungs: Clear to auscultation, breathing unlabored, no rales or wheezes Abdomen: Positive bowel sounds, soft nontender to palpation, nondistended Extremities: No clubbing, cyanosis, or edema  Skin: No evidence of breakdown, no evidence of rash Neurologic: alert , follows simple commands, fluent aphasia motor strength is 5/5 in left 4+/5 right  deltoid, bicep, tricep, grip, hip flexor, knee extensors, ankle dorsiflexor and plantar flexor Sensory exam cannot assess aphasic     Assessment/Plan: 1. Functional deficits which require 3+ hours per day of interdisciplinary therapy in a comprehensive inpatient rehab setting. Physiatrist is providing close team supervision and 24 hour management of active medical problems listed below. Physiatrist and rehab team continue to assess barriers to discharge/monitor patient progress toward functional and medical goals  Care Tool:  Bathing    Body parts bathed by patient: Face         Bathing assist Assist Level: Set up assist     Upper Body Dressing/Undressing Upper body dressing    What is the patient wearing?: Pull over shirt    Upper body assist Assist Level: Supervision/Verbal cueing Assistive Device Comment: cues to orient shirt  Lower Body Dressing/Undressing Lower body dressing      What is the patient wearing?: Pants, Incontinence brief     Lower body assist Assist for lower body dressing: Maximal Assistance - Patient 25 - 49%     Toileting Toileting    Toileting assist Assist for toileting: Maximal Assistance - Patient 25 - 49%     Transfers Chair/bed transfer  Transfers assist     Chair/bed transfer assist level: Maximal Assistance - Patient 25 - 49% Chair/bed transfer assistive device: Armrests, Walker   Locomotion Ambulation   Ambulation assist      Assist level: Moderate Assistance - Patient 50 - 74% Assistive device: Parallel bars Max distance: 15   Walk 10 feet activity   Assist     Assist level: Moderate Assistance - Patient - 50 - 74% Assistive device: Parallel bars   Walk 50 feet activity   Assist Walk 50 feet with 2 turns activity did not occur: Safety/medical concerns         Walk 150 feet activity   Assist Walk 150 feet activity did not occur: Safety/medical concerns         Walk  10 feet on uneven surface  activity   Assist Walk 10 feet on uneven surfaces activity did not occur: Safety/medical concerns         Wheelchair     Assist Is the patient using a wheelchair?: Yes Type of Wheelchair: Manual    Wheelchair assist level: Moderate Assistance - Patient 50 - 74% Max wheelchair distance: 100    Wheelchair 50 feet with 2 turns activity    Assist        Assist Level: Moderate Assistance - Patient 50 - 74%   Wheelchair 150 feet activity     Assist      Assist Level: Maximal Assistance - Patient 25 - 49%   Blood pressure (!) 159/75, pulse (!) 50, temperature 97.9 F (36.6 C), temperature source Oral, resp. rate 18, height 5\' 3"  (1.6 m), weight 78.5 kg, SpO2 98  %.  Medical Problem List and Plan: 1. 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  Antithrombotics: -DVT/anticoagulation: Eliquis 5mg  BID - monitor CBC check in am            -antiplatelet therapy: N/A 3. Pain Management: Tylenol as needed 4. Mood/Behavior/Sleep: Provide emotional support             -antipsychotic agents: N/A 5. Neuropsych/cognition: This patient is not capable of making decisions on her own behalf. 6. Skin/Wound Care: Routine skin checks 7. Fluids/Electrolytes/Nutrition: Routine in and outs with follow-up chemistries- fluid intake has been poor check BMET in am  8.  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  Follow-up outpatient cardiology.  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  CKD.  Baseline creatinine 1.20-1.30.  Follow-up chemistries on admit.repeat BMET in am  10.  Diastolic congestive heart failure.  Monitor for any signs of fluid overload.  Continue Entresto 24-26 mg twice daily             -daily weights, messaged RN to weigh patient on 1/6 11.  Hypothyroidism.  continue Synthroid 12.  Hyperlipidemia.  continue Lipitor 13.  Constipation.  continue MiraLAX daily, Colace 100 mg twice daily  1/14- LBM last night- con't to monitor 14. Bradycardia: continue to monitor HR TID. F/u with cardiology regarding amiodarone  dosing if symptomatic   15.  RSV- on droplet prec, no cough , SOB , or resp distress noted on exam today.  Has been afebrile, no cough thus far this am.  Will cont supportive care.  Droplet /contact precautions- able to d/c precautions 1/15   Pt afeb no cough     LOS: 12 days A FACE TO FACE EVALUATION WAS PERFORMED  Charlett Blake 06/23/2022, 7:23 AM

## 2022-06-24 ENCOUNTER — Inpatient Hospital Stay (HOSPITAL_COMMUNITY): Payer: Medicare PPO

## 2022-06-24 DIAGNOSIS — D649 Anemia, unspecified: Secondary | ICD-10-CM

## 2022-06-24 DIAGNOSIS — R638 Other symptoms and signs concerning food and fluid intake: Secondary | ICD-10-CM

## 2022-06-24 DIAGNOSIS — N189 Chronic kidney disease, unspecified: Secondary | ICD-10-CM

## 2022-06-24 LAB — BASIC METABOLIC PANEL
Anion gap: 7 (ref 5–15)
BUN: 12 mg/dL (ref 8–23)
CO2: 27 mmol/L (ref 22–32)
Calcium: 8.8 mg/dL — ABNORMAL LOW (ref 8.9–10.3)
Chloride: 105 mmol/L (ref 98–111)
Creatinine, Ser: 0.95 mg/dL (ref 0.44–1.00)
GFR, Estimated: 59 mL/min — ABNORMAL LOW (ref >60)
Glucose, Bld: 101 mg/dL — ABNORMAL HIGH (ref 70–99)
Potassium: 3.7 mmol/L (ref 3.5–5.1)
Sodium: 139 mmol/L (ref 135–145)

## 2022-06-24 LAB — CBC
HCT: 33 % — ABNORMAL LOW (ref 36.0–46.0)
Hemoglobin: 11.3 g/dL — ABNORMAL LOW (ref 12.0–15.0)
MCH: 29.6 pg (ref 26.0–34.0)
MCHC: 34.2 g/dL (ref 30.0–36.0)
MCV: 86.4 fL (ref 80.0–100.0)
Platelets: 224 10*3/uL (ref 150–400)
RBC: 3.82 MIL/uL — ABNORMAL LOW (ref 3.87–5.11)
RDW: 13.5 % (ref 11.5–15.5)
WBC: 8 10*3/uL (ref 4.0–10.5)
nRBC: 0 % (ref 0.0–0.2)

## 2022-06-24 MED ORDER — ACETAMINOPHEN 325 MG PO TABS
650.0000 mg | ORAL_TABLET | Freq: Three times a day (TID) | ORAL | Status: DC
  Administered 2022-06-24 – 2022-06-25 (×3): 650 mg via ORAL
  Filled 2022-06-24 (×4): qty 2

## 2022-06-24 MED ORDER — ACETAMINOPHEN 160 MG/5ML PO SOLN
650.0000 mg | Freq: Three times a day (TID) | ORAL | Status: DC
  Filled 2022-06-24 (×6): qty 20.3

## 2022-06-24 MED ORDER — ACETAMINOPHEN 650 MG RE SUPP
650.0000 mg | Freq: Three times a day (TID) | RECTAL | Status: DC
  Filled 2022-06-24: qty 1

## 2022-06-24 NOTE — Plan of Care (Signed)
  Problem: RH BLADDER ELIMINATION Goal: RH STG MANAGE BLADDER WITH ASSISTANCE Description: STG Manage Bladder With toileting Assistance Outcome: Not Progressing; incontinence   

## 2022-06-24 NOTE — Progress Notes (Signed)
Patient ID: Rebekah Pope, female   DOB: [birth year], 82 y.o.   MRN: [id number]  Team Conference Report to Patient/Family  Team Conference discussion was reviewed with the patient and caregiver, including goals, any changes in plan of care and target discharge date.  Patient and caregiver express understanding and are in agreement.  The patient has a target discharge date of 07/01/22.  SW spoke with patient daughter, Rebekah Pope. Daughter anticipates being present for family education on 07/26/22 1-4 PM. Patient set to discharge home on 1/24 per daughter request. No additional questions or concerns.   [name] 06/24/2022, 1:52 PM

## 2022-06-24 NOTE — Progress Notes (Signed)
Occupational Therapy Session Note  Patient Details  Name: Rebekah Pope MRN: [id number] Date of Birth: April 06, 1941  Today's Date: 06/24/2022 OT Individual Time: 8676-1950 OT Individual Time Calculation (min): 58 min    Short Term Goals: Week 2:  OT Short Term Goal 1 (Week 2): Pt will complete LB dressing min A- continue OT Short Term Goal 2 (Week 2): Pt will complete toilet transfer min A using LRAD- continue OT Short Term Goal 3 (Week 2): Pt will complete sit>stand for BADLs min A using LRAD- continue  Skilled Therapeutic Interventions/Progress Updates:     Pt received sitting up in [facility] on phone placing food orders for upcoming meals. Pt requiring increased time for word finding, but able to verbalize food items when reading off menu given multiple attempts. OT provided therapeutic motivation and affirmation following task to increase Pt moral. Pt self-efficacy noted to be improving this session. 0/10 pain reported at rest with pain in weightbearing increasing to 10/10- Pt provided intermittent rest breaks, repositioning, modifications, and R knee compression sleeve to decrease pain (pt reporting she already received pain medications prior to session). Focus this session pain management, functional transfers, and BADL retraining.  Pt completed BLE AAROM seated in wc to decrease pain prior to mobilization. Pt attempted sit>stand using RW mod A, but Pt unable to tolerate full weight bearing on RLE. Pt transported total A to bathroom in wc to prevent increased pain. Pt completed squat pivot transfer to shower chair holding onto grab bars mod A and mod cues provided for technique, safety, and body mechanics. Pt able to complete UB bathing supervision and LB bathing CGA when reaching towards ground to wash feet and leaning R/L to wash bottom. Following shower Pt donned shirt supervision seated on shower chair. Squat pivot>wc mod A +time with max cues for technique. TEDs and R knee sleeve donned on  Pt total A. Pt able to weave feet into pants and bring pants above knees with min A. Pt utilized bed rail to pull self to partial standing with Mod A in order for OT to bring pants to waist and don brief max A.  Pt completed grooming/hygiene tasks seated at sink supervision. Pt curling hair at end of session, handed off to nursing staff present in room with call bell in reach, seat belt alarm on, and all immediate needs met. OT called Dtr. Rita following session to provide update on pt progress and d/c plan with OT enforcing need for Pt to have 24/7 supervision-min A at all time following d/c.   Therapy Documentation Precautions:  Precautions Precautions: Fall, Other (comment) Precaution Comments: expressive aphasia Restrictions Weight Bearing Restrictions: No General:   Vital Signs:  Pain: Pain Assessment Pain Scale: Faces Faces Pain Scale: Hurts even more Pain Type: Chronic pain Pain Location: Knee Pain Orientation: Right Pain Descriptors / Indicators: Aching Pain Frequency: Occasional Pain Onset: With Activity Pain Intervention(s): Medication (See eMAR) ADL: ADL Eating: Minimal assistance Where Assessed-Eating: Edge of bed Grooming: Minimal assistance Where Assessed-Grooming: Wheelchair Upper Body Bathing: Minimal assistance Where Assessed-Upper Body Bathing: Shower Lower Body Bathing: Moderate assistance Where Assessed-Lower Body Bathing: Shower Upper Body Dressing: Minimal assistance Where Assessed-Upper Body Dressing: Wheelchair Lower Body Dressing: Moderate assistance Where Assessed-Lower Body Dressing: Wheelchair Toileting: Moderate assistance Where Assessed-Toileting: Glass blower/designer: Moderate assistance Toilet Transfer Method: Counselling psychologist: Grab bars, Raised toilet seat Tub/Shower Transfer: Not assessed Social research officer, government: Moderate assistance Social research officer, government Method: Print production planner  with back  Therapy/Group: Individual Therapy  Janey Genta 06/24/2022, 11:04 AM

## 2022-06-24 NOTE — Patient Care Conference (Signed)
Inpatient RehabilitationTeam Conference and Plan of Care Update Date: 06/24/2022   Time: 10:40 AM    Patient Name: Rebekah Pope      Medical Record Number: [id number]  Date of Birth: 02-Jan-1941 Sex: Female         Room/Bed: 4M05C/4M05C-01 Payor Info: Payor: [employer] MEDICARE / Plan: [employer] MEDICARE CHOICE PPO / Product Type: *No Product type* /    Admit Date/Time:  06/11/2022  4:10 PM  Primary Diagnosis:  Left middle cerebral artery stroke [facility])  Hospital Problems: Principal Problem:   Left middle cerebral artery stroke [facility])    Expected Discharge Date: Expected Discharge Date: 07/01/22  Team Members Present: Physician leading conference: Dr. Leeroy Cha Social Worker Present: Erlene Quan, BSW Nurse Present: Dorien Chihuahua, RN PT Present: Page Spiro, PT OT Present: Other (comment) Lowella Fairy, OT) SLP Present: Sherren Kerns, SLP PPS Coordinator present : Gunnar Fusi, SLP     Current Status/Progress Goal Weekly Team Focus  Bowel/Bladder   Pt is continent/incontinent of bowel/bladder   Pt will gain full continence of bowel/bladder   Will assess qshift and PRN    Swallow/Nutrition/ Hydration               ADL's   toilet transfer mod A (max-total A when in significant pain), shower transfer mod A using RW, UB bathing/dressing CGA for sitting balance, LB bathing mod A, LB dressing mod-max A, supervision grooming at wc level; limited in therapy sessions by pain in RLE, improved FM coordination   CGA for BADLs to min for transfers   BADL retraining, transfer trianing, functional mobility, FM/VM coordination, d/c planning, family education, pain management    Mobility   min assist bed mobility using bed features, mod/max assist sit>stand using RW (impaired due to significant R knee pain and poor ability to power up), once in standing can perform stand pivot using RW with CGA/min assist (dependig on R knee pain levels has recently had to do lateral scoot  transfers due to pain), has not participated in gait training since 06/20/22 and was only ambulating up to 91ft using RW at that time with CGA   CGA/supervision overall at ambulatory level  pt/family education, bed mobility training, transfer training, gait training, dynamic standing balance, R hemibody NMR, pain management, activity tolerance    Communication   min-to-mod A   sup A comprehension, min-to-mod A expression   multimodal communication, word finding, yes/no questions, following commands    Safety/Cognition/ Behavioral Observations               Pain   Pt states pain is 0/10   Pt will remain pain free   Will assess qshift and PRN    Skin   Pt's skin is intact   Pt's skin will remain intact  Will assess qshift and PRN      Discharge Planning:  Daughter has made arrangements to travel to remain in [place] a couple of weeks on 1/24. Daughter requesting 1-day extension to allow travel. Daughter anticipates arranging [facility] during her stay.   Team Discussion: Patient with bradycardia, limited by right knee pain post left MCA CVA/A-fib.  Patient on target to meet rehab goals: Currently needs supervision for upper body ADLs and mod assist for bathing and max assist for dressing lower body. Completes sit - stand using a stedy; unable to ambulate due to knee pain.  Completes lateral transfers with mod assist.    *See Care Plan and progress notes for long and short-term goals.  Revisions to Treatment Plan:  Knee injection requested   Teaching Needs: Safety, medications, transfers, toileting, etc.   Current Barriers to Discharge: Decreased caregiver support  Possible Resolutions to Barriers: Family education HH follow up services     Medical Summary Current Status: overweight, bradycardia, elevated systolic BP and soft diastolic BP, prior RSV infection, constipation, paroxysmal atrial fibrillation, pain  Barriers to Discharge: Medical stability  Barriers to Discharge  Comments: overweight, bradycardia, elevated systolic BP and soft diastolic BP, prior RSV infection, constipation, paroxysmal atrial fibrillation, pain Possible Resolutions to [employer] Focus: provide dietary education, continue to monitor BP TID, d/c RSV precautions, continue miralax and colace, continue amiodarone, schedule tylenol   Continued Need for Acute Rehabilitation Level of Care: The patient requires daily medical management by a physician with specialized training in physical medicine and rehabilitation for the following reasons: Direction of a multidisciplinary physical rehabilitation program to maximize functional independence : Yes Medical management of patient stability for increased activity during participation in an intensive rehabilitation regime.: Yes Analysis of laboratory values and/or radiology reports with any subsequent need for medication adjustment and/or medical intervention. : Yes   I attest that I was present, lead the team conference, and concur with the assessment and plan of the team.   Dorien Chihuahua B 06/24/2022, 2:59 PM

## 2022-06-24 NOTE — Progress Notes (Signed)
PROGRESS NOTE   Subjective/Complaints: Has had knee pain with therapy on Right. Knee pain is improved with voltaren gel.  ROS-limited by aphasia    Objective:   No results found. Recent Labs    06/24/22 0733  WBC 8.0  HGB 11.3*  HCT 33.0*  PLT 224    Recent Labs    06/24/22 0733  NA 139  K 3.7  CL 105  CO2 27  GLUCOSE 101*  BUN 12  CREATININE 0.95  CALCIUM 8.8*     Intake/Output Summary (Last 24 hours) at 06/24/2022 0828 Last data filed at 06/23/2022 1830 Gross per 24 hour  Intake 118 ml  Output --  Net 118 ml         Physical Exam: Vital Signs Blood pressure (!) 153/57, pulse (!) 50, temperature 98.1 F (36.7 C), resp. rate 14, height 5\' 3"  (1.6 m), weight 78.3 kg, SpO2 98 %.   General: No acute distress Mood and affect are appropriate Heart: Regular rate and rhythm no rubs murmurs or extra sounds Lungs: Clear to auscultation, breathing unlabored, no rales or wheezes Abdomen: Positive bowel sounds, soft nontender to palpation, nondistended Extremities: No clubbing, cyanosis, or edema  Skin: No evidence of breakdown, no evidence of rash Neurologic: alert , follows simple commands, fluent aphasia motor strength is 5/5 in left 4+/5 right  deltoid, bicep, tricep, grip, hip flexor, knee extensors, ankle dorsiflexor and plantar flexor Sensory exam cannot assess aphasic  MSK: no significant knee joint line tenderness noted    Assessment/Plan: 1. Functional deficits which require 3+ hours per day of interdisciplinary therapy in a comprehensive inpatient rehab setting. Physiatrist is providing close team supervision and 24 hour management of active medical problems listed below. Physiatrist and rehab team continue to assess barriers to discharge/monitor patient progress toward functional and medical goals  Care Tool:  Bathing    Body parts bathed by patient: Face, Right arm, Left arm, Chest,  Abdomen, Right upper leg, Left upper leg, Front perineal area         Bathing assist Assist Level: Moderate Assistance - Patient 50 - 74%     Upper Body Dressing/Undressing Upper body dressing   What is the patient wearing?: Pull over shirt    Upper body assist Assist Level: Supervision/Verbal cueing Assistive Device Comment: cues to orient shirt  Lower Body Dressing/Undressing Lower body dressing      What is the patient wearing?: Pants, Incontinence brief     Lower body assist Assist for lower body dressing: Maximal Assistance - Patient 25 - 49%     Toileting Toileting    Toileting assist Assist for toileting: Maximal Assistance - Patient 25 - 49%     Transfers Chair/bed transfer  Transfers assist     Chair/bed transfer assist level: Maximal Assistance - Patient 25 - 49% Chair/bed transfer assistive device: Armrests, Walker   Locomotion Ambulation   Ambulation assist      Assist level: Moderate Assistance - Patient 50 - 74% Assistive device: Parallel bars Max distance: 15   Walk 10 feet activity   Assist     Assist level: Moderate Assistance - Patient - 50 - 74% Assistive device: Parallel  bars   Walk 50 feet activity   Assist Walk 50 feet with 2 turns activity did not occur: Safety/medical concerns         Walk 150 feet activity   Assist Walk 150 feet activity did not occur: Safety/medical concerns         Walk 10 feet on uneven surface  activity   Assist Walk 10 feet on uneven surfaces activity did not occur: Safety/medical concerns         Wheelchair     Assist Is the patient using a wheelchair?: Yes Type of Wheelchair: Manual    Wheelchair assist level: Moderate Assistance - Patient 50 - 74% Max wheelchair distance: 100    Wheelchair 50 feet with 2 turns activity    Assist        Assist Level: Moderate Assistance - Patient 50 - 74%   Wheelchair 150 feet activity     Assist      Assist Level:  Maximal Assistance - Patient 25 - 49%   Blood pressure (!) 153/57, pulse (!) 50, temperature 98.1 F (36.7 C), resp. rate 14, height 5\' 3"  (1.6 m), weight 78.3 kg, SpO2 98 %.  Medical Problem List and Plan: 1. 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 Formulized individual treatment plan based on medical history, underlying problem and comorbidities.   -Called daughter to update to progress  2.  Antithrombotics: -DVT/anticoagulation: Eliquis 5mg  BID - monitor CBC check in am            -antiplatelet therapy: N/A 3. Pain Management: Tylenol as needed 4. Mood/Behavior/Sleep: Provide emotional support             -antipsychotic agents: N/A 5. Neuropsych/cognition: This patient is not capable of making decisions on her own behalf. 6. Skin/Wound Care: Routine skin checks 7. Fluids/Electrolytes/Nutrition: Routine in and outs with follow-up chemistries- fluid intake has been poor check BMET in am   -1/17 CR and BUN improved today 8.  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  Follow-up outpatient cardiology.  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  CKD.  Baseline creatinine 1.20-1.30.  Follow-up chemistries on admit.repeat BMET in  am   -1/17 CR down to 0.95, improved 10.  Diastolic congestive heart failure.  Monitor for any signs of fluid overload.  Continue Entresto 24-26 mg twice daily             -daily weights, messaged RN to weigh patient on 1/6  -1/17 weights have been stable overall Filed Weights   06/23/22 0323 06/23/22 1810 06/24/22 0500  Weight: 78.5 kg 78.3 kg  78.3 kg    11.  Hypothyroidism.  continue Synthroid 12.  Hyperlipidemia.  continue Lipitor 13.  Constipation.  continue MiraLAX daily, Colace 100 mg twice daily  1/14- LBM last night- con't to monitor 14. Bradycardia: continue to monitor HR TID. F/u with cardiology regarding amiodarone dosing if symptomatic   15.  RSV- on droplet prec, no cough , SOB , or resp distress noted on exam today.  Has been afebrile, no cough thus far this am.  Will cont supportive care.  Droplet /contact precautions- able to d/c precautions 1/15   Pt afeb no cough   16. Anemia  -1/17 HGB up to 11.3 17. Knee pain  -1/17 tylenol scheduled, recheck xray knee   LOS: 13 days A FACE TO FACE EVALUATION WAS PERFORMED  Jennye Boroughs 06/24/2022, 8:28 AM

## 2022-06-24 NOTE — Progress Notes (Signed)
Physical Therapy Session Note  Patient Details  Name: Rebekah Pope MRN: [id number] Date of Birth: 24-Nov-1940  Today's Date: 06/24/2022 PT Individual Time: 1535-1650 PT Individual Time Calculation (min): 75 min   Short Term Goals: Week 2:  PT Short Term Goal 1 (Week 2): Pt will perform supine<>sit with supervision using bed features per home set-up PT Short Term Goal 2 (Week 2): Pt will perform sit<>stands using LRAD with CGA PT Short Term Goal 3 (Week 2): Pt will perform bed<>chair transfers using LRAD with CGA PT Short Term Goal 4 (Week 2): Pt will ambulate at least 18ft using LRAD with CGA PT Short Term Goal 5 (Week 2): Pt will ambulate up/down ramp using LRAD with min assist  Skilled Therapeutic Interventions/Progress Updates:    Pt received sitting in w/c and agreeable to therapy session. Per nurse, pt refused her Tylenol so therapist educated pt on plan to participate in [place] activities and educated pt on importance of premedicating to allow increased participation in therapy to focus on increasing her independence with functional mobility in preparation for D/C home next week.  Nurse present for medication administration. Pt already wearing R knee sleeve to assist with pain management.   Transported to/from gym in w/c for time management and energy conservation.  Sit>stand w/c>B UE support on litegait handles with mod assist for lifting to stand and pt continuing to report onset of R knee pain while rising up, but able to tolerate standing long enough for therapist to finish donning litegait harness.  Performed gait training in litegait harness providing significant BWS to off-weight pt's R knee for pain management - 153ft x2 (standing break between to avoid pain increasing with sit<>stand transfer) using B UE support on litegait handles and therapist managing the litegait machine - pt able to achieve reciprocal stepping pattern with only decreased R foot clearance in swing  noted with fatigue and pt able to correct with verbal cuing.  Standing with B UE support on litegait handles doffed harness. Pt again reports onset of R knee pain while lowering to sit.  L lateral scoot w/c>EOM with light mod assist for lifting hips to promote increased clearance. Sit>supine with supervision.   Performed the following supine exercises:  - bridging 10 + 15 reps with cuing for improved glute activation - hooklying hip abduction against level 3 green theraband resistance 2x15reps - bridging with hip abduction using level 3 green theraband x10 reps - supine short arc quads on bolster x15reps - reports onset of some pain with this but not as significant as during WBing  Supine>sitting from mat with light min assist for bringing trunk upright.  R lateral scoot EOM>w/c with light mod assist again for lifting hips to achieve clearance. Transported back to her room and pt left seated in w/c with needs in reach and seat belt alarm on.  Therapy Documentation Precautions:  Precautions Precautions: Fall, Other (comment) Precaution Comments: expressive aphasia Restrictions Weight Bearing Restrictions: No   Pain:  R knee pain - medication provided by nurse and modifications to interventions for pain management.   Therapy/Group: Individual Therapy  Tawana Scale , PT, DPT, NCS, CSRS 06/24/2022, 3:52 PM

## 2022-06-24 NOTE — Progress Notes (Addendum)
Physical Therapy Session Note  Patient Details  Name: Rebekah Pope MRN: [id number] Date of Birth: May 11, 1941  Today's Date: 06/24/2022 PT Individual Time: 0920-1032 PT Individual Time Calculation (min): 72 min   Short Term Goals: Week 2:  PT Short Term Goal 1 (Week 2): Pt will perform supine<>sit with supervision using bed features per home set-up PT Short Term Goal 2 (Week 2): Pt will perform sit<>stands using LRAD with CGA PT Short Term Goal 3 (Week 2): Pt will perform bed<>chair transfers using LRAD with CGA PT Short Term Goal 4 (Week 2): Pt will ambulate at least 57ft using LRAD with CGA PT Short Term Goal 5 (Week 2): Pt will ambulate up/down ramp using LRAD with min assist  Skilled Therapeutic Interventions/Progress Updates:    Pt received supine in bed awake and continues to have expressive>receptive aphasia. Pt agreeable to therapy session and communicates that she has had an accident in the bed. Pt noted to be incontinent of bowels. NT notified to assist for time management. Rolling R/L in bed using bedrails with supervision during dependent LB clothing management and peri-care.  Supine>sitting L EOB, HOB flat and using bedrail, with min assist to bring trunk upright and cuing for sequencing logroll technique for increased pt independence. Sitting EOB donned clean shirt set-up assist, threaded on pants min assist, donned TED hose total assist, and shoes max/total assist. (Addendum: donned R knee sleeve total assist for pain management). Pt reports she is becoming incontinent of her bowels again.  Sit>stand significantly elevated EOB>stedy with min assist for lifting to stand. Stedy transfer in/out bathroom - when lowering to sit on BSC over toilet from standing in stedy, this causes significant R knee pain. Pt had started to be incontinent of bowels again and is further continent on toilet as well as continent of bladder. Sit>stand BSC over toilet>stedy with +2 mod assist for  lifting to stand and then standing with CGA during dependent LB clothing management and peri-care. Pt continues to have increased knee pain when lowering from standing in stedy to sitting in w/c.  Sitting in w/c at sink, performed face hygiene and oral care with setup assist.   Transported to/from gym in w/c for time management and energy conservation.  Sitting in w/c performed 2x10 reps R ankle DF against level 3 green theraband resistance to see if this would mitigate some pain and pt reporting it "feels good."  Sit>stand w/c>RW x1 with heavy mod assist for lifting to stand and pt taking increased time to initiate this movement and rise to standing due to significant R knee pain - in standing pt stays with forward trunk and R knee flexed posture with off-weight towards standing on L LE. Standing with B UE support on RW performed R hip/knee flexion "march" movement to provide gentle R knee AROM but no improvement in symptoms. Attempted to have pt perform standing R knee TKE (terminal knee extension) but pt unable to motor plan/sequence how to activate quads and pain was no longer tolerable at this time so returned to sitting.  Performed R knee long arc quad AROM x10 reps.  Pt agreeable to attempt coming to stand again but pt very hesitant and no able to lift hips using RW due to significant R knee pain and anticipation of increased pain upon coming to stand. Therapist placed chair in front of pt to focus on increased anterior trunk hinge and bringing weight forward onto feet to come to stand with pt able to lift hips with  heavy mod assist but unable to transition to upright standing and requesting to return to sitting due to pain.  At end of session, pt left seated in w/c with needs in reach and seat belt alarm on.  Therapy Documentation Precautions:  Precautions Precautions: Fall, Other (comment) Precaution Comments: expressive aphasia Restrictions Weight Bearing Restrictions: No   Pain:   Significant R knee pain  notified nurse for pain medication administration - will discuss pain management options further in team conference today with MD.  Therapy/Group: Individual Therapy  Tawana Scale , PT, DPT, NCS, CSRS 06/24/2022, 7:54 AM

## 2022-06-24 NOTE — Progress Notes (Signed)
Patient ID: Rebekah Pope, female   DOB: 04/17/1941, 82 y.o.   MRN: [id number]  TTB ordered through [employer].

## 2022-06-25 ENCOUNTER — Inpatient Hospital Stay (HOSPITAL_COMMUNITY): Payer: Medicare PPO

## 2022-06-25 ENCOUNTER — Encounter (HOSPITAL_COMMUNITY): Payer: Self-pay

## 2022-06-25 DIAGNOSIS — K5903 Drug induced constipation: Secondary | ICD-10-CM

## 2022-06-25 DIAGNOSIS — R401 Stupor: Secondary | ICD-10-CM

## 2022-06-25 DIAGNOSIS — R569 Unspecified convulsions: Secondary | ICD-10-CM

## 2022-06-25 DIAGNOSIS — M1711 Unilateral primary osteoarthritis, right knee: Secondary | ICD-10-CM

## 2022-06-25 DIAGNOSIS — I6381 Other cerebral infarction due to occlusion or stenosis of small artery: Principal | ICD-10-CM | POA: Diagnosis present

## 2022-06-25 DIAGNOSIS — R4182 Altered mental status, unspecified: Secondary | ICD-10-CM

## 2022-06-25 LAB — CBC
HCT: 34.8 % — ABNORMAL LOW (ref 36.0–46.0)
Hemoglobin: 11.7 g/dL — ABNORMAL LOW (ref 12.0–15.0)
MCH: 29.3 pg (ref 26.0–34.0)
MCHC: 33.6 g/dL (ref 30.0–36.0)
MCV: 87.2 fL (ref 80.0–100.0)
Platelets: 216 10*3/uL (ref 150–400)
RBC: 3.99 MIL/uL (ref 3.87–5.11)
RDW: 13.5 % (ref 11.5–15.5)
WBC: 7.7 10*3/uL (ref 4.0–10.5)
nRBC: 0 % (ref 0.0–0.2)

## 2022-06-25 LAB — URINALYSIS, ROUTINE W REFLEX MICROSCOPIC
Bilirubin Urine: NEGATIVE
Glucose, UA: NEGATIVE mg/dL
Hgb urine dipstick: NEGATIVE
Ketones, ur: NEGATIVE mg/dL
Nitrite: POSITIVE — AB
Protein, ur: NEGATIVE mg/dL
Specific Gravity, Urine: 1.017 (ref 1.005–1.030)
pH: 5 (ref 5.0–8.0)

## 2022-06-25 LAB — BASIC METABOLIC PANEL
Anion gap: 8 (ref 5–15)
BUN: 17 mg/dL (ref 8–23)
CO2: 26 mmol/L (ref 22–32)
Calcium: 8.7 mg/dL — ABNORMAL LOW (ref 8.9–10.3)
Chloride: 104 mmol/L (ref 98–111)
Creatinine, Ser: 1.04 mg/dL — ABNORMAL HIGH (ref 0.44–1.00)
GFR, Estimated: 53 mL/min — ABNORMAL LOW (ref >60)
Glucose, Bld: 116 mg/dL — ABNORMAL HIGH (ref 70–99)
Potassium: 3.9 mmol/L (ref 3.5–5.1)
Sodium: 138 mmol/L (ref 135–145)

## 2022-06-25 LAB — BLOOD GAS, ARTERIAL
Acid-Base Excess: 3.2 mmol/L — ABNORMAL HIGH (ref 0.0–2.0)
Bicarbonate: 27.9 mmol/L (ref 20.0–28.0)
Drawn by: 51185
O2 Saturation: 97.9 %
Patient temperature: 36.4
pCO2 arterial: 41 mmHg (ref 32–48)
pH, Arterial: 7.44 (ref 7.35–7.45)
pO2, Arterial: 78 mmHg — ABNORMAL LOW (ref 83–108)

## 2022-06-25 LAB — GLUCOSE, CAPILLARY
Glucose-Capillary: 115 mg/dL — ABNORMAL HIGH (ref 70–99)
Glucose-Capillary: 105 mg/dL — ABNORMAL HIGH (ref 70–99)

## 2022-06-25 MED ORDER — ACETAMINOPHEN 325 MG PO TABS: 650.0000 mg | ORAL_TABLET | ORAL | Status: DC | PRN

## 2022-06-25 MED ORDER — LEVETIRACETAM 250 MG PO TABS: 500.0000 mg | ORAL_TABLET | Freq: Two times a day (BID) | ORAL | Status: DC

## 2022-06-25 MED ORDER — SACUBITRIL-VALSARTAN 24-26 MG PO TABS: 1.0000 | ORAL_TABLET | Freq: Two times a day (BID) | ORAL | Status: DC

## 2022-06-25 MED ORDER — APIXABAN 5 MG PO TABS: 5.0000 mg | ORAL_TABLET | Freq: Two times a day (BID) | ORAL | Status: DC

## 2022-06-25 MED ORDER — POLYETHYLENE GLYCOL 3350 17 G PO PACK: 17.0000 g | PACK | Freq: Every day | ORAL | Status: DC | PRN

## 2022-06-25 MED ORDER — ONDANSETRON HCL 4 MG/2ML IJ SOLN: 4.0000 mg | Freq: Four times a day (QID) | INTRAMUSCULAR | Status: DC | PRN

## 2022-06-25 MED ORDER — SODIUM CHLORIDE 0.9 % IV SOLN
1750.0000 mg | Freq: Once | INTRAVENOUS | Status: AC
  Administered 2022-06-25: 1750 mg via INTRAVENOUS
  Filled 2022-06-25: qty 17.5

## 2022-06-25 MED ORDER — DOCUSATE SODIUM 100 MG PO CAPS: 100.0000 mg | ORAL_CAPSULE | Freq: Two times a day (BID) | ORAL | Status: DC | PRN

## 2022-06-25 MED ORDER — BUPIVACAINE HCL (PF) 0.5 % IJ SOLN
10.0000 mL | Freq: Once | INTRAMUSCULAR | Status: DC
  Filled 2022-06-25 (×2): qty 10

## 2022-06-25 MED ORDER — KCL IN DEXTROSE-NACL 20-5-0.45 MEQ/L-%-% IV SOLN
INTRAVENOUS | Status: DC
  Filled 2022-06-25 (×3): qty 1000

## 2022-06-25 MED ORDER — LABETALOL HCL 5 MG/ML IV SOLN: 20.0000 mg | INTRAVENOUS | Status: DC | PRN

## 2022-06-25 MED ORDER — ASPIRIN 81 MG PO TBEC: 81.0000 mg | DELAYED_RELEASE_TABLET | Freq: Every day | ORAL | Status: DC

## 2022-06-25 MED ORDER — METHYLPREDNISOLONE ACETATE 40 MG/ML IJ SUSP
40.0000 mg | Freq: Once | INTRAMUSCULAR | Status: DC
  Filled 2022-06-25: qty 1

## 2022-06-25 NOTE — IPAL (Signed)
  Interdisciplinary Goals of Care Family Meeting   Date carried out:: 06/25/2022  Location of the meeting: Bedside  Member's involved: Physician, Bedside Registered Nurse, and Family Member or next of kin  [place] or acting medical decision maker: Husband Rebekah Pope    Discussion: We discussed goals of care for Rebekah Pope .  We discussed the fact that her overall condition is guarded due to multiple recent strokes and now a new stroke which has left her somnolent with severe weakness.  Risk of intubation is high.  We discussed what to do in the event of a cardiac arrest and the fact that if she were to receive CPR the likelihood of recovery would be low.  After further discussion with her son, daughter in law and husband they elected to change her code status to DNAR, short term intubation OK, no CPR In the event of a cardiac arrest.  Code status: DNAR  If patient has no pulse and is not breathing Do Not Attempt Resuscitation  If patient has a pulse and/or is breathing: Medical Treatment Goals MEDICAL INTERVENTIONS DESIRED: Use advanced airway interventions, mechanical ventilation or cardioversion in appropriate circumstances; Use medication/IV fluids as indicated; Provide comfort medications; Transfer to Progressive/Stepdown/ICU as indicated.    Disposition: Continue current acute care transfer to ICU for further management   Time spent for the meeting: 20 minutes  Roselie Awkward 06/25/2022, 8:33 PM

## 2022-06-25 NOTE — Progress Notes (Signed)
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 Artery of percheron stroke is rare but does typically present as AMS/somnolence.  Resume Eliquis 5mg  BID.  [facility] Pager Number [phone]

## 2022-06-25 NOTE — Progress Notes (Signed)
Patient returned to rehab following MRI and assigned nurse notified of results. This RN called on-call neurologist who will collaborate to transfer patient to acute. Reported to oncoming Designer, multimedia. Rapid Response RN also made aware.

## 2022-06-25 NOTE — Progress Notes (Signed)
PROGRESS NOTE   Subjective/Complaints: Continues to have R knee pain with therapy.  No additional concerns elicited.   ROS-limited by aphasia    Objective:   DG Knee Complete 4 Views Right  Result Date: 06/24/2022 CLINICAL DATA:  Right knee pain EXAM: RIGHT KNEE - COMPLETE 4+ VIEW COMPARISON:  None Available. FINDINGS: Frontal, bilateral oblique, lateral views of the right knee are obtained. No fracture, subluxation, or dislocation. 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 No joint effusion. Soft tissues are unremarkable. IMPRESSION: 1. 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 2. No acute bony abnormality. Electronically Signed   By: Sharlet Salina M.D.   On: 06/24/2022 15:21   Recent Labs    06/24/22 0733  WBC 8.0  HGB 11.3*  HCT 33.0*  PLT 224     Recent Labs    06/24/22 0733  NA 139  K 3.7  CL 105  CO2 27  GLUCOSE 101*  BUN 12  CREATININE 0.95  CALCIUM 8.8*      Intake/Output Summary (Last 24 hours) at 06/25/2022 0811 Last data filed at 06/24/2022 1730 Gross per 24 hour  Intake 477 ml  Output --  Net 477 ml         Physical Exam: Vital Signs Blood pressure (!) 147/71, pulse (!) 46, temperature 98 F (36.7 C), temperature source Oral, resp. rate 18, height 5\' 3"  (1.6 m), weight 81.2 kg, SpO2 97 %.   General: No acute distress Mood and affect are appropriate Heart: Regular rate and rhythm no rubs murmurs or extra sounds Lungs: Clear to auscultation, breathing unlabored, no rales or wheezes Abdomen: Positive bowel sounds, soft nontender to palpation, nondistended Extremities: No clubbing, cyanosis, or edema  Skin: No evidence of breakdown, no evidence of rash Neurologic: alert , follows simple commands, fluent aphasia, motor strength is 5/5 in left 4+/5 right  deltoid, bicep, tricep, grip, hip flexor, knee  extensors, ankle dorsiflexor and plantar flexor Sensory exam cannot assess aphasic  MSK: no significant knee joint line tenderness noted, pain with valgus stress on right knee    Assessment/Plan: 1. Functional deficits which require 3+ hours per day of interdisciplinary therapy in a comprehensive inpatient rehab setting. Physiatrist is providing close team supervision and 24 hour management of active medical problems listed below. Physiatrist and rehab team continue to assess barriers to discharge/monitor patient progress toward functional and medical goals  Care Tool:  Bathing    Body parts bathed by patient: Face, Right arm, Left arm, Chest, Abdomen, Right upper leg, Left upper leg, Front perineal area         Bathing assist Assist Level: Moderate Assistance - Patient 50 - 74%     Upper Body Dressing/Undressing Upper body dressing   What is the patient wearing?: Pull over shirt    Upper body assist Assist Level: Supervision/Verbal cueing Assistive Device Comment: cues to orient shirt  Lower Body Dressing/Undressing Lower body dressing      What is the patient wearing?: Pants, Incontinence brief     Lower body assist Assist for lower body dressing: Maximal Assistance - Patient 25 - 49%  Toileting Toileting    Toileting assist Assist for toileting: Maximal Assistance - Patient 25 - 49%     Transfers Chair/bed transfer  Transfers assist     Chair/bed transfer assist level: Moderate Assistance - Patient 50 - 74% (lateral scoot due to limited tolerance with pain) Chair/bed transfer assistive device: Armrests, Walker   Locomotion Ambulation   Ambulation assist   Ambulation activity did not occur:  (unable to participate in gait training since 1/13 due to R knee pain)  Assist level: Moderate Assistance - Patient 50 - 74% Assistive device: Parallel bars Max distance: 15   Walk 10 feet activity   Assist     Assist level: Moderate Assistance -  Patient - 50 - 74% Assistive device: Parallel bars   Walk 50 feet activity   Assist Walk 50 feet with 2 turns activity did not occur: Safety/medical concerns         Walk 150 feet activity   Assist Walk 150 feet activity did not occur: Safety/medical concerns         Walk 10 feet on uneven surface  activity   Assist Walk 10 feet on uneven surfaces activity did not occur: Safety/medical concerns         Wheelchair     Assist Is the patient using a wheelchair?: Yes Type of Wheelchair: Manual    Wheelchair assist level: Moderate Assistance - Patient 50 - 74% Max wheelchair distance: 100    Wheelchair 50 feet with 2 turns activity    Assist        Assist Level: Moderate Assistance - Patient 50 - 74%   Wheelchair 150 feet activity     Assist      Assist Level: Maximal Assistance - Patient 25 - 49%   Blood pressure (!) 147/71, pulse (!) 46, temperature 98 F (36.7 C), temperature source Oral, resp. rate 18, height 5\' 3"  (1.6 m), weight 81.2 kg, SpO2 97 %.  Medical Problem List and Plan: 1. 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  Antithrombotics: -DVT/anticoagulation: Eliquis 5mg  BID - monitor CBC check in am            -antiplatelet therapy: N/A 3. Pain Management: Tylenol as needed 4. Mood/Behavior/Sleep: Provide emotional support             -antipsychotic agents: N/A 5. Neuropsych/cognition: This patient is not capable of making decisions on her own behalf. 6. Skin/Wound Care: Routine skin checks 7.  Fluids/Electrolytes/Nutrition: Routine in and outs with follow-up chemistries- fluid intake has been poor check BMET in am   -1/17 CR and BUN improved today 8.  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  Follow-up outpatient cardiology.  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  CKD.  Baseline creatinine 1.20-1.30.  Follow-up chemistries on admit.repeat BMET in am   -1/17 CR down to 0.95, improved 10.  Diastolic congestive heart failure.  Monitor for any signs of fluid overload.  Continue Entresto 24-26 mg twice daily             -daily weights, messaged RN to weigh patient on 1/6  -1/18 weight a little up, continue to monitor, no signs of overload Filed Weights   06/23/22 1810 06/24/22 0500 06/25/22 0355  Weight: 78.3 kg 78.3 kg 81.2 kg    11.  Hypothyroidism.  continue Synthroid 12.  Hyperlipidemia.  continue Lipitor 13.  Constipation.  continue MiraLAX daily, Colace 100 mg twice daily  1/18 LBM yesterday liquid , continue to monitor, change miralax to prn 14. Bradycardia: continue to monitor HR TID. F/u with cardiology regarding amiodarone dosing if symptomatic   15.  RSV- on droplet prec, no cough , SOB , or resp distress noted on exam today.  Has been afebrile, no cough thus far this am.  Will cont supportive care.  Droplet /contact precautions- able to d/c precautions 1/15   Pt afeb no cough   16. Anemia  -1/17 HGB up to 11.3 17. Knee pain  -1/17 tylenol scheduled, recheck xray knee   -Consult ortho for possible knee injection  LOS: 14 days A FACE TO FACE EVALUATION WAS PERFORMED  Jennye Boroughs 06/25/2022, 8:11 AM

## 2022-06-25 NOTE — Progress Notes (Signed)
Patient ID: Rebekah Pope, female   DOB: April 30, 1941, 82 y.o.   MRN: [id number]  Orthopedics awaiting pt readiness for knee injection. Unfortunately not ready in timely manner for today. Will hope for tomorrow.    Lisette Abu, PA-C Orthopedic Surgery [phone]

## 2022-06-25 NOTE — Progress Notes (Signed)
SLP Cancellation Note  Patient Details Name: [name] MRN: [id number] DOB: 1940-12-24   Cancelled treatment:      SLP attempted to see pt for scheduled ST; however, pt unavailable and receiving procedure by nursing, and will not be available for the remainder of the afternoon per RN. Therefore, pt missed 60 minutes of scheduled ST intervention. Will continue efforts to make-up time, as able.   Aiyla Baucom A Afshin Chrystal 06/25/2022, 2:05 PM

## 2022-06-25 NOTE — Progress Notes (Signed)
Physical Therapy Session Note  Patient Details  Name: Rebekah Pope MRN: [id number] Date of Birth: [birth year]  Today's Date: 06/25/2022 PT Individual Time: 9518-8416 PT Individual Time Calculation (min): 58 min   Short Term Goals: Week 2:  PT Short Term Goal 1 (Week 2): Pt will perform supine<>sit with supervision using bed features per home set-up PT Short Term Goal 2 (Week 2): Pt will perform sit<>stands using LRAD with CGA PT Short Term Goal 3 (Week 2): Pt will perform bed<>chair transfers using LRAD with CGA PT Short Term Goal 4 (Week 2): Pt will ambulate at least 11ft using LRAD with CGA PT Short Term Goal 5 (Week 2): Pt will ambulate up/down ramp using LRAD with min assist  Skilled Therapeutic Interventions/Progress Updates:    Pt received supine in bed awake and agreeable to therapy session. Supine>sitting L EOB, HOB elevated to 30degrees but not using handrails, with supervision and increased effort/time. Sitting EOB donned TED hose and tennis shoes total assist for time management. Pt requesting to perform oral care. R lateral scoot transfer EOB>w/c with heavy min assist for lifting/pivoting hips - performed this type of transfer to avoid increased R knee pain. Sitting in w/c at sink, performed oral care and face hygiene set-up assist.   Transported to/from gym in w/c for time management and energy conservation.  Sit>stand w/c>B UE support on litegait handles with mod assist for lifting to stand and pt continuing to report onset of significant R knee pain when rising up, but able to tolerate standing long enough for therapist to finish donning litegait harness.   Performed gait training in litegait harness providing significant BWS to off-weight pt's R knee for pain management - 132ft x2 (standing break between to avoid pain increasing with sit<>stand transfer) using B UE support on litegait handles and therapist managing the litegait machine - pt able to achieve reciprocal  stepping pattern with only decreased R foot clearance in swing noted with fatigue and pt able to correct with verbal cuing.   Standing with B UE support on litegait handles doffed harness. Pt again reports onset of R knee pain while lowering to sit. Transported back to her room ant pt left seated in w/c with needs in reach and seat belt alarm on.  Therapy Documentation Precautions:  Precautions Precautions: Fall, Other (comment) Precaution Comments: expressive aphasia Restrictions Weight Bearing Restrictions: No   Pain:  R knee pain that increases with WBing activities - premedicated - modified interventions for pain management.   Therapy/Group: Individual Therapy  Tawana Scale , PT, DPT, NCS, CSRS 06/25/2022, 7:57 AM

## 2022-06-25 NOTE — Progress Notes (Signed)
[facility] PCCM  Arrived to 53M Still stirs to voice, touch Has gag on my exam No respiratory distress Has bradycardia, chronic  Keep HOB > 30 degrees Monitor respiratory status closely If she develops any respiratory distress will need intubation Likely needs [place] in AM for meds, will order Tele  Roselie Awkward, MD [place] [facility] Pager: [phone] Cell: [phone] After 7:00 pm call Elink  [phone]

## 2022-06-25 NOTE — Progress Notes (Signed)
By report patient was sitting up in her wheelchair became unresponsive per husband.  This was not witnessed by staff.  With assistance patient was placed back in the bed blood pressure 132/98.  She did respond to sternal rub.  There was some question of possible seizure.  Rapid response was contacted.  Cranial CT scan negative chest x-ray unremarkable.  Chemistries are pending.  Spoke with neurology services patient loaded with Keppra EEG has been ordered and neurology team to follow-up.

## 2022-06-25 NOTE — Progress Notes (Signed)
EEG complete - results pending 

## 2022-06-25 NOTE — Significant Event (Signed)
Rapid Response Event Note   Reason for Call : Decreased LOC Initial Focused Assessment:  I was notified by Rehab staff regarding Ms. Mcgough after her return from MRI for decreased LOC/somulence. Upon arrival, family at bedside. Ms. Macaulay withdraws to pain from noxious stimuli, intact gag reflex, shallow sonorous respirations. MRI shows new percheron infarcts and Right cerebellar infarcts. Discussed situation with Risa Grill PA and Dr. Lorrin Goodell. Consults to be made for transfer back to acute side of hospital.   1939-98.0 F, HR 35 (baseline), 143/53, RR 16 with sats 99% on 2LNC. CBG 105      Plan of Care:  -Tx to ICU per PCCM  MD Notified: Risa Grill PA  Call Time: 7588 Arrival Time: 1940 End Time: 2027   Madelynn Done, RN

## 2022-06-25 NOTE — Progress Notes (Addendum)
Patients husband at nursing desk stating that his wife wound not responding to him. Writer immediately went to patients room annd observed patient up in her WC , arms hanging down, and head to the right side. Called a second nurse to call Dan A PA and call a rapid. VS obtained. Patient VS all WNL for patient. Patient continues to be lethargic and unresponsive. Patient jerks to painful stimuli only. New orders obtained and processed. Loading dose of Keppra  started by Rapid Nurse   Wilburn Cornelia. Patient remains lethargic. VS continue, husband at bedside.

## 2022-06-25 NOTE — Significant Event (Addendum)
Rapid Response Event Note   Reason for Call :  Somnolence  Initial Focused Assessment:  Pt lying in bed, withdrawals to pain. Pt moving all extremities equally.  Pupils are equal and round, 34mm. Lung sounds are clear. She is noted to have upper airway congestion. Snorous respirations. Breathing is unlabored. Skin is warm, dry, pink.   VS: T 97.80F, BP 178/50, HR 42, RR 22, SpO2 100% on room air CBG: 115  Interventions:  -CBG -CT head -CXR -BMP, CBC -UA, UC -ABG 7.44/ 41/ 78/ 27.9, 2LNC applied -EEG  Plan of Care:  -Neurology consulted by rehab team -Keppra load and MRI ordered, per Neurology -Continue to monitor  Call rapid response for additional needs  Event Summary:  MD Notified: D. [place], PA Call Time: [place] Time: [place] Time: [place], RN

## 2022-06-25 NOTE — Progress Notes (Signed)
Occupational Therapy Session Note  Patient Details  Name: Rebekah Pope MRN: [id number] Date of Birth: 1940-09-29  Today's Date: 06/25/2022 OT Individual Time: 1103-1200 OT Individual Time Calculation (min): 57 min    Short Term Goals: Week 2:  OT Short Term Goal 1 (Week 2): Pt will complete LB dressing min A- continue OT Short Term Goal 2 (Week 2): Pt will complete toilet transfer min A using LRAD- continue OT Short Term Goal 3 (Week 2): Pt will complete sit>stand for BADLs min A using LRAD- continue  Skilled Therapeutic Interventions/Progress Updates:     AM Session: Pt received sitting up in wc presenting to be in good spirits and receptive to skilled OT session. Pt reporting 0/10 pain in knee at rest, however 10/10 pain in weight bearing during session- modifications, rest breaks, repositioning, and AAROM provided to decrease pain as Pt had received pain medications from Rn prior to session.  Pt requesting to use restroom at beginning of session. Pt receptive to attempting to ambulate to bathroom using RW. Pt sit>stand mod A. Pt able to ambulate ~3 feet with min A and mod motivation provided, however was instructed to sit in wc to be transported to bathroom d/t significant increase in pain. Pt completed squat pivot transfer to [facility] over toilet is mod A and mod cues for technique and body positioning. Pt max A to doff pants in partial stand using BUE to lift bottom from [facility]. Pt able to complete anterior peri-care with mod cues for technique d/t positioning on BSC (continent void in toilet). Pt and OT discussed/problem solved LB dressing techniques to increase safety and decrease pain upon d/c home. Pt able to lift bottom from commode using BUE and anteriorly weight shifting. Pants donned max A. Pt transported to sink to complete hand washing and grooming/hygiene tasks.  Pt and OT discussed importance of completing LB ARROM while seated in wc to decrease pain in knees with Pt receptive to  education. OT educated Pt on wc mobility and operations to increase independence at wc level d/t potential for Pt using wc at d/c. Pt practiced propelling w/c initially requiring max verbal cues and mod A to learn technique d/t new learning task. Pt provided increased time for practice transitioning to requiring min-mod A and mod cues for technique. Pt was left resting in wc with call bell in reach, seat belt alarm on, and all needs met.   PM Session: Received message from treating SLP that Pt is unable to be seen for therapy sessions this afternoon d/t upcoming chart review. Per chart review,  rapid response called on Pt d/t Pt becoming unresponsive. Suspected potential seizure. Did not attempt to see Pt d/t Pt on hold for MR Brain. Missed 30 minutes of skilled OT treatment.    Therapy Documentation Precautions:  Precautions Precautions: Fall, Other (comment) Precaution Comments: expressive aphasia Restrictions Weight Bearing Restrictions: No General:   Vital Signs:  Pain:   ADL: ADL Eating: Minimal assistance Where Assessed-Eating: Edge of bed Grooming: Minimal assistance Where Assessed-Grooming: Wheelchair Upper Body Bathing: Minimal assistance Where Assessed-Upper Body Bathing: Shower Lower Body Bathing: Moderate assistance Where Assessed-Lower Body Bathing: Shower Upper Body Dressing: Minimal assistance Where Assessed-Upper Body Dressing: Wheelchair Lower Body Dressing: Moderate assistance Where Assessed-Lower Body Dressing: Wheelchair Toileting: Moderate assistance Where Assessed-Toileting: Glass blower/designer: Moderate assistance Toilet Transfer Method: Counselling psychologist: Grab bars, Raised toilet seat Tub/Shower Transfer: Not assessed Social research officer, government: Moderate assistance Social research officer, government Method: Event organiser  seat with back Vision   Perception    Praxis   Balance   Exercises:   Other  Treatments:     Therapy/Group: Individual Therapy  Janey Genta 06/25/2022, 11:43 AM

## 2022-06-25 NOTE — H&P (Signed)
NAME:  Rebekah Pope, MRN:  [id number], DOB:  1940-09-28, LOS: 63 ADMISSION DATE:  06/11/2022, CONSULTATION DATE:  06/25/22 REFERRING MD:  Rebekah Pope, CHIEF COMPLAINT:  Change in mental status   History of Present Illness:  82 y/o female with a recent history of strokes in December was in CIR this evening when she had a sudden change in mental status and head imaging confirmed new Percheron infarcts medial thalami and new inferior right cerebellar strokes.  PCCM consulted given change in mental status and concern for airway protection. The patient could not provide history on my evaluation due to the change in her mental status.  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 Burt Pope & Dr. 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 rate 16, height 5\' 3"  (1.6 m), weight 81.2 kg, SpO2 99 %.        Intake/Output Summary (Last 24 hours) at 06/25/2022 2235 Last data filed at 06/25/2022 0831 Gross per 24 hour  Intake 118 ml  Output --  Net 118 ml   Filed Weights   06/23/22 1810 06/24/22 0500 06/25/22 0355  Weight: 78.3 kg 78.3 kg 81.2 kg    Examination:  General:  Resting comfortably in bed, snoring HENT: NCAT OP clear PULM: CTA B, normal effort CV: RRR, no mgr GI: BS+, soft, nontender MSK: normal bulk and tone Neuro: asleep, doesn't stir to voice, has gag reflex, withdraws to pain in all four extremities   Resolved Hospital Problem list   RSV infection  Assessment & Plan:  Acute bilateral thalamic infarct, picture worrisome for artery of 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 and treated with thrombectomy Aphasia, gait disturbance after recent strokes prior to 1/18 stroke Admit to ICU Secondary stroke prevention per neurology Goal glucose 140-180 Goal SBP < 220 for first 24 hours, then < 140/90 in 24-48 hours PT/OT/SLP   Acute encephalopathy due to posterior circulation stroke Aspiration precautions Monitor airway and respiratory status in ICU  May need  intubation NPO  Diastolic heart failure Permanant atrial fibrillation S/p TAVR 92023 Amiodarone Eliquis Tele Monitor volume status  Hypothyroidism Synthroid  Hyperlipidemia Lipitor  Constipation  Miralax scheduled, colace  Anemia without bleeding Monitor for bleeding Transfuse PRBC for Hgb < 7 gm/dL     Best Practice (right click and "Reselect all SmartList Selections" daily)   Diet/type: NPO DVT prophylaxis: DOAC GI prophylaxis: N/A Lines: N/A Foley:   N/A Code Status:  full code Last date of multidisciplinary goals of care discussion [1/18 Rebekah Pope with family, see iPAL note]  Labs   CBC: Recent Labs  Lab 06/24/22 0733 06/25/22 1518  WBC 8.0 7.7  HGB 11.3* 11.7*  HCT 33.0* 34.8*  MCV 86.4 87.2  PLT 224 939    Basic Metabolic Panel: Recent Labs  Lab 06/24/22 0733 06/25/22 1518  NA 139 138  K 3.7 3.9  CL 105 104  CO2 27 26  GLUCOSE 101* 116*  BUN 12 17  CREATININE 0.95 1.04*  CALCIUM 8.8* 8.7*   GFR: Estimated Creatinine Clearance: 41.3 mL/min (A) (by C-G formula based on SCr of 1.04 mg/dL (H)). Recent Labs  Lab 06/24/22 0733 06/25/22 1518  WBC 8.0 7.7    Liver Function Tests: No results for input(s): "AST", "ALT", "ALKPHOS", "BILITOT", "PROT", "ALBUMIN" in the last 168 hours. No results for input(s): "LIPASE", "AMYLASE" in the last 168 hours. No results for input(s): "AMMONIA" in the last 168 hours.  ABG    Component Value Date/Time   PHART 7.44 06/25/2022 1430   PCO2ART 41 06/25/2022 1430   PO2ART 78 (L) 06/25/2022 1430   HCO3 27.9 06/25/2022 1430   TCO2 22 06/04/2022 1146   ACIDBASEDEF 3.0 (H) 06/04/2022 1146   O2SAT 97.9 06/25/2022 1430     Coagulation Profile: No results for input(s): "INR", "PROTIME" in the last 168 hours.  Cardiac Enzymes: No results for input(s): "CKTOTAL", "CKMB", "CKMBINDEX", "TROPONINI" in the last 168 hours.  HbA1C: Hgb A1c MFr Bld  Date/Time Value Ref Range Status  05/10/2022 05:11 AM 6.0 (H) 4.8 - 5.6 % Final    Comment:    (NOTE)         Prediabetes: 5.7 - 6.4         Diabetes: >6.4         Glycemic control for adults with diabetes: <7.0     CBG: Recent Labs  Lab 06/25/22 1315 06/25/22 2008  GLUCAP 115* 105*    Review of Systems:   Cannot obtain due to stroke  Past Medical History:  She,  has a past medical history of Allergic rhinitis, CAD (coronary artery disease), Chronic diastolic CHF (congestive heart failure) ([place]), Chronic kidney disease,  stage 3b ([place]), Complex regional pain syndrome i of unspecified lower limb, Dizziness and giddiness, Essential (primary) hypertension, History of hiatal hernia, Hyperlipidemia, Hypothyroidism, LBBB (left bundle branch block), Localized edema, Paroxysmal atrial fibrillation ([place]), PONV (postoperative nausea and vomiting), S/P TAVR (transcatheter aortic valve replacement) (02/10/2022), Severe aortic stenosis, Sinus bradycardia, and Stroke (cerebrum) ([place]).   Surgical History:   Past Surgical History:  Procedure Laterality Date   CESAREAN SECTION     INTRAOPERATIVE TRANSTHORACIC ECHOCARDIOGRAM N/A 02/10/2022   Procedure: INTRAOPERATIVE TRANSTHORACIC ECHOCARDIOGRAM;  Surgeon: Sherren Mocha, MD;  Location: [facility];  Service: Open Heart Surgery;  Laterality: N/A;   IR ANGIO VERTEBRAL SEL VERTEBRAL UNI L MOD SED  06/04/2022   IR CT HEAD LTD  06/04/2022   IR PERCUTANEOUS ART THROMBECTOMY/INFUSION INTRACRANIAL INC DIAG ANGIO  06/04/2022  IR US GUIDE VASC ACCESS RIGHT  06/04/2022   RADIOLOGY WITH ANESTHESIA N/A 06/04/2022   Procedure: IR WITH ANESTHESIA;  Surgeon: Radiologist, Medication, MD;  Location: [facility] OR;  Service: Radiology;  Laterality: N/A;   RIGHT HEART CATH AND CORONARY ANGIOGRAPHY N/A 10/31/2021   Procedure: RIGHT HEART CATH AND CORONARY ANGIOGRAPHY;  Surgeon: Kathleene Hazel, MD;  Location: [facility] INVASIVE CV LAB;  Service: Cardiovascular;  Laterality: N/A;   TRANSCATHETER AORTIC VALVE REPLACEMENT, TRANSFEMORAL N/A 02/10/2022   Procedure: Transcatheter Aortic Valve Replacement, Transfemoral;  Surgeon: Tonny Bollman, MD;  Location: [facility] INV[facility] LAB;  Service: Open Heart Surgery;  Laterality: N/A;     Social History:   reports that she has never smoked. She has been exposed to tobacco smoke. She has never used smokeless tobacco. She reports that she does not drink alcohol and does not use drugs.   Family History:  Her family history includes Dementia in her mother; Heart  disease in her father; Hip fracture in her mother; Pneumonia in her mother.   Allergies No Known Allergies   Home Medications  Prior to Admission medications   Medication Sig Start Date End Date Taking? Authorizing Provider  acetaminophen (TYLENOL) 325 MG tablet Take 2 tablets (650 mg total) by mouth every 6 (six) hours as needed for mild pain, fever or headache. 05/19/22   Angiulli, Mcarthur Rossetti, PA-C  amiodarone (PACERONE) 200 MG tablet Take 0.5 tablets (100 mg total) by mouth daily. 05/19/22   Angiulli, Mcarthur Rossetti, PA-C  apixaban (ELIQUIS) 5 MG TABS tablet Take 1 tablet (5 mg total) by mouth 2 (two) times daily. 05/19/22   Angiulli, Mcarthur Rossetti, PA-C  atorvastatin (LIPITOR) 80 MG tablet Take 1 tablet (80 mg total) by mouth daily. 05/19/22   Angiulli, Mcarthur Rossetti, PA-C  ENTRESTO 24-26 MG Take 1 tablet by mouth 2 (two) times daily. 05/19/22   Angiulli, Mcarthur Rossetti, PA-C  hydrALAZINE (APRESOLINE) 25 MG tablet Take 1 tablet (25 mg total) by mouth 3 (three) times daily. 05/19/22   Angiulli, Mcarthur Rossetti, PA-C  levothyroxine (SYNTHROID) 112 MCG tablet Take 1 tablet (112 mcg total) by mouth daily before breakfast. 05/19/22   Angiulli, Mcarthur Rossetti, PA-C     Critical care time: 50 minutes    Heber [place], MD [place] P[facility]M Pager: [phone] Cell: [phone] After 7:00 pm call Elink  [phone]

## 2022-06-25 NOTE — Progress Notes (Addendum)
Inpatient Rehabilitation Discharge Medication Review by a Pharmacist  A complete drug regimen review was completed for this patient to identify any potential clinically significant medication issues.  High Risk Drug Classes Is patient taking? Indication by Medication  Antipsychotic No   Anticoagulant Yes Apixaban (Afib)  Antibiotic No   Opioid No   Antiplatelet Yes ASA (CVA)  Hypoglycemics/insulin No   Vasoactive Medication Yes Amiodarone (Afib) Entresto (CHF/HTN)  Chemotherapy No   Other Yes Atorvastatin (dyslipidemia) Diclofenac topical (pain) Docusate (constipation) Famotidine (GERD) Levetiracetam (seizure) Levothyroxine (hypothyroid) Miralax (constipation) Senna-docusate (constipation) Sorbitol (constipation)     Type of Medication Issue Identified Description of Issue Recommendation(s)  Drug Interaction(s) (clinically significant)     Duplicate Therapy     Allergy     No Medication Administration End Date     Incorrect Dose     Additional Drug Therapy Needed     Significant med changes from prior encounter (inform family/care partners about these prior to discharge).    Other  Pt started on Keppra for seizure Started on low-dose ASA for CVA     Clinically significant medication issues were identified that warrant physician communication and completion of prescribed/recommended actions by midnight of the next day:  No  Time spent performing this drug regimen review (minutes):  25   Wynona Neat, PharmD, BCPS  06/25/2022 11:41 PM

## 2022-06-25 NOTE — Progress Notes (Signed)
[facility] PCCM  Called to bedside for change in mental status with new cerebellar stroke. Recent history of strokes in December as well as January 4 involving L ICA and MCA requiring thrombectomy.  Today noted to have a new symmetric acute infarcts within medial thalami infarcts and R cerebellar infarct, suspicious for embolic phenomena. Also seen on MRI tonight are know small acute subacute infarcts in supratentorial brain involving multiple territories.   Currently protecting airway but very somnolent, needs ICU placement.  Discussed goals of care, see iPAL note.  Roselie Awkward, MD [place] PCCM Pager: [phone] Cell: [phone] After 7:00 pm call Elink  [phone]

## 2022-06-25 NOTE — Progress Notes (Addendum)
NEUROLOGY CONSULTATION PROGRESS NOTE   Date of service: June 25, 2022 Patient Name: Rebekah Pope MRN:  [id number] DOB:  [birth year]  Brief HPI  Rebekah Pope is a 82 y.o. 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 whom neurology is consulted for acute mental status change    Interval Hx  Husband reports that patient was sitting in her wheelchair this afternoon when she suddenly stopped talking and put her hands beside her head and stopped responding. He reports prior to that she was at her baseline. He does not note any shaking or jerking. He does not report any prior history of seizures. At baseline, she has aphasia but is able to communicate a little. Rapid response was called. Rehab PA also notes that after she came back from CT, her teeth were clenched and it was difficult to remove her dentures.    Per chart review, patient was recently discharged from stroke service to [facility] on 1/4 after an acute ischemic stroke 2/2 L terminal ICA and MCA occlusion s/p thrombectomy initially thought to be due to a thrombo-embolic event (initially there was concern for failed eliquis). Her deficit at that time was noted to be her aphasia. She was previously also seen by the stroke service early December for pontine stroke with subcortical infarcts thought to be due to small vessel disease. No new  significant medication changes in MAR. She is on eliquis for a fib.  Head CT no acute findings, personally reviewed by Dr. Raynelle [place]   Vitals:   06/25/22 1313 06/25/22 1317 06/25/22 1331 06/25/22 1427  BP: (!) 156/74 (!) 226/64 (!) 178/50 (!) 155/40  Pulse: (!) 42 (!) 42 (!) 42 (!) 40  Resp: 18   17  Temp: 98 F (36.7 C)   97.6 F (36.4 C)  TempSrc: Oral     SpO2:  97% 100% 97%  Weight:      Height:         Body mass index is 31.71 kg/m.  Physical Exam   Physical Exam Gen: obtunded, grimaces in response to noxious stimuli, does not follow commands Resp: sonorous respirations CV: RRR  Neuro: *MS: obtunded, grimaces in response to noxious stimuli, does not follow commands *Speech: no intelligible speech *CN: pupils 14mm ERRL, (+) corneals, oculocephalics, R facial droop *Motor & sensory: localizes to noxious stimuli RUE, withdraws to noxious stimuli in all other extremities. Some spontaneous kicking movements bilat *Reflexes: 2+ brisk L, 3+ R, toes withdrawal bilat *Coordination, Gait: UTA  NIHSS components Score: Comment  1a Level of Conscious 0[]  1[]  2[x]  3[]      1b LOC Questions 0[]  1[]  2[x]       1c LOC Commands 0[]  1[]  2[x]       2 Best Gaze 0[x]  1[]  2[]       3 Visual 0[x]  1[]  2[]  3[]      4 Facial Palsy 0[]  1[x]  2[]  3[]      5a Motor Arm - left 0[]  1[]  2[x]  3[]  4[]  UN[]    5b Motor Arm - Right 0[]  1[]  2[x]  3[]   4[]  UN[]    6a Motor Leg - Left 0[]  1[]  2[x]  3[]  4[]  UN[]    6b Motor Leg - Right 0[]  1[]  2[x]  3[]  4[]  UN[]    7 Limb Ataxia 0[x]  1[]  2[]  3[]  UN[]     8 Sensory 0[x]  1[]  2[]  UN[]      9 Best Language 0[]  1[]  2[]  3[x]      10 Dysarthria 0[]  1[]  2[x]  UN[]      11 Extinct. and Inattention 0[x]  1[]  2[]       TOTAL:     NIHSS = 21 in setting of likely post-ictal state   Labs   Basic Metabolic Panel:  Lab Results  Component Value Date   NA 138 06/25/2022   K 3.9 06/25/2022   CO2 26 06/25/2022   GLUCOSE 116 (H) 06/25/2022   BUN 17 06/25/2022   CREATININE  1.04 (H) 06/25/2022   CALCIUM 8.7 (L) 06/25/2022   GFRNONAA 53 (L) 06/25/2022   HbA1c:  Lab Results  Component Value Date   HGBA1C 6.0 (H) 05/10/2022   LDL:  Lab Results  Component Value Date   LDLCALC 51 06/05/2022   Urine Drug Screen:     Component Value Date/Time   LABOPIA NONE DETECTED 06/04/2022 1029   COCAINSCRNUR NONE DETECTED 06/04/2022 1029   LABBENZ NONE DETECTED 06/04/2022 1029   AMPHETMU NONE DETECTED 06/04/2022 1029   THCU NONE DETECTED 06/04/2022 1029   LABBARB NONE DETECTED 06/04/2022 1029    Alcohol Level     Component Value Date/Time   ETH <10 06/04/2022 0835   No results found for: "PHENYTOIN", "ZONISAMIDE", "LAMOTRIGINE", "LEVETIRACETA" No results found for: "PHENYTOIN", "PHENOBARB", "VALPROATE", "CBMZ"  Imaging and Diagnostic studies  Results for orders placed during the hospital encounter of 06/11/22  CT HEAD WO CONTRAST (5MM)  Narrative CLINICAL DATA:  Altered mental status, nontraumatic.  EXAM: CT HEAD WITHOUT CONTRAST  TECHNIQUE: Contiguous axial images were obtained from the base of the skull through the vertex without intravenous contrast.  RADIATION DOSE REDUCTION: This exam was performed according to the departmental dose-optimization program which includes automated exposure control, adjustment of the mA and/or kV according to patient size and/or use of iterative reconstruction technique.  COMPARISON:  Head CT 06/14/2022. Brain MRI 06/05/2022  FINDINGS: Brain: No evidence of acute infarction, hemorrhage, hydrocephalus, extra-axial collection or mass lesion/mass effect. Stable degree of atrophy and chronic small vessel ischemia. Small remote lacunar infarcts are present that are stable from prior exam.  Vascular: Skull base atherosclerosis. No acute vascular findings.  Skull: No acute findings.  Sinuses/Orbits: Increased mucosal thickening of ethmoid air cells with small fluid levels in both maxillary sinuses, new.  Occasional opacification of lower right mastoid air cells.  Other: None.  IMPRESSION: 1. No acute intracranial abnormality. 2. Stable atrophy and chronic small vessel ischemia. 3. Increased mucosal thickening of ethmoid air cells with small fluid levels in both maxillary sinuses, new from prior exam, may represent acute sinusitis in the appropriate clinical setting.   Electronically Signed By: Keith Rake M.D. On: 06/25/2022 13:55   Impression  Rebekah Pope is a 82 y.o. 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 She does not have prominent asymmetry on response to noxious stimuli and suspicion for LVO is  low. Her exam is slowly improving and she is able to move all extremities against gravity when withdrawing to noxious stimuli. She would not be a TNK candidate even in the event of a small new stroke given therapeutic anticoagulation with eliquis. We will perform MRI brain to r/o small acute infarct. Regardless of MRI findings starting an AED after single seizure is warranted given known recent stroke and therefore high risk of seizure recurrence  Recommendations   - Routine EEG, live recording reviewed at bedside by attending and she was not actively seizing on EEG at that time - Keppra 20mg /kg load followed by Keppra 500 Q12H   - MRI brain wo contrast - Continue eliquis if no acute stroke on MRI, neuro will review and touch base with primary team after results ______________________________________________________________________   Thank you for the opportunity to take part in the care of this patient. If you have any further questions, please contact the  neurology consultation attending.  , MD, PGY-1   Attending Neurohospitalist Addendum Patient seen and examined with APP/Resident. Agree with the history and physical as documented above. Agree with the plan as documented, which I helped formulate. I have edited the note above to reflect my full findings and recommendations. I have independently reviewed the chart, obtained history, review of systems and examined the patient.I have personally reviewed pertinent head/neck/spine imaging (CT/MRI). Please feel free to call with any questions.  -- Karie Fetch, MD [facility] [phone]  If 7pm- 7am, please page neurology on call as listed in AMION.  Addendum @ 1924:  MRI brain findings as follows:   1. New from the prior brain MRI of 06/05/2022, there are symmetric acute infarcts within the medial thalami (which are suspicious for artery of Percheron infarcts). 2. Subcentimeter acute infarct within the inferior right cerebellar hemisphere, also new from the prior MRI. 3. Known small subacute infarcts within the supratentorial brain (involving multiple vascular territories). Overall, the constellation of findings is highly suspicious for an embolic process. Additionally, there is a new subcentimeter focus of susceptibility-weighted signal loss within the left frontal lobe which is suspicious for a tiny embolic hemorrhage. 4. Background parenchymal atrophy, chronic small vessel disease and chronic infarcts as described.  Imaging personally reviewed and discussed by phone with Dr. 06/07/2022 of neuroradiology.  There was no evidence of hemorrhage on head CT earlier this afternoon therefore tiny embolic hemorrhage seen on susceptibility weighted imaging of the left frontal lobe on MRI is favored to be subacute and not acute.  Her acute infarcts likely explain her new onset seizure today.  I will hold her Eliquis for A-fib in the setting of acute infarcts  to reduce the risk of hemorrhagic conversion.  Will bridge with aspirin 81 mg starting tomorrow.  Further guidance on timing of anticoagulation restart to be provided by stroke team tomorrow. Given abnormality on susceptibility weighted imaging will do relative permissive hypertension with a goal blood pressure of less then 180/105. She has been controlled within the high range of this today on current regimen of entresto which I will continue. Continue LTM EEG overnight. Stroke team to assume care tomorrow.   Jackey Loge, MD [facility] [phone]  If 7pm- 7am, please page neurology on call as listed in AMION.

## 2022-06-25 NOTE — Procedures (Signed)
Patient Name: Rebekah Pope  MRN: [id number]  Epilepsy Attending: Lora Havens  Referring Physician/Provider: Cathlyn Parsons, PA-C  Date: 06/25/2022 Duration: 22.08 mins  Patient history: 81yo f with ams. EEG to evaluate  for seizure.  Level of alertness: Awake, asleep  AEDs during EEG study: LEV  Technical aspects: This EEG study was done with scalp electrodes positioned according to the 10-20 International system of electrode placement. Electrical activity was reviewed with band pass filter of 1-70Hz , sensitivity of 7 uV/mm, display speed of 57mm/sec with a 60Hz  notched filter applied as appropriate. EEG data were recorded continuously and digitally stored.  Video monitoring was available and reviewed as appropriate.  Description: During awake state, no clear posterior dominant rhythm was seen. Sleep was characterized by vertex waves, sleep spindles (12 to 14 Hz), maximal frontocentral region. EEG showed continuous generalized 3 to 5-6 Hz theta-delta slowing. Hyperventilation and photic stimulation were not performed.     ABNORMALITY - Continuous slow, generalized  IMPRESSION: This study is suggestive of moderate diffuse encephalopathy, nonspecific etiology. No seizures or epileptiform discharges were seen throughout the recording.  Nicoles Sedlacek Barbra Sarks

## 2022-06-25 NOTE — Progress Notes (Addendum)
MRI completed and discussed results with Dr. Lorrin Goodell.  The patient remains obtunded and bradycardic. Rapid response has re-evaluated her and with her decreased level of consciousness feel cannot protect her airway. I have consulted PCCM who will evaluate. Attendant nurse notified.  Patient being transferred off floor to ICU.

## 2022-06-25 NOTE — Progress Notes (Addendum)
Per note, Critical care MD [facility], Rebekah Pope came in around 20:30 to assess the patient . Decision made to transfer the patient to critical care for appropriate care. Patient code status changed to DNR after discussion with family members. Spoke with patient placement Katty earlier stating that patient will be going to 2 M room 14. Report given to nurse receiving the patient M.Emmanuel/ Nany. Patient  transferred to 89M room 14 Around 23:10 With all belongings pre-packed by family members. Spoke with son Rebekah Pope to inform of the discharge.

## 2022-06-25 NOTE — Progress Notes (Addendum)
Patient ID: Rebekah Pope, female   DOB: May 27, 1941, 82 y.o.   MRN: [id number]  Sw received call from daughter, Rebekah Pope. Now requesting an extension for the patient based on her conversation with OT, Rebekah Pope. SW will discuss with physician and team on Monday once physician returns. Sw will FU with patient daughter. Daughter would like to ensure a wheelchair is recommended for d/c. No additional questions or concerns.  1/18: Sw followed up with daughter to confirm [facility] services for [facility]. Daughter would like to continue with agency. Daughter requesting list of medications upon arrival next week. SW expressed this would be provided at d/c. No additional questions or concerns.   [facility] referral faxed to [place] [phone]  10:13AM: Facility currently does not have SLP available. SW will follow up with daughter to determine if she would like to remain at agency or transition to new agency. No additional concerns.

## 2022-06-25 NOTE — Progress Notes (Signed)
Spoke with RN about coming up for EEG. She will let me know when patient is back in the room and ready for EEG

## 2022-06-26 ENCOUNTER — Inpatient Hospital Stay (HOSPITAL_COMMUNITY)
Admission: RE | Admit: 2022-06-26 | Discharge: 2022-06-28 | DRG: 064 | Disposition: A | Discharge status: Hospice | Payer: Medicare PPO | Source: Other Acute Inpatient Hospital | Attending: Critical Care Medicine | Admitting: Critical Care Medicine

## 2022-06-26 ENCOUNTER — Other Ambulatory Visit (HOSPITAL_COMMUNITY): Payer: Self-pay

## 2022-06-26 DIAGNOSIS — R739 Hyperglycemia, unspecified: Secondary | ICD-10-CM | POA: Diagnosis present

## 2022-06-26 DIAGNOSIS — I480 Paroxysmal atrial fibrillation: Secondary | ICD-10-CM | POA: Diagnosis present

## 2022-06-26 DIAGNOSIS — Z79899 Other long term (current) drug therapy: Secondary | ICD-10-CM | POA: Diagnosis not present

## 2022-06-26 DIAGNOSIS — D631 Anemia in chronic kidney disease: Secondary | ICD-10-CM | POA: Diagnosis present

## 2022-06-26 DIAGNOSIS — I4821 Permanent atrial fibrillation: Secondary | ICD-10-CM | POA: Diagnosis present

## 2022-06-26 DIAGNOSIS — G9340 Encephalopathy, unspecified: Secondary | ICD-10-CM

## 2022-06-26 DIAGNOSIS — Z953 Presence of xenogenic heart valve: Secondary | ICD-10-CM

## 2022-06-26 DIAGNOSIS — I2510 Atherosclerotic heart disease of native coronary artery without angina pectoris: Secondary | ICD-10-CM | POA: Diagnosis present

## 2022-06-26 DIAGNOSIS — I10 Essential (primary) hypertension: Secondary | ICD-10-CM | POA: Diagnosis not present

## 2022-06-26 DIAGNOSIS — Z952 Presence of prosthetic heart valve: Secondary | ICD-10-CM | POA: Diagnosis not present

## 2022-06-26 DIAGNOSIS — I130 Hypertensive heart and chronic kidney disease with heart failure and stage 1 through stage 4 chronic kidney disease, or unspecified chronic kidney disease: Secondary | ICD-10-CM | POA: Diagnosis present

## 2022-06-26 DIAGNOSIS — E785 Hyperlipidemia, unspecified: Secondary | ICD-10-CM | POA: Diagnosis present

## 2022-06-26 DIAGNOSIS — G9349 Other encephalopathy: Secondary | ICD-10-CM | POA: Diagnosis present

## 2022-06-26 DIAGNOSIS — I69398 Other sequelae of cerebral infarction: Secondary | ICD-10-CM | POA: Diagnosis not present

## 2022-06-26 DIAGNOSIS — Z6832 Body mass index (BMI) 32.0-32.9, adult: Secondary | ICD-10-CM

## 2022-06-26 DIAGNOSIS — J9601 Acute respiratory failure with hypoxia: Secondary | ICD-10-CM | POA: Diagnosis present

## 2022-06-26 DIAGNOSIS — Z79890 Hormone replacement therapy (postmenopausal): Secondary | ICD-10-CM | POA: Diagnosis not present

## 2022-06-26 DIAGNOSIS — E669 Obesity, unspecified: Secondary | ICD-10-CM | POA: Diagnosis present

## 2022-06-26 DIAGNOSIS — I69320 Aphasia following cerebral infarction: Secondary | ICD-10-CM | POA: Diagnosis not present

## 2022-06-26 DIAGNOSIS — R4182 Altered mental status, unspecified: Secondary | ICD-10-CM

## 2022-06-26 DIAGNOSIS — I6381 Other cerebral infarction due to occlusion or stenosis of small artery: Principal | ICD-10-CM | POA: Diagnosis present

## 2022-06-26 DIAGNOSIS — R2689 Other abnormalities of gait and mobility: Secondary | ICD-10-CM | POA: Diagnosis present

## 2022-06-26 DIAGNOSIS — Z515 Encounter for palliative care: Secondary | ICD-10-CM | POA: Diagnosis not present

## 2022-06-26 DIAGNOSIS — Z7901 Long term (current) use of anticoagulants: Secondary | ICD-10-CM | POA: Diagnosis not present

## 2022-06-26 DIAGNOSIS — Z66 Do not resuscitate: Secondary | ICD-10-CM | POA: Diagnosis not present

## 2022-06-26 DIAGNOSIS — N1832 Chronic kidney disease, stage 3b: Secondary | ICD-10-CM | POA: Diagnosis present

## 2022-06-26 DIAGNOSIS — R001 Bradycardia, unspecified: Secondary | ICD-10-CM | POA: Diagnosis not present

## 2022-06-26 DIAGNOSIS — G8929 Other chronic pain: Secondary | ICD-10-CM | POA: Diagnosis present

## 2022-06-26 DIAGNOSIS — Z7189 Other specified counseling: Secondary | ICD-10-CM | POA: Diagnosis not present

## 2022-06-26 DIAGNOSIS — I5032 Chronic diastolic (congestive) heart failure: Secondary | ICD-10-CM | POA: Diagnosis present

## 2022-06-26 DIAGNOSIS — E039 Hypothyroidism, unspecified: Secondary | ICD-10-CM | POA: Diagnosis present

## 2022-06-26 LAB — CBC
HCT: 34.5 % — ABNORMAL LOW (ref 36.0–46.0)
Hemoglobin: 11.4 g/dL — ABNORMAL LOW (ref 12.0–15.0)
MCH: 29.5 pg (ref 26.0–34.0)
MCHC: 33 g/dL (ref 30.0–36.0)
MCV: 89.1 fL (ref 80.0–100.0)
Platelets: 211 10*3/uL (ref 150–400)
RBC: 3.87 MIL/uL (ref 3.87–5.11)
RDW: 13.7 % (ref 11.5–15.5)
WBC: 11.3 10*3/uL — ABNORMAL HIGH (ref 4.0–10.5)
nRBC: 0 % (ref 0.0–0.2)

## 2022-06-26 LAB — GLUCOSE, CAPILLARY
Glucose-Capillary: 107 mg/dL — ABNORMAL HIGH (ref 70–99)
Glucose-Capillary: 108 mg/dL — ABNORMAL HIGH (ref 70–99)
Glucose-Capillary: 122 mg/dL — ABNORMAL HIGH (ref 70–99)
Glucose-Capillary: 90 mg/dL (ref 70–99)
Glucose-Capillary: 94 mg/dL (ref 70–99)
Glucose-Capillary: 96 mg/dL (ref 70–99)

## 2022-06-26 LAB — BASIC METABOLIC PANEL
Anion gap: 6 (ref 5–15)
BUN: 15 mg/dL (ref 8–23)
CO2: 28 mmol/L (ref 22–32)
Calcium: 8.8 mg/dL — ABNORMAL LOW (ref 8.9–10.3)
Chloride: 107 mmol/L (ref 98–111)
Creatinine, Ser: 1.06 mg/dL — ABNORMAL HIGH (ref 0.44–1.00)
GFR, Estimated: 52 mL/min — ABNORMAL LOW (ref >60)
Glucose, Bld: 109 mg/dL — ABNORMAL HIGH (ref 70–99)
Potassium: 3.8 mmol/L (ref 3.5–5.1)
Sodium: 141 mmol/L (ref 135–145)

## 2022-06-26 LAB — MAGNESIUM: Magnesium: 2 mg/dL (ref 1.7–2.4)

## 2022-06-26 LAB — MRSA NEXT GEN BY PCR, NASAL: MRSA by PCR Next Gen: NOT DETECTED

## 2022-06-26 LAB — PHOSPHORUS: Phosphorus: 4.3 mg/dL (ref 2.5–4.6)

## 2022-06-26 MED ORDER — DABIGATRAN ETEXILATE MESYLATE 150 MG PO CAPS
150.0000 mg | ORAL_CAPSULE | Freq: Two times a day (BID) | ORAL | Status: DC
  Filled 2022-06-26 (×3): qty 1

## 2022-06-26 MED ORDER — STROKE: EARLY STAGES OF RECOVERY BOOK
Freq: Once | Status: AC
  Filled 2022-06-26: qty 1

## 2022-06-26 MED ORDER — ORAL CARE MOUTH RINSE: 15.0000 mL | OROMUCOSAL | Status: DC | PRN

## 2022-06-26 MED ORDER — ASPIRIN 81 MG PO CHEW: 81.0000 mg | CHEWABLE_TABLET | Freq: Every day | ORAL | Status: DC

## 2022-06-26 MED ORDER — APIXABAN 5 MG PO TABS: 5.0000 mg | ORAL_TABLET | Freq: Two times a day (BID) | ORAL | Status: DC

## 2022-06-26 MED ORDER — DABIGATRAN ETEXILATE MESYLATE 150 MG PO CAPS: 150.0000 mg | ORAL_CAPSULE | Freq: Two times a day (BID) | ORAL | Status: DC

## 2022-06-26 MED ORDER — ORAL CARE MOUTH RINSE
15.0000 mL | OROMUCOSAL | Status: DC
  Administered 2022-06-26 – 2022-06-27 (×3): 15 mL via OROMUCOSAL

## 2022-06-26 MED ORDER — POTASSIUM CHLORIDE IN NACL 20-0.9 MEQ/L-% IV SOLN
INTRAVENOUS | Status: DC
  Filled 2022-06-26: qty 1000

## 2022-06-26 MED ORDER — LEVETIRACETAM IN NACL 500 MG/100ML IV SOLN
500.0000 mg | Freq: Two times a day (BID) | INTRAVENOUS | Status: DC
  Administered 2022-06-26 (×2): 500 mg via INTRAVENOUS
  Filled 2022-06-26 (×2): qty 100

## 2022-06-26 MED ORDER — CHLORHEXIDINE GLUCONATE CLOTH 2 % EX PADS
6.0000 | MEDICATED_PAD | Freq: Every day | CUTANEOUS | Status: DC
  Administered 2022-06-26 – 2022-06-27 (×2): 6 via TOPICAL

## 2022-06-26 NOTE — Progress Notes (Signed)
Speech Therapy Discharge Note  This patient was unable to complete the inpatient rehab program due to a medical event requiring transfer to acute care services; therefore, the patient did not meet their long term goals and has been discharged from skilled SLP services at this time.The patient left the program at a min-to-mod assist level for overall communication functioning.   See CareTool for functional status details.  If the patient is able to return to inpatient rehabilitation within 3 midnights, this may be considered an interrupted stay and therapy services will resume as ordered. Modification and reinstatement of their goals will be made upon completion of therapy service reevaluations.

## 2022-06-26 NOTE — IPAL (Signed)
  Interdisciplinary Goals of Care Family Meeting   Date carried out:: 06/26/2022  Location of the meeting: Bedside  Member's involved: Physician, Bedside Registered Nurse, and Family Member or next of kin  Durable Power of Attorney or acting medical decision maker: husband    Discussion: We discussed goals of care for Rebekah Pope .  I met with Rebekah Pope son and husband at bedside this morning. We reviewed her current condition and discussed that this third stroke is a significant setback. It would not be expected for her to regain consciousness for several weeks due to damage to the thalamus. Likely at this point she would be NH bound in the future. Her son and husband both relate that she would not want to be dependent on machines and she was uninterested in living in a nursing home. Her daughter is trying to get here from [place], but has had difficulty due to snow. She would want everything done to keep her alive until she arrives, but Rebekah Pope does not aggre with this. He would prefer she be DNR and DNI. If she gets uncomfortable, they would prefer to keep her comfortable, understanding that this could be an ominous sign that she is likely to pass away soon.   Code status: Full DNR  Disposition: Continue current acute care   Time spent for the meeting: 20 min.  Rebekah Pope 06/26/2022, 12:26 PM

## 2022-06-26 NOTE — Progress Notes (Signed)
Initial Nutrition Assessment  DOCUMENTATION CODES:  Not applicable  INTERVENTION:  Monitor GOC discussions If TF are requested, recommend the following: Osmolite 1.2 at 55 ml/h (1320 ml per day) Provides 1584 kcal, 73 gm protein, 1082 ml free water daily  NUTRITION DIAGNOSIS:  Inadequate oral intake related to lethargy/confusion as evidenced by NPO status.  GOAL:  Patient will meet greater than or equal to 90% of their needs  MONITOR:  Diet advancement, Other (Comment) (GOC)  REASON FOR ASSESSMENT:  New TF    ASSESSMENT:  Pt with hx of CAD, CHF, CKD3b, HTN, HLD, atrial fibrillation and prior CVA transferred to acute care from CIR due to a change in mental status. Imaging showed new strokes. Pt was admitted to CIR 1/4 after suffering an acute ischemic stroke.  Pt resting in bed at the time of assessment. Does not open eyes or acknowledge presence. Discussed in rounds, Neurology team and CCM working with family on [place] as pt has suffered a major stroke.   Cortrak order placed this AM by CCM by deciding to hold off on placement at this time due to ongoing [place]. Pt is currently DNR with no plans to intubate.  TF recommendations provided in the event they are needed over the weekend.   Intake/Output Summary (Last 24 hours) at 06/26/2022 1427 Last data filed at 06/26/2022 0603 Gross per 24 hour  Intake 211.59 ml  Output 475 ml  Net -263.41 ml  Net IO Since Admission: -263.41 mL [06/26/22 1427]  Nutritionally Relevant Medications: Continuous Infusions:  dextrose 5 % and 0.45 % NaCl with KCl 20 mEq/L 50 mL/hr at 06/26/22 0400   levETIRAcetam     PRN Meds: docusate sodium, ondansetron  Labs Reviewed: Creatinine 1.06 CBG ranges from 94-109 mg/dL over the last 24 hours   NUTRITION - FOCUSED PHYSICAL EXAM: Flowsheet Row Most Recent Value  Orbital Region Mild depletion  Upper Arm Region No depletion  Thoracic and Lumbar Region No depletion  Buccal Region No depletion  Temple  Region Mild depletion  Clavicle Bone Region Mild depletion  Clavicle and Acromion Bone Region No depletion  Scapular Bone Region No depletion  Dorsal Hand No depletion  Patellar Region No depletion  Anterior Thigh Region No depletion  Posterior Calf Region Mild depletion  Edema (RD Assessment) Moderate  Hair Reviewed  Eyes Reviewed  Mouth Reviewed  Skin Reviewed  Nails Reviewed    Diet Order:   Diet Order             Diet NPO time specified  Diet effective now                   EDUCATION NEEDS:  Not appropriate for education at this time  Skin:  Skin Assessment: Reviewed RN Assessment  Last BM:  1/18  Height:  Ht Readings from Last 1 Encounters:  06/25/22 5\' 3"  (1.6 m)    Weight:  Wt Readings from Last 1 Encounters:  06/26/22 79.3 kg    Ideal Body Weight:  52.3 kg  BMI:  Body mass index is 30.97 kg/m.  Estimated Nutritional Needs:  Kcal:  1500-1800 kcal/d Protein:  70-90g/d Fluid:  1.5-1.8L/d   Ranell Patrick, RD, LDN Clinical Dietitian RD pager # available in [facility]  After hours/weekend pager # available in [facility]

## 2022-06-26 NOTE — Progress Notes (Signed)
Physical Therapy Note  Patient Details  Name: Rebekah Pope MRN: [id number] Date of Birth: 07-05-1940 Today's Date: 06/26/2022    Physical Therapy Discharge Note  This patient was unable to complete the inpatient rehab program due to medical issues, AMS; therefore did not meet their long term goals. Pt left the program at a min A level for bed mobility to EOB, sit to stand and SPT to w/c w/ mod A and amb in [place] up to 180'  w/ c/o R knee pain for functional mobility/ transfers. This patient is being discharged from PT services at this time.  Pt's perception of pain in the last five days was unable to answer at this time.    See CareTool for functional status details  If the patient is able to return to inpatient rehabilitation within 3 midnights, this may be considered an interrupted stay and therapy services will resume as ordered. Modification and reinstatement of their goals will be made upon completion of therapy service reevaluations.     [name] 06/26/2022, 9:32 AM

## 2022-06-26 NOTE — Progress Notes (Signed)
Occupational Therapy Discharge Note  This patient was unable to complete the inpatient rehab program due to rapid response called on Pt with suspected new CVA with Pt being transported off the unit; therefore did not meet their long term goals. Pt left the program at a max assist level for her LB functional ADLs and supervision level for UB BADLs. Pt completing transfers at a mod assist level. This patient is being discharged from OT services at this time.  BIMS at time of d/c  Pt unable to complete due to medical status  See CareTool for functional status details.  If the patient is able to return to inpatient rehabilitation within 3 midnights, this may be considered an interrupted stay and therapy services will resume as ordered. Modification and reinstatement of their goals will be made upon completion of therapy service reevaluations.

## 2022-06-26 NOTE — TOC CAGE-AID Note (Signed)
Transition of Care [facility]) - CAGE-AID Screening   Patient Details  Name: Sekai Nayak MRN: [id number] Date of Birth: November 18, 1940  Transition of Care [facility]) CM/SW Contact:    Bethann Berkshire, [place] Phone Number: 06/26/2022, 9:14 AM   CAGE-AID Screening: Substance Abuse Screening unable to be completed due to: : Patient unable to participate

## 2022-06-26 NOTE — Progress Notes (Addendum)
STROKE TEAM PROGRESS NOTE   INTERVAL HISTORY Patient is seen in her room with no family at the bedside.  Yesterday, while she was at rehab, she suddenly stopped talking and responding to stimuli.  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  She is currently somnolent and responds only to vigorous noxious stimuli.  After preliminary goals of care discussion, patient was made DNR/DNI, with possible transition to comfort care if her condition declines.  Vitals:   06/26/22 1345 06/26/22 1400 06/26/22 1415 06/26/22 1430  BP:  (!) 162/56  (!) 163/57  Pulse: (!) 36 (!) 37 (!) 35 (!) 37  Resp: (!) 21 19 (!) 21 19  Temp:      TempSrc:      SpO2: 99% 99% 100% 100%  Weight:      Height:       CBC:  Recent Labs  Lab 06/25/22 1518 06/26/22 0750  WBC 7.7 11.3*  HGB 11.7* 11.4*  HCT 34.8* 34.5*  MCV 87.2 89.1  PLT 216 161   Basic Metabolic Panel:  Recent Labs  Lab 06/25/22 1518 06/26/22 0757  NA 138 141  K 3.9 3.8  CL 104 107  CO2 26 28  GLUCOSE 116* 109*  BUN 17 15  CREATININE 1.04* 1.06*  CALCIUM 8.7* 8.8*  MG  --  2.0  PHOS  --  4.3   Lipid Panel: No results for input(s): "CHOL", "TRIG", "HDL", "CHOLHDL", "VLDL", "LDLCALC" in the last 168 hours. HgbA1c: No results for input(s): "HGBA1C" in the last 168 hours. Urine Drug Screen: No results for input(s): "LABOPIA", "COCAINSCRNUR", "LABBENZ", "AMPHETMU", "THCU", "LABBARB" in the last 168 hours.  Alcohol Level No results for input(s): "ETH" in the last 168 hours.  IMAGING past 24 hours MR BRAIN WO CONTRAST  Addendum Date: 06/25/2022   ADDENDUM REPORT: 06/25/2022 19:40 ADDENDUM: Impressions #1, #2 and #3 called by telephone at the time of interpretation on 06/25/2022 at 6:57 pm to provider Dr. Quinn Axe, who verbally acknowledged these results. Electronically Signed   By: Kellie Simmering D.O.   On: 06/25/2022 19:40   Result Date: 06/25/2022 CLINICAL DATA:  Provided history: Mental status change, unknown  cause. EXAM: MRI HEAD WITHOUT CONTRAST TECHNIQUE: Multiplanar, multiecho pulse sequences of the brain and surrounding structures were obtained without intravenous contrast. COMPARISON:  Head CT 06/25/2022.  Brain MRI 06/05/2022. FINDINGS: Mild intermittent motion degradation. Brain: Mild-to-moderate cerebral atrophy. Mild cerebellar atrophy. There are known small subacute infarcts within the bilateral frontal and right occipital lobes (previously demonstrated on the brain MRI of 06/05/2022). However, new from this prior examination, there are symmetric acute infarcts within the medial thalami (suspicious for artery of Percheron infarcts), as well as a new subcentimeter acute infarct within the inferior right cerebellar hemisphere the. Chronic lacunar infarcts within bilateral cerebral hemispheric white matter. Background multifocal T2 FLAIR hyperintense signal abnormality within the cerebral white matter, nonspecific but also compatible with chronic small vessel image disease. Small T2 hyperintense foci within bilateral basal ganglia compatible with prominent perivascular spaces, and possible superimposed chronic lacunar infarcts. Tiny chronic infarcts within the bilateral cerebellar hemispheres. Chronic microhemorrhages within the bilateral frontal lobes. Additionally, there is a punctate focus of susceptibility-weighted signal loss within the left frontal lobe which was not present on the prior brain MRI and is suspicious for a tiny embolic hemorrhage (series 6, image 58). No evidence of an intracranial mass No extra-axial fluid collection. No midline shift. Vascular: Maintained flow voids within the proximal large arterial vessels.  Skull and upper cervical spine: No focal suspicious marrow lesion. Sinuses/Orbits: No mass or acute finding within the imaged orbits. Fluid levels, and mild mucosal thickening, within the bilateral maxillary sinuses. Minimal mucosal thickening scattered within bilateral ethmoid air  cells. Other: Trace fluid within the bilateral mastoid air cells. Attempts are being made to reach the ordering provider at this time. IMPRESSION: 1. New from the prior brain MRI of 06/05/2022, there are symmetric acute infarcts within the medial thalami (which are suspicious for artery of Percheron infarcts). 2. Subcentimeter acute infarct within the inferior right cerebellar hemisphere, also new from the prior MRI. 3. Known small subacute infarcts within the supratentorial brain (involving multiple vascular territories). Overall, the constellation of findings is highly suspicious for an embolic process. Additionally, there is a new subcentimeter focus of susceptibility-weighted signal loss within the left frontal lobe which is suspicious for a tiny embolic hemorrhage. 4. Background parenchymal atrophy, chronic small vessel disease and chronic infarcts as described. 5. Paranasal sinus disease, as outlined. Electronically Signed: By: Jackey Loge D.O. On: 06/25/2022 18:37   EEG adult  Result Date: 06/25/2022 Charlsie Quest, MD     06/26/2022  8:30 AM Patient Name: Teagyn Fishel MRN: [id number] Epilepsy Attending: Charlsie Quest Referring Physician/Provider: Charlton Amor, PA-C Date: 06/25/2022 Duration: 22.08 mins Patient history: 81yo f with ams. EEG to evaluate  for seizure. Level of alertness: Awake, asleep AEDs during EEG study: LEV Technical aspects: This EEG study was done with scalp electrodes positioned according to the 10-20 International system of electrode placement. Electrical activity was reviewed with band pass filter of 1-70Hz , sensitivity of 7 uV/mm, display speed of 2mm/sec with a 60Hz  notched filter applied as appropriate. EEG data were recorded continuously and digitally stored.  Video monitoring was available and reviewed as appropriate. Description: During awake state, no clear posterior dominant rhythm was seen. Sleep was characterized by vertex waves, sleep spindles (12 to  14 Hz), maximal frontocentral region. EEG showed continuous generalized 3 to 5-6 Hz theta-delta slowing. Hyperventilation and photic stimulation were not performed.   ABNORMALITY - Continuous slow, generalized IMPRESSION: This study is suggestive of moderate diffuse encephalopathy, nonspecific etiology. No seizures or epileptiform discharges were seen throughout the recording. Priyanka    PHYSICAL EXAM General: Somnolent, ill-appearing elderly patient in no acute distress Respiratory: Regular, unlabored respirations on supplemental O2 Cardiovascular: Sinus bradycardia in the 30s seen on monitor Neurological:: Eyes are closed.  Does not open eyes even with vigorous stimulation.  Speech is gibberish.  Does not follow any commands.  Pupils sluggish but reactive, positive oculocephalic reflex, somnolent, responds only to vigorous noxious stimuli, will localize noxious stimuli with left upper extremity, will extend right upper extremity to noxious stimuli and will withdraw bilateral lower extremities to noxious stimuli  ASSESSMENT/PLAN Ms. Carmelita Amparo is a 82 y.o. female with history of CAD, CHF, CKD stage IIIb, complex regional pain syndrome, hypertension, hiatal hernia, hyperlipidemia, hypothyroidism, atrial fibrillation on Eliquis, TAVR and recent stroke presenting after sheshe suddenly stopped talking and responding to stimuli while at rehab yesterday.  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  She is currently somnolent and responds only to vigorous noxious stimuli.  Given recurrent stroke on Eliquis, will switch to Pradaxa when enteral access achieved and pending goals of care discussion.  After preliminary goals of care discussion, patient was made DNR/DNI, with possible transition to comfort care if her condition declines.  Goals of care discussions are  ongoing, currently awaiting arrival of her daughter from out of state.  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 Small vessel disease. Atrophy.   MRI symmetric acute infarcts in bilateral thalami, acute infarct in inferior right cerebellar hemisphere MRA pending 2D Echo EF 60 to 65%, mild concentric LVH, normal left atrial size, no atrial level shunt LDL 51 HgbA1c 6.0 VTE prophylaxis -fully anticoagulated with Pradaxa    Diet   Diet NPO time specified   Eliquis (apixaban) daily prior to admission, will start Pradaxa (dabigatran) twice a day when enteral access achieved Therapy recommendations: Pending Disposition: Pending  Atrial fibrillation Patient has history of A-fib, was taking Eliquis previously Was taking amiodarone at home 100 mg daily Given recurrent stroke on Eliquis, will need to switch to Pradaxa.  Bradycardia Patient's heart rate remains in the 30s, blood pressure remains stable Avoid beta-blockers  Hypertension Home meds: Hydralazine 25 mg 3 times daily Stable Permissive hypertension (OK if < 220/120) but gradually normalize in 5-7 days Long-term BP goal normotensive  Hyperlipidemia Home meds: Atorvastatin 80 mg daily, will resume when enteral access achieved LDL 51, goal < 70 Continue statin at discharge   Other Stroke Risk Factors Advanced Age >/= 75  Obesity, Body mass index is 30.97 kg/m., BMI >/= 30 associated with increased stroke risk, recommend weight loss, diet and exercise as appropriate  Hx stroke/ Coronary artery disease Congestive heart failure  Other Active Problems Hypothyroidism-continue home Synthroid when enteral access achieved  Hospital day # 0  Cortney E Ernestina Columbia , MSN, AGACNP-BC [facility] See Amion for schedule and pager information 06/26/2022 3:03 PM   STROKE MD NOTE : I have personally obtained history,examined this patient, reviewed notes, independently viewed imaging  studies, participated in medical decision making and plan of care.ROS completed by me personally and pertinent positives fully documented  I have made any additions or clarifications directly to the above note. Agree with note above.  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  She was on Eliquis for A-fib.  Patient developed sudden onset of altered mental status and decreased responsiveness yesterday and MRI scan shows bilateral medial thalamic and right cerebellar infarcts.  Neurological exam is quite poor and she is likely going to have disabling neurological deficits and ended up with prolonged needs for ventilatory support and feeding tube likely in a nursing home requiring 24-hour care.  I had a long discussion with the patient's son and husband at the bedside and they clearly feels she would not have wanted her life to be prolonged if her prognosis was so poor.  They agree with DNR and did not escalate care and are awaiting arrival of patient's daughter in [place] tomorrow and likely leaning towards comfort care.  Discussed with Dr. Shirley Muscat critical care MD and answered questions.This patient is critically ill and at significant risk of neurological worsening, death and care requires constant monitoring of vital signs, hemodynamics,respiratory and cardiac monitoring, extensive review of multiple databases, frequent neurological assessment, discussion with family, other specialists and medical decision making of high complexity.I have made any additions or clarifications directly to the above note.This critical care time does not reflect procedure time, or teaching time or supervisory time of PA/NP/Med Resident etc but could involve care discussion time.  I spent  35 minutes of neurocritical care time  in the care of  this patient.      Antony Contras,  MD Medical Director [facility] Pager: [phone] 06/26/2022 4:30 PM   To contact Stroke Continuity provider, please refer to [email or web address] After hours, contact General Neurology

## 2022-06-26 NOTE — Progress Notes (Signed)
eLink Physician-Brief Progress Note Patient Name: Rebekah Pope DOB: [birth year] MRN: [id number]   Date of Service  06/26/2022  HPI/Events of Note  Bladder scan shows 458 ml of urine in the bladder.  eICU Interventions  In / Out bladder cath ordered.        Kerry Kass [name] 06/26/2022, 5:41 AM

## 2022-06-26 NOTE — TOC Benefit Eligibility Note (Signed)
Patient Teacher, English as a foreign language completed.    The patient is currently admitted and upon discharge could be taking dabigatran (Pradaxa) 150 mg capsules.  The current 30 day co-pay is $10.00.   The patient is insured through [place], [place] Patient Advocate Specialist [place] Patient Advocate Team Direct Number: [phone]  Fax: [phone]

## 2022-06-26 NOTE — Progress Notes (Signed)
NAME:  Rebekah Pope, MRN:  [id number], DOB:  [birth year], LOS: 0 ADMISSION DATE:  06/26/2022, CONSULTATION DATE:  06/25/22 REFERRING MD:  Sarita Haver, CHIEF COMPLAINT:  Change in mental status   History of Present Illness:  82 y/o female with a recent history of strokes in December was in CIR this evening when she had a sudden change in mental status and head imaging confirmed new Percheron infarcts medial thalami and new inferior right cerebellar strokes.  PCCM consulted given change in mental status and concern for airway protection. The patient could not provide history on my evaluation due to the change in her mental status.  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 Burt Knack & Dr. Cyndia Bent   Severe aortic stenosis    Sinus bradycardia    Stroke (cerebrum) ([place])     Significant Hospital Events: Including procedures, antibiotic start and stop dates in addition to other pertinent events   12/3 MRI brain > scattered tiny acute infarcts suggesting central embolic disease  44/31 IR cerebral angiogram > occlusion of intracranial left ICA at communicating segment with near occlusive and chronica appearing stenosis of bilateral P1/PCA segments> thrombectomy performed successfully 1/18 MRI brain > new new Percheron infarcts medial thalami and new inferior right  cerebellar strokes 1/18 called for change in mental status due to new strokes as above  Interim History / Subjective:  Somnolent but is protecting airway. Flexes and grimaces to noxious stimuli.  Objective   Blood pressure (!) 175/47, pulse (!) 33, temperature (!) 97.5 F (36.4 C), temperature source Oral, resp. rate (!) 22, height 5\' 3"  (1.6 m), weight 79.3 kg, SpO2 99 %.        Intake/Output Summary (Last 24 hours) at 06/26/2022 0707 Last data filed at 06/26/2022 0603 Gross per 24 hour  Intake 211.59 ml  Output 475 ml  Net -263.41 ml    Filed Weights   06/25/22 2323 06/26/22 0342  Weight: 79.3 kg 79.3 kg    Examination:  General: Elderly female, chronically ill appearing. Neuro: Somnolent, does not follow commands. Does flex and grimace to noxious stimuli. HEENT: [place]/AT. Sclerae anicteric. Pupils sluggish. Cardiovascular: Loletha Grayer in 30s, irregular, no M/R/G.  Lungs: Respirations even and unlabored.  CTA bilaterally, No W/R/R. Abdomen: BS x 4, soft, NT/ND.  Musculoskeletal: No gross deformities, no edema.  Skin: Intact, warm, no rashes.   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 strokes prior to 1/18 stroke Admit to ICU Secondary stroke prevention per neurology Goal glucose 140-180 Goal SBP < 140 PT/OT/SLP   Acute encephalopathy due to posterior circulation stroke Aspiration  precautions Monitor airway and respiratory status in ICU, at risk for intubation NPO  Hx Diastolic heart failure, Permanent atrial fibrillation, HLD, S/p TAVR [phone] Continue Eliquis, Lipitor (awaiting cortrak placement) Hold Amiodarone for now given her degree of bradycardia (though per report, she is chronically bradycardic in 40s) Tele Monitor volume status  Hypothyroidism Continue  Synthroid once cortrak placed  Constipation  Miralax scheduled, colace  Anemia without bleeding Monitor for bleeding Transfuse PRBC for Hgb < 7 gm/dL   Best Practice (right click and "Reselect all SmartList Selections" daily)   Diet/type: NPO DVT prophylaxis: DOAC GI prophylaxis: N/A Lines: N/A Foley:  N/A Code Status:  full code Last date of multidisciplinary goals of care discussion [1/18 [place] with family, see iPAL note]   Critical care time: 30 minutes    Montey Hora, PA - [facility] For pager details, please see AMION or use Epic chat  After 1900, please call Rule for cross coverage needs 06/26/2022, 7:27 AM

## 2022-06-26 NOTE — Progress Notes (Signed)
Inpatient Rehabilitation Care Coordinator Discharge Note   Patient Details  Name: Rebekah Pope MRN: [id number] Date of Birth: [birth year]   Discharge location: TRANSFERRED TO ACUTE MEDICAL ISSUES  Length of Stay: 15 DAYS  Discharge activity level: MIN ASSIST  Home/community participation: ACTIVE  Patient response BV:[id number] Literacy - How often do you need to have someone help you when you read instructions, pamphlets, or other written material from your doctor or pharmacy?: Always  Patient response [id number] Isolation - How often do you feel lonely or isolated from those around you?: Never  Services provided included: MD, RD, PT, OT, SLP, RN, CM, TR, Pharmacy, SW  Financial Services:  Charity fundraiser Utilized: Private Insurance [employer] MEDICARE  Choices offered to/list presented to: RITA-DAUGHTER  Follow-up services arranged:  Other (Comment) (TRANSFERRED TO ACUTE)           Patient response to transportation need: Is the patient able to respond to transportation needs?: Yes In the past 12 months, has lack of transportation kept you from medical appointments or from getting medications?: No In the past 12 months, has lack of transportation kept you from meetings, work, or from getting things needed for daily living?: No    Comments (or additional information):TRANSFERRED TO ACUTE FOR MEDICAL ISSUES  Patient/Family verbalized understanding of follow-up arrangements:  Yes  Individual responsible for coordination of the follow-up plan: RITA-DAUGHTER  Confirmed correct DME delivered: Elease Hashimoto 06/26/2022    Elease Hashimoto

## 2022-06-26 NOTE — Progress Notes (Signed)
Cortrak Tube Team Note:  Consult received to place a Cortrak feeding tube.   Discussed Cortrak placement with Dr. Carlis Abbott prior to entering room. Dr. Carlis Abbott wants to hold off on Cortrak insertion at this time. MD to re-consult Cortrak team if change in poc and Cortrak placement is desired  Kerman Passey MS, RDN, LDN, CNSC Registered Dietitian 3 Clinical Nutrition RD Pager and On-Call Pager Number Located in [place]

## 2022-06-26 NOTE — Progress Notes (Signed)
ANTICOAGULATION CONSULT NOTE - Initial Consult  Pharmacy Consult for apixaban Indication: atrial fibrillation  No Known Allergies  Patient Measurements: Height: 5\' 3"  (160 cm) Weight: 79.3 kg (174 lb 13.2 oz) IBW/kg (Calculated) : 52.4  Vital Signs: Temp: 97.5 F (36.4 C) (01/18 2323) Temp Source: Oral (01/18 2238) BP: 131/51 (01/19 0026) Pulse Rate: 34 (01/19 0026)  Labs: Recent Labs    06/24/22 0733 06/25/22 1518  HGB 11.3* 11.7*  HCT 33.0* 34.8*  PLT 224 216  CREATININE 0.95 1.04*    Estimated Creatinine Clearance: 40.9 mL/min (A) (by C-G formula based on SCr of 1.04 mg/dL (H)).   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 Burt Knack & Dr. Cyndia Bent   Severe aortic stenosis    Sinus bradycardia    Stroke (cerebrum) [facility])     Assessment: 82yo female to continue apixaban for Afib after transferring to ICU from CIR.  Plan:  Continue apixaban 5mg  PO BID; f/u whether meds need to change to per tube.  Wynona Neat, PharmD, BCPS  06/26/2022,12:38 AM

## 2022-06-26 NOTE — Discharge Summary (Signed)
Physician Discharge Summary  Patient ID: Rheanna Sergent MRN: [id number] DOB/AGE: 1940-10-06 82 y.o.  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 Selina Cooley, who verbally acknowledged these results. Electronically Signed   By: Jackey Loge D.O.   On: 06/25/2022 19:40   Result Date: 06/25/2022 CLINICAL DATA:  Provided history: Mental status change, unknown cause. EXAM: MRI HEAD WITHOUT CONTRAST TECHNIQUE: Multiplanar, multiecho pulse sequences of the brain and surrounding structures were obtained without intravenous contrast. COMPARISON:  Head CT 06/25/2022.  Brain MRI 06/05/2022. FINDINGS: Mild intermittent motion degradation. Brain: Mild-to-moderate cerebral atrophy. Mild cerebellar atrophy. There are known small subacute infarcts within the bilateral frontal and right occipital lobes (previously demonstrated on the brain MRI of 06/05/2022). However, new from this prior examination, there are symmetric acute infarcts within the medial thalami (suspicious for artery of Percheron infarcts), as well as a new subcentimeter acute infarct within the inferior right cerebellar hemisphere the. Chronic lacunar infarcts within bilateral cerebral hemispheric white matter. Background multifocal T2 FLAIR hyperintense signal abnormality within the cerebral white matter, nonspecific but also  compatible with chronic small vessel image disease. Small T2 hyperintense foci within bilateral basal ganglia compatible with prominent perivascular spaces, and possible superimposed chronic lacunar infarcts. Tiny chronic infarcts within the bilateral cerebellar hemispheres. Chronic microhemorrhages within the bilateral frontal lobes. Additionally, there is a punctate focus of susceptibility-weighted signal loss within the left frontal lobe which was not present on the prior brain MRI and is suspicious for a tiny embolic hemorrhage (series 6, image 58). No evidence of an intracranial mass No extra-axial fluid collection. No midline shift. Vascular: Maintained flow voids within the proximal large arterial vessels. Skull and upper cervical spine: No focal suspicious marrow lesion. Sinuses/Orbits: No mass or acute finding within the imaged orbits. Fluid levels, and mild mucosal thickening, within the bilateral maxillary sinuses. Minimal mucosal thickening scattered within bilateral ethmoid air cells. Other: Trace fluid within the bilateral mastoid air cells. Attempts are being made to reach the ordering provider at this time. IMPRESSION: 1. New from the prior brain MRI of 06/05/2022, there are symmetric acute infarcts within the medial thalami (which are suspicious for artery of Percheron infarcts). 2. Subcentimeter acute infarct within the inferior right cerebellar hemisphere, also new from the prior MRI. 3. Known small subacute infarcts within the supratentorial brain (involving multiple vascular territories). Overall, the constellation of findings is highly suspicious for an embolic process. Additionally, there is a new subcentimeter focus of susceptibility-weighted signal loss within the left frontal lobe which is suspicious for a tiny embolic hemorrhage. 4. Background parenchymal atrophy, chronic small vessel disease and chronic infarcts as described. 5. Paranasal sinus disease, as outlined. Electronically Signed:  By: Jackey Loge D.O. On: 06/25/2022 18:37   CT HEAD WO CONTRAST ( )  Result Date: 06/25/2022 CLINICAL DATA:  Altered mental status, nontraumatic. EXAM: CT HEAD WITHOUT CONTRAST TECHNIQUE: Contiguous axial images were obtained from the base of the skull through the vertex without intravenous contrast. RADIATION DOSE REDUCTION: This exam  was performed according to the departmental dose-optimization program which includes automated exposure control, adjustment of the mA and/or kV according to patient size and/or use of iterative reconstruction technique. COMPARISON:  Head CT 06/14/2022. Brain MRI 06/05/2022 FINDINGS: Brain: No evidence of acute infarction, hemorrhage, hydrocephalus, extra-axial collection or mass lesion/mass effect. Stable degree of atrophy and chronic small vessel ischemia. Small remote lacunar infarcts are present that are stable from prior exam. Vascular: Skull base atherosclerosis. No acute vascular findings. Skull: No acute findings. Sinuses/Orbits: Increased mucosal thickening of ethmoid air cells with small fluid levels in both maxillary sinuses, new. Occasional opacification of lower right mastoid air cells. Other: None. IMPRESSION: 1. No acute intracranial abnormality. 2. Stable atrophy and chronic small vessel ischemia. 3. Increased mucosal thickening of ethmoid air cells with small fluid levels in both maxillary sinuses, new from prior exam, may represent acute sinusitis in the appropriate clinical setting. Electronically Signed   By: Narda RutherfordMelanie  Sanford M.D.   On: 06/25/2022 13:55   DG Chest 1 View  Result Date: 06/25/2022 CLINICAL DATA:  82 year old female with shortness of breath. EXAM: CHEST  1 VIEW COMPARISON:  Portable chest 06/04/2022 and earlier. FINDINGS: Portable AP semi upright view at 1334 hours. Chronic TAVR, Calcified aortic atherosclerosis. Stable cardiac size, borderline cardiomegaly. Other mediastinal contours are within normal limits. Visualized tracheal air column  is within normal limits. Stable lung volumes. Allowing for portable technique the lungs are clear. No pneumothorax or pleural effusion. No acute osseous abnormality identified. Negative visible bowel gas. IMPRESSION: No acute cardiopulmonary abnormality. Aortic Atherosclerosis (ICD10-I70.0).  TAVR. Electronically Signed   By: Odessa FlemingH  Hall M.D.   On: 06/25/2022 13:45   DG Knee Complete 4 Views Right  Result Date: 06/24/2022 CLINICAL DATA:  Right knee pain EXAM: RIGHT KNEE - COMPLETE 4+ VIEW COMPARISON:  None Available. FINDINGS: Frontal, bilateral oblique, lateral views of the right knee are obtained. No fracture, subluxation, or dislocation. 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 No joint effusion. Soft tissues are unremarkable. IMPRESSION: 1. 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 2. No acute bony abnormality. Electronically Signed   By: Sharlet SalinaMichael  Brown M.D.   On: 06/24/2022 15:21   LONG TERM MONITOR-LIVE TELEMETRY (3-14 DAYS)  Result Date: 06/17/2022 Patch Wear Time:  14 days and 0 hours (2023-12-14T13:57:12-498 to 2023-12-28T13:57:12-498) Patient had a min HR of 33 bpm, max HR of 138 bpm, and avg HR of 48 bpm. Predominant underlying rhythm was Sinus Rhythm. First Degree AV Block was present. Bundle Branch Block/IVCD was present. Chronotropic incompetence is seen with peak systolic heart rate of only 70 bpm No episodes of sinus arrest or second or third-degree AV block There are no symptom episodes There were no episodes of atrial fibrillation or flutter 14 Supraventricular Tachycardia runs occurred, the run with the fastest interval lasting 7 beats with a max rate of 138 bpm, the longest lasting 12 beats with an avg rate of 92 bpm. Isolated SVEs were rare (<1.0%), SVE Couplets were rare (<1.0%), and SVE Triplets were rare (<1.0%). Isolated VEs were occasional (2.1%, 17338), VE Couplets were rare (<1.0%, 1194), and no VE  Triplets were present. Ventricular Bigeminy and Trigeminy were present.   CT HEAD WO CONTRAST (5MM)  Result Date: 06/14/2022 CLINICAL DATA:  Neuro deficit.  Stroke suspected. EXAM: CT HEAD WITHOUT CONTRAST TECHNIQUE: Contiguous axial images were obtained from the base of the skull through the vertex without intravenous contrast. RADIATION DOSE REDUCTION: This exam was performed  according to the departmental dose-optimization program which includes automated exposure control, adjustment of the mA and/or kV according to patient size and/or use of iterative reconstruction technique. COMPARISON:  MR head 06/05/2022 FINDINGS: Brain: No evidence of acute infarction, hemorrhage, hydrocephalus, extra-axial collection or mass lesion/mass effect. There is mild diffuse low-attenuation within the subcortical and periventricular white matter compatible with chronic microvascular disease. Prominence of sulci and ventricles compatible with mild brain atrophy. Vascular: No hyperdense vessel or unexpected calcification. Skull: Normal. Negative for fracture or focal lesion. Sinuses/Orbits: Paranasal sinuses and mastoid air cells are clear. Other: None IMPRESSION: 1. No acute intracranial abnormalities. 2. Chronic microvascular disease and brain atrophy. Electronically Signed   By: Signa Kell M.D.   On: 06/14/2022 10:11   MR BRAIN WO CONTRAST  Result Date: 06/05/2022 CLINICAL DATA:  Stroke follow-up EXAM: MRI HEAD WITHOUT CONTRAST MRA HEAD WITHOUT CONTRAST TECHNIQUE: Multiplanar, multi-echo pulse sequences of the brain and surrounding structures were acquired without intravenous contrast. Angiographic images of the Circle of Willis were acquired using MRA technique without intravenous contrast. COMPARISON:  05/10/2022 brain MRI 06/04/2022 CTA head neck. FINDINGS: MRI HEAD FINDINGS Brain: Multiple punctate foci of abnormal diffusion restriction within both hemispheres, some of which are new since 05/10/2022 (right corona  radiata, left caudate, left mesial temporal lobe). No large area of acute ischemia. No acute hemorrhage or other extra-axial collection. No chronic microhemorrhage. There is multifocal hyperintense T2-weighted signal within the periventricular and deep white matter. Mild generalized atrophy. Midline structures are normal. Old bilateral cerebellar and left corona radiata small vessel infarcts. Vascular: Unchanged abnormal right vertebral artery flow void. The dominant left vertebral artery flow void remains normal. Normal ICA flow voids. Skull and upper cervical spine: Normal marrow signal. Sinuses/Orbits: Negative Other: None MRA HEAD FINDINGS POSTERIOR CIRCULATION: --Vertebral arteries: Left dominant.  Both are patent. --Inferior cerebellar arteries: Normal. --Basilar artery: Normal. --Superior cerebellar arteries: Normal. --Posterior cerebral arteries: Occluded proximally bilaterally. ANTERIOR CIRCULATION: --Intracranial internal carotid arteries: The supraclinoid left ICA is now patent. --Anterior cerebral arteries (ACA): Normal. --Middle cerebral arteries (MCA): Normal flow related enhancement bilaterally. Anatomic variants: None IMPRESSION: 1. A few scattered punctate foci of new acute/early subacute ischemia within multiple vascular territories. Multiple other recent foci of subacute ischemia as seen on 05/10/2022. 2. The supraclinoid left ICA and the left MCA are now patent. 3. 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 Electronically Signed   By: Deatra Robinson M.D.   On: 06/05/2022 02:00   MR ANGIO HEAD WO CONTRAST  Result Date: 06/05/2022 CLINICAL DATA:  Stroke follow-up EXAM: MRI HEAD WITHOUT CONTRAST MRA HEAD WITHOUT CONTRAST TECHNIQUE: Multiplanar, multi-echo pulse sequences of the brain and surrounding structures were acquired without intravenous contrast. Angiographic images of the Circle of Willis were acquired using MRA technique without intravenous contrast.  COMPARISON:  05/10/2022 brain MRI 06/04/2022 CTA head neck. FINDINGS: MRI HEAD FINDINGS Brain: Multiple punctate foci of abnormal diffusion restriction within both hemispheres, some of which are new since 05/10/2022 (right corona radiata, left caudate, left mesial temporal lobe). No large area of acute ischemia. No acute hemorrhage or other extra-axial collection. No chronic microhemorrhage. There is multifocal hyperintense T2-weighted signal within the periventricular and deep white matter. Mild generalized atrophy. Midline structures are normal. Old bilateral cerebellar and left corona radiata small vessel infarcts. Vascular: Unchanged abnormal right vertebral artery flow void. The dominant left vertebral artery flow void remains normal. Normal ICA flow voids. Skull and upper cervical spine: Normal marrow signal. Sinuses/Orbits: Negative Other:  None MRA HEAD FINDINGS POSTERIOR CIRCULATION: --Vertebral arteries: Left dominant.  Both are patent. --Inferior cerebellar arteries: Normal. --Basilar artery: Normal. --Superior cerebellar arteries: Normal. --Posterior cerebral arteries: Occluded proximally bilaterally. ANTERIOR CIRCULATION: --Intracranial internal carotid arteries: The supraclinoid left ICA is now patent. --Anterior cerebral arteries (ACA): Normal. --Middle cerebral arteries (MCA): Normal flow related enhancement bilaterally. Anatomic variants: None IMPRESSION: 1. A few scattered punctate foci of new acute/early subacute ischemia within multiple vascular territories. Multiple other recent foci of subacute ischemia as seen on 05/10/2022. 2. The supraclinoid left ICA and the left MCA are now patent. 3. 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 Electronically Signed   By: Ulyses Jarred M.D.   On: 06/05/2022 02:00   ECHOCARDIOGRAM COMPLETE  Result Date: 06/04/2022    ECHOCARDIOGRAM REPORT   Patient Name:   JANYA EVELAND Date of Exam: 06/04/2022 Medical Rec #:   [id number]            Height:       63.0 in Accession #:    [id number]           Weight:       193.6 lb Date of Birth:  1941-03-14             BSA:          1.907 m Patient Age:    53 years             BP:           128/54 mmHg Patient Gender: F                    HR:           40 bpm. Exam Location:  Inpatient Procedure: 2D Echo, Cardiac Doppler and Color Doppler Indications:    Stroke I63.9  History:        Patient has prior history of Echocardiogram examinations, most                 recent 05/11/2022. CHF, CAD, Stroke, Arrythmias:Atrial                 Fibrillation and Bradycardia; Risk Factors:Hypertension and                 Dyslipidemia. CKD, stage III.                  Aortic Valve: valve is present in the aortic position. Procedure                 Date: 02/10/22.  Sonographer:    Ronny Flurry Referring Phys: [id number] ASHISH ARORA IMPRESSIONS  1. Left ventricular ejection fraction, by estimation, is 60 to 65%. The left ventricle has normal function. The left ventricle has no regional wall motion abnormalities. There is moderate concentric left ventricular hypertrophy. Left ventricular diastolic function could not be evaluated.  2. Right ventricular systolic function is normal. The right ventricular size is normal. There is normal pulmonary artery systolic pressure.  3. The mitral valve is degenerative. Mild to moderate mitral valve regurgitation. No evidence of mitral stenosis. Moderate to severe mitral annular calcification.  4. Tricuspid valve regurgitation is mild to moderate.  5. S/p TAVR (47mm Medtronic CoreValue-Evolut pro) with well-seated bioprosthetic valve. Aortic valve regurgitation is not visualized. No aortic stenosis is present. There is a valve present in the aortic position. Procedure Date: 02/10/22.  6. The inferior vena cava is dilated in size with <50% respiratory variability, suggesting  right atrial pressure of 15 mmHg. FINDINGS  Left Ventricle: Left ventricular ejection fraction, by  estimation, is 60 to 65%. The left ventricle has normal function. The left ventricle has no regional wall motion abnormalities. The left ventricular internal cavity size was normal in size. There is  moderate concentric left ventricular hypertrophy. Abnormal (paradoxical) septal motion, consistent with left bundle branch block. Left ventricular diastolic function could not be evaluated due to mitral annular calcification (moderate or greater). Left ventricular diastolic function could not be evaluated. Right Ventricle: The right ventricular size is normal. No increase in right ventricular wall thickness. Right ventricular systolic function is normal. There is normal pulmonary artery systolic pressure. The tricuspid regurgitant velocity is 2.26 m/s, and  with an assumed right atrial pressure of 15 mmHg, the estimated right ventricular systolic pressure is 35.4 mmHg. Left Atrium: Left atrial size was normal in size. Right Atrium: Right atrial size was normal in size. Pericardium: There is no evidence of pericardial effusion. Presence of epicardial fat layer. Mitral Valve: The mitral valve is degenerative in appearance. Moderate to severe mitral annular calcification. Mild to moderate mitral valve regurgitation. No evidence of mitral valve stenosis. Tricuspid Valve: The tricuspid valve is normal in structure. Tricuspid valve regurgitation is mild to moderate. No evidence of tricuspid stenosis. Aortic Valve: S/p TAVR (26mm Medtronic CoreValue-Evolut pro) with well-seated bioprosthetic valve. Aortic valve regurgitation is not visualized. No aortic stenosis is present. Aortic valve mean gradient measures 7.0 mmHg. Aortic valve peak gradient measures 15.8 mmHg. Aortic valve area, by VTI measures 1.81 cm. There is a valve present in the aortic position. Procedure Date: 02/10/22. Pulmonic Valve: The pulmonic valve was normal in structure. Pulmonic valve regurgitation is not visualized. No evidence of pulmonic stenosis. Aorta:  The aortic root is normal in size and structure. Venous: The inferior vena cava is dilated in size with less than 50% respiratory variability, suggesting right atrial pressure of 15 mmHg. IAS/Shunts: No atrial level shunt detected by color flow Doppler.  LEFT VENTRICLE PLAX 2D LVIDd:         4.30 cm   Diastology LVIDs:         2.50 cm   LV e' medial:    4.32 cm/s LV PW:         1.70 cm   LV E/e' medial:  25.2 LV IVS:        1.30 cm   LV e' lateral:   5.54 cm/s LVOT diam:     1.70 cm   LV E/e' lateral: 19.7 LV SV:         101 LV SV Index:   53 LVOT Area:     2.27 cm  RIGHT VENTRICLE             IVC RV S prime:     16.00 cm/s  IVC diam: 2.50 cm TAPSE (M-mode): 2.7 cm LEFT ATRIUM             Index        RIGHT ATRIUM           Index LA diam:        3.40 cm 1.78 cm/m   RA Area:     17.00 cm LA Vol (A2C):   39.4 ml 20.66 ml/m  RA Volume:   43.50 ml  22.81 ml/m LA Vol (A4C):   56.1 ml 29.42 ml/m LA Biplane Vol: 50.4 ml 26.43 ml/m  AORTIC VALVE AV Area (Vmax):    1.75 cm AV Area (  Vmean):   2.01 cm AV Area (VTI):     1.81 cm AV Vmax:           199.00 cm/s AV Vmean:          119.000 cm/s AV VTI:            0.557 m AV Peak Grad:      15.8 mmHg AV Mean Grad:      7.0 mmHg LVOT Vmax:         153.50 cm/s LVOT Vmean:        105.500 cm/s LVOT VTI:          0.444 m LVOT/AV VTI ratio: 0.80  AORTA Ao Root diam: 2.20 cm Ao Asc diam:  2.80 cm MITRAL VALVE                TRICUSPID VALVE MV Area (PHT): 2.69 cm     TR Peak grad:   20.4 mmHg MV Decel Time: 282 msec     TR Vmax:        226.00 cm/s MV E velocity: 109.00 cm/s MV A velocity: 118.50 cm/s  SHUNTS MV E/A ratio:  0.92         Systemic VTI:  0.44 m                             Systemic Diam: 1.70 cm Kardie Tobb DO Electronically signed by Thomasene Ripple DO Signature Date/Time: 06/04/2022/2:31:40 PM    Final    IR PERCUTANEOUS ART THROMBECTOMY/INFUSION INTRACRANIAL INC DIAG ANGIO  Result Date: 06/04/2022 INDICATION: 83 year old female with past medical history  significant for recent embolic strokes, sick sinus syndrome, paroxysmal A-fib on anticoagulation with a DOAC, hypertension, hyperlipidemia, obstructive CAD, HFpEF, 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 She was brought to ED after being found unresponsive; last known well 7 a.m. on 06/04/2022. Upon evaluation, leftward gaze, right-sided weakness and aphasia were noted; NIHSS. Head CT showed no large acute infarct or hemorrhage. CT angiogram of the head and neck showed a left ICA terminus occlusion. 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 She was brought to our service for emergency mechanical thrombectomy. EXAM: ULTRASOUND-GUIDED VASCULAR ACCESS DIAGNOSTIC CEREBRAL ANGIOGRAM MECHANICAL THROMBECTOMY FLAT PANEL HEAD CT COMPARISON:  CT/CT angiogram of the head and neck June 04, 2022. MEDICATIONS: 2 g of Ancef IV. The antibiotic was administered within 1 hour of the procedure ANESTHESIA/SEDATION: The procedure was performed under general anesthesia. CONTRAST:  50 mL of Omnipaque 300 milligram/mL FLUOROSCOPY: Radiation Exposure Index (as provided by the fluoroscopic device): 773 mGy Kerma COMPLICATIONS: None immediate. TECHNIQUE: Informed written consent was obtained from the patient's daughter after a thorough discussion of the procedural risks, benefits and alternatives. All questions were addressed. Maximal Sterile Barrier Technique was utilized including caps, mask, sterile gowns, sterile gloves, sterile drape, hand hygiene and skin antiseptic. A timeout was performed prior to the initiation of the procedure. The right groin was prepped and draped in the usual sterile fashion. Using a micropuncture kit and the modified Seldinger technique, access was gained to the right common femoral artery and an 8 French sheath was placed. Real-time ultrasound guidance was utilized for vascular access including the acquisition of a  permanent ultrasound image documenting patency of the accessed vessel. Under fluoroscopy, a Zoom 88 guide catheter was navigated over a 6 [place] VTK catheter and a 0.035" Terumo Glidewire into  the aortic arch. The catheter was placed into the left common carotid artery and then advanced into the left internal carotid artery. The diagnostic catheter was removed. Frontal and lateral angiograms of the head were obtained. Mechanical thrombectomy was then carried out as described below. After mechanical thrombectomy, a 5 [place] Berenstein 2 catheter was navigated over a Terumo Glidewire into the aortic arch. The catheter was placed into the left subclavian artery and then advanced into the left vertebral artery. Frontal and lateral angiograms of the head obtained. The catheter was subsequently withdrawn. Right common femoral artery angiogram was obtained in right anterior oblique view. The puncture is at the level of the common femoral artery. The artery has normal caliber, adequate for closure device. The sheath was exchanged over the wire for a Perclose prostyle which was utilized for access closure. Immediate hemostasis was achieved. FINDINGS: 1. Normal caliber of the right common femoral artery, likely for vascular access. 2. Atherosclerotic changes of the left carotid bifurcation without hemodynamically significant stenosis. 3. 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 4. Near occlusive and chronic appearing stenosis of the bilateral P1/PCA segments. PROCEDURE: Using biplane roadmap guidance, a zoom 71 aspiration catheter was navigated over Colossus 35 microguidewire into the cavernous segment of the left ICA. The aspiration catheter was then advanced to the level of occlusion and connected to an aspiration pump. Continuous aspiration was performed for 2 minutes. The guide catheter was connected to a VacLok syringe. The aspiration catheter was subsequently removed under constant  aspiration. The guide catheter was aspirated for debris. 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 Using biplane roadmap guidance, a zoom 71 aspiration catheter was navigated over a phenom 21 microcatheter and an Aristotle 14 microguidewire into the cavernous segment of the left ICA. The microcatheter was then navigated over the wire into the left M2/MCA posterior division branch. Then, a 4 x 40 mm solitaire stent retriever was deployed spanning the ICA terminus, M1 and proximal M2 segments. The device was allowed to intercalated with the clot for 4 minutes. The microcatheter was removed. The aspiration catheter was advanced to the level of occlusion and connected to an aspiration pump. The thrombectomy device and aspiration catheter were removed under constant aspiration. The guide catheter was aspirated for debris. Left internal carotid artery angiograms with frontal lateral views of the head showed complete recanalization of the left ICA, MCA/ACA vascular trees. Flat panel CT of the head was obtained and post processed in a separate workstation with concurrent attending physician supervision. Selected images were sent to PACS. No evidence of hemorrhagic complication. IMPRESSION: Successful mechanical thrombectomy for treatment of a left ICA terminus occlusion with complete recanalization at the 2 passes (TICI 3). PLAN: Patient transferred to ICU for continued post stroke care. Electronically Signed   By: Baldemar Lenis M.D.   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 She was brought to ED after being found unresponsive; last known well 7 a.m. on 06/04/2022. Upon evaluation, leftward gaze, right-sided weakness and aphasia were noted; NIHSS. Head CT showed no large acute  infarct or hemorrhage. CT angiogram of the head and neck showed a left ICA terminus occlusion. 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 She was brought to our service for emergency mechanical thrombectomy. EXAM: ULTRASOUND-GUIDED VASCULAR ACCESS DIAGNOSTIC CEREBRAL ANGIOGRAM MECHANICAL THROMBECTOMY FLAT PANEL HEAD CT COMPARISON:  CT/CT angiogram of the head and neck June 04, 2022. MEDICATIONS: 2 g of Ancef IV. The antibiotic was administered within 1 hour of the procedure ANESTHESIA/SEDATION: The procedure was performed under general anesthesia. CONTRAST:  50 mL of Omnipaque 300 milligram/mL FLUOROSCOPY: Radiation Exposure Index (as provided by the fluoroscopic device): 773 mGy Kerma COMPLICATIONS: None immediate. TECHNIQUE: Informed written consent was obtained from the patient's daughter after a thorough discussion of the procedural risks, benefits and alternatives. All questions were addressed. Maximal Sterile Barrier Technique was utilized including caps, mask, sterile gowns, sterile gloves, sterile drape, hand hygiene and skin antiseptic. A timeout was performed prior to the initiation of the procedure. The right groin was prepped and draped in the usual sterile fashion. Using a micropuncture kit and the modified Seldinger technique, access was gained to the right common femoral artery and an 8 French sheath was placed. Real-time ultrasound guidance was utilized for vascular access including the acquisition of a permanent ultrasound image documenting patency of the accessed vessel. Under fluoroscopy, a Zoom 88 guide catheter was navigated over a 6 [place] VTK catheter and a 0.035" Terumo Glidewire into  the aortic arch. The catheter was placed into the left common carotid artery and then advanced into the left internal carotid artery. The diagnostic catheter was removed. Frontal and lateral angiograms of the head were obtained. Mechanical thrombectomy was then carried out as described below. After mechanical thrombectomy, a 5 [place] Berenstein 2 catheter was navigated over a Terumo Glidewire into the aortic arch. The catheter was placed into the left subclavian artery and then advanced into the left vertebral artery. Frontal and lateral angiograms of the head obtained. The catheter was subsequently withdrawn. Right common femoral artery angiogram was obtained in right anterior oblique view. The puncture is at the level of the common femoral artery. The artery has normal caliber, adequate for closure device. The sheath was exchanged over the wire for a Perclose prostyle which was utilized for access closure. Immediate hemostasis was achieved. FINDINGS: 1. Normal caliber of the right common femoral artery, likely for vascular access. 2. Atherosclerotic changes of the left carotid bifurcation without hemodynamically significant stenosis. 3. 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 4. Near occlusive and chronic appearing stenosis of the bilateral P1/PCA segments. PROCEDURE: Using biplane roadmap guidance, a zoom 71 aspiration catheter was navigated over Colossus 35 microguidewire into the cavernous segment of the left ICA. The aspiration catheter was then advanced to the level of occlusion and connected to an aspiration pump. Continuous aspiration was performed for 2 minutes. The guide catheter was connected to a VacLok syringe. The aspiration catheter was subsequently removed under constant aspiration. The guide catheter was aspirated for debris. The left internal carotid artery angiograms with frontal lateral views of the head showed persistent occlusion of the left ICA terminus distal to  the origin of the anterior choroidal artery. Using biplane roadmap guidance, a zoom 71 aspiration catheter was navigated over a phenom 21 microcatheter and an Aristotle 14 microguidewire into the cavernous segment of the left ICA. The microcatheter was then navigated over the wire into the left M2/MCA posterior division branch. Then, a 4 x 40 mm solitaire stent  retriever was deployed spanning the ICA terminus, M1 and proximal M2 segments. The device was allowed to intercalated with the clot for 4 minutes. The microcatheter was removed. The aspiration catheter was advanced to the level of occlusion and connected to an aspiration pump. The thrombectomy device and aspiration catheter were removed under constant aspiration. The guide catheter was aspirated for debris. Left internal carotid artery angiograms with frontal lateral views of the head showed complete recanalization of the left ICA, MCA/ACA vascular trees. Flat panel CT of the head was obtained and post processed in a separate workstation with concurrent attending physician supervision. Selected images were sent to PACS. No evidence of hemorrhagic complication. IMPRESSION: Successful mechanical thrombectomy for treatment of a left ICA terminus occlusion with complete recanalization at the 2 passes (TICI 3). PLAN: Patient transferred to ICU for continued post stroke care. Electronically Signed   By: Baldemar Lenis M.D.   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 She was brought to ED after being found unresponsive; last known well 7 a.m. on 06/04/2022. Upon evaluation, leftward gaze, right-sided weakness and aphasia  were noted; NIHSS. Head CT showed no large acute infarct or hemorrhage. CT angiogram of the head and neck showed a left ICA terminus occlusion. 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 She was brought to our service for emergency mechanical thrombectomy. EXAM: ULTRASOUND-GUIDED VASCULAR ACCESS DIAGNOSTIC CEREBRAL ANGIOGRAM MECHANICAL THROMBECTOMY FLAT PANEL HEAD CT COMPARISON:  CT/CT angiogram of the head and neck June 04, 2022. MEDICATIONS: 2 g of Ancef IV. The antibiotic was administered within 1 hour of the procedure ANESTHESIA/SEDATION: The procedure was performed under general anesthesia. CONTRAST:  50 mL of Omnipaque 300 milligram/mL FLUOROSCOPY: Radiation Exposure Index (as provided by the fluoroscopic device): 773 mGy Kerma COMPLICATIONS: None immediate. TECHNIQUE: Informed written consent was obtained from the patient's daughter after a thorough discussion of the procedural risks, benefits and alternatives. All questions were addressed. Maximal Sterile Barrier Technique was utilized including caps, mask, sterile gowns, sterile gloves, sterile drape, hand hygiene and skin antiseptic. A timeout was performed prior to the initiation of the procedure. The right groin was prepped and draped in the usual sterile fashion. Using a micropuncture kit and the modified Seldinger technique, access was gained to the right common femoral artery and an 8 French sheath was placed. Real-time ultrasound guidance was utilized for vascular access including the acquisition of a permanent ultrasound image documenting patency of the accessed vessel. Under fluoroscopy, a Zoom 88 guide catheter was navigated over a 6 [place] VTK catheter and a 0.035" Terumo Glidewire into the aortic arch. The catheter was placed into the left common carotid artery and then advanced into the left internal carotid artery. The diagnostic catheter was removed. Frontal and lateral angiograms of the head were  obtained. Mechanical thrombectomy was then carried out as described below. After mechanical thrombectomy, a 5 [place] Berenstein 2 catheter was navigated over a Terumo Glidewire into the aortic arch. The catheter was placed into the left subclavian artery and then advanced into the left vertebral artery. Frontal and lateral angiograms of the head obtained. The catheter was subsequently withdrawn. Right common femoral artery angiogram was obtained in right anterior oblique view.  The puncture is at the level of the common femoral artery. The artery has normal caliber, adequate for closure device. The sheath was exchanged over the wire for a Perclose prostyle which was utilized for access closure. Immediate hemostasis was achieved. FINDINGS: 1. Normal caliber of the right common femoral artery, likely for vascular access. 2. Atherosclerotic changes of the left carotid bifurcation without hemodynamically significant stenosis. 3. 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 4. Near occlusive and chronic appearing stenosis of the bilateral P1/PCA segments. PROCEDURE: Using biplane roadmap guidance, a zoom 71 aspiration catheter was navigated over Colossus 35 microguidewire into the cavernous segment of the left ICA. The aspiration catheter was then advanced to the level of occlusion and connected to an aspiration pump. Continuous aspiration was performed for 2 minutes. The guide catheter was connected to a VacLok syringe. The aspiration catheter was subsequently removed under constant aspiration. The guide catheter was aspirated for debris. 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 Using biplane roadmap guidance, a zoom 71 aspiration catheter was navigated over a phenom 21 microcatheter and an Aristotle 14 microguidewire into the cavernous segment of  the left ICA. The microcatheter was then navigated over the wire into the left M2/MCA posterior division branch. Then, a 4 x 40 mm solitaire stent retriever was deployed spanning the ICA terminus, M1 and proximal M2 segments. The device was allowed to intercalated with the clot for 4 minutes. The microcatheter was removed. The aspiration catheter was advanced to the level of occlusion and connected to an aspiration pump. The thrombectomy device and aspiration catheter were removed under constant aspiration. The guide catheter was aspirated for debris. Left internal carotid artery angiograms with frontal lateral views of the head showed complete recanalization of the left ICA, MCA/ACA vascular trees. Flat panel CT of the head was obtained and post processed in a separate workstation with concurrent attending physician supervision. Selected images were sent to PACS. No evidence of hemorrhagic complication. IMPRESSION: Successful mechanical thrombectomy for treatment of a left ICA terminus occlusion with complete recanalization at the 2 passes (TICI 3). PLAN: Patient transferred to ICU for continued post stroke care. Electronically Signed   By: Baldemar LenisKatyucia  de Macedo Rodrigues M.D.   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 She was brought to ED after being found unresponsive; last known well 7 a.m. on 06/04/2022. Upon evaluation, leftward gaze, right-sided weakness and aphasia were noted; NIHSS. Head CT showed no large acute infarct or hemorrhage. CT angiogram of the head and neck showed a left ICA terminus occlusion. Bilateral PCA occlusion is were also  seen, however, these are likely chronic. She was brought to our service for emergency mechanical thrombectomy. EXAM: ULTRASOUND-GUIDED VASCULAR ACCESS DIAGNOSTIC CEREBRAL ANGIOGRAM MECHANICAL THROMBECTOMY FLAT PANEL HEAD CT COMPARISON:  CT/CT angiogram of the head and neck June 04, 2022. MEDICATIONS: 2 g of Ancef IV. The antibiotic was administered within 1 hour of the procedure ANESTHESIA/SEDATION: The procedure was performed under general anesthesia. CONTRAST:  50 mL of Omnipaque 300 milligram/mL FLUOROSCOPY: Radiation Exposure Index (as provided by the  fluoroscopic device): 773 mGy Kerma COMPLICATIONS: None immediate. TECHNIQUE: Informed written consent was obtained from the patient's daughter after a thorough discussion of the procedural risks, benefits and alternatives. All questions were addressed. Maximal Sterile Barrier Technique was utilized including caps, mask, sterile gowns, sterile gloves, sterile drape, hand hygiene and skin antiseptic. A timeout was performed prior to the initiation of the procedure. The right groin was prepped and draped in the usual sterile fashion. Using a micropuncture kit and the modified Seldinger technique, access was gained to the right common femoral artery and an 8 French sheath was placed. Real-time ultrasound guidance was utilized for vascular access including the acquisition of a permanent ultrasound image documenting patency of the accessed vessel. Under fluoroscopy, a Zoom 88 guide catheter was navigated over a 6 [place] VTK catheter and a 0.035" Terumo Glidewire into the aortic arch. The catheter was placed into the left common carotid artery and then advanced into the left internal carotid artery. The diagnostic catheter was removed. Frontal and lateral angiograms of the head were obtained. Mechanical thrombectomy was then carried out as described below. After mechanical thrombectomy, a 5 [place] Berenstein 2 catheter was navigated over a Terumo Glidewire into the  aortic arch. The catheter was placed into the left subclavian artery and then advanced into the left vertebral artery. Frontal and lateral angiograms of the head obtained. The catheter was subsequently withdrawn. Right common femoral artery angiogram was obtained in right anterior oblique view. The puncture is at the level of the common femoral artery. The artery has normal caliber, adequate for closure device. The sheath was exchanged over the wire for a Perclose prostyle which was utilized for access closure. Immediate hemostasis was achieved. FINDINGS: 1. Normal caliber of the right common femoral artery, likely for vascular access. 2. Atherosclerotic changes of the left carotid bifurcation without hemodynamically significant stenosis. 3. 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 4. Near occlusive and chronic appearing stenosis of the bilateral P1/PCA segments. PROCEDURE: Using biplane roadmap guidance, a zoom 71 aspiration catheter was navigated over Colossus 35 microguidewire into the cavernous segment of the left ICA. The aspiration catheter was then advanced to the level of occlusion and connected to an aspiration pump. Continuous aspiration was performed for 2 minutes. The guide catheter was connected to a VacLok syringe. The aspiration catheter was subsequently removed under constant aspiration. The guide catheter was aspirated for debris. 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 Using biplane roadmap guidance, a zoom 71 aspiration catheter was navigated over a phenom 21 microcatheter and an Aristotle 14 microguidewire into the cavernous segment of the left ICA. The microcatheter was then navigated over the wire into the left M2/MCA posterior division branch. Then, a 4 x 40 mm solitaire stent retriever was deployed spanning the ICA  terminus, M1 and proximal M2 segments. The device was allowed to intercalated with the clot for 4 minutes. The microcatheter was removed. The aspiration catheter was advanced to the level of occlusion and connected to an aspiration pump. The thrombectomy device and aspiration catheter were removed under constant aspiration. The guide catheter was aspirated for debris. Left internal carotid artery angiograms with frontal lateral views of the head showed complete recanalization of the left ICA, MCA/ACA vascular trees. Flat panel CT of the head was obtained and post processed in a separate workstation with concurrent attending physician  supervision. Selected images were sent to PACS. No evidence of hemorrhagic complication. IMPRESSION: Successful mechanical thrombectomy for treatment of a left ICA terminus occlusion with complete recanalization at the 2 passes (TICI 3). PLAN: Patient transferred to ICU for continued post stroke care. Electronically Signed   By: Baldemar Lenis M.D.   On: 06/04/2022 12:19   DG CHEST PORT 1 VIEW  Result Date: 06/04/2022 CLINICAL DATA:  Endotracheal tube placement. EXAM: PORTABLE CHEST 1 VIEW COMPARISON:  May 10, 2022. FINDINGS: Stable cardiomegaly. Endotracheal and nasogastric tubes are in grossly good position. Lungs are clear. Bony thorax is unremarkable. Status post transcatheter aortic valve repair. IMPRESSION: Endotracheal and nasogastric tubes are in grossly good position. Electronically Signed   By: Lupita Raider M.D.   On: 06/04/2022 11:01   DG Abd 1 View  Result Date: 06/04/2022 CLINICAL DATA:  Enteric tube placement EXAM: ABDOMEN - 1 VIEW COMPARISON:  None Available. FINDINGS: Enteric tube tip projects over the distal stomach. Endotracheal tube tip projects 1.5 cm above the carina. Status post aortic valve replacement. Mitral annular calcifications. Partially imaged lung bases appear clear. Excreted renal contrast within the collecting systems.  Paucity of bowel gas in the partially imaged upper abdomen. IMPRESSION: Enteric tube tip projects over the distal stomach. Electronically Signed   By: Agustin Cree M.D.   On: 06/04/2022 11:00   CT HEAD CODE STROKE WO CONTRAST  Addendum Date: 06/04/2022   ADDENDUM REPORT: 06/04/2022 09:29 ADDENDUM: ASPECTS score on the initial noncontrast head CT is 10. Electronically Signed   By: Lesia Hausen M.D.   On: 06/04/2022 09:29   Result Date: 06/04/2022 CLINICAL DATA:  Code stroke.  Neuro deficit EXAM: CT ANGIOGRAPHY HEAD AND NECK TECHNIQUE: Multidetector CT imaging of the head and neck was performed using the standard protocol during bolus administration of intravenous contrast. Multiplanar CT image reconstructions and MIPs were obtained to evaluate the vascular anatomy. Carotid stenosis measurements (when applicable) are obtained utilizing NASCET criteria, using the distal internal carotid diameter as the denominator. RADIATION DOSE REDUCTION: This exam was performed according to the departmental dose-optimization program which includes automated exposure control, adjustment of the mA and/or kV according to patient size and/or use of iterative reconstruction technique. CONTRAST:  75 cc Omnipaque 350 COMPARISON:  Brain MRI 05/10/2022 FINDINGS: CT HEAD FINDINGS Image quality is degraded by motion artifact. Brain: There is no evidence of acute intracranial hemorrhage, extra-axial fluid collection, or acute territorial infarct. Parenchymal volume is stable. The ventricles are stable in size. Small remote infarcts in the bilateral corona radiata and cerebellar hemispheres are again seen. Additional patchy hypodensity in the supratentorial white matter likely reflects underlying chronic small vessel ischemic change. There is no mass lesion.  There is no mass effect or midline shift. Vascular: See below. Skull: Normal. Negative for fracture or focal lesion. Sinuses/Orbits: The paranasal sinuses are clear. The globes and  orbits are unremarkable. Other: None. Review of the MIP images confirms the above findings CTA NECK FINDINGS Aortic arch: There is calcified plaque in the aortic arch. The origins of the major branch vessels are patent. The subclavian arteries are patent to the level imaged. Right carotid system: The right common, internal, and external carotid arteries are patent, with mild plaque of the bifurcation but no hemodynamically significant stenosis or occlusion. There is no dissection or aneurysm. Left carotid system: The left common, internal, and external carotid arteries are patent with mild plaque of the bifurcation but no hemodynamically significant stenosis or occlusion. There  is no dissection or aneurysm. Vertebral arteries: There is calcified plaque of the vertebral artery origins without hemodynamically significant stenosis or occlusion. The left vertebral artery is dominant, a normal variant. There is no hemodynamically significant stenosis or occlusion. The vertebral arteries are patent, with no dissection or aneurysm. Skeleton: There is no acute osseous abnormality or suspicious osseous lesion. Other neck: The soft tissues of the neck are unremarkable. Upper chest: There is a right pleural effusion, incompletely imaged. There is interlobular septal thickening which may reflect pulmonary interstitial edema. Review of the MIP images confirms the above findings CTA HEAD FINDINGS Anterior circulation: The left ICA is occluded at the supraclinoid segment extending to the M1 segment. There is reconstitution of flow in the distal M1 segment with further occlusion of M2 branches in the sylvian fissure (8-104). There is diminutive reconstitution of flow in more distal MCA branches. The right intracranial ICA is patent with calcified plaque but no hemodynamically significant stenosis or occlusion. The right MCA is patent, without proximal high-grade stenosis or occlusion. The bilateral ACAs are patent. There is focal  high-grade stenosis of the left A3 segment (8-96). The left A1 segment is likely fills retrograde via the anterior communicating artery. There is no aneurysm or AVM. Posterior circulation: There is calcified plaque in the left V4 segment resulting in moderate stenosis. The non dominant right V4 segment is patent. The basilar artery is patent. The major cerebellar arteries are patent. Both PCAs appear occluded shortly after their origins with diminutive reconstitution of flow in distal vessels. There is no aneurysm or AVM. Venous sinuses: As permitted by contrast timing, patent. Anatomic variants: None. Review of the MIP images confirms the above findings IMPRESSION: 1. No definite evidence of evolved left MCA infarct on motion degraded initial noncontrast head CT. 2. 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 There is diminutive reconstitution of flow in more distal MCA branches. 3. Occluded bilateral PCAs shortly after their origins with diminutive reconstitution of flow in distal vessels, favored chronic. 4. Focal high-grade stenosis of the left A3 segment. 5. Calcified plaque at the carotid bifurcations, left worse than right, and the bilateral vertebral artery origins without hemodynamically significant stenosis. 6. Incompletely imaged right pleural effusion and mild pulmonary interstitial edema. Findings communicated to Dr. Wilford Corner 8:53 am. Electronically Signed: By: Lesia Hausen M.D. On: 06/04/2022 09:09   CT ANGIO HEAD NECK W WO CM (CODE STROKE)  Addendum Date: 06/04/2022   ADDENDUM REPORT: 06/04/2022 09:29 ADDENDUM: ASPECTS score on the initial noncontrast head CT is 10. Electronically Signed   By: Lesia Hausen M.D.   On: 06/04/2022 09:29   Result Date: 06/04/2022 CLINICAL DATA:  Code stroke.  Neuro deficit EXAM: CT ANGIOGRAPHY HEAD AND NECK TECHNIQUE: Multidetector CT imaging of the head  and neck was performed using the standard protocol during bolus administration of intravenous contrast. Multiplanar CT image reconstructions and MIPs were obtained to evaluate the vascular anatomy. Carotid stenosis measurements (when applicable) are obtained utilizing NASCET criteria, using the distal internal carotid diameter as the denominator. RADIATION DOSE REDUCTION: This exam was performed according to the departmental dose-optimization program which includes automated exposure control, adjustment of the mA and/or kV according to patient size and/or use of iterative reconstruction technique. CONTRAST:  75 cc Omnipaque 350 COMPARISON:  Brain MRI 05/10/2022 FINDINGS: CT HEAD FINDINGS Image quality is degraded by motion artifact. Brain: There is no evidence  of acute intracranial hemorrhage, extra-axial fluid collection, or acute territorial infarct. Parenchymal volume is stable. The ventricles are stable in size. Small remote infarcts in the bilateral corona radiata and cerebellar hemispheres are again seen. Additional patchy hypodensity in the supratentorial white matter likely reflects underlying chronic small vessel ischemic change. There is no mass lesion.  There is no mass effect or midline shift. Vascular: See below. Skull: Normal. Negative for fracture or focal lesion. Sinuses/Orbits: The paranasal sinuses are clear. The globes and orbits are unremarkable. Other: None. Review of the MIP images confirms the above findings CTA NECK FINDINGS Aortic arch: There is calcified plaque in the aortic arch. The origins of the major branch vessels are patent. The subclavian arteries are patent to the level imaged. Right carotid system: The right common, internal, and external carotid arteries are patent, with mild plaque of the bifurcation but no hemodynamically significant stenosis or occlusion. There is no dissection or aneurysm. Left carotid system: The left common, internal, and external carotid arteries are  patent with mild plaque of the bifurcation but no hemodynamically significant stenosis or occlusion. There is no dissection or aneurysm. Vertebral arteries: There is calcified plaque of the vertebral artery origins without hemodynamically significant stenosis or occlusion. The left vertebral artery is dominant, a normal variant. There is no hemodynamically significant stenosis or occlusion. The vertebral arteries are patent, with no dissection or aneurysm. Skeleton: There is no acute osseous abnormality or suspicious osseous lesion. Other neck: The soft tissues of the neck are unremarkable. Upper chest: There is a right pleural effusion, incompletely imaged. There is interlobular septal thickening which may reflect pulmonary interstitial edema. Review of the MIP images confirms the above findings CTA HEAD FINDINGS Anterior circulation: The left ICA is occluded at the supraclinoid segment extending to the M1 segment. There is reconstitution of flow in the distal M1 segment with further occlusion of M2 branches in the sylvian fissure (8-104). There is diminutive reconstitution of flow in more distal MCA branches. The right intracranial ICA is patent with calcified plaque but no hemodynamically significant stenosis or occlusion. The right MCA is patent, without proximal high-grade stenosis or occlusion. The bilateral ACAs are patent. There is focal high-grade stenosis of the left A3 segment (8-96). The left A1 segment is likely fills retrograde via the anterior communicating artery. There is no aneurysm or AVM. Posterior circulation: There is calcified plaque in the left V4 segment resulting in moderate stenosis. The non dominant right V4 segment is patent. The basilar artery is patent. The major cerebellar arteries are patent. Both PCAs appear occluded shortly after their origins with diminutive reconstitution of flow in distal vessels. There is no aneurysm or AVM. Venous sinuses: As permitted by contrast timing,  patent. Anatomic variants: None. Review of the MIP images confirms the above findings IMPRESSION: 1. No definite evidence of evolved left MCA infarct on motion degraded initial noncontrast head CT. 2. 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 There is diminutive reconstitution of flow in more distal MCA branches. 3. Occluded bilateral PCAs shortly after their origins with diminutive reconstitution of flow in distal vessels, favored chronic. 4. Focal high-grade stenosis of the left A3 segment. 5. Calcified plaque at the carotid bifurcations, left worse than right, and the bilateral vertebral artery origins without hemodynamically significant stenosis. 6. Incompletely imaged right pleural effusion and mild pulmonary interstitial edema. Findings communicated to Dr. Rory Percy 8:53 am.  Electronically Signed: By: Lesia Hausen M.D. On: 06/04/2022 09:09    Labs:  Basic Metabolic Panel: Recent Labs  Lab 06/24/22 0733 06/25/22 1518  NA 139 138  K 3.7 3.9  CL 105 104  CO2 27 26  GLUCOSE 101* 116*  BUN 12 17  CREATININE 0.95 1.04*  CALCIUM 8.8* 8.7*    CBC: Recent Labs  Lab 06/24/22 0733 06/25/22 1518  WBC 8.0 7.7  HGB 11.3* 11.7*  HCT 33.0* 34.8*  MCV 86.4 87.2  PLT 224 216    CBG: Recent Labs  Lab 06/25/22 1315 06/25/22 2008 06/26/22 0403  GLUCAP 115* 105* 94   Family history.  Mother with dementia father with CAD.  Denies any colon cancer esophageal cancer or rectal cancer  Brief HPI:   Beyonka Pitney is a 82 y.o. 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  Per chart review lives with spouse.  Ambulatory with a rolling walker.  Presented 06/04/2022 with altered mental status left 4 days and right side weakness/aphasia.  Cranial CT scan showed no definite evidence of involved left MCA infarction on motion degraded initial contrast head CT.  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  Occluded bilateral PCA shortly after their origins with diminutive reconstitution of flow in distal vessels favored to be chronic.  Focal high-grade stenosis of the left A3 segment.  Patient underwent revascularization per interventional radiology.  Echocardiogram with ejection fraction of 60 to 65% no wall motion abnormalities.  Admission chemistries unremarkable except glucose 108.  Neurology follow-up remained on Eliquis as prior to admission.  Zio patch with no atrial fibrillation detected.  Tolerating a regular consistency diet.  Therapy evaluations completed due to patient decreased functional mobility right side weakness was admitted for a comprehensive rehab program.   Hospital Course: Joya Willmott was admitted to rehab 06/26/2022 for inpatient therapies to consist of PT, ST and OT at least three hours five days a week. Past admission physiatrist, therapy team and rehab RN have worked together to provide customized collaborative inpatient rehab.  Pertaining to patient's bilateral left greater than right acute scattered punctate infarct due to left ICA and MCA occlusion status post revascularization as well as history of ischemic cerebrovascular accident December 2023-embolic stroke DOAC failure.  Patient remained on Eliquis for both CVA prophylaxis history of PAF.  Bouts of bradycardia with cardiology continuing to follow no current plans for pacemaker  discussed with family.  Patient remained on amiodarone.  CKD baseline creatinine 1.20-1.30 latest creatinine 1.04.  Synthroid ongoing for hypothyroidism.  Patient with history of diastolic congestive heart failure maintained on Entresto exhibiting no signs of fluid overload latest chest x-ray unremarkable.  Lipitor for hyperlipidemia as indicated.  Hospital course complicated by RSV on droplet precautions monitoring of oxygen saturations with precautions discontinued 1/15.  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  On the afternoon of 06/25/2022 while patient sitting up in chair with husband at bedside became unresponsive.  Patient placed back in  the bed.  Blood pressure maintaining oxygen saturation 97% chest x-ray unremarkable cranial CT scan showed no acute changes.  There was question of possible seizure with neurology services consulted and EEG was ordered patient was loaded with Keppra.  Plans for follow-up MRI at that time ordered per neurology services.  Throughout the evening patient having difficulty with decreasing mental status inability to maintain airway with critical care medicine consulted and patient was discharged to acute care service for ongoing care.  MRI results of 06/25/2022 did show new from prior MRI of 06/05/2022 symmetric acute infarcts within the medial thalami as well as subcentimeter acute infarct within the inferior right cerebellar hemisphere.  EEG was pending patient did remain on Eliquis therapy at time of discharge to acute care services   Blood pressures were monitored on TID basis and monitored     Rehab course: During patient's stay in rehab weekly team conferences were held to monitor patient's progress, set goals and discuss barriers to discharge. At admission, patient required moderate assist step pivot transfers minimal assist supine to sit moderate  assist sit to supine  He/She  has had improvement in activity tolerance, balance, postural control as well as ability to compensate for deficits. He/She has had improvement in functional use RUE/LUE  and RLE/LLE as well as improvement in awareness.  Latest therapy notes sit to stand moderate assist ambulates minimal assist and moderate motivation provided.  Completed squat pivot transfers to bedside commode over toilet moderate assist and moderate cues for technique and body positioning.  Max assist to doff pants and partial stand.  Patient completes handwashing at sink side grooming hygiene task with assistance.  Follow-up speech therapy patient was able to select the word to match his definition given field of 3 written choices 100% accuracy given moderate assist verbal and written cues this was established 06/23/2022.       Disposition: Discharged to acute care services    Diet: N.p.o.  Special Instructions: As per recommendations of critical care medicine  30-35 minutes were spent completing discharge summary and discharge planning      Signed: Charlton Amor 06/26/2022, 5:20 AM

## 2022-06-26 NOTE — H&P (Signed)
See H&P written at 1/18 at 22:35

## 2022-06-26 NOTE — Discharge Summary (Signed)
  The note originally documented on this encounter has been moved the the encounter in which it belongs.

## 2022-06-26 NOTE — Progress Notes (Signed)
     Referral received for Rebekah Pope for goals of care discussion. Chart reviewed and messaged with Dr. Carlis Abbott of PCCM.   [place] meeting was had by Dr. Carlis Abbott today. Goals seem clear: DNR/DNI. Pt would not want to be dependent on machines or nursing home dependent. Another daughter coming from [place] (snow delay). Daughter wants full code/scope until she arrives but patient's husband is firm in DNR/DNI status and if she gets uncomfortable will transition her to comfort. Otherwise, will continue to treat the treatable until daughter arrives.  PMT will re-attempt to contact family at a later time/date. Detailed note and recommendations to follow once GOC has been completed.   Thank you for your referral and allowing PMT to assist in [place] care.   Walden Field, NP Palliative Medicine Team Phone: [phone]  NO CHARGE

## 2022-06-26 NOTE — Progress Notes (Signed)
eLink Physician-Brief Progress Note Patient Name: Rebekah Pope DOB: 04-Mar-1941 MRN: [id number]   Date of Service  06/26/2022  HPI/Events of Note  Patient with a past history of CVA's s/p thrombectomy admitted today with altered mental status and found to have new ischemic strokes, she also has profound sinus bradycardia, and a marginal respiratory status at this point.  eICU Interventions  New Patient Evaluation.        Rebekah Pope 06/26/2022, 12:42 AM

## 2022-06-27 DIAGNOSIS — Z7189 Other specified counseling: Secondary | ICD-10-CM

## 2022-06-27 DIAGNOSIS — I10 Essential (primary) hypertension: Secondary | ICD-10-CM

## 2022-06-27 DIAGNOSIS — I6381 Other cerebral infarction due to occlusion or stenosis of small artery: Secondary | ICD-10-CM | POA: Diagnosis not present

## 2022-06-27 DIAGNOSIS — R001 Bradycardia, unspecified: Secondary | ICD-10-CM

## 2022-06-27 DIAGNOSIS — Z515 Encounter for palliative care: Secondary | ICD-10-CM | POA: Diagnosis not present

## 2022-06-27 LAB — CBC
HCT: 33.3 % — ABNORMAL LOW (ref 36.0–46.0)
Hemoglobin: 10.9 g/dL — ABNORMAL LOW (ref 12.0–15.0)
MCH: 29.7 pg (ref 26.0–34.0)
MCHC: 32.7 g/dL (ref 30.0–36.0)
MCV: 90.7 fL (ref 80.0–100.0)
Platelets: 192 10*3/uL (ref 150–400)
RBC: 3.67 MIL/uL — ABNORMAL LOW (ref 3.87–5.11)
RDW: 13.6 % (ref 11.5–15.5)
WBC: 9.4 10*3/uL (ref 4.0–10.5)
nRBC: 0 % (ref 0.0–0.2)

## 2022-06-27 LAB — BASIC METABOLIC PANEL
Anion gap: 8 (ref 5–15)
BUN: 15 mg/dL (ref 8–23)
CO2: 23 mmol/L (ref 22–32)
Calcium: 8.6 mg/dL — ABNORMAL LOW (ref 8.9–10.3)
Chloride: 107 mmol/L (ref 98–111)
Creatinine, Ser: 0.87 mg/dL (ref 0.44–1.00)
GFR, Estimated: >60 mL/min (ref >60)
Glucose, Bld: 91 mg/dL (ref 70–99)
Potassium: 3.8 mmol/L (ref 3.5–5.1)
Sodium: 138 mmol/L (ref 135–145)

## 2022-06-27 LAB — LIPID PANEL
Cholesterol: 114 mg/dL (ref 0–200)
HDL: 46 mg/dL (ref >40)
LDL Cholesterol: 47 mg/dL (ref 0–99)
Total CHOL/HDL Ratio: 2.5 RATIO
Triglycerides: 105 mg/dL (ref <150)
VLDL: 21 mg/dL (ref 0–40)

## 2022-06-27 LAB — URINE CULTURE: Culture: >=100000 — AB

## 2022-06-27 LAB — HEMOGLOBIN A1C
Hgb A1c MFr Bld: 5.9 % — ABNORMAL HIGH (ref 4.8–5.6)
Mean Plasma Glucose: 122.63 mg/dL

## 2022-06-27 LAB — MAGNESIUM: Magnesium: 2 mg/dL (ref 1.7–2.4)

## 2022-06-27 LAB — GLUCOSE, CAPILLARY
Glucose-Capillary: 84 mg/dL (ref 70–99)
Glucose-Capillary: 75 mg/dL (ref 70–99)

## 2022-06-27 LAB — PHOSPHORUS: Phosphorus: 3.9 mg/dL (ref 2.5–4.6)

## 2022-06-27 MED ORDER — LORAZEPAM 2 MG/ML IJ SOLN
1.0000 mg | Freq: Three times a day (TID) | INTRAMUSCULAR | Status: DC
  Administered 2022-06-28: 1 mg via INTRAVENOUS
  Filled 2022-06-27: qty 1

## 2022-06-27 MED ORDER — BIOTENE DRY MOUTH MT LIQD: 15.0000 mL | OROMUCOSAL | Status: DC | PRN

## 2022-06-27 MED ORDER — LORAZEPAM 2 MG/ML IJ SOLN: 0.5000 mg | INTRAMUSCULAR | Status: DC | PRN

## 2022-06-27 MED ORDER — GLYCOPYRROLATE 1 MG PO TABS: 1.0000 mg | ORAL_TABLET | ORAL | Status: DC | PRN

## 2022-06-27 MED ORDER — POTASSIUM CHLORIDE 10 MEQ/50ML IV SOLN: 10.0000 meq | INTRAVENOUS | Status: DC

## 2022-06-27 MED ORDER — MORPHINE SULFATE (PF) 2 MG/ML IV SOLN
2.0000 mg | Freq: Three times a day (TID) | INTRAVENOUS | Status: DC
  Administered 2022-06-28: 2 mg via INTRAVENOUS
  Filled 2022-06-27: qty 1

## 2022-06-27 MED ORDER — GLYCOPYRROLATE 0.2 MG/ML IJ SOLN
0.2000 mg | INTRAMUSCULAR | Status: DC | PRN
  Administered 2022-06-27 – 2022-06-28 (×2): 0.2 mg via INTRAVENOUS
  Filled 2022-06-27: qty 1

## 2022-06-27 MED ORDER — MIDAZOLAM HCL 2 MG/2ML IJ SOLN: 4.0000 mg | Freq: Once | INTRAMUSCULAR | Status: DC

## 2022-06-27 MED ORDER — POLYVINYL ALCOHOL 1.4 % OP SOLN: 1.0000 [drp] | Freq: Four times a day (QID) | OPHTHALMIC | Status: DC | PRN

## 2022-06-27 MED ORDER — MORPHINE SULFATE (PF) 2 MG/ML IV SOLN
1.0000 mg | INTRAVENOUS | Status: DC | PRN
  Administered 2022-06-28: 2 mg via INTRAVENOUS
  Filled 2022-06-27: qty 1

## 2022-06-27 MED ORDER — GLYCOPYRROLATE 0.2 MG/ML IJ SOLN
0.2000 mg | INTRAMUSCULAR | Status: DC | PRN
  Filled 2022-06-27: qty 1

## 2022-06-27 NOTE — Progress Notes (Signed)
NAME:  Maghen Group, MRN:  [id number], DOB:  [birth year], LOS: 1 ADMISSION DATE:  06/26/2022, CONSULTATION DATE:  06/25/22 REFERRING MD:  Sarita Haver, CHIEF COMPLAINT:  Change in mental status   History of Present Illness:  83 y/o female with a recent history of strokes in December was in CIR this evening when she had a sudden change in mental status and head imaging confirmed new Percheron infarcts medial thalami and new inferior right cerebellar strokes.  PCCM consulted given change in mental status and concern for airway protection. The patient could not provide history on my evaluation due to the change in her mental status.  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 Burt Knack & Dr. Cyndia Bent   Severe aortic stenosis    Sinus bradycardia    Stroke (cerebrum) ([place])     Significant Hospital Events: Including procedures, antibiotic start and stop dates in addition to other pertinent events   12/3 MRI brain > scattered tiny acute infarcts suggesting central embolic disease  81/82 IR cerebral angiogram > occlusion of intracranial left ICA at communicating segment with near occlusive and chronica appearing stenosis of bilateral P1/PCA segments> thrombectomy performed successfully 1/18 MRI brain > new new Percheron infarcts medial thalami and new inferior right  cerebellar strokes 1/18 called for change in mental status due to new strokes as above  Interim History / Subjective:  Still sleepy, HRs remain in 30s.  Family meeting with palliative care this afternoon.  Objective   Blood pressure (!) 192/57, pulse (!) 33, temperature (!) 97.5 F (36.4 C), temperature source Axillary, resp. rate 20, height 5\' 3"  (1.6 m), weight 82.4 kg, SpO2 100 %.        Intake/Output Summary (Last 24 hours) at 06/27/2022 1005 Last data filed at 06/27/2022 0600 Gross per 24 hour  Intake 1158.74 ml  Output --  Net 1158.74 ml    Filed Weights   06/25/22 2323 06/26/22 0342 06/27/22 0500  Weight: 79.3 kg 79.3 kg 82.4 kg    Examination:  General: ill-appearing woman lying in bed in NAD Neuro: sleeping, occasionally wet-sounding cough. Withdraws and winces with sternal rub. No response to verbal stimulation. HEENT: [place]/AT, eyes anicteric Cardiovascular: S1S2, bradycardic, reg rhythm  Lungs: rhonchi bilaterally, no tachypnea or accessory muscle use Abdomen: soft, NT Musculoskeletal: no edema, no cyanosis Skin: warm, dry, no diffuse rashes  BUN 15 Cr 0.87 WBC 9.4 H/H 10.9/33.3 Platelets 192 A1c 5.9   Assessment & Plan:  Acute respiratory failure with hypoxia  Likely ongoing aspiration, has decent cough mechanics but likely impaired throat sensation -supplemental O2 to maintain SpO2 >90% -NTS PRN, but family have been suggesting that she would like less aggressive care measures overall. -Family meeting with palliative care at 2pm. Daughter updated at  bedside.  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 Now unable to  participate with therapies to recover from her previous strokes.  -[place] meeting this afternoon. -keppra seizure prophylaxis -permissive hypertension   Afib; currently bradycardic in 30s; has had appropriate BP response to bradycardia and appears to be perfusing well. -hold amiodarone and rate controlling meds due to bradycardia -permissive hypertension continues- do not treat HTN unless SBP >220, DBP >120 -pradaxa after DOAC failure; this would require NGT and family has expressed wanting to avoid pain for her   Hyperglycemia -con't to monitor   Anemia- stable -no need for transfusion at the present time   Hypothyroidism -synthroid on hold while no enteral access    History of constipation -miralax PRN when/ if able to take PO  History of AS, s/p TAVR -holding all cardiac meds at present    Daughter, son in law, grandson at bedside today- updated on her care. We will re-discuss during the [place] meeting this afternoon and I deferred some of her questions to this meeting so we can have a comprehensive discussion.  Best Practice (right click and "Reselect all SmartList Selections" daily)   Diet/type: NPO DVT prophylaxis: Pradaxa GI prophylaxis: N/A Lines: N/A Foley:  N/A Code Status:  full code Last date of multidisciplinary goals of care discussion [1/19, see iPAL note]   Critical care time:     This patient is critically ill with multiple organ system failure which requires frequent high complexity decision making, assessment, support, evaluation, and titration of therapies. This was completed through the application of advanced monitoring technologies and extensive interpretation of multiple databases. During this encounter critical care time was devoted to patient care services described in this note for 40 minutes.   Julian Hy, DO 06/27/22 12:35 PM [facility]  For contact information, see Amion. If no response to pager, please call PCCM  consult pager. After hours, 7PM- 7AM, please call [facility].

## 2022-06-27 NOTE — Progress Notes (Signed)
PT Cancellation Note  Patient Details Name: Rebekah Pope MRN: [id number] DOB: 1940/08/19   Cancelled Treatment:    Reason Eval/Treat Not Completed: Other (comment). Pt remains lethargic. Not following commands. Plans for [place] meeting today with possible transition to comfort care. PT to follow and proceed with eval if/when appropriate.   Lorriane Shire 06/27/2022, 12:36 PM

## 2022-06-27 NOTE — Plan of Care (Signed)
  Problem: Education: Goal: Knowledge of General Education information will improve Description: Including pain rating scale, medication(s)/side effects and non-pharmacologic comfort measures Outcome: Not Progressing   Problem: Health Behavior/Discharge Planning: Goal: Ability to manage health-related needs will improve Outcome: Not Progressing   Problem: Clinical Measurements: Goal: Ability to maintain clinical measurements within normal limits will improve Outcome: Not Progressing Goal: Will remain free from infection Outcome: Not Progressing Goal: Diagnostic test results will improve Outcome: Not Progressing Goal: Respiratory complications will improve Outcome: Not Progressing Goal: Cardiovascular complication will be avoided Outcome: Not Progressing   Problem: Activity: Goal: Risk for activity intolerance will decrease Outcome: Not Progressing   Problem: Nutrition: Goal: Adequate nutrition will be maintained Outcome: Not Progressing   Problem: Coping: Goal: Level of anxiety will decrease Outcome: Not Progressing   Problem: Elimination: Goal: Will not experience complications related to bowel motility Outcome: Not Progressing Goal: Will not experience complications related to urinary retention Outcome: Not Progressing   Problem: Pain Managment: Goal: General experience of comfort will improve Outcome: Not Progressing   Problem: Safety: Goal: Ability to remain free from injury will improve Outcome: Not Progressing   Problem: Skin Integrity: Goal: Risk for impaired skin integrity will decrease Outcome: Not Progressing   Problem: Education: Goal: Understanding of CV disease, CV risk reduction, and recovery process will improve Outcome: Not Progressing Goal: Individualized Educational Video(s) Outcome: Not Progressing   Problem: Activity: Goal: Ability to return to baseline activity level will improve Outcome: Not Progressing   Problem:  Cardiovascular: Goal: Ability to achieve and maintain adequate cardiovascular perfusion will improve Outcome: Not Progressing Goal: Vascular access site(s) Level 0-1 will be maintained Outcome: Not Progressing   Problem: Health Behavior/Discharge Planning: Goal: Ability to safely manage health-related needs after discharge will improve Outcome: Not Progressing   Problem: Education: Goal: Knowledge of disease or condition will improve Outcome: Not Progressing Goal: Knowledge of secondary prevention will improve (MUST DOCUMENT ALL) Outcome: Not Progressing Goal: Knowledge of patient specific risk factors will improve [place] N/A or DELETE if not current risk factor) Outcome: Not Progressing   Problem: Ischemic Stroke/TIA Tissue Perfusion: Goal: Complications of ischemic stroke/TIA will be minimized Outcome: Not Progressing   Problem: Coping: Goal: Will verbalize positive feelings about self Outcome: Not Progressing Goal: Will identify appropriate support needs Outcome: Not Progressing   Problem: Health Behavior/Discharge Planning: Goal: Ability to manage health-related needs will improve Outcome: Not Progressing Goal: Goals will be collaboratively established with patient/family Outcome: Not Progressing   Problem: Self-Care: Goal: Ability to participate in self-care as condition permits will improve Outcome: Not Progressing Goal: Verbalization of feelings and concerns over difficulty with self-care will improve Outcome: Not Progressing Goal: Ability to communicate needs accurately will improve Outcome: Not Progressing   Problem: Nutrition: Goal: Risk of aspiration will decrease Outcome: Not Progressing Goal: Dietary intake will improve Outcome: Not Progressing

## 2022-06-27 NOTE — Consult Note (Signed)
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   Palliative care asked to get involved to further aid in support goals of care conversations.  Today's Discussion 06/27/2022  *Please note that this is a verbal dictation therefore any spelling or grammatical errors are due to the "[employer] One" system interpretation.  Chart reviewed inclusive of vital signs, progress notes, laboratory results, and diagnostic images.   I met with patient's spouse Delfino Lovett, son Elta Guadeloupe, son Edd Arbour, daughter [name] this late afternoon.  A description of patient's acute on chronic illness was held.  Patient's family understand that she has suffered multiple strokes and that the most recent one will likely leave her in a more somnolent state for an indefinite period of time.  Prior to coming back to the acute care unit Jasiel was at [facility] though is making very little progress in terms of mobility and function.  Reviewed that unfortunately after suffering another stroke on top of prior strokes your physicality, mental state, emotional state are all depleted significantly.   Patient's family vocalizes understanding that Jordanne will not be the same person she was before nor will she be independent.  Per her prior wishes she would have never wanted artificial nutrition, life support, to live in a facility.  Unfortunately if we are to pursue aggressive measures this will be the long-term outlook of her life which her family does understand.  Alternatively patient's family agree that she would never want to suffer or be in a prolonged state of unwell.  The idea of comfort oriented care was brought up and described in detail. We talked about transition to comfort measures in house and  what that would entail inclusive of medications to control pain, dyspnea, agitation, nausea, itching, and hiccups.   We discussed stopping all uneccessary measures such as cardiac monitoring, blood draws, needle sticks, and frequent vital signs.   Initially there was some disagreement amongst family members as there was question as to whether or not we should give her more time or allow her to be made comfortable and go to inpatient hospice.    Patient's spouse Delfino Lovett determined that she would not want to live like this and shared that we are delaying the inevitable if we are to give her more time.  Richard had made the decision to make her comfortable with the plan for transition to [facility] once a bed is available.  Questions and concerns addressed/Palliative Support Provided.   Objective Assessment: Vital Signs Vitals:   06/27/22 1200 06/27/22 1309  BP: (!) 203/176 (!) 200/86  Pulse: (!) 31 (!) 34  Resp: 19 18  Temp: 97.7 F (36.5 C)   SpO2: 99% 99%    Intake/Output Summary (Last 24 hours) at 06/27/2022 1347 Last data filed at 06/27/2022 0600 Gross per 24 hour  Intake 908.83 ml  Output --  Net 908.83 ml   Last Weight  Most recent update: 06/27/2022  5:04 AM    Weight  82.4 kg (181 lb 10.5 oz)            Gen: Elderly Caucasian female in no acute distress HEENT: moist mucous membranes CV: Irregular rate and rhythm PULM: On 2 L nasal cannula breathing is even and nonlabored ABD: soft/nontender EXT: Right foot  edema Neuro: Somnolent  SUMMARY OF RECOMMENDATIONS   DNAR/DNI  Proceed with comfort measures  Have scheduled morphine and Ativan around-the-clock  Plan for transition to [facility] hospice once a bed becomes available  PMT will continue to follow and provide symptomatic support  Billing based on MDM: High ______________________________________________________________________________________ [place] Team Team  Cell Phone: [phone] Please utilize secure chat with additional questions, if there is no response within 30 minutes please call the above phone number  Palliative Medicine Team providers are available by phone from 7am to 7pm daily and can be reached through the team cell phone.  Should this patient require assistance outside of these hours, please call the patient's attending physician.

## 2022-06-27 NOTE — Discharge Summary (Signed)
. Physician Discharge Summary  Patient ID: Rebekah Pope MRN: [id number] DOB/AGE: May 10, 1941 82 y.o.  Admit date: 06/26/2022 Discharge date: 06/28/2022  Admission Diagnoses: acute stroke-- thalamic   Discharge Diagnoses:  Principal Problem:   Acute arterial ischemic stroke, vertebrobasilar, thalamic, unspecified laterality [facility]) Active Problems:   Hospice care patient   Palliative care patient Acute respiratory failure with hypoxia Acute encephalopathy due to strokes Subacute L ICE territory strokes s/p mechanical thrombectomy Paroxysmal Afib Bradycardia Hypertension Hyperglycemia Anemia Hypothyroidism Constipation History of AS, s/p TAVR Palliative care Hospice care patient  Discharged Condition: serious  Hospital Course:  From 1/19 discharge summary from rehab: Hospital Course: Rebekah Pope was admitted to rehab 06/26/2022 for inpatient therapies to consist of PT, ST and OT at least three hours five days a week. Past admission physiatrist, therapy team and rehab RN have worked together to provide customized collaborative inpatient rehab.  Pertaining to patient's bilateral left greater than right acute scattered punctate infarct due to left ICA and MCA occlusion status post revascularization as well as history of ischemic cerebrovascular accident December 2023-embolic stroke DOAC failure.  Patient remained on Eliquis for both CVA prophylaxis history of PAF.  Bouts of bradycardia with cardiology continuing to follow no current plans for pacemaker discussed with family.  Patient remained on amiodarone.  CKD baseline creatinine 1.20-1.30 latest creatinine 1.04.  Synthroid ongoing for hypothyroidism.  Patient with history of diastolic congestive heart failure maintained on Entresto exhibiting no signs of fluid overload latest chest x-ray unremarkable.  Lipitor for hyperlipidemia as indicated.  Hospital course complicated by RSV on droplet precautions monitoring of oxygen  saturations with precautions discontinued 1/15.  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  On the afternoon of 06/25/2022 while patient sitting up in chair with husband at bedside became unresponsive.  Patient placed back in the bed.  Blood pressure maintaining oxygen saturation 97% chest x-ray unremarkable cranial CT scan showed no acute changes.  There was question of possible seizure with neurology services consulted and EEG was ordered patient was loaded with Keppra.  Plans for follow-up MRI at that time ordered per neurology services.  Throughout the evening patient having difficulty with decreasing mental status inability to maintain airway with critical care medicine consulted and patient was discharged to acute care service for ongoing care.  MRI results of 06/25/2022 did show new from prior MRI of 06/05/2022 symmetric acute infarcts within the medial thalami as well as subcentimeter acute infarct within the inferior right cerebellar hemisphere.  EEG was pending patient did remain on Eliquis therapy at time of discharge to acute care services   Blood pressures were monitored on TID basis and monitored   Rehab course: During patient's stay in rehab weekly team conferences were held to monitor patient's progress, set goals and discuss barriers to discharge. At admission, patient required moderate assist step pivot transfers minimal assist supine to sit moderate assist sit to supine   He/She  has had improvement in activity tolerance, balance, postural control as well as ability to compensate for deficits. He/She has had improvement in functional use RUE/LUE  and RLE/LLE as well as improvement in awareness.  Latest therapy notes sit to stand moderate assist ambulates minimal assist and moderate motivation provided.  Completed squat pivot transfers to bedside commode over toilet moderate  assist and moderate cues for technique and body positioning.  Max assist to doff pants and  partial stand.  Patient completes handwashing at sink side grooming hygiene task with assistance.  Follow-up speech therapy patient was able to select the word to match his definition given field of 3 written choices 100% accuracy given moderate assist verbal and written cues this was established 06/23/2022.      During her ICU course she was monitored closely due to concern for aspiration. She did not require intubation but likely was having aspiration episodes that triggered coughing fits. She was treated with supplemental O2.  Due to severe bradycardia her amiodarone was discontinued. Permissive HTN was followed, allowing her BP to remain elevated to allow for cerebral perfusion. Due to severe bradycardia, HTN treatment would need to be minimal due to high risk of decompensation from a cardiac standpoint since she may not be able to mount a tachycardic response to vasodilation.  Due to inability to take oral medications and family's thoughts that she would not want care that caused discomfort, NGT was not placed to allow for pradaxa administration.   Family discussions on 1/19 and 1/20 have been consistent that she would not want life support or to be dependent on others. Family is planning on maintaining her comfort and is planning on discharge to hospice.   Consults: pulmonary/intensive care, neurology, and palliative medicine  Significant Diagnostic Studies:  MRI brain: IMPRESSION: 1. New from the prior brain MRI of 06/05/2022, there are symmetric acute infarcts within the medial thalami (which are suspicious for artery of Percheron infarcts). 2. Subcentimeter acute infarct within the inferior right cerebellar hemisphere, also new from the prior MRI. 3. Known small subacute infarcts within the supratentorial brain (involving multiple vascular territories). Overall, the constellation of findings is  highly suspicious for an embolic process. Additionally, there is a new subcentimeter focus of susceptibility-weighted signal loss within the left frontal lobe which is suspicious for a tiny embolic hemorrhage. 4. Background parenchymal atrophy, chronic small vessel disease and chronic infarcts as described. 5. Paranasal sinus disease, as outlined.     Latest Ref Rng & Units 06/27/2022    3:00 AM 06/26/2022    7:57 AM 06/25/2022    3:18 PM  BMP  Glucose 70 - 99 mg/dL 91  109  116   BUN 8 - 23 mg/dL 15  15  17    Creatinine 0.44 - 1.00 mg/dL 0.87  1.06  1.04   Sodium 135 - 145 mmol/L 138  141  138   Potassium 3.5 - 5.1 mmol/L 3.8  3.8  3.9   Chloride 98 - 111 mmol/L 107  107  104   CO2 22 - 32 mmol/L 23  28  26    Calcium 8.9 - 10.3 mg/dL 8.6  8.8  8.7       Latest Ref Rng & Units 06/27/2022    3:00 AM 06/26/2022    7:50 AM 06/25/2022    3:18 PM  CBC  WBC 4.0 - 10.5 K/uL 9.4  11.3  7.7   Hemoglobin 12.0 - 15.0 g/dL 10.9  11.4  11.7   Hematocrit 36.0 - 46.0 % 33.3  34.5  34.8   Platelets 150 - 400 K/uL 192  211  216      Treatments:  Keppra for seizure prophylaxis Meds to maintain comfort   Discharge Exam: Blood pressure (!) 211/47, pulse (!) 40, temperature 97.8 F (36.6 C), temperature source Axillary, resp. rate 20, height 5\' 3"  (1.6 m), weight 82.4 kg, SpO2 94 %. General: elderly woman lying peacefully in bed sleeping Neuro: minimally  arousable to stimulation, moving extremities some but not following commands Cardio: bradycardic, reg rhythm Resp: breathing comfortably on [place], faint rhonchi, improved from previous exam Abd: soft, NT Extremities: no c/c/e Derm: warm, dry  Disposition:  Discharge disposition: 51-Hospice/Medical Facility       Discharge Instructions     Diet - low sodium heart healthy   Complete by: As directed    Diet general   Complete by: As directed    Increase activity slowly   Complete by: As directed       Allergies as of 06/28/2022    No Known Allergies      Medication List     STOP taking these medications    amiodarone 200 MG tablet Commonly known as: PACERONE   apixaban 5 MG Tabs tablet Commonly known as: ELIQUIS   atorvastatin 80 MG tablet Commonly known as: LIPITOR   Entresto 24-26 MG Generic drug: sacubitril-valsartan   hydrALAZINE 25 MG tablet Commonly known as: APRESOLINE   levothyroxine 112 MCG tablet Commonly known as: SYNTHROID       TAKE these medications    acetaminophen 325 MG tablet Commonly known as: TYLENOL Take 2 tablets (650 mg total) by mouth every 4 (four) hours as needed for mild pain (temp > 101.5). What changed:  when to take this reasons to take this   glycopyrrolate 1 MG tablet Commonly known as: ROBINUL Take 1 tablet (1 mg total) by mouth every 4 (four) hours as needed (excessive secretions).   glycopyrrolate 0.2 MG/ML injection Commonly known as: ROBINUL Inject 1 mL (0.2 mg total) into the skin every 4 (four) hours as needed (excessive secretions).   glycopyrrolate 0.2 MG/ML injection Commonly known as: ROBINUL Inject 1 mL (0.2 mg total) into the vein every 4 (four) hours as needed (excessive secretions).   LORazepam 2 MG/ML injection Commonly known as: ATIVAN Inject 0.5 mLs (1 mg total) into the vein every 8 (eight) hours.   LORazepam 2 MG/ML injection Commonly known as: ATIVAN Inject 0.25-0.5 mLs (0.5-1 mg total) into the vein every hour as needed for anxiety, seizure or sedation.   morphine (PF) 2 MG/ML injection Inject 0.5-1 mLs (1-2 mg total) into the vein every hour as needed (Pain/SOB).   morphine (PF) 2 MG/ML injection Inject 1 mL (2 mg total) into the vein every 8 (eight) hours.   ondansetron 4 MG/2ML Soln injection Commonly known as: ZOFRAN Inject 2 mLs (4 mg total) into the vein every 6 (six) hours as needed for nausea.         Signed: Steffanie Dunn 06/28/2022, 11:09 AM

## 2022-06-27 NOTE — Progress Notes (Addendum)
STROKE TEAM PROGRESS NOTE   ATTENDING NOTE: I reviewed above note and agree with the assessment and plan. Pt was seen and examined.   82 year old female with history of SSS, bradycardia, PAF on Eliquis, hypertension, hyperlipidemia, CAD, CHF, status post TAVR 02/2022 stroke in 05/2022 admitted from CIR for altered mental status, speech arrest, lethargy.  CT no acute abnormality.  MRI showed right cerebellum punctate infarct, bilateral thalamic infarcts.  EEG no seizure, still bradycardic and 30s.  LDL 47, A1c 5.9.  Creatinine 1.06, WBC 11.3.  Patient has stroke on 95/11/2128 showed embolic shower, Xarelto changed to Eliquis.  Again stroke on 06/04/2022 with left ICA and MCA occlusion status post IR with TICI3 reperfusion.  MRI shows scattered bilateral small infarcts.  CTA head and neck also showed bilateral PCA proximal chronic occlusion.  Current bilateral thalamic infarct likely due to chronic bilateral PCA proximal chronic occlusion.  She was put on Keppra initially for lethargy with concerning for subclinical seizure but now discontinued.  Initially plan to transition Eliquis to Pradaxa, however, palliative care service on board, discussed with family and family requested comfort care measures which is appropriate.  For detailed assessment and plan, please refer to above/below as I have made changes wherever appropriate.   Neurology will sign off. Please call with questions. Thanks for the consult.   Rosalin Hawking, MD PhD Stroke Neurology 06/27/2022 7:15 PM    INTERVAL HISTORY Patient is seen in her room with no family at the bedside.  Overnight, her vital signs have been stable although she still remains bradycardic.  She is a little more responsive to stimuli this morning but still does not open her eyes or follow commands.  Goals of care conversation is planned for 1400.  Patient does not have enteral access and was unable to get her Xarelto yesterday.  Vitals:   06/27/22 0730 06/27/22  0800 06/27/22 0900 06/27/22 1000  BP: (!) 180/53 (!) 188/51 (!) 177/64 (!) 194/73  Pulse: (!) 34 (!) 32 (!) 33 (!) 32  Resp: 20 18 17 18   Temp:   (!) 97.5 F (36.4 C)   TempSrc:   Axillary   SpO2: 100% 100% 97% 99%  Weight:      Height:       CBC:  Recent Labs  Lab 06/26/22 0750 06/27/22 0300  WBC 11.3* 9.4  HGB 11.4* 10.9*  HCT 34.5* 33.3*  MCV 89.1 90.7  PLT 211 865    Basic Metabolic Panel:  Recent Labs  Lab 06/26/22 0757 06/27/22 0300  NA 141 138  K 3.8 3.8  CL 107 107  CO2 28 23  GLUCOSE 109* 91  BUN 15 15  CREATININE 1.06* 0.87  CALCIUM 8.8* 8.6*  MG 2.0 2.0  PHOS 4.3 3.9    Lipid Panel:  Recent Labs  Lab 06/27/22 0300  CHOL 114  TRIG 105  HDL 46  CHOLHDL 2.5  VLDL 21  LDLCALC 47   HgbA1c:  Recent Labs  Lab 06/27/22 0300  HGBA1C 5.9*   Urine Drug Screen: No results for input(s): "LABOPIA", "COCAINSCRNUR", "LABBENZ", "AMPHETMU", "THCU", "LABBARB" in the last 168 hours.  Alcohol Level No results for input(s): "ETH" in the last 168 hours.  IMAGING past 24 hours No results found.  PHYSICAL EXAM General: Somnolent, ill-appearing elderly patient in no acute distress Respiratory: Regular, unlabored respirations on supplemental O2 Cardiovascular: Sinus bradycardia in the 30s seen on monitor Neurological:: Eyes are closed.  Does not open eyes even with vigorous stimulation.  Patient  will moan but does not make recognizable words.  Does not follow any commands.  Pupils sluggish but reactive, positive oculocephalic reflex, somnolent, responds more to light or stimuli than yesterday, will localize noxious stimuli with left upper extremity, will extend right upper extremity to noxious stimuli and will withdraw bilateral lower extremities to noxious stimuli  ASSESSMENT/PLAN [name] is a 82 y.o. female with history of CAD, CHF, CKD stage IIIb, complex regional pain syndrome, hypertension, hiatal hernia, hyperlipidemia, hypothyroidism,  atrial fibrillation on Eliquis, TAVR and recent stroke presenting after sheshe suddenly stopped talking and responding to stimuli while at rehab yesterday.  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  She is currently somnolent and responds only to vigorous noxious stimuli.  Given recurrent stroke on Eliquis, will switch to Pradaxa when enteral access achieved and pending goals of care discussion.  After preliminary goals of care discussion, patient was made DNR/DNI, with possible transition to comfort care if her condition declines.  Goals of care discussions are ongoing, currently awaiting arrival of her daughter from out of state.  Stroke: Artery of Percheron stroke and right cerebellar stroke Etiology: Likely embolic in setting of atrial fibrillation on Eliquis CT head No acute abnormality. Small vessel disease. Atrophy.   MRI symmetric acute infarcts in bilateral thalami, acute infarct in inferior right cerebellar hemisphere MRA pending 2D Echo EF 60 to 65%, mild concentric LVH, normal left atrial size, no atrial level shunt LDL 51 HgbA1c 6.0 VTE prophylaxis -fully anticoagulated with Pradaxa Eliquis (apixaban) daily prior to admission, was on Pradaxa (dabigatran) twice a day when enteral access achieved. Now off for comfort care measures. Therapy recommendations: Pending Disposition: Pending  Atrial fibrillation Patient has history of A-fib, was taking Eliquis previously Was taking amiodarone at home 100 mg daily Now off AC for comfort care measures  Bradycardia Patient's heart rate remains in the 30s, blood pressure remains stable Avoid beta-blockers  Hypertension Home meds: Hydralazine 25 mg 3 times daily Stable  Hyperlipidemia Home meds: Atorvastatin 80 mg daily LDL 51, goal < 70  Other Stroke Risk Factors Advanced Age >/= 69  Obesity, Body mass index is 32.18 kg/m., BMI >/= 30 associated with increased stroke risk, recommend weight  loss, diet and exercise as appropriate  Hx stroke/ Coronary artery disease Congestive heart failure  Other Active Problems Hypothyroidism-continue home Synthroid when enteral access achieved  Hospital day # [place] , MSN, AGACNP-BC [facility] See Amion for schedule and pager information 06/27/2022 11:36 AM   To contact Stroke Continuity provider, please refer to [email or web address] After hours, contact General Neurology

## 2022-06-27 NOTE — TOC Initial Note (Signed)
Transition of Care [facility]) - Initial/Assessment Note    Patient Details  Name: Rebekah Pope MRN: [id number] Date of Birth: 09-Mar-1941  Transition of Care [facility]) CM/SW Contact:    Emeterio Reeve, LCSW Phone Number: 06/27/2022, 2:11 PM  Clinical Narrative:                  CSW was informed by palliative team that pt is comfort care and family is interested in [facility]. [facility] reports they do not have a bed today. They will reach out to family when a bed is available.   Expected Discharge Plan: Hospice Medical Facility Barriers to Discharge: Other (must enter comment) (Waiting for hospice bed)   Patient Goals and CMS Choice            Expected Discharge Plan and Services                                              Prior Living Arrangements/Services     Patient language and need for interpreter reviewed:: Yes        Need for Family Participation in Patient Care: Yes (Comment) Care giver support system in place?: No (comment)   Criminal Activity/Legal Involvement Pertinent to Current Situation/Hospitalization: No - Comment as needed  Activities of Daily Living      Permission Sought/Granted   Permission granted to share information with : Yes, Verbal Permission Granted     Permission granted to share info w [facility]ENC[facility]        Emotional Assessment Appearance:: Appears stated age Attitude/Demeanor/Rapport: Unable to Assess Affect (typically observed): Unable to Assess   Alcohol / Substance Use: Not Applicable Psych Involvement: No (comment)  Admission diagnosis:  Acute arterial ischemic stroke, vertebrobasilar, thalamic, unspecified laterality (HCC) [I63.81] Patient Active Problem List   Diagnosis Date Noted   Altered mental status 06/26/2022   Acute arterial ischemic stroke, vertebrobasilar, thalamic, unspecified laterality ([place]) 06/25/2022   Left middle cerebral artery stroke ([place])  06/11/2022   CKD (chronic kidney disease) stage 3, GFR 30-59 ml/min ([place]) 06/08/2022   (HFpEF) heart failure with preserved ejection fraction ([place]) 06/08/2022   CAD (coronary artery disease) of artery bypass graft 06/08/2022   Sinus bradycardia 06/08/2022   Acute ischemic stroke ([place]) 06/04/2022   Ischemic cerebrovascular accident (CVA) ([place]) 05/12/2022   Elevated troponin 05/10/2022   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   Severe aortic stenosis 10/13/2021   Abnormal electrocardiogram (ECG) (EKG) 10/09/2021   Allergic rhinitis 10/09/2021   Essential (primary) hypertension 10/09/2021   Hyperlipidemia 10/09/2021   Hypothyroidism 10/09/2021   Paroxysmal atrial fibrillation ([place]) 10/09/2021   PCP:  Rhea Bleacher, NP Pharmacy:   [facility] [place] [place] [address] Phone: [phone] Fax: [phone]     Social Determinants of Health (SDOH) Social History: SDOH Screenings   Food Insecurity: No Food Insecurity (06/08/2022)  Housing: Low Risk  (06/08/2022)  Transportation Needs: No Transportation Needs (06/08/2022)  Utilities: Not At Risk (06/08/2022)  Depression (PHQ2-9): Low Risk  (06/03/2022)  Tobacco Use: Low Risk  (06/11/2022)   SDOH Interventions:     Readmission Risk Interventions    02/11/2022   11:09 AM  Readmission Risk Prevention Plan  Post Dischage Appt Complete  Medication Screening Complete  Transportation  Screening Complete

## 2022-06-28 DIAGNOSIS — Z515 Encounter for palliative care: Secondary | ICD-10-CM | POA: Diagnosis not present

## 2022-06-28 MED ORDER — GLYCOPYRROLATE 0.2 MG/ML IJ SOLN: 0.2000 mg | INTRAMUSCULAR | 0 refills | Status: AC | PRN

## 2022-06-28 MED ORDER — ACETAMINOPHEN 325 MG PO TABS: 650.0000 mg | ORAL_TABLET | ORAL | 0 refills | Status: AC | PRN

## 2022-06-28 MED ORDER — MORPHINE SULFATE (PF) 2 MG/ML IV SOLN: 1.0000 mg | INTRAVENOUS | 0 refills | Status: AC | PRN

## 2022-06-28 MED ORDER — MORPHINE SULFATE (PF) 2 MG/ML IV SOLN: 2.0000 mg | Freq: Three times a day (TID) | INTRAVENOUS | 0 refills | Status: AC

## 2022-06-28 MED ORDER — LORAZEPAM 2 MG/ML IJ SOLN: 0.5000 mg | INTRAMUSCULAR | 0 refills | Status: AC | PRN

## 2022-06-28 MED ORDER — LORAZEPAM 2 MG/ML IJ SOLN: 1.0000 mg | Freq: Three times a day (TID) | INTRAMUSCULAR | 0 refills | Status: AC

## 2022-06-28 MED ORDER — ONDANSETRON HCL 4 MG/2ML IJ SOLN: 4.0000 mg | Freq: Four times a day (QID) | INTRAMUSCULAR | 0 refills | Status: AC | PRN

## 2022-06-28 MED ORDER — GLYCOPYRROLATE 1 MG PO TABS: 1.0000 mg | ORAL_TABLET | ORAL | 0 refills | Status: AC | PRN

## 2022-06-28 NOTE — Progress Notes (Signed)
[place] form, prescriptions signed and placed on paper chart.  Julian Hy, DO 06/28/22 11:16 AM [facility]

## 2022-06-28 NOTE — TOC Transition Note (Signed)
Transition of Care [facility]) - CM/SW Discharge Note   Patient Details  Name: Rebekah Pope MRN: [id number] Date of Birth: 1941/04/24  Transition of Care [facility]) CM/SW Contact:  Rodney Booze, LCSW Phone Number: 06/28/2022, 10:20 AM   Clinical Narrative:     Patient was accepted to [facility], Patient will DC there today. 06-28-22 TOC will continue to follow for DC.   Final next level of care: [place] Barriers to Discharge: No Barriers Identified   Patient Goals and CMS Choice CMS Medicare.gov Compare Post Acute Care list provided to:: Patient Choice offered to / list presented to : Patient  Discharge Placement                         Discharge Plan and Services Additional resources added to the After Visit Summary for                                       Social Determinants of Health (SDOH) Interventions SDOH Screenings   Food Insecurity: No Food Insecurity (06/08/2022)  Housing: Low Risk  (06/08/2022)  Transportation Needs: No Transportation Needs (06/08/2022)  Utilities: Not At Risk (06/08/2022)  Depression (PHQ2-9): Low Risk  (06/03/2022)  Tobacco Use: Low Risk  (06/11/2022)     Readmission Risk Interventions    02/11/2022   11:09 AM  Readmission Risk Prevention Plan  Post Dischage Appt Complete  Medication Screening Complete  Transportation Screening Complete

## 2022-06-28 NOTE — Progress Notes (Signed)
Patient has been transported via [facility] by [facility] to [facility] [place].

## 2022-06-28 NOTE — Progress Notes (Signed)
   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    Palliative care asked to get involved to further aid in support goals of care conversations.  Today's Discussion 06/28/2022  *Please note that this is a verbal dictation therefore any spelling or grammatical errors are due to the "[employer]" system interpretation.  Chart reviewed inclusive of vital signs, progress notes, laboratory results, and diagnostic images.   I met at bedside with Rebekah Pope this morning. She appeared to be comfortable and in NAD.   Per MAR review did require an extra dose of morphine this AM.   Plan for transition to [place] this afternoon.   No family at bedside during assessment.   Objective Assessment: Vital Signs Vitals:   06/27/22 2104 06/28/22 0515  BP: (!) 211/47   Pulse: (!) 40   Resp: (!) 22 20  Temp: 97.8 F (36.6 C)   SpO2: 94%     Intake/Output Summary (Last 24 hours) at 06/28/2022 1151 Last data filed at 06/27/2022 2106 Gross per 24 hour  Intake 169.14 ml  Output 600 ml  Net -430.86 ml   Last Weight  Most recent update: 06/27/2022  5:04 AM    Weight  82.4 kg (181 lb 10.5 oz)            Gen: Elderly Caucasian female in no acute distress HEENT: moist mucous membranes CV: Irregular rate and rhythm PULM: On 2 L nasal cannula breathing is even and nonlabored ABD: soft/nontender EXT: Right foot edema Neuro: Somnolent  SUMMARY OF RECOMMENDATIONS   DNAR/DNI   Full Comfort measures   Continue scheduled morphine and Ativan around-the-clock   Plan for transition to [place] [facility] this afternoon   Billing based on MDM: High --> review of IV  opiods ______________________________________________________________________________________ Start Palliative Medicine Team Team Cell Phone: [phone] Please utilize secure chat with additional questions, if there is no response within 30 minutes please call the above phone number  Palliative Medicine Team providers are available by phone from 7am to 7pm daily and can be reached through the team cell phone.  Should this patient require assistance outside of these hours, please call the patient's attending physician.

## 2022-06-28 NOTE — Progress Notes (Signed)
Report given to Abigail Butts at [facility] [place].

## 2022-06-28 NOTE — TOC Progression Note (Signed)
Transition of Care [facility]) - Progression Note    Patient Details  Name: Rebekah Pope MRN: [id number] Date of Birth: 24-Apr-1941  Transition of Care [facility]) CM/SW Contact  Rodney Booze, [place] Phone Number: 06/28/2022, 11:25 AM  Clinical Narrative:    CSW has called [facility] arranged for the time at 2pm. CSW has also spoke with the patients daughter to make her aware that she is leaving. CSW has reached out to provider about DNR. TOC will follow DC    Expected Discharge Plan: [place] Barriers to Discharge: No Barriers Identified  Expected Discharge Plan and Services         Expected Discharge Date: 06/28/22                                     Social Determinants of Health (SDOH) Interventions SDOH Screenings   Food Insecurity: No Food Insecurity (06/08/2022)  Housing: Low Risk  (06/08/2022)  Transportation Needs: No Transportation Needs (06/08/2022)  Utilities: Not At Risk (06/08/2022)  Depression (PHQ2-9): Low Risk  (06/03/2022)  Tobacco Use: Low Risk  (06/11/2022)    Readmission Risk Interventions    02/11/2022   11:09 AM  Readmission Risk Prevention Plan  Post Dischage Appt Complete  Medication Screening Complete  Transportation Screening Complete

## 2022-06-29 ENCOUNTER — Other Ambulatory Visit: Payer: Self-pay | Admitting: Cardiology

## 2022-07-08 ENCOUNTER — Ambulatory Visit: Payer: Medicare PPO | Admitting: Internal Medicine

## 2022-07-08 DIAGNOSIS — R001 Bradycardia, unspecified: Secondary | ICD-10-CM

## 2022-07-09 ENCOUNTER — Inpatient Hospital Stay: Payer: Medicare PPO | Admitting: Neurology

## 2022-07-09 DEATH — deceased

## 2022-07-10 ENCOUNTER — Ambulatory Visit: Payer: Medicare PPO | Admitting: Physical Medicine & Rehabilitation

## 2023-11-28 IMAGING — CT CT ANGIO CHEST
1 series · 12 of 14 positions shown · IV contrast (agent unspecified)
Comparison: None Available.

CLINICAL DATA: Preop evaluation for TAVR.

EXAM:
CTA ABDOMEN AND PELVIS WITHOUT AND WITH CONTRAST
TECHNIQUE: Multidetector CT imaging of the abdomen and pelvis was performed
using the standard protocol during bolus administration of
intravenous contrast. Multiplanar reconstructed images and MIPs were
obtained and reviewed to evaluate the vascular anatomy.

[Series 824: measurements · 12 of 14 slices shown]
[im 2/14  lung]
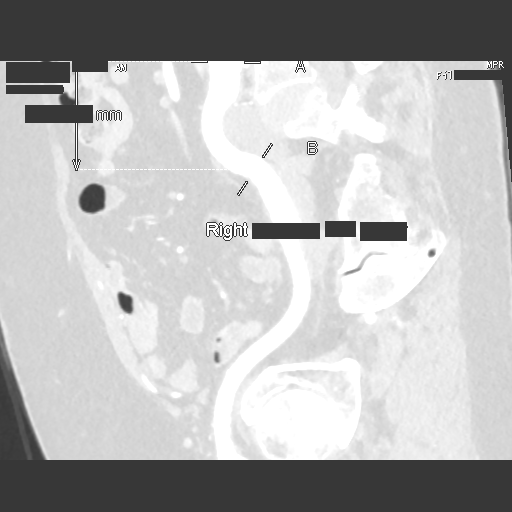
[im 3/14  mediastinal]
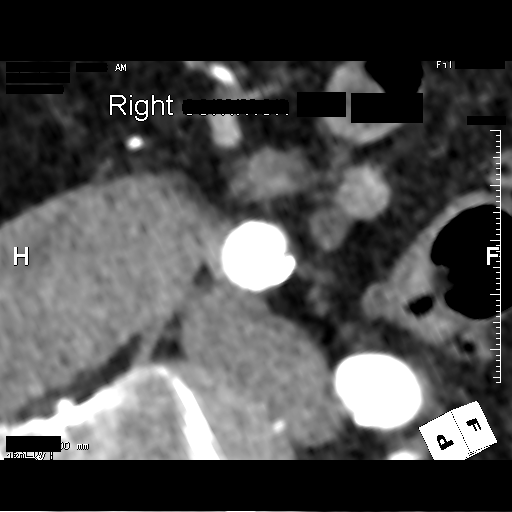
[im 4/14  lung]
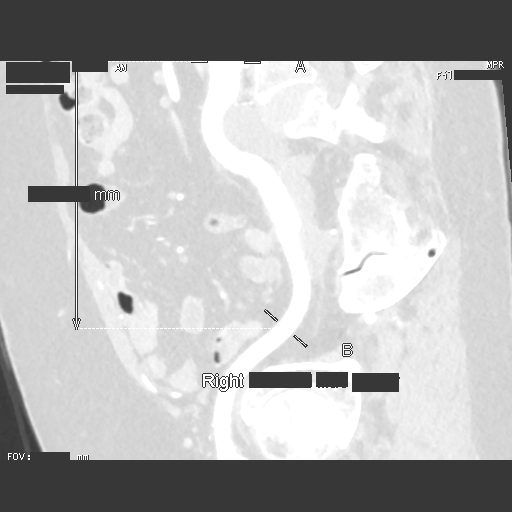
[im 5/14  mediastinal]
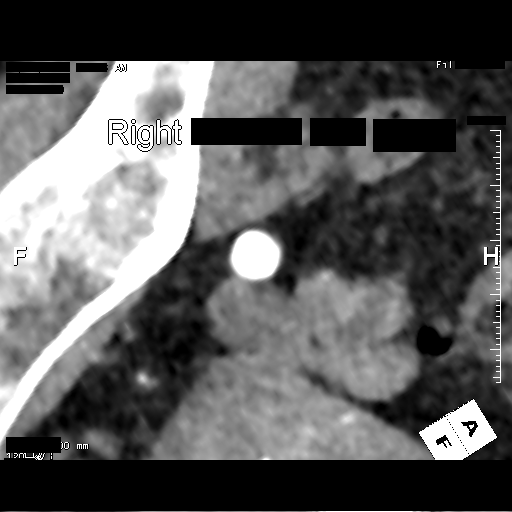
[im 6/14  lung]
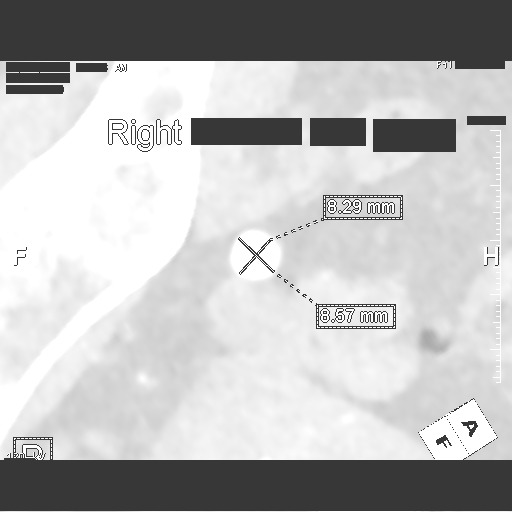
[im 7/14  mediastinal]
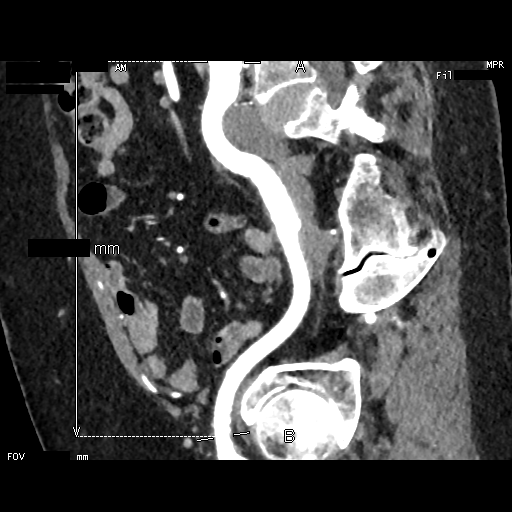
[im 8/14  lung]
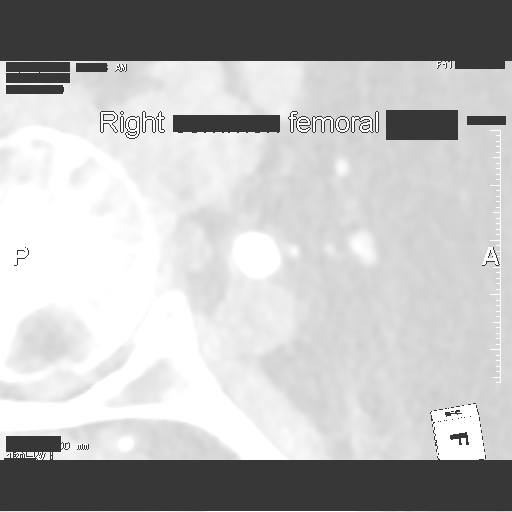
[im 9/14  mediastinal]
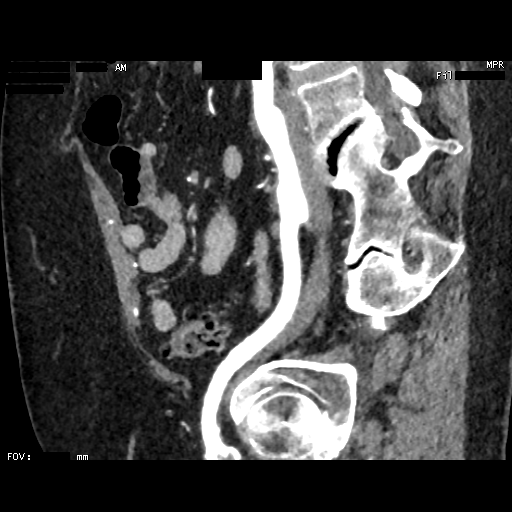
[im 10/14  lung]
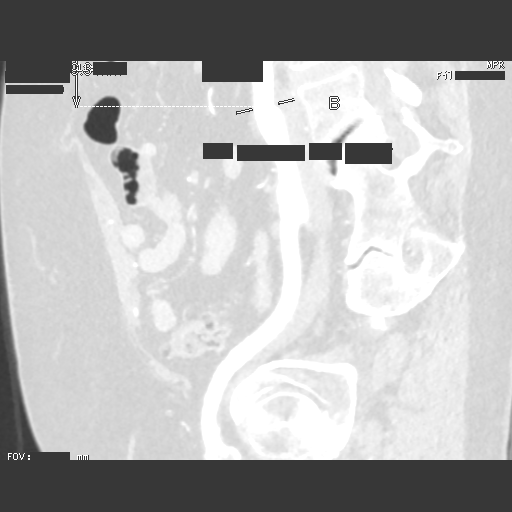
[im 11/14  mediastinal]
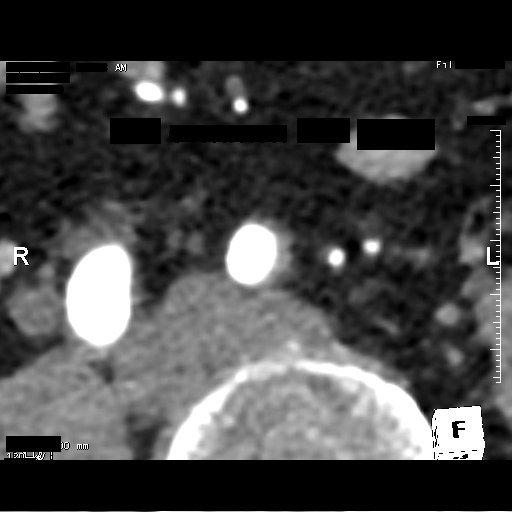
[im 12/14  lung]
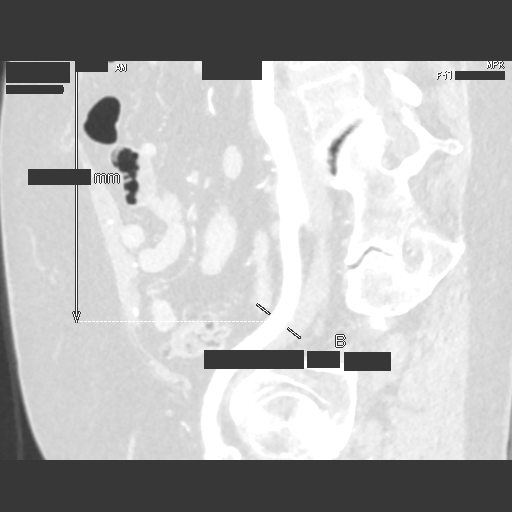
[im 13/14  mediastinal]
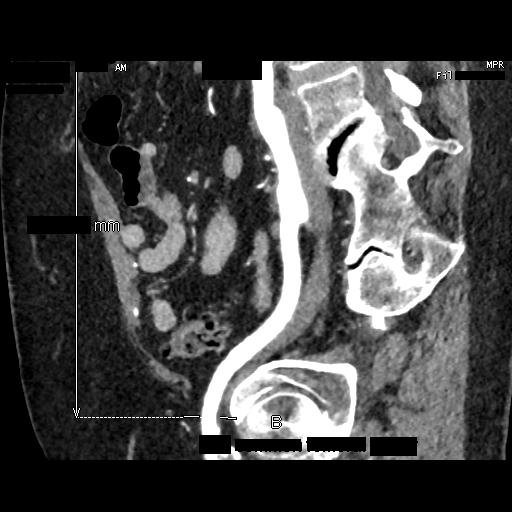

[12 of 14 positions shown; findings below may reference images not displayed]

RADIATION DOSE REDUCTION: This exam was performed according to the
departmental dose-optimization program which includes automated
exposure control, adjustment of the mA and/or kV according to
patient size and/or use of iterative reconstruction technique.

CONTRAST:  100mL OMNIPAQUE IOHEXOL 350 MG/ML SOLN
FINDINGS: CTA CHEST FINDINGS

Cardiovascular: Mild cardiomegaly. No pericardial effusion. Moderate
three-vessel coronary artery calcifications. Aortic valve
calcifications and thickening. Severe atherosclerotic disease of the
thoracic aorta. Standard three-vessel aortic arch with no
significant narrowing.

Mediastinum/Nodes: Small hiatal hernia. Thyroid is unremarkable. No
pathologically enlarged lymph nodes seen in the chest.

Lungs/Pleura: Central airways are patent. No consolidation effusion
or pneumothorax. Multiple small solid pulmonary nodules. Nodule of
the right middle lobe measuring 4 mm on series 7, image 43.
Additional nodules are seen in the superior portion of the right
lower lobe measuring 3 mm on image 43.

Musculoskeletal: No chest wall abnormality. No acute or significant
osseous findings.

CTA ABDOMEN AND PELVIS FINDINGS

Hepatobiliary: No suspicious liver lesions. Numerous gallstones, no
evidence of gallbladder wall thickening. No biliary ductal dilation.

Pancreas: Unremarkable. No pancreatic ductal dilatation or
surrounding inflammatory changes.

Spleen: Normal in size without focal abnormality.

Adrenals/Urinary Tract: Bilateral adrenal glands are unremarkable.
No hydronephrosis or nephrolithiasis. No suspicious renal lesions.
Bladder is unremarkable.

Stomach/Bowel: Stomach is within normal limits. Diverticulosis. No
evidence of bowel wall thickening, distention, or inflammatory
changes.

Vascular/lymphatic: No pathologically enlarged nodes seen in the
abdomen or pelvis. Normal caliber abdominal aorta with moderate
atherosclerotic disease. Mild narrowing at the origin of the celiac
and SMA due to calcified and noncalcified plaque IMA is patent.
Bilateral renal arteries are patent with no significant narrowing

Reproductive: Fibroid uterus with large calcified fibroid of the
uterine body measuring up to 6.8 cm.

Other: No abdominal wall hernia or abnormality. No abdominopelvic
ascites.

Musculoskeletal: No acute or significant osseous findings.

VASCULAR MEASUREMENTS PERTINENT TO TAVR:

AORTA:

Minimal Aortic Jiameter-6S.D mm

Severity of Aortic Calcification-moderate

RIGHT PELVIS:

Right Common Iliac Artery -

Minimal Aiameter-Y.C mm

Tortuosity-moderate

Calcification-mild

Right External Iliac Artery -

Minimal Miameter-9.9 mm

Tortuosity-mild

Calcification-none

Right Common Femoral Artery -

Minimal Miameter-1.M mm

Tortuosity-none

Calcification-mild

LEFT PELVIS:

Left Common Iliac Artery -

Minimal Eiameter-X.W mm

Tortuosity-severe

Calcification-mild

Left External Iliac Artery -

Minimal Piameter-D.I mm

Tortuosity-mild

Calcification-none

Left Common Femoral Artery -

Minimal Miameter-O.J mm

Tortuosity-none

Calcification-mild

Review of the MIP images confirms the above findings.
IMPRESSION: 1. Vascular findings and measurements pertinent to potential TAVR
procedure, as detailed above.
2. Severe thickening calcification of the aortic valve, compatible
with reported clinical history of severe aortic stenosis.
3. Moderate aortic atherosclerosis and mild iliac artery
atherosclerosis. Three vessel coronary artery disease.
4. Small solid pulmonary nodules, largest measures 4 mm in the right
middle lobe. No follow-up needed if patient is low-risk (and has no
known or suspected primary neoplasm). Non-contrast chest CT can be
considered in 12 months if patient is high-risk. This recommendation
follows the consensus statement: Guidelines for Management of
Incidental Pulmonary Nodules Detected on CT Images: From the

## 2023-11-28 IMAGING — CT CT HEART MORP W/ CTA COR W/ SCORE W/ CA W/CM &/OR W/O CM
1 of 2 series · 11 of 20 positions shown, 14 images · non-contrast
Comparison: None Available.
COMPARISON: None Available.

Addendum:
EXAM:
OVER-READ INTERPRETATION  CT CHEST

The following report is a limited chest CT over-read performed by
11/11/2021. This over-read does not include interpretation of cardiac
or coronary anatomy or pathology. The cardiac CTA interpretation by
the cardiologist is attached.
CLINICAL DATA: Severe Aortic Stenosis.
Cardiac TAVR CT
TECHNIQUE: A non-contrast, gated CT scan was obtained with axial slices of 3 mm
through the heart for aortic valve calcium scoring. A 120 kV
retrospective, gated, contrast cardiac scan was obtained. Gantry
rotation speed was 250 msecs and collimation was 0.6 mm.
Nitroglycerin was not given. The 3D data set was reconstructed in 5%
intervals of the 0-95% of the R-R cycle. Systolic and diastolic
phases were analyzed on a dedicated workstation using MPR, MIP, and
VRT modes. The patient received 100 cc of contrast.

[Series 822: findings · 0.26mm/px · 11 of 28 slices shown, 14 images]
[im 3/28  vessel]
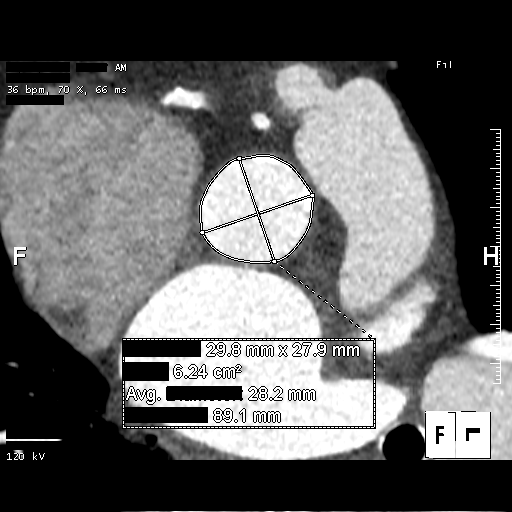
[im 3/28  lung]
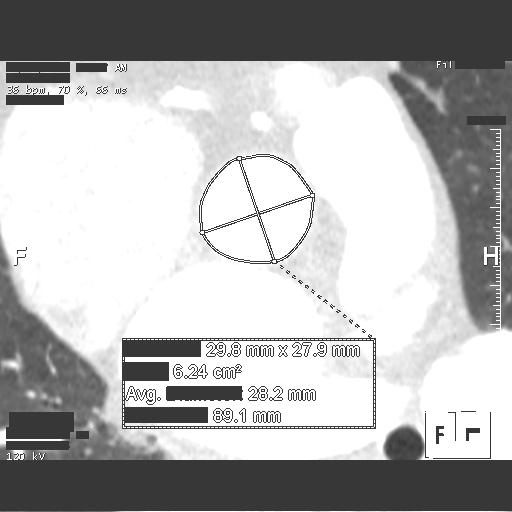
[im 5/28  vessel]
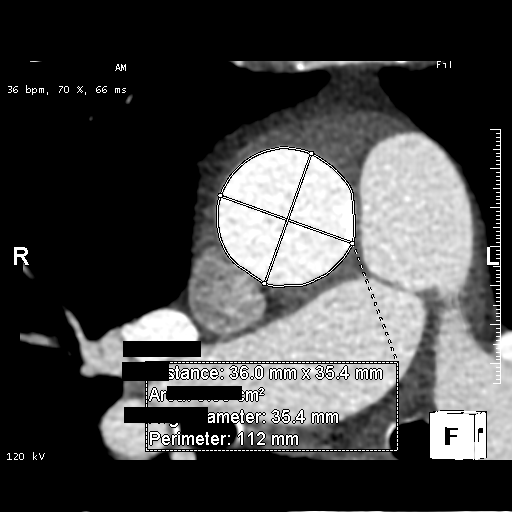
[im 7/28  vessel]
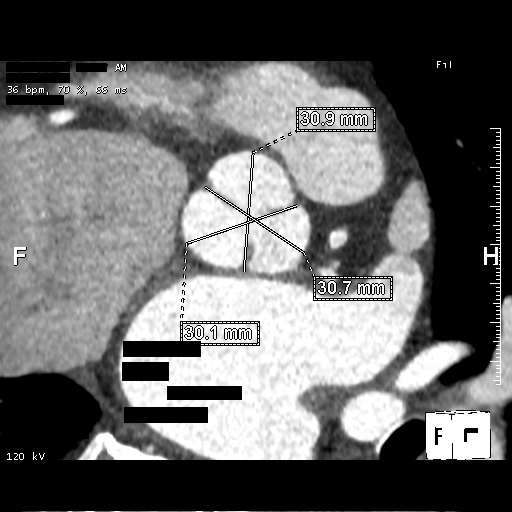
[im 9/28  vessel]
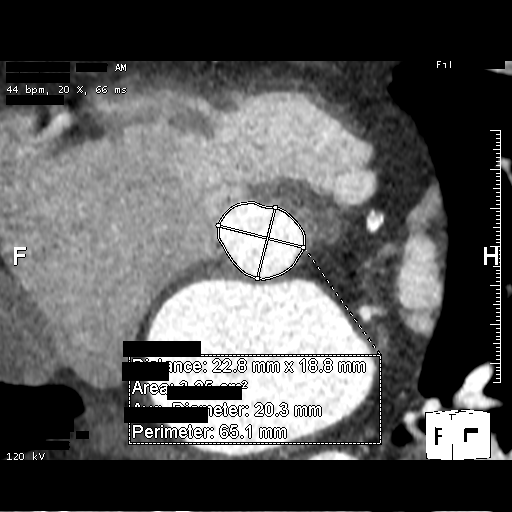
[im 11/28  vessel]
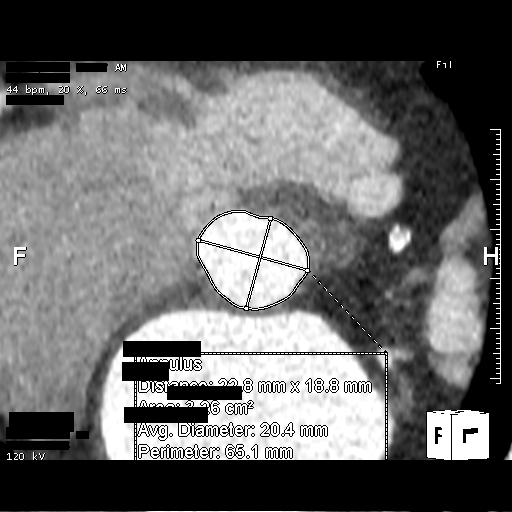
[im 11/28  lung]
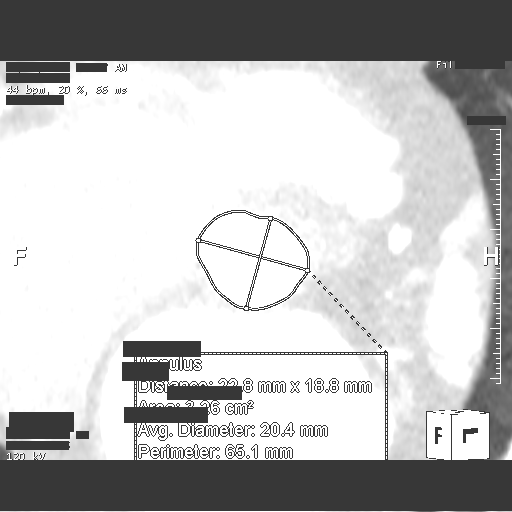
[im 13/28  vessel]
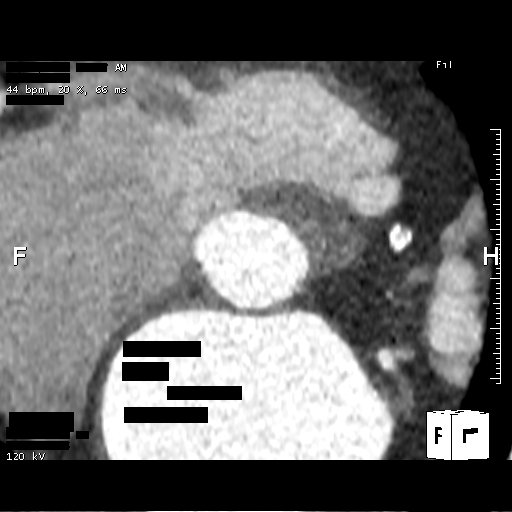
[im 15/28  vessel]
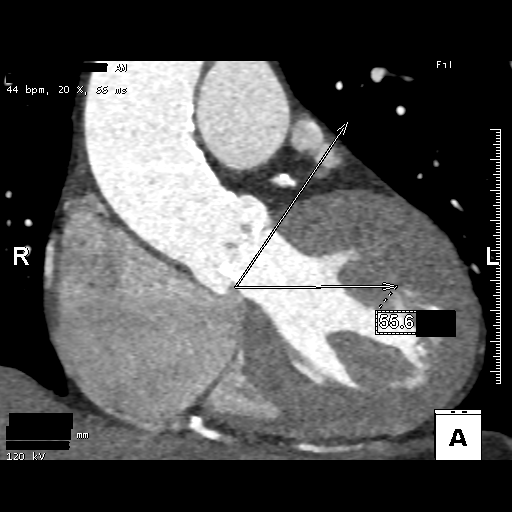
[im 17/28  vessel]
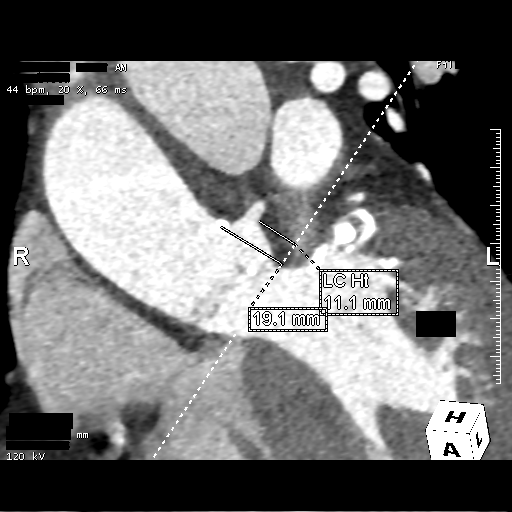
[im 19/28  vessel]
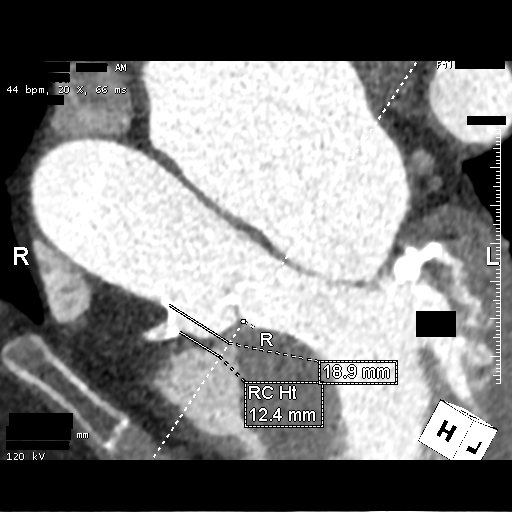
[im 19/28  lung]
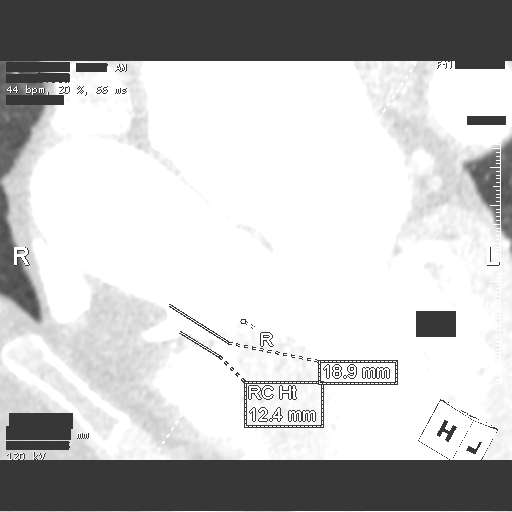
[im 21/28  vessel]
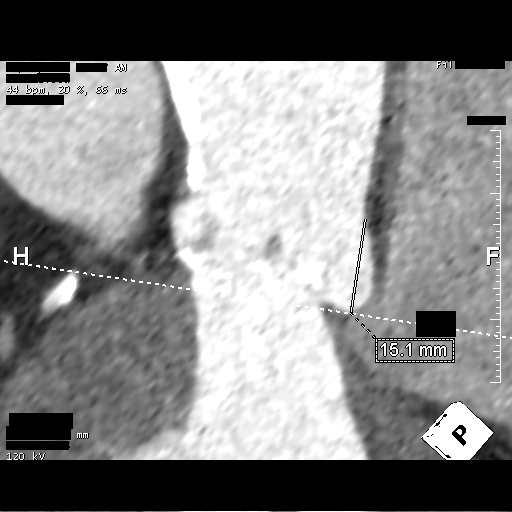
[im 23/28  vessel]
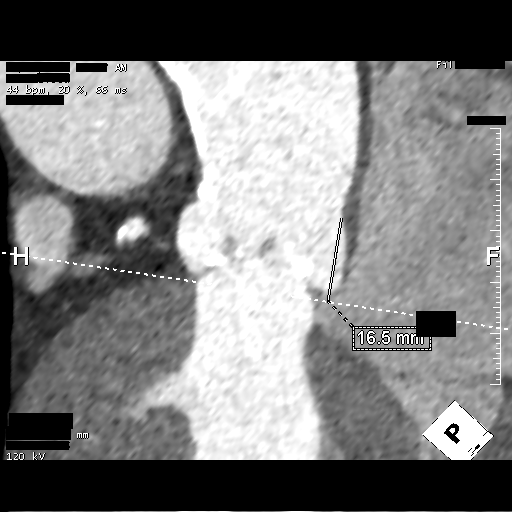

[11 of 20 positions shown; findings below may reference images not displayed]

FINDINGS: Extracardiac findings will be described separately under dictation
for contemporaneously obtained CTA chest, abdomen and pelvis.
IMPRESSION: Please see separate dictation for contemporaneously obtained CTA
chest, abdomen and pelvis dated 11/11/2021 for full description of
relevant extracardiac findings.
FINDINGS: Image quality: Excellent.

Noise artifact is: Limited.

Valve Morphology: Tricuspid aortic valve. Severe calcifications that
are diffuse. Severely restricted leaflet movement in systole.

Aortic Valve Calcium score: 1164

Aortic annular dimension:

Phase assessed: 20%

Annular area: 326 mm2

Annular perimeter: 65.1 mm

Max diameter: 22.8 mm

Min diameter: 18.8 mm

Annular and subannular calcification: None.

Membranous septum length: 5.2 mm

Optimal coplanar projection: RAO 2 CERROS 6

Coronary Artery Height above Annulus:

Left Main: 11.1 mm

Right Coronary: 12.4 mm

Sinus of Valsalva Measurements:

Non-coronary: 30 mm

Right-coronary: 31 mm

Left-coronary: 31 mm

Sinus of Valsalva Height:

Non-coronary: 16.5 mm

Right-coronary: 18.9 mm

Left-coronary: 19.1 mm

Sinotubular Junction: 29 mm with moderate calcifications.

Ascending Thoracic Aorta: 36 mm

Coronary Arteries: Normal coronary origin. Right dominance. The
study was performed without use of NTG and is insufficient for
plaque evaluation. Please refer to recent cardiac catheterization
for coronary assessment. 3-vessel coronary calcifications.

Cardiac Morphology:

Right Atrium: Right atrial size is within normal limits.

Right Ventricle: The right ventricular cavity is within normal
limits.

Left Atrium: Left atrial size is normal in size with no left atrial
appendage filling defect.

Left Ventricle: The ventricular cavity size is within normal limits.
There are no stigmata of prior infarction. There is no abnormal
filling defect.

Pulmonary arteries: Dilated suggestive of pulmonary hypertension.

Pulmonary veins: Normal pulmonary venous drainage.

Pericardium: Normal thickness with no significant effusion or
calcium present.

Mitral Valve: The mitral valve is degenerative with severe caseating
mitral annular calcium.

Extra-cardiac findings: See attached radiology report for
non-cardiac structures.
IMPRESSION: 1. Tricuspid aortic valve with severe aortic stenosis.

2. Small annular measurements noted (326 mm2). Would consider 26 mm
Evolut Pro. Aortic root and sinus heights favorable for
self-expanding valve. Moderate, nearly circumferential calcium noted
at the STJ.

3. Small membranous septum (5.2 mm).

4. No significant annular or subannular calcifications.

5. Sufficient coronary to annulus distance.

6. Optimal Fluoroscopic Angle for Delivery: RAO 2 CERROS 6

7. Dilated pulmonary artery suggestive of pulmonary hypertension.

8. Severe, caseating mitral annular calcium noted.

*** End of Addendum ***
EXAM:
OVER-READ INTERPRETATION  CT CHEST

The following report is a limited chest CT over-read performed by
11/11/2021. This over-read does not include interpretation of cardiac
or coronary anatomy or pathology. The cardiac CTA interpretation by
the cardiologist is attached.
FINDINGS: Extracardiac findings will be described separately under dictation
for contemporaneously obtained CTA chest, abdomen and pelvis.
IMPRESSION: Please see separate dictation for contemporaneously obtained CTA
chest, abdomen and pelvis dated 11/11/2021 for full description of
relevant extracardiac findings.
